# Patient Record
Sex: Male | Born: 1960 | Race: Black or African American | Hispanic: No | Marital: Single | Smoking: Current every day smoker
Health system: Southern US, Community
[De-identification: ages and names within clinical notes are randomized; demographics above are authoritative.]

## PROBLEM LIST (undated history)

## (undated) ENCOUNTER — Ambulatory Visit (HOSPITAL_COMMUNITY): Admission: EM

## (undated) DIAGNOSIS — M109 Gout, unspecified: Secondary | ICD-10-CM

## (undated) DIAGNOSIS — M1A9XX0 Chronic gout, unspecified, without tophus (tophi): Secondary | ICD-10-CM

## (undated) DIAGNOSIS — E119 Type 2 diabetes mellitus without complications: Secondary | ICD-10-CM

## (undated) DIAGNOSIS — K219 Gastro-esophageal reflux disease without esophagitis: Secondary | ICD-10-CM

## (undated) DIAGNOSIS — J439 Emphysema, unspecified: Secondary | ICD-10-CM

## (undated) DIAGNOSIS — N401 Enlarged prostate with lower urinary tract symptoms: Secondary | ICD-10-CM

## (undated) DIAGNOSIS — E785 Hyperlipidemia, unspecified: Secondary | ICD-10-CM

## (undated) DIAGNOSIS — I10 Essential (primary) hypertension: Secondary | ICD-10-CM

## (undated) DIAGNOSIS — N529 Male erectile dysfunction, unspecified: Secondary | ICD-10-CM

## (undated) DIAGNOSIS — K08109 Complete loss of teeth, unspecified cause, unspecified class: Secondary | ICD-10-CM

## (undated) DIAGNOSIS — Z86010 Personal history of colonic polyps: Secondary | ICD-10-CM

## (undated) HISTORY — DX: Hyperlipidemia, unspecified: E78.5

## (undated) HISTORY — PX: PROSTATE BIOPSY: SHX241

## (undated) HISTORY — DX: Personal history of colonic polyps: Z86.010

## (undated) HISTORY — PX: COLONOSCOPY: SHX174

## (undated) HISTORY — DX: Gastro-esophageal reflux disease without esophagitis: K21.9

## (undated) HISTORY — PX: BACK SURGERY: SHX140

---

## 1999-02-09 ENCOUNTER — Emergency Department (HOSPITAL_COMMUNITY): Admission: EM | Admit: 1999-02-09 | Discharge: 1999-02-09 | Payer: Self-pay | Admitting: Emergency Medicine

## 1999-11-14 ENCOUNTER — Emergency Department (HOSPITAL_COMMUNITY): Admission: EM | Admit: 1999-11-14 | Discharge: 1999-11-14 | Payer: Self-pay | Admitting: Emergency Medicine

## 2000-10-17 ENCOUNTER — Emergency Department (HOSPITAL_COMMUNITY): Admission: EM | Admit: 2000-10-17 | Discharge: 2000-10-17 | Payer: Self-pay | Admitting: *Deleted

## 2002-06-16 ENCOUNTER — Emergency Department (HOSPITAL_COMMUNITY): Admission: EM | Admit: 2002-06-16 | Discharge: 2002-06-16 | Payer: Self-pay | Admitting: Emergency Medicine

## 2002-07-13 ENCOUNTER — Emergency Department (HOSPITAL_COMMUNITY): Admission: EM | Admit: 2002-07-13 | Discharge: 2002-07-13 | Payer: Self-pay | Admitting: Emergency Medicine

## 2004-02-06 ENCOUNTER — Emergency Department (HOSPITAL_COMMUNITY): Admission: EM | Admit: 2004-02-06 | Discharge: 2004-02-06 | Payer: Self-pay | Admitting: Emergency Medicine

## 2006-04-29 ENCOUNTER — Encounter: Admission: RE | Admit: 2006-04-29 | Discharge: 2006-04-29 | Payer: Self-pay | Admitting: Orthopaedic Surgery

## 2006-08-28 ENCOUNTER — Inpatient Hospital Stay (HOSPITAL_COMMUNITY): Admission: RE | Admit: 2006-08-28 | Discharge: 2006-09-01 | Payer: Self-pay | Admitting: Orthopaedic Surgery

## 2006-09-03 HISTORY — PX: POSTERIOR FUSION LUMBAR SPINE: SUR632

## 2006-12-26 ENCOUNTER — Emergency Department (HOSPITAL_COMMUNITY): Admission: EM | Admit: 2006-12-26 | Discharge: 2006-12-26 | Payer: Self-pay | Admitting: Emergency Medicine

## 2008-09-26 ENCOUNTER — Inpatient Hospital Stay (HOSPITAL_COMMUNITY): Admission: EM | Admit: 2008-09-26 | Discharge: 2008-09-28 | Payer: Self-pay | Admitting: Emergency Medicine

## 2008-09-26 ENCOUNTER — Ambulatory Visit: Payer: Self-pay | Admitting: Internal Medicine

## 2008-09-27 ENCOUNTER — Encounter (INDEPENDENT_AMBULATORY_CARE_PROVIDER_SITE_OTHER): Payer: Self-pay | Admitting: Internal Medicine

## 2008-10-12 ENCOUNTER — Ambulatory Visit: Payer: Self-pay | Admitting: Internal Medicine

## 2008-10-18 ENCOUNTER — Ambulatory Visit: Payer: Self-pay | Admitting: *Deleted

## 2008-11-09 ENCOUNTER — Encounter (INDEPENDENT_AMBULATORY_CARE_PROVIDER_SITE_OTHER): Payer: Self-pay | Admitting: Family Medicine

## 2008-11-09 ENCOUNTER — Ambulatory Visit: Payer: Self-pay | Admitting: Internal Medicine

## 2008-11-09 LAB — CONVERTED CEMR LAB
Alkaline Phosphatase: 54 units/L (ref 39–117)
BUN: 17 mg/dL (ref 6–23)
Cholesterol: 196 mg/dL (ref 0–200)
Glucose, Bld: 113 mg/dL — ABNORMAL HIGH (ref 70–99)
HDL: 30 mg/dL — ABNORMAL LOW (ref >39)
LDL Cholesterol: 136 mg/dL — ABNORMAL HIGH (ref 0–99)
Total Bilirubin: 0.3 mg/dL (ref 0.3–1.2)
Triglycerides: 152 mg/dL — ABNORMAL HIGH (ref <150)
VLDL: 30 mg/dL (ref 0–40)

## 2008-12-13 ENCOUNTER — Ambulatory Visit: Payer: Self-pay | Admitting: Adult Health

## 2009-01-25 ENCOUNTER — Ambulatory Visit: Payer: Self-pay | Admitting: Internal Medicine

## 2009-01-25 ENCOUNTER — Encounter (INDEPENDENT_AMBULATORY_CARE_PROVIDER_SITE_OTHER): Payer: Self-pay | Admitting: Adult Health

## 2009-01-25 LAB — CONVERTED CEMR LAB
AST: 18 units/L (ref 0–37)
Albumin: 4.5 g/dL (ref 3.5–5.2)
Alkaline Phosphatase: 56 units/L (ref 39–117)
BUN: 18 mg/dL (ref 6–23)
Basophils Absolute: 0 10*3/uL (ref 0.0–0.1)
Basophils Relative: 0 % (ref 0–1)
Creatinine, Ser: 1.45 mg/dL (ref 0.40–1.50)
Eosinophils Relative: 2 % (ref 0–5)
Glucose, Bld: 107 mg/dL — ABNORMAL HIGH (ref 70–99)
HCT: 45.1 % (ref 39.0–52.0)
HDL: 27 mg/dL — ABNORMAL LOW (ref >39)
LDL Cholesterol: 93 mg/dL (ref 0–99)
Lymphocytes Relative: 36 % (ref 12–46)
MCHC: 32.2 g/dL (ref 30.0–36.0)
Platelets: 290 10*3/uL (ref 150–400)
Potassium: 4.2 meq/L (ref 3.5–5.3)
RDW: 14.7 % (ref 11.5–15.5)
TSH: 1.264 microintl units/mL (ref 0.350–4.500)
Total Bilirubin: 0.4 mg/dL (ref 0.3–1.2)
Total CHOL/HDL Ratio: 6
Triglycerides: 212 mg/dL — ABNORMAL HIGH (ref <150)
VLDL: 42 mg/dL — ABNORMAL HIGH (ref 0–40)

## 2009-02-24 ENCOUNTER — Ambulatory Visit: Payer: Self-pay | Admitting: Internal Medicine

## 2009-04-26 ENCOUNTER — Ambulatory Visit: Payer: Self-pay | Admitting: Internal Medicine

## 2009-06-06 ENCOUNTER — Encounter (INDEPENDENT_AMBULATORY_CARE_PROVIDER_SITE_OTHER): Payer: Self-pay | Admitting: Adult Health

## 2009-06-06 ENCOUNTER — Ambulatory Visit: Payer: Self-pay | Admitting: Internal Medicine

## 2009-06-06 LAB — CONVERTED CEMR LAB
BUN: 13 mg/dL (ref 6–23)
CO2: 21 meq/L (ref 19–32)
Chloride: 108 meq/L (ref 96–112)
Creatinine, Ser: 1.49 mg/dL (ref 0.40–1.50)
Glucose, Bld: 122 mg/dL — ABNORMAL HIGH (ref 70–99)
Potassium: 4.1 meq/L (ref 3.5–5.3)

## 2009-06-26 ENCOUNTER — Ambulatory Visit: Payer: Self-pay | Admitting: Internal Medicine

## 2009-06-26 ENCOUNTER — Encounter (INDEPENDENT_AMBULATORY_CARE_PROVIDER_SITE_OTHER): Payer: Self-pay | Admitting: Adult Health

## 2009-06-26 LAB — CONVERTED CEMR LAB
AST: 19 units/L (ref 0–37)
Albumin: 4.8 g/dL (ref 3.5–5.2)
Alkaline Phosphatase: 50 units/L (ref 39–117)
BUN: 24 mg/dL — ABNORMAL HIGH (ref 6–23)
Creatinine, Ser: 1.6 mg/dL — ABNORMAL HIGH (ref 0.40–1.50)
Glucose, Bld: 108 mg/dL — ABNORMAL HIGH (ref 70–99)
HDL: 27 mg/dL — ABNORMAL LOW (ref >39)
LDL Cholesterol: 107 mg/dL — ABNORMAL HIGH (ref 0–99)
Potassium: 4.6 meq/L (ref 3.5–5.3)
Total CHOL/HDL Ratio: 6
Triglycerides: 142 mg/dL (ref <150)

## 2009-08-03 ENCOUNTER — Encounter (INDEPENDENT_AMBULATORY_CARE_PROVIDER_SITE_OTHER): Payer: Self-pay | Admitting: Adult Health

## 2009-08-03 ENCOUNTER — Ambulatory Visit: Payer: Self-pay | Admitting: Internal Medicine

## 2009-08-03 LAB — CONVERTED CEMR LAB
CO2: 23 meq/L (ref 19–32)
Calcium: 9.9 mg/dL (ref 8.4–10.5)
Creatinine, Ser: 1.39 mg/dL (ref 0.40–1.50)
Glucose, Bld: 82 mg/dL (ref 70–99)
Sodium: 143 meq/L (ref 135–145)

## 2009-08-08 ENCOUNTER — Ambulatory Visit (HOSPITAL_COMMUNITY): Admission: RE | Admit: 2009-08-08 | Discharge: 2009-08-08 | Payer: Self-pay | Admitting: Internal Medicine

## 2009-09-26 ENCOUNTER — Ambulatory Visit: Payer: Self-pay | Admitting: Internal Medicine

## 2009-09-26 ENCOUNTER — Encounter (INDEPENDENT_AMBULATORY_CARE_PROVIDER_SITE_OTHER): Payer: Self-pay | Admitting: Adult Health

## 2009-09-26 LAB — CONVERTED CEMR LAB
ALT: 21 units/L (ref 0–53)
AST: 15 units/L (ref 0–37)
Creatinine, Ser: 1.37 mg/dL (ref 0.40–1.50)
LDL Cholesterol: 110 mg/dL — ABNORMAL HIGH (ref 0–99)
Sodium: 143 meq/L (ref 135–145)
Total Bilirubin: 0.4 mg/dL (ref 0.3–1.2)
Total CHOL/HDL Ratio: 4.8
Total Protein: 7.4 g/dL (ref 6.0–8.3)
VLDL: 16 mg/dL (ref 0–40)

## 2009-11-14 ENCOUNTER — Ambulatory Visit: Payer: Self-pay | Admitting: Family Medicine

## 2009-11-14 ENCOUNTER — Encounter (INDEPENDENT_AMBULATORY_CARE_PROVIDER_SITE_OTHER): Payer: Self-pay | Admitting: Adult Health

## 2009-11-14 LAB — CONVERTED CEMR LAB
ALT: 25 units/L (ref 0–53)
AST: 19 units/L (ref 0–37)
Albumin: 4.3 g/dL (ref 3.5–5.2)
Alkaline Phosphatase: 51 units/L (ref 39–117)
Chloride: 108 meq/L (ref 96–112)
HDL: 31 mg/dL — ABNORMAL LOW (ref >39)
LDL Cholesterol: 87 mg/dL (ref 0–99)
Potassium: 4.2 meq/L (ref 3.5–5.3)
Sodium: 144 meq/L (ref 135–145)
Total Protein: 7.1 g/dL (ref 6.0–8.3)

## 2009-11-16 ENCOUNTER — Encounter (INDEPENDENT_AMBULATORY_CARE_PROVIDER_SITE_OTHER): Payer: Self-pay | Admitting: *Deleted

## 2009-12-25 ENCOUNTER — Ambulatory Visit: Payer: Self-pay | Admitting: Internal Medicine

## 2010-03-20 ENCOUNTER — Ambulatory Visit: Payer: Self-pay | Admitting: Internal Medicine

## 2010-04-20 ENCOUNTER — Ambulatory Visit: Payer: Self-pay | Admitting: Internal Medicine

## 2010-04-20 ENCOUNTER — Encounter (INDEPENDENT_AMBULATORY_CARE_PROVIDER_SITE_OTHER): Payer: Self-pay | Admitting: Adult Health

## 2010-04-20 LAB — CONVERTED CEMR LAB
ALT: 28 units/L (ref 0–53)
Albumin: 4.5 g/dL (ref 3.5–5.2)
CO2: 23 meq/L (ref 19–32)
Chloride: 104 meq/L (ref 96–112)
Cholesterol: 163 mg/dL (ref 0–200)
Potassium: 5.3 meq/L (ref 3.5–5.3)
Sodium: 141 meq/L (ref 135–145)
Total Bilirubin: 0.4 mg/dL (ref 0.3–1.2)
Total Protein: 7.2 g/dL (ref 6.0–8.3)
VLDL: 35 mg/dL (ref 0–40)

## 2010-08-10 ENCOUNTER — Encounter (INDEPENDENT_AMBULATORY_CARE_PROVIDER_SITE_OTHER): Payer: Self-pay | Admitting: *Deleted

## 2010-08-10 LAB — CONVERTED CEMR LAB
Glucose, Bld: 96 mg/dL (ref 70–99)
Potassium: 4.5 meq/L (ref 3.5–5.3)
Sodium: 145 meq/L (ref 135–145)

## 2010-08-13 ENCOUNTER — Ambulatory Visit (HOSPITAL_COMMUNITY): Admission: RE | Admit: 2010-08-13 | Discharge: 2010-08-13 | Payer: Self-pay | Admitting: Family Medicine

## 2010-08-21 ENCOUNTER — Ambulatory Visit (HOSPITAL_COMMUNITY): Admission: RE | Admit: 2010-08-21 | Discharge: 2010-08-21 | Payer: Self-pay | Admitting: Internal Medicine

## 2010-11-27 NOTE — Letter (Signed)
Summary: *HSN Results Follow up  [facility]  [address] [place], [place] [address]   Phone: [phone]  Fax: [phone]      11/16/2009   [name] [address] [place], [place]  [address]   Dear  Mr. [name],                            ____S.Drinkard,FNP   __X__A. Chamkasem,FNP       ____B. McPherson,MD   ____V. Rankins,MD    ____E. Mulberry,MD    ____N. Daphine Deutscher, FNP  ____D. Reche Dixon, MD    ____K. Philipp Deputy, MD    ____Other     This letter is to inform you that your recent test(s):  _______Pap Smear    _______Lab Test     _______X-ray    ____X___ is within acceptable limits  _______ requires a medication change  _______ requires a follow-up lab visit  _______ requires a follow-up visit with your provider   Comments: Your cholesterol is improved, Great!  Continue Lipitor 40 mg every night.       _________________________________________________________ If you have any questions, please contact our office                     Sincerely,  Gaylyn Cheers RN [facility]

## 2011-01-09 LAB — CREATININE, SERUM
GFR calc Af Amer: >60 mL/min (ref >60)
GFR calc non Af Amer: 57 mL/min — ABNORMAL LOW (ref >60)

## 2011-03-12 NOTE — Consult Note (Signed)
NAME:  Henry Walker, Henry Walker NO.:  [id number]   MEDICAL RECORD NO.:  [id number]          PATIENT TYPE:  INP   LOCATION:  3704                         FACILITY:  [facility]   PHYSICIAN:  Bevelyn Buckles. Bensimhon, MDDATE OF BIRTH:  1961/06/14   DATE OF CONSULTATION:  09/27/2008  DATE OF DISCHARGE:                                 CONSULTATION   REQUESTING PHYSICIAN:  Dr. Marthann Schiller of the [facility] service.   REASON FOR CONSULTATION:  Chest pain.   HISTORY OF PRESENT ILLNESS:  Henry Walker is a very pleasant 50 year old  male with a history of hypertension, hyperlipidemia.  He denies any  history of known coronary artery disease.  He has never had a stress  test or cardiac catheterization.  He said about 2 weeks ago he developed  pain in his left chest.  This has been chronic for the past 2 weeks,  worse with movement.  It does not change with exertion.  He has not had  any associated shortness of breath, nausea or vomiting.  On Sunday he  notes the pain got much worse.  He said it is really a sharp pain.  This  lasted several days and he finally decided to come in for evaluation.  Once again, he has not had any associated symptoms with it.  On arrival  to the hospital he was markedly hypertensive with a blood pressure of  184/110.  His EKG showed sinus rhythm.  He had some J point elevation  throughout which was mostly suggestive of early repolarization.  There  are no acute ST-T wave abnormalities.   REVIEW OF SYSTEMS:  Notable for back pain.  He has not had any fevers,  chills, nausea or vomiting.  No focal neurologic deficits.  The  remainder review of systems is negative except for HPI and problem list.   PAST MEDICAL HISTORY:  1. Hypertension.  2. Hyperlipidemia.  3. Chronic back pain status post previous lumbar fusion.   MEDICATIONS:  None on admission.  Currently he is on heparin,  hydrochlorothiazide 12.5 a day, lisinopril 10 a day, Lopressor 25 b.i.d.  nitroglycerin paste, Zocor 40 mg a day and several p.r.n. medications.   ALLERGIES:  HE HAS NO KNOWN DRUG ALLERGIES.   SOCIAL HISTORY:  He is not married.  He is currently on disability due  to his back problems.  He smokes 1 pack a day of cigarettes for the past  30 years.  Denies illicit drugs or alcohol abuse.   FAMILY HISTORY:  There is no family history of premature coronary artery  disease.  His maternal grandparents do have heart disease.  His sister  had elevated cardiac enzymes and underwent cardiac catheterization which  showed normal coronary arteries.   On physical exam he is lying in bed no acute distress.  He says the  chest pain gets slightly worse when he sits up.  His respirations are  unlabored.  His blood pressure is currently 140/90.  He has a heart rate  of 51.  Temperature is 97.4.  Satting 98% on room air.  HEENT:  Normal.  NECK:  Supple.  No JVD.  Carotids are 2+ bilaterally without any bruits.  There is no lymphadenopathy or thyromegaly.  CARDIAC:  PMI is nondisplaced.  Regular rate and rhythm with an S4.  No  murmurs or rubs.  LUNGS:  Clear.  ABDOMEN:  Soft, nontender, nondistended.  No hepatosplenomegaly.  No  bruits.  No masses.  Good bowel sounds.  EXTREMITIES:  Warm with no cyanosis, clubbing or edema.  No rash.  NEURO:  Alert and oriented x3.  Cranial nerves II-XII are intact.  Moves  all 4 extremities without difficulty.  Affect is very pleasant.   EKG shows sinus bradycardia at a rate of 55.  There is mild J point  elevation.  No other ST-T wave abnormalities.  Axis and intervals are  normal.   Troponin is 0.01 x3.  MBs are negative.  Thyroid panel is normal.  Lipids show a total cholesterol of 254, triglycerides of 214, HDL is 16,  LDL of 195.  Sodium is 141, potassium is 3.7, BUN is 17, creatinine  1.42.  White count is 10.5, hemoglobin 14.8, platelets are 251.   ASSESSMENT/PLAN:  Henry Walker' chest pain is quite atypical.  I think  this is  likely musculoskeletal.  His echocardiogram is nondiagnostic.  I  discussed with him the risks and indication of stress testing versus  cardiac catheterization.  At this point he prefers to undertake a stress  Myoview.  We will schedule this for the morning.  He does have  significant cardiac risk factors including severe hypertension,  hyperlipidemia, ongoing tobacco abuse which we addressed.  He will need  aggressive risk factor modification to avoid significant cardiovascular  and renal issues down the road.  These are being addressed by the  hospitalist service.   I would be happy to see him back in the clinic down the road to help  address, but I think he would be best served by a getting primary care  physician on discharge.   DISPOSITION:  Will proceed with a stress Myoview in the morning.      Bevelyn Buckles. Bensimhon, MD  Electronically Signed     DRB/MEDQ  D:  09/27/2008  T:  09/27/2008  Job:  [id number]

## 2011-03-12 NOTE — H&P (Signed)
NAME:  Henry Walker, Henry Walker NO.:  [id number]   MEDICAL RECORD NO.:  [id number]          PATIENT TYPE:  INP   LOCATION:  3704                         FACILITY:  [facility]   PHYSICIAN:  Herbie Saxon, MDDATE OF BIRTH:  13-Dec-1960   DATE OF ADMISSION:  09/26/2008  DATE OF DISCHARGE:                              HISTORY & PHYSICAL   PRIMARY CARE PHYSICIAN:  Unassigned.   He is a full code.  There is no assigned health care power of attorney.   PRESENTING COMPLAINT:  Left-sided chest pain, 2 weeks.   HISTORY OF PRESENTING COMPLAINT:  This 50 year old African American male  has been experiencing intermittent left-sided chest pain, moderately  severe, 7-8/10 in severity, non-radiating.  The chest pain has increased  with exertion, movements, also increases with deep breathing.  The  patient denies any cough or palpitation.  He denies any diaphoresis or  dizziness.  No blurring of vision or headache, and there is no shortness  of breath.  The patient has not taken his blood pressure medications  since he lost his medical insurance a year ago.  On presentation to the  emergency room, his blood pressure was severely elevated at 184/110.  The patient denies any GI or genitourinary symptoms.  No neurological or  musculoskeletal symptoms.  No new skin rash.  The patient continued to  smoke 1 pack per day, which he has been doing for the past 30 years.   PAST MEDICAL HISTORY:  1. Chronic back pain status post back surgery 2 years ago.  2. Hypertension for the last 2 years.  Primary care physician used be      Dr. Sherryll Burger until he lost his medical insurance.   PAST SURGICAL HISTORY:  Back surgery.   FAMILY HISTORY:  Maternal grandparents had heart disease.   SOCIAL HISTORY:  He is single.  He had 2 children.  He smokes 1 pack per  day for more than 30 years.  He denies any history of illicit drug or  alcohol abuse.   REVIEW OF SYSTEMS:  The 14-systems are reviewed negative,  except for the  positive symptoms in the history of presenting complaints.  No cough, no  hemoptysis, or night sweats.  No wheezing.   MEDICATIONS:  None.   ALLERGIES:  No known drug allergies.   PHYSICAL EXAMINATION:  GENERAL:  On examination, he is a middle-aged  man, not in acute respiratory distress.  VITAL SIGNS:  Temperature is 98, pulse is 78, respiratory rate is 20,  and blood pressure 183/113.  HEENT:  Pupils are equal, reactive to light and accommodation.  Head is  atraumatic and normocephalic.  Mucous membranes are moist.  Extraocular  muscles are intact.  Oropharynx and nasopharynx are clear.  NECK:  Supple.  There is no thyromegaly.  No elevated JVD.  No carotid  bruit.  CHEST:  Clinically clear.  No rales or rhonchi.  HEART:  Heart sounds 1 and 2, regular rhythm.  Apex beats frequently in  midclavicular line.  ABDOMEN:  Benign.  CENTRAL NERVOUS SYSTEM:  No neurological deficit.  EXTREMITIES:  Peripheral pulses  present.  No pedal edema.   AVAILABLE LABS:  WBC is 10.5, hematocrit is 45, platelet count is 251.  Troponin less than 0.05, myoglobin 119.  EKG shows normal sinus rhythm  at 83 per minute.  Chest x-ray shows no acute changes.  Chemistry;  sodium is 141, potassium 3.7, chloride 108, bicarbonate 25, BUN 17,  creatinine 1.4, and glucose is 73.   ASSESSMENT:  1. Chest pain.  Rule out acute coronary syndrome.  2. Hypertensive urgency.  3. Poor medication compliance.  4. Tobacco abuse.   The patient is to be admitted to telemetry bed for 24 hours and review  need for telemetry afterwards.  Needs to be on bedrest.  Diet will be  heart healthy, low cholesterol, IV fluid saline lock.  We will cycle his  cardiac enzymes and EKG every 8 hours x3.  Obtain his thyroid function  test, lipid panel, homocysteine, PT, PTT, INR, and urinalysis.  He is to  be on DVT prophylaxis with heparin 5000 units subcu every 8 hours.  Give  1 nitro paste half inch q.6 hours, hold the  nitro paste between 6 a.m.  to 12 noon daily.  Morphine 2 mg IV every 6 hours p.r.n. for severe  chest pain, metoprolol 50 mg b.i.d., lisinopril 10 mg daily, Lopressor  2.5 mg IV every 6 hours p.r.n. if the blood pressure is greater than  170/110 or irregular heart rate greater than 119 per minute.  Counsel on  tobacco cessation and counsel on the need for improved medication  compliance.  Nicotine patch 21 mg a day, Ambien 5 mg nightly p.r.n.,  Protonix 40 mg IV daily.  Consider Cardiology evaluation if EKG shows  any gross abnormality or cardiac enzymes return positive.   ENCOUNTER TIME:  45 minutes.      Herbie Saxon, MD  Electronically Signed     MIO/MEDQ  D:  09/26/2008  T:  09/27/2008  Job:  [id number]

## 2011-03-12 NOTE — Discharge Summary (Signed)
NAME:  SYNCERE, EBLE NO.:  [id number]   MEDICAL RECORD NO.:  [id number]           PATIENT TYPE:   LOCATION:                                 FACILITY:   PHYSICIAN:  Eduard Clos, MDDATE OF BIRTH:  Mar 31, 1961   DATE OF ADMISSION:  DATE OF DISCHARGE:                               DISCHARGE SUMMARY   COURSE IN THE HOSPITAL:  A 50 year old male with history of hypertension  noncompliant with his medication secondary to financial issues,  presented with complaint of chest pain.  The patient also was found to  have high blood pressure.  The patient was started on antihypertensive  along with serial cardiac enzymes were followed which were within  acceptable limits.  The patient on admission had a chest x-ray, which  did not show any acute findings.  Cardiology consult was obtained as the  patient had persistent chest pain.  A 2-D echo was done which showed an  EF to be around 60%, with overall left ventricular systolic function  normal.  The patient underwent treadmill view per Cardiology which  showed EF of 54% with no ischemia, or infarct and as per Cardiology no  further cardiac workup at this time.  The patient also was started on  simvastatin for his hyperlipidemia.  The patient was advised about  adverse effects of simvastatin.  The patient was advised to be compliant  with medication and along with the advice to quit smoking.   PROCEDURES DURING THE STAY:  A 2-D echo on September 27, 2008, showed  overall left ventricular systolic function normal with EF of 60%.  Chest  x-ray on September 26, 2008, no acute findings.   FINAL DIAGNOSES:  1. Atypical chest pain, acute coronary syndrome ruled out, stress      Myoview negative.  2. Uncontrolled hypertension.  3. Hyperlipidemia.   DISCHARGE MEDICATIONS:  1. Hydrochlorothiazide 12.5 mg p.o. daily.  2. Lisinopril 10 mg p.o. daily.  3. Metoprolol 25 mg p.o. b.i.d.  4. Simvastatin 20 mg p.o. daily.  5.  Nicotine patch 21 mg per day for 6 weeks, followed by 14 mg per day      for 2 weeks, followed by 7 mg per day for 2 weeks, and stop.   The patient advised to follow with [facility] as scheduled on October 12, 2008, at 9:15 a.m.  The patient is to be on a cardiac healthy diet.  The patient advised strongly to follow and be compliant with his  medications.  The patient advised to quit smoking.      Eduard Clos, MD  Electronically Signed     ANK/MEDQ  D:  09/28/2008  T:  09/29/2008  Job:  [id number]

## 2011-03-15 NOTE — H&P (Signed)
NAME:  Henry Walker NO.:  [id number]   MEDICAL RECORD NO.:  [id number]            PATIENT TYPE:   LOCATION:                                 FACILITY:   PHYSICIAN:  Sharolyn Douglas, M.D.        DATE OF BIRTH:  [birth year]   DATE OF ADMISSION:  08/28/2006  DATE OF DISCHARGE:                                HISTORY & PHYSICAL   CHIEF COMPLAINT:  Low-back pain.   HISTORY OF PRESENT ILLNESS:  The patient is a 50 year old male who has been  having problems with his back dating back to a work injury in 2006.  He has  tried several methods of conservative treatment.  All, unfortunately,  without any lasting relief of his pain.  At this point, his pain is getting  increasingly worse, it is limiting his ability to function, it is limiting  his ability to participate in activities of daily living.  His quality of  life has suffered.  Secondary to all of the above, it was thought that the  best course of management would be L5-S1 posterior spinal fusion and  laminectomy.  Dr. Noel Gerold did discuss all the risks and benefits of the  procedure with him and I did as well.  The patient indicated understanding  and opted to proceed with surgery.   ALLERGIES:  None.   MEDICATIONS:  Lisinopril, zolpidem, and tramadol 4 to 6 hours.   PAST MEDICAL HISTORY:  Hypertension.   PAST SURGICAL HISTORY:  None.   SOCIAL HISTORY:  The patient is separated.  He smokes 1/2 pack of cigarettes  per day.  Denies alcohol use.  He has 2 children.   FAMILY HISTORY:  Noncontributory.   REVIEW OF SYSTEMS:  Negative.   PHYSICAL EXAMINATION:  VITAL SIGNS:  Blood pressure 114/60, respirations 20  and unlabored, pulse is 80 and regular.  GENERAL APPEARANCE:  The patient is a 50 year old black male who is alert  and oriented in no acute distress.  He is well nourished, well groomed,  appears his stated age, pleasant and cooperative to exam.  HEENT:  Head is normocephalic, atraumatic.  Pupils are equal,  round, and  reactive to light.  Extraocular movements are intact.  Nares are patent and  pharynx clear.  NECK:  Supple to palpation.  No lymphadenopathy, thyromegaly, or bruits  appreciated.  CHEST:  Clear to auscultation bilaterally.  No rales, rhonchi, stridor,  wheezes, or friction rales.  Breast is not deformed.  HEART:  S1, S2.  Regular rate and rhythm.  No murmurs, gallops, or rubs  noted.  ABDOMEN:  Soft to palpation.  Nontender, nondistended, no organomegaly  noted.  GU:  Not currently performed.  EXTREMITIES:  As per HPI.  SKIN:  Intact without any lesions or rashes.   IMPRESSION:  1. L5-S1 degenerative disk disease and spinal stenosis.  2. Hypertension.  Admitted to [facility] on August 28, 2006 for      an L5-S1 posterior spinal fusion.  The surgeon will be Dr. Sharolyn Douglas.   PRIMARY CARE PHYSICIAN:  Dr. Chrissie Noa  Lulu Riding Mahar, P.A.      Sharolyn Douglas, M.D.  Electronically Signed    CM/MEDQ  D:  08/22/2006  T:  08/23/2006  Job:  [id number]   cc:   Sharolyn Douglas, M.D.

## 2011-03-15 NOTE — Discharge Summary (Signed)
NAMEDALVIN, CLIPPER NO.:  [id number]   MEDICAL RECORD NO.:  [id number]          PATIENT TYPE:  INP   LOCATION:  5013                         FACILITY:  [facility]   PHYSICIAN:  Sharolyn Douglas, M.D.        DATE OF BIRTH:  Dec 21, 1960   DATE OF ADMISSION:  08/28/2006  DATE OF DISCHARGE:  09/01/2006                               DISCHARGE SUMMARY   ADMITTING DIAGNOSES:  1. L5-S1 degenerative disc disease.  2. Hypertension.   DISCHARGE DIAGNOSES:  1. Status post L5-S1 fusion doing well.  2. Postoperative blood loss anemia.  3. Hypertension.   PROCEDURE:  On August 28, 2006, patient was taken to the operating room  for an L5-S1 posterior spinal fusion with pedicle screws.  Surgeon was  Sharolyn Douglas, assistant was [employer], PA-C, anesthesia was general.   CONSULTS:  None.   LABS:  CBC with diff preoperatively was normal with the exception of  white count 11.2, absolute lymphs 4.7 and absolute monos 0.9.  Postoperatively, H&H were monitored x3 days and reached a low of 11.0  and 32.2 on August 30, 2006.  He was stable and did not require any  treatment of his anemia.  PT/INR and PTT preoperatively were normal,  complete metabolic panel preoperatively was normal with the exception of  glucose of 102.  Postoperatively, basic metabolic panel was monitored x2  days and on postop day one he had a glucose of 110, on postoperative day  two he had a creatinine of 1.6, otherwise normal.  UA preoperatively was  negative.  Blood typing preop was O-positive, antibody screen was  negative.   X-RAYS:  Lumbar x-rays on November 1 showed pedicle screws in L5-S1 in  good position, interbody fusion is also well positioned.   November 4, x-ray showed posterior fusion from L5-S1 with normal  alignment.   No EKG seen on the chart.   BRIEF HISTORY:  Patient is a 50 year old male who has been having  problems with his back for sometime now.  Unfortunately, he has failed  to respond  to every type of conservative treatment that was tried.  His  pain had been increasing, he was requiring pain medications on an  increasing frequency.  His pain was interfering with his activities of  daily living and quality of life.  Secondary to this as well as failure  to improve with the conservative treatments and his x-ray and MRI  findings, it was thought his best course of management would be a  decompression and fusion in L5-S1.  Risks and benefits of the procedures  were discussed at length by Dr. Noel Gerold as well as myself, he indicated  understanding and opted to proceed.   HOSPITAL COURSE:  On August 28, 2006, patient was admitted to the  hospital and taken to the operating room for the above-listed procedure.  He tolerated the procedure well without any intraoperative complications  and he was transferred to the recovery room in stable condition.   Postoperatively, routine orthopedic spine protocol was followed and he  progressed along very well.  Initially, he did  have some difficulty with  pain control, he was given Toradol for 24 hours through the IV and this  did improve his pain significantly.   Postoperatively, physical therapy and occupational therapy worked with  the patient on a daily basis on back precautions, brace use and  progressive ambulation program.  He progressed along well with them.   By September 01, 2006, the patient had met all orthopedic goals, he was  medically ready and stable for discharge home.   DISCHARGE DIAGNOSIS:  Patient is a 50 year old male status post L5-S1  fusion doing well.   ACTIVITY:  Daily ambulation program.  Brace on when he is up.  Back  precautions at all times.  No lifting heavier than five pounds.  Daily  dressing change.  Keep incision dry until all drainage stops.  Follow up  two weeks postoperatively with Dr. Noel Gerold.   MEDICATIONS ON DISCHARGE:  1. Vicodin for pain.  2. Robaxin for muscle spasm.  3. Multivitamin  daily.  4. Calcium daily.  5. Colace twice daily.  6. Laxatives as needed.  7. Avoid NSAIDs.   DIET:  Regular.   CONDITION ON DISCHARGE:  Stable and improved.   DISPOSITION:  Patient is being discharged to his home with his family's  assistance as well as home health physical therapy and occupational  therapy.      Verlin Fester, P.A.      Sharolyn Douglas, M.D.  Electronically Signed    CM/MEDQ  D:  11/05/2006  T:  11/05/2006  Job:  [id number]

## 2011-03-15 NOTE — Op Note (Signed)
NAME:  Henry Walker, Henry Walker NO.:  [id number]   MEDICAL RECORD NO.:  [id number]          PATIENT TYPE:  INP   LOCATION:  5013                         FACILITY:  [facility]   PHYSICIAN:  Sharolyn Douglas, M.D.        DATE OF BIRTH:  1961/03/15   DATE OF PROCEDURE:  08/28/2006  DATE OF DISCHARGE:                                 OPERATIVE REPORT   DIAGNOSES:  1. L5-S1 degenerative disk disease with chronic pain.  2. L5-S1 herniated nucleus pulposus.   PROCEDURES:  1. L5-S1 laminectomy and diskectomy.  2. L5-S1 transforaminal lumbar interbody fusion with placement of 9-mm      PEEK cage.  3. L5-S1 arthrodesis.  4. L5-S1 pedicle screw instrumentation using the Abbot Spine system.  5. Local autogenous bone graft supplemented with bone morphogenic protein.   SURGEON:  Sharolyn Douglas, M.D.   ASSISTANT:  Verlin Fester, P.A.   ANESTHESIA:  General endotracheal.   ESTIMATED BLOOD LOSS:  200 cc.   COMPLICATIONS:  None.   COUNTS:  Needle and sponge count correct.   INDICATIONS:  The patient is a pleasant, 50 year old male, who has had  persistent back pain unresponsive to all conservative treatment modalities.  His diskogram showed concordant pain at L5-S1 with a normal control period.  His MRI scan shows a herniated disk on the right at L5-S1.  Because of his  persistent symptoms unresponsive to other treatments, he now elects to  undergo L5-S1 decompression and fusion in hopes of improving his symptoms.  The risks, benefits and alternatives were reviewed.   DESCRIPTION OF PROCEDURES:  After informed consent, the patient was taken to  the operating room.  He underwent general endotracheal anesthesia without  difficulty and prophylactic IV antibiotics.  Carefully positioned prone on  the Wilson frame, all bony prominences padded.  Face and eyes protected at  all times.  Back prepped and draped in the usual sterile fashion.  Neural  monitoring was established in the form of lower  extremity EMGs and SSEPs.  The back was prepped and draped in the usual sterile fashion.  A midline  incision was made from L4 down to S1.  Dissection was carried sharply  through the deep fascia.  Subperiosteal exposure carried out to the tips of  the transverse processes of L5 and the sacral ala bilaterally.  Intraoperative x-ray was taken to confirm the levels.  Deep retractors were  placed.  We then placed pedicle screws at L5 and S1, using anatomic probing  technique.  Each pedicle was palpated with the ball-tipped feeler, and there  were no breaches.  We placed 6.5 x 40-mm screws in L5, and 7.5 x 30-mm  screws in the sacrum.  The bone quality was good and the screw purchase was  excellent.  We stimulated each pedicle screw with triggered EMGs and there  were no deleterious changes.  At this point, we turned our attention to  completing a laminectomy at L5-S1.  The inferior 2/3 of the L5 lamina and  spinous process was removed with a Leksell rongeur.  The bone was cleaned  and  morcellized for later autografting.  Ligamentum flavum was removed  piecemeal.  We completed a wide decompression.  We then gently mobilized the  thecal sac on the right side, identifying the herniated disk.  This appeared  to be displacing the S1 nerve root.  By mobilizing the root, we could enter  the disk space, and the disk herniation was decompressed.  At this time, we  elected to proceed with a transforaminal lumbar interbody fusion to restore  the foraminal height, further decompress the nerve roots, and also improve  the fusion rate and remove the painful disk.  The remaining facet joint of  L5-S1 on the right was osteotomized.  This allowed access to the  transforaminal window.  The exiting and traversing nerve roots were  identified and free-running EMGs were monitored.  We completed a radical  diskectomy across the contralateral side and the cartilaginous endplates  were scraped clean.  The disk space  was irrigated.  We then dilated the disk  space up to 9 mm using intervertebral spreaders.  The disk space was packed  with local bone graft obtained from the laminectomy, along with BMP sponges.  We then inserted a 9-mm PEEK cage into the interspace, tamped it anteriorly  and across the midline.  We had good distraction.  We then completed the  posterior spinal fusion by decorticating the transverse processes of L5 and  the sacral ala bilaterally.  The remaining local bone graft was packed into  the lateral gutters.  We then placed 40-mm titanium rods into the polyaxial  screwheads.  Compression was applied before shearing off the locking caps.  Hemostasis was achieved.  The wound was irrigated.  A deep Hemovac drain was  left in place.  The deep fascia was closed with a running #1 Vicryl suture.  The subcutaneous layer was closed with 0 Vicryl and 2-0 Vicryl, followed by  a running 3-0 subcuticular Vicryl suture on the skin edges.  Benzoin and  Steri-Strips were placed.  A sterile dressing was applied.  The patient was  turned supine, extubated without difficulty and transferred to recovery in  stable condition, able to move his upper and lower extremities.   It should be noted that my assistant, Verlin Fester, P.A., was present  throughout the procedure, including during the positioning, the exposure,  the decompression, the instrumentation, the TLIF, and she also assisted with  wound closure.      Sharolyn Douglas, M.D.  Electronically Signed     MC/MEDQ  D:  08/28/2006  T:  08/29/2006  Job:  [id number]

## 2011-07-30 LAB — COMPREHENSIVE METABOLIC PANEL
Albumin: 3.6 g/dL (ref 3.5–5.2)
BUN: 17 mg/dL (ref 6–23)
Calcium: 9.4 mg/dL (ref 8.4–10.5)
Creatinine, Ser: 1.42 mg/dL (ref 0.4–1.5)
Glucose, Bld: 73 mg/dL (ref 70–99)
Potassium: 3.7 mEq/L (ref 3.5–5.1)
Total Protein: 6.8 g/dL (ref 6.0–8.3)

## 2011-07-30 LAB — PROTIME-INR: INR: 1 (ref 0.00–1.49)

## 2011-07-30 LAB — DIFFERENTIAL
Basophils Relative: 0 % (ref 0–1)
Lymphocytes Relative: 36 % (ref 12–46)
Lymphs Abs: 3.8 10*3/uL (ref 0.7–4.0)
Monocytes Absolute: 0.9 10*3/uL (ref 0.1–1.0)
Monocytes Relative: 9 % (ref 3–12)
Neutro Abs: 5.6 10*3/uL (ref 1.7–7.7)
Neutrophils Relative %: 53 % (ref 43–77)

## 2011-07-30 LAB — APTT: aPTT: 32 seconds (ref 24–37)

## 2011-07-30 LAB — CBC
HCT: 45.8 % (ref 39.0–52.0)
Hemoglobin: 14.8 g/dL (ref 13.0–17.0)
MCHC: 32.3 g/dL (ref 30.0–36.0)
Platelets: 251 10*3/uL (ref 150–400)
RDW: 13.7 % (ref 11.5–15.5)

## 2011-07-30 LAB — POCT CARDIAC MARKERS
CKMB, poc: 2.3 ng/mL (ref 1.0–8.0)
CKMB, poc: 2.3 ng/mL (ref 1.0–8.0)
Myoglobin, poc: 119 ng/mL (ref 12–200)
Myoglobin, poc: 141 ng/mL (ref 12–200)
Troponin i, poc: <0.05 ng/mL (ref 0.00–0.09)

## 2011-07-30 LAB — URINALYSIS, MICROSCOPIC ONLY
Glucose, UA: NEGATIVE mg/dL
Hgb urine dipstick: NEGATIVE
Ketones, ur: NEGATIVE mg/dL
Protein, ur: NEGATIVE mg/dL

## 2011-07-30 LAB — CK TOTAL AND CKMB (NOT AT ARMC): CK, MB: 2.6 ng/mL (ref 0.3–4.0)

## 2011-08-02 LAB — TROPONIN I
Troponin I: 0.46 ng/mL — ABNORMAL HIGH (ref 0.00–0.06)
Troponin I: <0.01 ng/mL (ref 0.00–0.06)

## 2011-08-02 LAB — CK TOTAL AND CKMB (NOT AT ARMC)
CK, MB: 1.5 ng/mL (ref 0.3–4.0)
CK, MB: 1.7 ng/mL (ref 0.3–4.0)
CK, MB: 2.1 ng/mL (ref 0.3–4.0)
Total CK: 131 U/L (ref 7–232)

## 2011-08-02 LAB — LIPID PANEL
Cholesterol: 254 mg/dL — ABNORMAL HIGH (ref 0–200)
Total CHOL/HDL Ratio: 15.9 RATIO

## 2011-08-02 LAB — HOMOCYSTEINE: Homocysteine: 12.2 umol/L (ref 4.0–15.4)

## 2011-08-02 LAB — CARDIAC PANEL(CRET KIN+CKTOT+MB+TROPI)
CK, MB: 2.2 ng/mL (ref 0.3–4.0)
CK, MB: 2.3 ng/mL (ref 0.3–4.0)
Total CK: 162 U/L (ref 7–232)
Total CK: 169 U/L (ref 7–232)
Troponin I: 0.01 ng/mL (ref 0.00–0.06)
Troponin I: <0.01 ng/mL (ref 0.00–0.06)

## 2012-01-22 ENCOUNTER — Encounter (HOSPITAL_COMMUNITY): Payer: Self-pay

## 2012-01-22 ENCOUNTER — Other Ambulatory Visit: Payer: Self-pay

## 2012-01-22 ENCOUNTER — Emergency Department (HOSPITAL_COMMUNITY)
Admission: EM | Admit: 2012-01-22 | Discharge: 2012-01-22 | Disposition: A | Discharge status: Home / Self Care | Payer: Private Health Insurance - Indemnity | Attending: Emergency Medicine | Admitting: Emergency Medicine

## 2012-01-22 ENCOUNTER — Emergency Department (HOSPITAL_COMMUNITY): Payer: Private Health Insurance - Indemnity

## 2012-01-22 DIAGNOSIS — R0789 Other chest pain: Secondary | ICD-10-CM | POA: Insufficient documentation

## 2012-01-22 DIAGNOSIS — I10 Essential (primary) hypertension: Secondary | ICD-10-CM | POA: Insufficient documentation

## 2012-01-22 DIAGNOSIS — R0602 Shortness of breath: Secondary | ICD-10-CM | POA: Insufficient documentation

## 2012-01-22 DIAGNOSIS — R141 Gas pain: Secondary | ICD-10-CM | POA: Insufficient documentation

## 2012-01-22 DIAGNOSIS — R142 Eructation: Secondary | ICD-10-CM | POA: Insufficient documentation

## 2012-01-22 DIAGNOSIS — R5381 Other malaise: Secondary | ICD-10-CM | POA: Insufficient documentation

## 2012-01-22 DIAGNOSIS — E119 Type 2 diabetes mellitus without complications: Secondary | ICD-10-CM | POA: Insufficient documentation

## 2012-01-22 HISTORY — DX: Essential (primary) hypertension: I10

## 2012-01-22 LAB — CBC
HCT: 45.3 % (ref 39.0–52.0)
Hemoglobin: 15.6 g/dL (ref 13.0–17.0)
MCH: 29 pg (ref 26.0–34.0)
MCHC: 34.4 g/dL (ref 30.0–36.0)
MCV: 84.2 fL (ref 78.0–100.0)
RDW: 13.6 % (ref 11.5–15.5)

## 2012-01-22 LAB — BASIC METABOLIC PANEL
BUN: 15 mg/dL (ref 6–23)
Calcium: 9.3 mg/dL (ref 8.4–10.5)
Creatinine, Ser: 1.13 mg/dL (ref 0.50–1.35)
GFR calc Af Amer: 85 mL/min — ABNORMAL LOW (ref >90)

## 2012-01-22 LAB — DIFFERENTIAL
Basophils Absolute: 0 10*3/uL (ref 0.0–0.1)
Basophils Relative: 0 % (ref 0–1)
Eosinophils Absolute: 0.1 10*3/uL (ref 0.0–0.7)
Eosinophils Relative: 2 % (ref 0–5)
Monocytes Absolute: 0.6 10*3/uL (ref 0.1–1.0)
Monocytes Relative: 7 % (ref 3–12)

## 2012-01-22 LAB — D-DIMER, QUANTITATIVE: D-Dimer, Quant: 0.29 ug/mL-FEU (ref 0.00–0.48)

## 2012-01-22 MED ORDER — DIAZEPAM 5 MG PO TABS: 5.0000 mg | ORAL_TABLET | Freq: Three times a day (TID) | ORAL | Status: AC | PRN

## 2012-01-22 MED ORDER — BENAZEPRIL-HYDROCHLOROTHIAZIDE 10-12.5 MG PO TABS: 1.0000 | ORAL_TABLET | Freq: Every day | ORAL | Status: AC

## 2012-01-22 MED ORDER — HYDROCODONE-ACETAMINOPHEN 5-500 MG PO TABS: 1.0000 | ORAL_TABLET | Freq: Four times a day (QID) | ORAL | Status: AC | PRN

## 2012-01-22 MED ORDER — IBUPROFEN 600 MG PO TABS: 600.0000 mg | ORAL_TABLET | Freq: Four times a day (QID) | ORAL | Status: AC | PRN

## 2012-01-22 MED ORDER — ONDANSETRON HCL 4 MG/2ML IJ SOLN
4.0000 mg | Freq: Once | INTRAMUSCULAR | Status: AC
  Administered 2012-01-22: 4 mg via INTRAVENOUS
  Filled 2012-01-22: qty 2

## 2012-01-22 MED ORDER — MORPHINE SULFATE 4 MG/ML IJ SOLN
4.0000 mg | Freq: Once | INTRAMUSCULAR | Status: AC
  Administered 2012-01-22: 4 mg via INTRAVENOUS
  Filled 2012-01-22: qty 1

## 2012-01-22 NOTE — Discharge Instructions (Signed)
Your work up and labs here today did not show any emergent conditions to explain your pain. I think you may have an injured muscle which is causing your symptoms. Make sure to avoid heavy lifting for the next week. Take ibuprofen for pain. Vicodin as prescribed as needed for severe pain. Valium for muscle spasms. Take antihypertensives as prescribed for hight blood pressure. It is very important that you follow up in 3 days for recheck of your pain and in 2 weeks to recheck your blood pressure. Return if symptoms are worsening.   RESOURCE GUIDE  Dental Problems  Patients with Medicaid: [facility] [phone] [address]                                           [phone] [address] Phone:  [phone]                                                  Phone:  [phone]  If unable to pay or uninsured, contact:  [facility] or [facility]. to become qualified for the adult dental clinic.  Chronic Pain Problems Contact [facility]  [phone] Patients need to be referred by their primary care doctor.  Insufficient Money for Medicine Contact [employer]:  call "211" or [facility] 440-642-4627.  No Primary Care Doctor Call [facility]  [phone] Other agencies that provide inexpensive medical care    [facility]  [phone]    [facility]  [phone]    [facility]  [phone]    [facility]  [phone]    [facility]  [phone]    [facility]  [phone]  Psychological Services [facility]al Health  [phone] [facility] Services  [phone] [facility]   [phone] (emergency services [phone])  Substance Abuse Resources [facility]  2187362905 [facility] [phone] [facility] [place] [phone] Floydene Flock [phone] Residential & Outpatient Substance Abuse Program   [phone]  Abuse/Neglect [facility] Child Abuse Hotline 934-119-2887 [facility] Child Abuse Hotline (480)079-5060 (After Hours)  Emergency Shelter [facility] Ministries [phone]  M[facility] [phone] [facility] [phone]  MRSA Hotline #:   925 845 4625    [facility] Resources  [facility]     [employer]                          [facility] Dept. [address] [place]                       [address]      [address]  [address]  Cristobal Goldmann Phone:  [phone]                                   Phone:  [phone]                 Phone:  [phone]  [facility] Mental Health Phone:  [phone]  [facility] Child Abuse Hotline 916-375-4132 [phone])[phone] (After Hours)    Chest Pain (Nonspecific) It is often hard to give a specific diagnosis for the cause of chest pain. There is always a chance that your pain could be related to something serious, such as a heart attack or a blood clot in the lungs. You need to follow up with your caregiver for further evaluation. CAUSES   Heartburn.   Pneumonia or bronchitis.   Anxiety or stress.   Inflammation around your heart (pericarditis) or lung (pleuritis or pleurisy).   A blood clot in the lung.   A collapsed lung (pneumothorax). It can develop suddenly on its own (spontaneous pneumothorax) or from injury (trauma) to the chest.   Shingles infection (herpes zoster virus).  The chest wall is composed of bones, muscles, and cartilage. Any of these can be the source of the pain.  The bones can be bruised by injury.   The muscles or cartilage can be strained by coughing or overwork.   The cartilage can be affected by inflammation and become sore (costochondritis).  DIAGNOSIS  Lab tests or other  studies, such as X-rays, electrocardiography, stress testing, or cardiac imaging, may be needed to find the cause of your pain.  TREATMENT   Treatment depends on what may be causing your chest pain. Treatment may include:   Acid blockers for heartburn.   Anti-inflammatory medicine.   Pain medicine for inflammatory conditions.   Antibiotics if an infection is present.   You may be advised to change lifestyle habits. This includes stopping smoking and avoiding alcohol, caffeine, and chocolate.   You may be advised to keep your head raised (elevated) when sleeping. This reduces the chance of acid going backward from your stomach into your esophagus.   Most of the time, nonspecific chest pain will improve within 2 to 3 days with rest and mild pain medicine.  HOME CARE INSTRUCTIONS   If antibiotics were prescribed, take your antibiotics as directed. Finish them even if you start to feel better.   For the next few days, avoid physical activities that bring on chest pain. Continue physical activities as directed.   Do not smoke.   Avoid drinking alcohol.   Only take over-the-counter or prescription medicine for pain, discomfort, or fever as directed by your caregiver.   Follow your caregiver's suggestions for further testing if your chest pain does not go away.   Keep any follow-up appointments you made. If you do not go to an appointment, you could develop lasting (chronic) problems with pain. If there is any problem keeping an appointment, you must call to reschedule.  SEEK MEDICAL CARE IF:   You think you are having problems from the medicine you are taking. Read your medicine instructions carefully.   Your chest pain does not go away, even after treatment.   You develop a rash with blisters on your chest.  SEEK IMMEDIATE  MEDICAL CARE IF:   You have increased chest pain or pain that spreads to your arm, neck, jaw, back, or abdomen.   You develop shortness of breath, an  increasing cough, or you are coughing up blood.   You have severe back or abdominal pain, feel nauseous, or vomit.   You develop severe weakness, fainting, or chills.   You have a fever.  THIS IS AN EMERGENCY. Do not wait to see if the pain will go away. Get medical help at once. Call your local emergency services (911 in [place] Do not drive yourself to the hospital. MAKE SURE YOU:   Understand these instructions.   Will watch your condition.   Will get help right away if you are not doing well or get worse.  Document Released: 07/24/2005 Document Revised: 10/03/2011 Document Reviewed: 05/19/2008 [facility] Patient Information 2012 [place], [place].

## 2012-01-22 NOTE — ED Provider Notes (Signed)
History     CSN: [id number]  Arrival date & time 01/22/12  0845   First MD Initiated Contact with Patient 01/22/12 [phone]      Chief Complaint  Patient presents with  . Chest Pain    (Consider location/radiation/quality/duration/timing/severity/associated sxs/prior treatment) Patient is a 51 y.o. male presenting with chest pain. The history is provided by the patient.  Chest Pain The chest pain began 3 - 5 days ago. Chest pain occurs constantly. The chest pain is unchanged. The pain is associated with breathing (right arm movement). At its most intense, the pain is at 8/10. The pain is currently at 6/10. The quality of the pain is described as sharp. Primary symptoms include shortness of breath. Pertinent negatives for primary symptoms include no fever, no cough, no wheezing, no palpitations, no abdominal pain, no nausea, no vomiting and no dizziness.  Associated symptoms include weakness.  Pertinent negatives for associated symptoms include no numbness.   Pt states he works where he has to do a lot of lifting and pulling. Sates when pain started, about 4 days ago, he thought he pulled a muscle. He has been taking ibuprofen and tylenol with no relief. Today pain worsened and now has a shortness of breath. States pain worsened with breathing and movement of right arm.   Past Medical History  Diagnosis Date  . Diabetes mellitus   . Hypertension     Past Surgical History  Procedure Date  . Back surgery     No family history on file.  History  Substance Use Topics  . Smoking status: Current Everyday Smoker -- 0.5 packs/day    Types: Cigarettes  . Smokeless tobacco: Not on file  . Alcohol Use: No      Review of Systems  Constitutional: Negative for fever and chills.  HENT: Negative.   Respiratory: Positive for shortness of breath. Negative for cough and wheezing.   Cardiovascular: Positive for chest pain. Negative for palpitations and leg swelling.  Gastrointestinal:  Negative for nausea, vomiting and abdominal pain.  Genitourinary: Negative.   Musculoskeletal: Negative.   Neurological: Positive for weakness. Negative for dizziness, numbness and headaches.  Psychiatric/Behavioral: Negative.     Allergies  Review of patient's allergies indicates no known allergies.  Home Medications   Current Outpatient Rx  Name Route Sig Dispense Refill  . IBUPROFEN 200 MG PO TABS Oral Take 200 mg by mouth every 6 (six) hours as needed. For pain      BP 217/115  Pulse 57  Temp(Src) 97.2 F (36.2 C) (Oral)  Resp 18  SpO2 98%  Physical Exam  Nursing note and vitals reviewed. Constitutional: He is oriented to person, place, and time. He appears well-developed and well-nourished. He appears distressed.       Appears in pain, laying wight right arm above his head  HENT:  Head: Normocephalic.  Eyes: Conjunctivae are normal.  Neck: Neck supple.  Cardiovascular: Normal rate, regular rhythm and normal heart sounds.   Pulmonary/Chest: Effort normal and breath sounds normal. He has no wheezes. He has no rales.       Right sided chest tenderness. Chest appears normal with no signs of brusing, trauma, rash. Pain reproduced with right arm movement  Abdominal: Soft. Bowel sounds are normal. He exhibits distension.  Neurological: He is alert and oriented to person, place, and time. No cranial nerve deficit.  Skin: Skin is warm and dry.  Psychiatric: He has a normal mood and affect.    ED Course  Procedures (including critical care time)  Labs Reviewed  BASIC METABOLIC PANEL - Abnormal; Notable for the following:    Glucose, Bld 118 (*)    GFR calc non Af Amer 74 (*)    GFR calc Af Amer 85 (*)    All other components within normal limits  CBC  DIFFERENTIAL  D-DIMER, QUANTITATIVE  POCT I-STAT TROPONIN I   Dg Chest 2 View  01/22/2012  *RADIOLOGY REPORT*  Clinical Data: Right chest pain.  CHEST - 2 VIEW  Comparison: 09/26/2008.  Findings: Trachea is midline.   Heart size normal.  Lungs are clear. No pleural fluid.  IMPRESSION: No acute findings.  Original Report Authenticated By: Reyes Ivan, M.D.    Date: 01/22/2012  Rate: 55  Rhythm: sinus bradycardia  QRS Axis: normal  Intervals: normal  ST/T Wave abnormalities: nonspecific T wave changes  Conduction Disutrbances:none  Narrative Interpretation:   Old EKG Reviewed: unchanged   Pt with right sided chest pain, reproduced with right shoulder movement and palpation. Pain constant for last 3 days. 1 set of troponin negative. CXR negative.  Labs unremarkable. D-dimer negative. Pt is very hypertensive, creatinine good. Pt used to be on BP medication, but has not seen his PCP in "a long time." Will start on BP meds and refer to PCP. Suspect chest pain is musculoskeletal. Will treat with pain medications and muscle relaxants.    No diagnosis found.    MDM  Pt with right sided chest pain constat for 3 days. Reproducible with palpation and right arm movement. Negative Ddimer, not tachycardic, doubt PE. Negative set of cardiac enzymes, doubt ACS. Pt is low risk. CXR normal. Suspect musculoskeletal pain.  Will d/c home with pain meds and anti hypertensives.         Lottie Mussel, PA 01/22/12 1204

## 2012-01-22 NOTE — ED Provider Notes (Signed)
Medical screening examination/treatment/procedure(s) were performed by non-physician practitioner and as supervising physician I was immediately available for consultation/collaboration.   Lyanne Co, MD 01/22/12 [phone]

## 2012-01-22 NOTE — ED Notes (Addendum)
Pt reports that he began having right sided chest pain 4 days ago.  Pain also wraps around to his back.  Pt states he was using icy/hot cream and ibuprofen with some relief.  Pt states ibuprofen does relieve the pain, but when it wears off the pain comes back.  Pt has a hx of HTN.  Currently hypertensive, BP  194/94.  Pt states he has not had BP meds in a year due to no insurance.  Pt has a new job and can afford BP meds if given a prescription.

## 2012-01-22 NOTE — Progress Notes (Signed)
Met with patient to discuss health care resources per instruction from [place]. Patient confirmed he has [employer]. I provided patient with number for [facility] and advised him to contact the customer service number for [employer] located on his insurance card. Patient was appreciative and declined any further assistance.

## 2013-04-12 ENCOUNTER — Ambulatory Visit: Payer: Medicare Other | Attending: Family Medicine | Admitting: Internal Medicine

## 2013-04-12 ENCOUNTER — Encounter: Payer: Self-pay | Admitting: Internal Medicine

## 2013-04-12 VITALS — BP 189/130 | HR 65 | Temp 98.4°F | Resp 16 | Ht 66.0 in | Wt 171.0 lb

## 2013-04-12 DIAGNOSIS — I10 Essential (primary) hypertension: Secondary | ICD-10-CM

## 2013-04-12 DIAGNOSIS — E119 Type 2 diabetes mellitus without complications: Secondary | ICD-10-CM

## 2013-04-12 LAB — GLUCOSE, POCT (MANUAL RESULT ENTRY): POC Glucose: 102 mg/dl — AB (ref 70–99)

## 2013-04-12 MED ORDER — AMLODIPINE BESYLATE 10 MG PO TABS: 10.0000 mg | ORAL_TABLET | Freq: Every day | ORAL | Status: DC

## 2013-04-12 MED ORDER — CLONIDINE HCL 0.1 MG PO TABS
0.1000 mg | ORAL_TABLET | Freq: Once | ORAL | Status: AC
  Administered 2013-04-12: 0.1 mg via ORAL

## 2013-04-12 MED ORDER — HYDROCHLOROTHIAZIDE 12.5 MG PO CAPS: 12.5000 mg | ORAL_CAPSULE | Freq: Every day | ORAL | Status: DC

## 2013-04-12 NOTE — Progress Notes (Signed)
Patient ID: TEGH FRANEK, male   DOB: Sep 08, 1961, 52 y.o.   MRN: [id number] Patient Demographics  Ronin Rehfeldt, is a 52 y.o. male  WUJ:[id number]  NFA:[id number]  DOB - 1961-08-06  Chief Complaint  Patient presents with  . Establish Care        Subjective:   Cristal Howatt today is here to establish primary care. Patient has a  Past Medical History  Diagnosis Date  . Diabetes mellitus   . Hypertension   . Patient reportedly has a past medical history of hypertension, diabetes, dyslipidemia and has not been compliant with medications, in fact has not been taking any of his medications for the past 3 years, is here to establish care. He currently has no complaints. Patient has also has No headache, No chest pain, No abdominal pain,No Nausea, No new weakness tingling or numbness, No Cough or SOB.  Objective:    Filed Vitals:   04/12/13 1303  BP: 178/108  Pulse: 65  Temp: 98.4 F (36.9 C)  Resp: 16  Height: 5\' 6"  (1.676 m)  Weight: 171 lb (77.565 kg)  SpO2: 97%     ALLERGIES:  No Known Allergies  PAST MEDICAL HISTORY: Past Medical History  Diagnosis Date  . Diabetes mellitus   . Hypertension     PAST SURGICAL HISTORY: Past Surgical History  Procedure Laterality Date  . Back surgery      FAMILY HISTORY: SISTER. has diabetes  MEDICATIONS AT HOME: Prior to Admission medications   Medication Sig Start Date End Date Taking? Authorizing Provider  amLODipine (NORVASC) 10 MG tablet Take 1 tablet (10 mg total) by mouth daily. 04/12/13   Shanker Levora Dredge, MD  hydrochlorothiazide (MICROZIDE) 12.5 MG capsule Take 1 capsule (12.5 mg total) by mouth daily. 04/12/13   Shanker Levora Dredge, MD  ibuprofen (ADVIL,MOTRIN) 200 MG tablet Take 200 mg by mouth every 6 (six) hours as needed. For pain    Historical Provider, MD    SOCIAL HISTORY:   reports that he has been smoking Cigarettes.  He has been smoking about 0.50 packs per day. He does not have any smokeless  tobacco history on file. He reports that he does not drink alcohol or use illicit drugs.  REVIEW OF SYSTEMS:  Constitutional:   No   Fevers, chills, fatigue.  HEENT:    No headaches, Sore throat,   Cardio-vascular: No chest pain,  Orthopnea, swelling in lower extremities, anasarca, palpitations  GI:  No abdominal pain, nausea, vomiting, diarrhea  Resp: No shortness of breath,  No coughing up of blood.No cough.No wheezing.  Skin:  no rash or lesions.  GU:  no dysuria, change in color of urine, no urgency or frequency.  No flank pain.  Musculoskeletal: No joint pain or swelling.  No decreased range of motion.  No back pain.  Psych: No change in mood or affect. No depression or anxiety.  No memory loss.   Exam  General appearance :Awake, alert, not in any distress. Speech Clear. Not toxic Looking HEENT: Atraumatic and Normocephalic, pupils equally reactive to light and accomodation Neck: supple, no JVD. No cervical lymphadenopathy.  Chest:Good air entry bilaterally, no added sounds  CVS: S1 S2 regular, no murmurs.  Abdomen: Bowel sounds present, Non tender and not distended with no gaurding, rigidity or rebound. Extremities: B/L Lower Ext shows no edema, both legs are warm to touch Neurology: Awake alert, and oriented X 3, CN II-XII intact, Non focal Skin:No Rash Wounds:N/A    Data Review  CBC No results found for this basename: WBC, HGB, HCT, PLT, MCV, MCH, MCHC, RDW, NEUTRABS, LYMPHSABS, MONOABS, EOSABS, BASOSABS, BANDABS, BANDSABD,  in the last 168 hours  Chemistries   No results found for this basename: NA, K, CL, CO2, GLUCOSE, BUN, CREATININE, GFRCGP, CALCIUM, MG, AST, ALT, ALKPHOS, BILITOT,  in the last 168 hours ------------------------------------------------------------------------------------------------------------------ No results found for this basename: HGBA1C,  in the last 72  hours ------------------------------------------------------------------------------------------------------------------ No results found for this basename: CHOL, HDL, LDLCALC, TRIG, CHOLHDL, LDLDIRECT,  in the last 72 hours ------------------------------------------------------------------------------------------------------------------ No results found for this basename: TSH, T4TOTAL, FREET3, T3FREE, THYROIDAB,  in the last 72 hours ------------------------------------------------------------------------------------------------------------------ No results found for this basename: VITAMINB12, FOLATE, FERRITIN, TIBC, IRON, RETICCTPCT,  in the last 72 hours  Coagulation profile  No results found for this basename: INR, PROTIME,  in the last 168 hours    Assessment & Plan   Uncontrolled hypertension - Give 0.1 mg of clonidine now while in the clinic - Start 10 mg of amlodipine and 12.5 mg of HCTZ - Return to clinic in one week for BP check and lab followup  Diabetes - Claims he is intolerant to metformin because of GI side effects - Will check A1c and then decide on what medication to place patient on  Dyslipidemia - Will check LFTs and lipid now and then decide if he needs lipid lowering therapy  General health maintenance - Await basic labs first before beginning with age appropriate cancer screening and vaccinations. Will likely need this to be addressed at next visitS   Return to clinic in one week for BP check and lab followup

## 2013-04-12 NOTE — Progress Notes (Signed)
Patient states here to establish care Has history of DM and HTN Has not taken any medications in almost 3 years

## 2013-04-16 ENCOUNTER — Ambulatory Visit: Payer: Medicare Other

## 2013-04-19 ENCOUNTER — Ambulatory Visit: Payer: Medicare Other | Attending: Family Medicine | Admitting: Internal Medicine

## 2013-04-19 VITALS — BP 198/105 | HR 81 | Temp 98.1°F | Resp 16 | Ht 66.0 in | Wt 170.4 lb

## 2013-04-19 DIAGNOSIS — I10 Essential (primary) hypertension: Secondary | ICD-10-CM | POA: Insufficient documentation

## 2013-04-19 LAB — POCT GLYCOSYLATED HEMOGLOBIN (HGB A1C): Hemoglobin A1C: 5.8

## 2013-04-19 LAB — LIPID PANEL
Cholesterol: 255 mg/dL — ABNORMAL HIGH (ref 0–200)
VLDL: 31 mg/dL (ref 0–40)

## 2013-04-19 MED ORDER — HYDRALAZINE HCL 25 MG PO TABS: 25.0000 mg | ORAL_TABLET | Freq: Three times a day (TID) | ORAL | Status: DC

## 2013-04-19 NOTE — Progress Notes (Signed)
Patient here for follow up on his blood pressure 

## 2013-04-19 NOTE — Patient Instructions (Addendum)

## 2013-04-19 NOTE — Progress Notes (Signed)
Patient ID: Henry Walker, male   DOB: [birth year], 52 y.o.   MRN: [id number]  CC: follow up for blood pressure  HPI: 52 year old male with past medical history of hypertension and diet-controlled diabetes who presented to our clinic for followup as he was started on new blood pressure medications. Patient reports being compliant with blood pressure medications. No chest pain, palpitations or shortness of breath. No headaches or lightheadedness or blurry vision. No polydipsia or polyphagia.  No Known Allergies Past Medical History  Diagnosis Date  . Diabetes mellitus   . Hypertension    Current Outpatient Prescriptions on File Prior to Visit  Medication Sig Dispense Refill  . amLODipine (NORVASC) 10 MG tablet Take 1 tablet (10 mg total) by mouth daily.  30 tablet  3  . hydrochlorothiazide (MICROZIDE) 12.5 MG capsule Take 1 capsule (12.5 mg total) by mouth daily.  30 capsule  3  . ibuprofen (ADVIL,MOTRIN) 200 MG tablet Take 200 mg by mouth every 6 (six) hours as needed. For pain       No current facility-administered medications on file prior to visit.   Family medical history significant for HTN, HLD  History   Social History  . Marital Status: Divorced    Spouse Name: N/A    Number of Children: N/A  . Years of Education: N/A   Occupational History  . Not on file.   Social History Main Topics  . Smoking status: Current Every Day Smoker -- 0.50 packs/day    Types: Cigarettes  . Smokeless tobacco: Not on file  . Alcohol Use: No  . Drug Use: No  . Sexually Active:    Other Topics Concern  . Not on file   Social History Narrative  . No narrative on file    Review of Systems  Constitutional: Negative for fever, chills, diaphoresis, activity change, appetite change and fatigue.  HENT: Negative for ear pain, nosebleeds, congestion, facial swelling, rhinorrhea, neck pain, neck stiffness and ear discharge.   Eyes: Negative for pain, discharge, redness, itching and visual  disturbance.  Respiratory: Negative for cough, choking, chest tightness, shortness of breath, wheezing and stridor.   Cardiovascular: Negative for chest pain, palpitations and leg swelling.  Gastrointestinal: Negative for abdominal distention.  Genitourinary: Negative for dysuria, urgency, frequency, hematuria, flank pain, decreased urine volume, difficulty urinating and dyspareunia.  Musculoskeletal: Negative for back pain, joint swelling, arthralgias and gait problem.  Neurological: Negative for dizziness, tremors, seizures, syncope, facial asymmetry, speech difficulty, weakness, light-headedness, numbness and headaches.  Hematological: Negative for adenopathy. Does not bruise/bleed easily.  Psychiatric/Behavioral: Negative for hallucinations, behavioral problems, confusion, dysphoric mood, decreased concentration and agitation.    Objective:   Filed Vitals:   04/19/13 1258  BP: 198/105  Pulse: 81  Temp: 98.1 F (36.7 C)  Resp: 16    Physical Exam  Constitutional: Appears well-developed and well-nourished. No distress.  HENT: Normocephalic. External right and left ear normal. Oropharynx is clear and moist.  Eyes: Conjunctivae and EOM are normal. PERRLA, no scleral icterus.  Neck: Normal ROM. Neck supple. No JVD. No tracheal deviation. No thyromegaly.  CVS: RRR, S1/S2 +, no murmurs, no gallops, no carotid bruit.  Pulmonary: Effort and breath sounds normal, no stridor, rhonchi, wheezes, rales.  Abdominal: Soft. BS +,  no distension, tenderness, rebound or guarding.  Musculoskeletal: Normal range of motion. No edema and no tenderness.  Lymphadenopathy: No lymphadenopathy noted, cervical, inguinal. Neuro: Alert. Normal reflexes, muscle tone coordination. No cranial nerve deficit. Skin: Skin is warm  and dry. No rash noted. Not diaphoretic. No erythema. No pallor.  Psychiatric: Normal mood and affect. Behavior, judgment, thought content normal.   Lab Results  Component Value Date    WBC 8.4 01/22/2012   HGB 15.6 01/22/2012   HCT 45.3 01/22/2012   MCV 84.2 01/22/2012   PLT 217 01/22/2012   Lab Results  Component Value Date   CREATININE 1.13 01/22/2012   BUN 15 01/22/2012   NA 142 01/22/2012   K 3.6 01/22/2012   CL 106 01/22/2012   CO2 24 01/22/2012    No results found for this basename: HGBA1C   Lipid Panel     Component Value Date/Time   CHOL 163 04/20/2010 2040   TRIG 174* 04/20/2010 2040   HDL 32* 04/20/2010 2040   CHOLHDL 5.1 Ratio 04/20/2010 2040   VLDL 35 04/20/2010 2040   LDLCALC 96 04/20/2010 2040       Assessment and plan:   Patient Active Problem List   Diagnosis Date Noted  . HTN (hypertension) 04/12/2013    Priority: High - BP 198/105; blood pressure still high even with 2 antihypertensive medication  - We will add hydralazine 25 mg every 8 hours and see if blood pressure is controlled better  - We have discussed target BP range - I have advised pt to check BP regularly and to call us back if the numbers are higher than 140/90 - discussed the importance of compliance with medical therapy and diet  - check A1c, TSH and lipid panel today

## 2013-04-20 MED ORDER — OMEGA-3 FATTY ACIDS 1000 MG PO CAPS: 2.0000 g | ORAL_CAPSULE | Freq: Every day | ORAL | Status: DC

## 2013-04-20 NOTE — Progress Notes (Signed)
Patient ID: IMRI LOR, male   DOB: 07-08-1961, 52 y.o.   MRN: [id number] I have reviewed the blood work for [name]. His LDL and TGC are above the goal. We will start him on omega 3 fish oil. Patient will followup in our clinic per scheduled appointment.  Manson Passey [facility] [phone]

## 2013-04-20 NOTE — Addendum Note (Signed)
Addended by: Alison Murray on: 04/20/2013 09:31 AM   Modules accepted: Orders

## 2013-05-10 ENCOUNTER — Ambulatory Visit: Payer: Medicare Other | Attending: Family Medicine | Admitting: Internal Medicine

## 2013-05-10 VITALS — BP 159/98 | HR 98 | Temp 98.8°F | Resp 18 | Ht 66.0 in | Wt 170.0 lb

## 2013-05-10 DIAGNOSIS — Z79899 Other long term (current) drug therapy: Secondary | ICD-10-CM | POA: Insufficient documentation

## 2013-05-10 DIAGNOSIS — I10 Essential (primary) hypertension: Secondary | ICD-10-CM | POA: Insufficient documentation

## 2013-05-10 DIAGNOSIS — F17200 Nicotine dependence, unspecified, uncomplicated: Secondary | ICD-10-CM | POA: Insufficient documentation

## 2013-05-10 DIAGNOSIS — E785 Hyperlipidemia, unspecified: Secondary | ICD-10-CM | POA: Insufficient documentation

## 2013-05-10 LAB — CBC
MCV: 81.3 fL (ref 78.0–100.0)
Platelets: 360 10*3/uL (ref 150–400)
RBC: 5.68 MIL/uL (ref 4.22–5.81)
WBC: 12.8 10*3/uL — ABNORMAL HIGH (ref 4.0–10.5)

## 2013-05-10 MED ORDER — ATORVASTATIN CALCIUM 10 MG PO TABS: 10.0000 mg | ORAL_TABLET | Freq: Every day | ORAL | Status: DC

## 2013-05-10 MED ORDER — METOPROLOL TARTRATE 50 MG PO TABS: 50.0000 mg | ORAL_TABLET | Freq: Two times a day (BID) | ORAL | Status: DC

## 2013-05-10 NOTE — Progress Notes (Signed)
Patient ID: Henry Walker, male   DOB: 1960/12/29, 52 y.o.   MRN: [id number] Patient Demographics  Henry Walker, is a 52 y.o. male  WUJ:[id number]  [id number]  DOB - 1961/02/07  Chief Complaint  Patient presents with  . Follow-up  . Hypertension        Subjective:   Henry Walker with History of hypertension, dyslipidemia per blood work done recently is here for followup on his blood pressure, he was recently started on blood pressure medications along with a diuretic, no subjective complaints, he has had a back surgery and wishes to get a [employer] disability sticker. However no lower M.D. weakness, in no distress, was observed walking into the clinic without any distress or discomfort with normal gait.  Denies any subjective complaints except as above, no active headache, no chest abdominal pain at this time, not short of breath. No focal weakness which is new.   Objective:    Patient Active Problem List   Diagnosis Date Noted  . HTN (hypertension) 04/12/2013     Filed Vitals:   05/10/13 1316  BP: 159/98  Pulse: 98  Temp: 98.8 F (37.1 C)  TempSrc: Oral  Resp: 18  Height: 5\' 6"  (1.676 m)  Weight: 170 lb (77.111 kg)  SpO2: 97%     Exam   Awake Alert, Oriented X 3, No new F.N deficits, Normal affect [place].AT,PERRAL Supple Neck,No JVD, No cervical lymphadenopathy appriciated.  Symmetrical Chest wall movement, Good air movement bilaterally, CTAB RRR,No Gallops,Rubs or new Murmurs, No Parasternal Heave +ve B.Sounds, Abd Soft, Non tender, No organomegaly appriciated, No rebound - guarding or rigidity. No Cyanosis, Clubbing or edema, No new Rash or bruise     Data Review   CBC No results found for this basename: WBC, HGB, HCT, PLT, MCV, MCH, MCHC, RDW, NEUTRABS, LYMPHSABS, MONOABS, EOSABS, BASOSABS, BANDABS, BANDSABD,  in the last 168 hours  Chemistries   No results found for this basename: NA, K, CL, CO2, GLUCOSE, BUN, CREATININE, GFRCGP, CALCIUM, MG, AST,  ALT, ALKPHOS, BILITOT,  in the last 168 hours ------------------------------------------------------------------------------------------------------------------ No results found for this basename: HGBA1C,  in the last 72 hours ------------------------------------------------------------------------------------------------------------------ No results found for this basename: CHOL, HDL, LDLCALC, TRIG, CHOLHDL, LDLDIRECT,  in the last 72 hours ------------------------------------------------------------------------------------------------------------------ No results found for this basename: TSH, T4TOTAL, FREET3, T3FREE, THYROIDAB,  in the last 72 hours ------------------------------------------------------------------------------------------------------------------ No results found for this basename: VITAMINB12, FOLATE, FERRITIN, TIBC, IRON, RETICCTPCT,  in the last 72 hours  Coagulation profile  No results found for this basename: INR, PROTIME,  in the last 168 hours     Prior to Admission medications   Medication Sig Start Date End Date Taking? Authorizing Provider  amLODipine (NORVASC) 10 MG tablet Take 1 tablet (10 mg total) by mouth daily. 04/12/13  Yes Shanker Levora Dredge, MD  fish oil-omega-3 fatty acids 1000 MG capsule Take 2 capsules (2 g total) by mouth daily. 04/20/13  Yes Alison Murray, MD  hydrALAZINE (APRESOLINE) 25 MG tablet Take 1 tablet (25 mg total) by mouth 3 (three) times daily. 04/19/13  Yes Alison Murray, MD  hydrochlorothiazide (MICROZIDE) 12.5 MG capsule Take 1 capsule (12.5 mg total) by mouth daily. 04/12/13  Yes Shanker Levora Dredge, MD  metoprolol (LOPRESSOR) 50 MG tablet Take 1 tablet (50 mg total) by mouth 2 (two) times daily. 05/10/13   Henry Sea, MD     Assessment & Plan   HTN in  poor control. Continue home regimen have added Lopressor.  We'll get him back in 2 weeks for followup, I do not see a BMP for a while will order baseline BMP today along with  CBC.  Dyslipidemia found on blood work last visit. Placed on statin, counseled on diet and exercise.   Smoking. Counseled to quit smoking   Note patient had back surgery and wanted a disability sticker from [facility], however he's in no distress he was walking in the hallway without any discomfort, no lower extremity weakness. I do not think he requires a disability sticker.     Routine health maintenance.  Screening labs. TSH, A1c and lipid panel noted, CBC BMP ordered. Come back in 2 weeks for followup  Colonoscopy -had unremarkable colonoscopy at 49, he went to [facility] per patient, next colonoscopy due at 59 years  Immunizations tetanus shot next visit, clinic out of tetanus shot       Henry Walker M.D on 05/10/2013 at 1:29 PM

## 2013-05-10 NOTE — Progress Notes (Signed)
Patient presents for FU for hypertension and review of meds; pt also states he wants to know about handicap sticker for car due to disability.

## 2013-05-11 LAB — BASIC METABOLIC PANEL
CO2: 25 mEq/L (ref 19–32)
Chloride: 101 mEq/L (ref 96–112)
Creat: 1.25 mg/dL (ref 0.50–1.35)

## 2013-05-12 ENCOUNTER — Telehealth: Payer: Self-pay | Admitting: *Deleted

## 2013-05-12 NOTE — Telephone Encounter (Signed)
05/12/13 Unable to reach patient at  Number provided.  P.Liliana Dang,RN BSN [facility]

## 2013-05-12 NOTE — Progress Notes (Signed)
Quick Note:  Have patient come in for high WBC workup in 5-7 days ______

## 2013-05-17 ENCOUNTER — Encounter: Payer: Self-pay | Admitting: Internal Medicine

## 2013-05-17 ENCOUNTER — Ambulatory Visit: Payer: Medicare Other | Attending: Internal Medicine | Admitting: Internal Medicine

## 2013-05-17 VITALS — BP 147/98 | HR 72 | Temp 98.4°F | Resp 16 | Ht 67.0 in | Wt 171.4 lb

## 2013-05-17 DIAGNOSIS — I10 Essential (primary) hypertension: Secondary | ICD-10-CM | POA: Insufficient documentation

## 2013-05-17 DIAGNOSIS — Z79899 Other long term (current) drug therapy: Secondary | ICD-10-CM | POA: Insufficient documentation

## 2013-05-17 DIAGNOSIS — D72829 Elevated white blood cell count, unspecified: Secondary | ICD-10-CM

## 2013-05-17 MED ORDER — HYDRALAZINE HCL 25 MG PO TABS: 25.0000 mg | ORAL_TABLET | Freq: Three times a day (TID) | ORAL | Status: DC

## 2013-05-17 NOTE — Progress Notes (Signed)
Pt here for f/u post last week. Prescribed Lopressor 50 mg. Tolerating well. BP 147/98. Denies CP

## 2013-05-17 NOTE — Progress Notes (Signed)
Patient ID: Henry Walker, male   DOB: 08-05-1961, 52 y.o.   MRN: [id number]   HPI: This is a 52 year old male comes in for followup on blood work and blood pressure. He has no complaints. He was recently started on metoprolol in addition to other blood pressure medications. No Known Allergies Past Medical History  Diagnosis Date  . Hypertension    Current Outpatient Prescriptions on File Prior to Visit  Medication Sig Dispense Refill  . amLODipine (NORVASC) 10 MG tablet Take 1 tablet (10 mg total) by mouth daily.  30 tablet  3  . atorvastatin (LIPITOR) 10 MG tablet Take 1 tablet (10 mg total) by mouth daily.  30 tablet  3  . hydrochlorothiazide (MICROZIDE) 12.5 MG capsule Take 1 capsule (12.5 mg total) by mouth daily.  30 capsule  3  . metoprolol (LOPRESSOR) 50 MG tablet  Also taking hydralazine 25 mg 3 times a day  Take 1 tablet (50 mg total) by mouth 2 (two) times daily.  60 tablet  3   No current facility-administered medications on file prior to visit.   History reviewed. No pertinent family history. History   Social History  . Marital Status: Divorced    Spouse Name: N/A    Number of Children: N/A  . Years of Education: N/A   Occupational History  . Not on file.   Social History Main Topics  . Smoking status: Current Every Day Smoker -- 0.50 packs/day    Types: Cigarettes  . Smokeless tobacco: Not on file  . Alcohol Use: No  . Drug Use: No  . Sexually Active:    Other Topics Concern  . Not on file   Social History Narrative  . No narrative on file    Review of Systems ______ Constitutional: Negative for fever, chills, diaphoresis, activity change, appetite change and fatigue. ____ HENT: Negative for ear pain, nosebleeds, congestion, facial swelling, rhinorrhea, neck pain, neck stiffness and ear discharge.  ____ Eyes: Negative for pain, discharge, redness, itching and visual disturbance. ____ Respiratory: Negative for cough, choking, chest tightness,  shortness of breath, wheezing and stridor.  ____ Cardiovascular: Negative for chest pain, palpitations and leg swelling. ____ Gastrointestinal: Negative for Nausea/ Vomiting/ Diarrhea or Consitpation Genitourinary: Negative for dysuria, urgency, frequency, hematuria, flank pain, decreased urine volume, difficulty urinating and dyspareunia. ____ Musculoskeletal: Negative for back pain, joint swelling, arthralgias and gait problem. ________ Neurological: Negative for dizziness, tremors, seizures, syncope, facial asymmetry, speech difficulty, weakness, light-headedness, numbness and headaches. ____ Hematological: Negative for adenopathy. Does not bruise/bleed easily. ____ Psychiatric/Behavioral: Negative for hallucinations, behavioral problems, confusion, dysphoric mood, decreased concentration and agitation. ______   Objective:   Filed Vitals:   05/17/13 1154  BP: 147/98  Pulse: 72  Temp: 98.4 F (36.9 C)  Resp: 16    Physical Exam ______ Constitutional: Appears well-developed and well-nourished. No distress. ____ HENT: Normocephalic. External right and left ear normal. Oropharynx is clear and moist. ____ Eyes: Conjunctivae and EOM are normal. PERRLA, no scleral icterus. ____ Neck: Normal ROM. Neck supple. No JVD. No tracheal deviation. No thyromegaly. ____ CVS: RRR, S1/S2 +, no murmurs, no gallops, no carotid bruit.  Pulmonary: Effort and breath sounds normal, no stridor, rhonchi, wheezes, rales.  Abdominal: Soft. BS +,  no distension, tenderness, rebound or guarding. ________ Musculoskeletal: Normal range of motion. No edema and no tenderness. ____ Lymphadenopathy: No lymphadenopathy noted, cervical, inguinal. Neuro: Alert. Normal reflexes, muscle tone coordination. No cranial nerve deficit. Skin: Skin is warm and dry.  No rash noted. Not diaphoretic. No erythema. No pallor. ____ Psychiatric: Normal mood and affect. Behavior, judgment, thought content normal. __  Lab Results   Component Value Date   WBC 12.8* 05/10/2013   HGB 16.4 05/10/2013   HCT 46.2 05/10/2013   MCV 81.3 05/10/2013   PLT 360 05/10/2013   Lab Results  Component Value Date   CREATININE 1.25 05/10/2013   BUN 17 05/10/2013   NA 138 05/10/2013   K 3.4* 05/10/2013   CL 101 05/10/2013   CO2 25 05/10/2013    Lab Results  Component Value Date   HGBA1C 5.8% 04/19/2013   Lipid Panel     Component Value Date/Time   CHOL 255* 04/19/2013 1318   TRIG 156* 04/19/2013 1318   HDL 33* 04/19/2013 1318   CHOLHDL 7.7 04/19/2013 1318   VLDL 31 04/19/2013 1318   LDLCALC 191* 04/19/2013 1318       Assessment and plan:   Patient Active Problem List   Diagnosis Date Noted  . HTN (hypertension) 04/12/2013    Blood pressure better controlled on metoprolol-continue taking in addition to hydrochlorothiazide, hydralazine and Norvasc.  Come back in 3 months to have fasting lipid profile checked to see if there has been any improvement with initiation of Lipitor.  Repeat basic metabolic panel for elevated white blood cell count.

## 2013-05-18 ENCOUNTER — Ambulatory Visit: Payer: Medicare Other

## 2013-05-18 LAB — BASIC METABOLIC PANEL
BUN: 14 mg/dL (ref 6–23)
CO2: 24 mEq/L (ref 19–32)
Chloride: 105 mEq/L (ref 96–112)
Creat: 1.12 mg/dL (ref 0.50–1.35)

## 2013-07-19 ENCOUNTER — Encounter: Payer: Self-pay | Admitting: Internal Medicine

## 2013-07-19 ENCOUNTER — Ambulatory Visit: Payer: Medicare Other | Attending: Internal Medicine | Admitting: Internal Medicine

## 2013-07-19 VITALS — BP 142/82 | HR 54 | Temp 97.9°F | Resp 12 | Ht 69.0 in | Wt 173.4 lb

## 2013-07-19 DIAGNOSIS — I10 Essential (primary) hypertension: Secondary | ICD-10-CM | POA: Insufficient documentation

## 2013-07-19 DIAGNOSIS — Z09 Encounter for follow-up examination after completed treatment for conditions other than malignant neoplasm: Secondary | ICD-10-CM | POA: Insufficient documentation

## 2013-07-19 LAB — COMPREHENSIVE METABOLIC PANEL
AST: 17 U/L (ref 0–37)
Alkaline Phosphatase: 62 U/L (ref 39–117)
BUN: 15 mg/dL (ref 6–23)
Creat: 1.21 mg/dL (ref 0.50–1.35)
Potassium: 3.6 mEq/L (ref 3.5–5.3)
Total Bilirubin: 0.4 mg/dL (ref 0.3–1.2)

## 2013-07-19 LAB — CBC WITH DIFFERENTIAL/PLATELET
Basophils Relative: 0 % (ref 0–1)
Eosinophils Absolute: 0.2 10*3/uL (ref 0.0–0.7)
HCT: 44.1 % (ref 39.0–52.0)
Hemoglobin: 15.5 g/dL (ref 13.0–17.0)
MCH: 29.6 pg (ref 26.0–34.0)
MCHC: 35.1 g/dL (ref 30.0–36.0)
MCV: 84.2 fL (ref 78.0–100.0)
Monocytes Absolute: 1 10*3/uL (ref 0.1–1.0)
Monocytes Relative: 9 % (ref 3–12)

## 2013-07-19 LAB — LIPID PANEL
HDL: 32 mg/dL — ABNORMAL LOW (ref >39)
Total CHOL/HDL Ratio: 5 Ratio
VLDL: 26 mg/dL (ref 0–40)

## 2013-07-19 NOTE — Patient Instructions (Signed)
How to Take Your Blood Pressure  These instructions are only for electronic home blood pressure machines. You will need:   An automatic or semi-automatic blood pressure machine.  Fresh batteries for the blood pressure machine. HOW DO I USE THESE TOOLS TO CHECK MY BLOOD PRESSURE?   There are 2 numbers that make up your blood pressure. For example: 120/80.  The first number (120 in our example) is called the "systolic pressure." It is a measure of the pressure in your blood vessels when your heart is pumping blood.  The second number (80 in our example) is called the "diastolic pressure." It is a measure of the pressure in your blood vessels when your heart is resting between beats.  Before you buy a home blood pressure machine, check the size of your arm so you can buy the right size cuff. Here is how to check the size of your arm:  Use a tape measure that shows both inches and centimeters.  Wrap the tape measure around the middle upper part of your arm. You may need someone to help you measure right.  Write down your arm measurement in both inches and centimeters.  To measure your blood pressure right, it is important to have the right size cuff.  If your arm is up to 13 inches (37 to 34 centimeters), get an adult cuff size.  If your arm is 13 to 17 inches (35 to 44 centimeters), get a large adult cuff size.  If your arm is 17 to 20 inches (45 to 52 centimeters), get an adult thigh cuff.  Try to rest or relax for at least 30 minutes before you check your blood pressure.  Do not smoke.  Do not have any drinks with caffeine, such as:  Pop.  Coffee.  Tea.  Check your blood pressure in a quiet room.  Sit down and stretch out your arm on a table. Keep your arm at about the level of your heart. Let your arm relax. GETTING BLOOD PRESSURE READINGS  Make sure you remove any tight-fighting clothing from your arm. Wrap the cuff around your upper arm. Wrap it just above the bend,  and above where you felt the pulse. You should be able to slip a finger between the cuff and your arm. If you cannot slip a finger in the cuff, it is too tight and should be removed and rewrapped.  Some units requires you to manually pump up the arm cuff.  Automatic units inflate the cuff when you press a button.  Cuff deflation is automatic in both models.  After the cuff is inflated, the unit measures your blood pressure and pulse. The readings are displayed on a monitor. Hold still and breathe normally while the cuff is inflated.  Getting a reading takes less than a minute.  Some models store readings in a memory. Some provide a printout of readings.  Get readings at different times of the day. You should wait at least 5 minutes between readings. Take readings with you to your next doctor's visit. Document Released: 09/26/2008 Document Revised: 01/06/2012 Document Reviewed: 09/26/2008 [employer] Patient Information 2014 [employer].  

## 2013-07-19 NOTE — Progress Notes (Signed)
Patient ID: Henry Walker, male   DOB: 1961-05-19, 52 y.o.   MRN: [id number]   CC: Followup    WUJ:[id number] is 52 year old male who comes to clinic for blood pressure check. Was also told needs blood work today. He has been doing well, reports compliance with blood pressure medications, checks his blood pressure regularly and says the numbers are usually less than 140/90. He denies chest pain or shortness of breath, no specific abdominal or urinary concerns, no specific focal neurological symptoms.   No Known Allergies Past Medical History  Diagnosis Date  . Hypertension    Current Outpatient Prescriptions on File Prior to Visit  Medication Sig Dispense Refill  . amLODipine (NORVASC) 10 MG tablet Take 1 tablet (10 mg total) by mouth daily.  30 tablet  3  . hydrALAZINE (APRESOLINE) 25 MG tablet Take 1 tablet (25 mg total) by mouth 3 (three) times daily.  90 tablet  3  . hydrochlorothiazide (MICROZIDE) 12.5 MG capsule Take 1 capsule (12.5 mg total) by mouth daily.  30 capsule  3  . metoprolol (LOPRESSOR) 50 MG tablet Take 1 tablet (50 mg total) by mouth 2 (two) times daily.  60 tablet  3  . atorvastatin (LIPITOR) 10 MG tablet Take 1 tablet (10 mg total) by mouth daily.  30 tablet  3   No current facility-administered medications on file prior to visit.   No known family medical history  History   Social History  . Marital Status: Divorced    Spouse Name: N/A    Number of Children: N/A  . Years of Education: N/A   Occupational History  . Not on file.   Social History Main Topics  . Smoking status: Current Every Day Smoker -- 0.50 packs/day    Types: Cigarettes  . Smokeless tobacco: Not on file  . Alcohol Use: No  . Drug Use: No  . Sexual Activity: Not on file   Other Topics Concern  . Not on file   Social History Narrative  . No narrative on file    Review of Systems  Constitutional: Negative for fever, chills, diaphoresis, activity change, appetite change and fatigue.   HENT: Negative for ear pain, nosebleeds, congestion, facial swelling, rhinorrhea, neck pain, neck stiffness and ear discharge.   Eyes: Negative for pain, discharge, redness, itching and visual disturbance.  Respiratory: Negative for cough, choking, chest tightness, shortness of breath, wheezing and stridor.   Cardiovascular: Negative for chest pain, palpitations and leg swelling.  Gastrointestinal: Negative for abdominal distention.  Genitourinary: Negative for dysuria, urgency, frequency, hematuria, flank pain, decreased urine volume, difficulty urinating and dyspareunia.  Musculoskeletal: Negative for back pain, joint swelling, arthralgias and gait problem.  Neurological: Negative for dizziness, tremors, seizures, syncope, facial asymmetry, speech difficulty, weakness, light-headedness, numbness and headaches.  Hematological: Negative for adenopathy. Does not bruise/bleed easily.  Psychiatric/Behavioral: Negative for hallucinations, behavioral problems, confusion, dysphoric mood, decreased concentration and agitation.    Objective:   Filed Vitals:   07/19/13 1137  BP: 142/82  Pulse: 54  Temp: 97.9 F (36.6 C)  Resp: 12    Physical Exam  Constitutional: Appears well-developed and well-nourished. No distress.  CVS: RRR, S1/S2 +, no murmurs, no gallops, no carotid bruit.  Pulmonary: Effort and breath sounds normal, no stridor, rhonchi, wheezes, rales.  Abdominal: Soft. BS +,  no distension, tenderness, rebound or guarding.   Lab Results  Component Value Date   WBC 12.8* 05/10/2013   HGB 16.4 05/10/2013   HCT 46.2 05/10/2013  MCV 81.3 05/10/2013   PLT 360 05/10/2013   Lab Results  Component Value Date   CREATININE 1.12 05/17/2013   BUN 14 05/17/2013   NA 140 05/17/2013   K 3.9 05/17/2013   CL 105 05/17/2013   CO2 24 05/17/2013    Lab Results  Component Value Date   HGBA1C 5.8% 04/19/2013   Lipid Panel     Component Value Date/Time   CHOL 255* 04/19/2013 1318   TRIG 156*  04/19/2013 1318   HDL 33* 04/19/2013 1318   CHOLHDL 7.7 04/19/2013 1318   VLDL 31 04/19/2013 1318   LDLCALC 191* 04/19/2013 1318       Assessment and plan:   Patient Active Problem List   Diagnosis Date Noted  . HTN (hypertension) 04/12/2013   - Significantly improved, we'll check electrolyte panel today, check TSH and lipid panel - Patient needs no medicine refills today, I have advised him to continue checking blood pressure regularly and to call us back if the numbers are persistently higher than 140/90

## 2013-07-19 NOTE — Progress Notes (Signed)
Pt is here for a f/u from last visit 06/18/13; had blood work completed. Pt complains of issues with cholesterol. Pt is a Type II Diabetic; fasting w/glucose result of 119.

## 2013-09-14 ENCOUNTER — Other Ambulatory Visit: Payer: Self-pay

## 2013-09-14 ENCOUNTER — Telehealth: Payer: Self-pay | Admitting: Family Medicine

## 2013-09-14 DIAGNOSIS — I10 Essential (primary) hypertension: Secondary | ICD-10-CM

## 2013-09-14 MED ORDER — METOPROLOL TARTRATE 50 MG PO TABS: 50.0000 mg | ORAL_TABLET | Freq: Two times a day (BID) | ORAL | Status: DC

## 2013-09-14 MED ORDER — ATORVASTATIN CALCIUM 10 MG PO TABS: 10.0000 mg | ORAL_TABLET | Freq: Every day | ORAL | Status: DC

## 2013-09-14 MED ORDER — AMLODIPINE BESYLATE 10 MG PO TABS: 10.0000 mg | ORAL_TABLET | Freq: Every day | ORAL | Status: DC

## 2013-09-14 NOTE — Telephone Encounter (Signed)
Pt has come in today to see if he could get three scripts filled; pts last appt. Was 9/22 w/ Dr. Izola Price; metoprolol (50mg ), atorvastin (10mg ) & amlodipine (10mg )

## 2013-09-15 NOTE — Telephone Encounter (Signed)
Henry Walker could you please fill the prescription for this patient

## 2013-09-17 NOTE — Telephone Encounter (Signed)
Scripts filled 11/18

## 2013-10-12 ENCOUNTER — Encounter: Payer: Self-pay | Admitting: Family Medicine

## 2013-10-12 ENCOUNTER — Ambulatory Visit: Payer: Medicare Other | Attending: Internal Medicine | Admitting: Family Medicine

## 2013-10-12 VITALS — BP 150/89 | HR 70 | Temp 98.7°F | Resp 18 | Ht 67.0 in | Wt 184.0 lb

## 2013-10-12 DIAGNOSIS — F17200 Nicotine dependence, unspecified, uncomplicated: Secondary | ICD-10-CM

## 2013-10-12 DIAGNOSIS — Z23 Encounter for immunization: Secondary | ICD-10-CM

## 2013-10-12 DIAGNOSIS — I10 Essential (primary) hypertension: Secondary | ICD-10-CM | POA: Insufficient documentation

## 2013-10-12 MED ORDER — HYDROCHLOROTHIAZIDE 12.5 MG PO CAPS: 12.5000 mg | ORAL_CAPSULE | Freq: Every day | ORAL | Status: DC

## 2013-10-12 MED ORDER — HYDRALAZINE HCL 25 MG PO TABS: 25.0000 mg | ORAL_TABLET | Freq: Three times a day (TID) | ORAL | Status: DC

## 2013-10-12 MED ORDER — AMLODIPINE BESYLATE 10 MG PO TABS: 10.0000 mg | ORAL_TABLET | Freq: Every day | ORAL | Status: DC

## 2013-10-12 MED ORDER — METOPROLOL TARTRATE 50 MG PO TABS: 50.0000 mg | ORAL_TABLET | Freq: Two times a day (BID) | ORAL | Status: DC

## 2013-10-12 MED ORDER — ATORVASTATIN CALCIUM 10 MG PO TABS: 10.0000 mg | ORAL_TABLET | Freq: Every day | ORAL | Status: DC

## 2013-10-12 NOTE — Assessment & Plan Note (Signed)
Contemplative.  Counsel on approaches

## 2013-10-12 NOTE — Patient Instructions (Signed)
Good to see you today!  Thanks for coming in.  Stopping smoking is the best thing you could dofor your health Consider substitutes try sugarless candy or gum  Happy holidays

## 2013-10-12 NOTE — Assessment & Plan Note (Signed)
Not at goal now but has been out of HCTZ for 2-3 days.  Will continue all medications.  Recent blood work was normal  Discussed diet and exercise and smoking cessation

## 2013-10-12 NOTE — Progress Notes (Signed)
Pt here f/u HTn with medical management Need refills on all meds. Uses [facility] Flu vaccine today Counseling on smoking cessation done

## 2013-10-12 NOTE — Progress Notes (Signed)
   Subjective:    Patient ID: Henry Walker, male    DOB: 07-Oct-1961, 52 y.o.   MRN: [id number]  HPI  HYPERTENSION Disease Monitoring Home BP Monitoring usually does not check occasionally at drug store is usually in 140s Chest pain- no    Dyspnea- no Medications Compliance-  Daily all medications has a system although has been out of HCTZ for 3 days. Lightheadedness-  no  Edema- no ROS - See HPI  PMH Lab Review   Potassium  Date Value Range Status  07/19/2013 3.6  3.5 - 5.3 mEq/L Final     Sodium  Date Value Range Status  07/19/2013 141  135 - 145 mEq/L Final     Creat  Date Value Range Status  07/19/2013 1.21  0.50 - 1.35 mg/dL Final     Creatinine, Ser  Date Value Range Status  01/22/2012 1.13  0.50 - 1.35 mg/dL Final       Tobacco Smokes 1/2 ppd used to smoke 1.  Substituting food but is gaining weight.  Currenlty on disability for back problems.  Plans to stop on his own but no specific quit date     Review of Systems     Objective:   Physical Exam   Alert no acute distress Heart - Regular rate and rhythm.  No murmurs, gallops or rubs.    Lungs:  Normal respiratory effort, chest expands symmetrically. Lungs are clear to auscultation, no crackles or wheezes. Extremities:  No cyanosis, edema, or deformity noted with good range of motion of all major joints.        Assessment & Plan:   Health Maintenance - relates has a colonscopy when turned 49 but can't remember where

## 2013-10-19 ENCOUNTER — Emergency Department (HOSPITAL_COMMUNITY)
Admission: EM | Admit: 2013-10-19 | Discharge: 2013-10-19 | Disposition: A | Discharge status: Home / Self Care | Payer: Medicare Other | Attending: Emergency Medicine | Admitting: Emergency Medicine

## 2013-10-19 ENCOUNTER — Encounter (HOSPITAL_COMMUNITY): Payer: Self-pay | Admitting: Emergency Medicine

## 2013-10-19 ENCOUNTER — Emergency Department (HOSPITAL_COMMUNITY): Payer: Medicare Other

## 2013-10-19 DIAGNOSIS — F17200 Nicotine dependence, unspecified, uncomplicated: Secondary | ICD-10-CM | POA: Insufficient documentation

## 2013-10-19 DIAGNOSIS — R000 Tachycardia, unspecified: Secondary | ICD-10-CM | POA: Insufficient documentation

## 2013-10-19 DIAGNOSIS — J111 Influenza due to unidentified influenza virus with other respiratory manifestations: Secondary | ICD-10-CM | POA: Insufficient documentation

## 2013-10-19 DIAGNOSIS — R110 Nausea: Secondary | ICD-10-CM | POA: Insufficient documentation

## 2013-10-19 DIAGNOSIS — I10 Essential (primary) hypertension: Secondary | ICD-10-CM | POA: Insufficient documentation

## 2013-10-19 DIAGNOSIS — E119 Type 2 diabetes mellitus without complications: Secondary | ICD-10-CM | POA: Insufficient documentation

## 2013-10-19 DIAGNOSIS — Z79899 Other long term (current) drug therapy: Secondary | ICD-10-CM | POA: Insufficient documentation

## 2013-10-19 DIAGNOSIS — IMO0001 Reserved for inherently not codable concepts without codable children: Secondary | ICD-10-CM | POA: Insufficient documentation

## 2013-10-19 DIAGNOSIS — J069 Acute upper respiratory infection, unspecified: Secondary | ICD-10-CM | POA: Insufficient documentation

## 2013-10-19 HISTORY — DX: Type 2 diabetes mellitus without complications: E11.9

## 2013-10-19 LAB — BASIC METABOLIC PANEL
Calcium: 9.4 mg/dL (ref 8.4–10.5)
GFR calc Af Amer: 58 mL/min — ABNORMAL LOW (ref >90)
GFR calc non Af Amer: 50 mL/min — ABNORMAL LOW (ref >90)
Glucose, Bld: 113 mg/dL — ABNORMAL HIGH (ref 70–99)
Potassium: 3.1 mEq/L — ABNORMAL LOW (ref 3.5–5.1)
Sodium: 140 mEq/L (ref 135–145)

## 2013-10-19 LAB — CBC WITH DIFFERENTIAL/PLATELET
Basophils Absolute: 0 10*3/uL (ref 0.0–0.1)
Eosinophils Absolute: 0 10*3/uL (ref 0.0–0.7)
Lymphs Abs: 1.3 10*3/uL (ref 0.7–4.0)
MCH: 30.6 pg (ref 26.0–34.0)
Neutrophils Relative %: 81 % — ABNORMAL HIGH (ref 43–77)
Platelets: 222 10*3/uL (ref 150–400)
RBC: 5.45 MIL/uL (ref 4.22–5.81)
RDW: 13.3 % (ref 11.5–15.5)
WBC: 11.4 10*3/uL — ABNORMAL HIGH (ref 4.0–10.5)

## 2013-10-19 LAB — CG4 I-STAT (LACTIC ACID): Lactic Acid, Venous: 3.06 mmol/L — ABNORMAL HIGH (ref 0.5–2.2)

## 2013-10-19 MED ORDER — OXYCODONE-ACETAMINOPHEN 5-325 MG PO TABS: 2.0000 | ORAL_TABLET | ORAL | Status: DC | PRN

## 2013-10-19 MED ORDER — ONDANSETRON 4 MG PO TBDP: 4.0000 mg | ORAL_TABLET | Freq: Three times a day (TID) | ORAL | Status: DC | PRN

## 2013-10-19 MED ORDER — ACETAMINOPHEN 325 MG PO TABS
650.0000 mg | ORAL_TABLET | Freq: Once | ORAL | Status: AC
  Administered 2013-10-19: 650 mg via ORAL

## 2013-10-19 NOTE — ED Notes (Signed)
Pt states that he feel "yucky" and believes he has the flu.

## 2013-10-19 NOTE — ED Notes (Signed)
NOTIFIED D. SMITH ,NP OF PATIENT LAB RESULTS OF CG4+ LACTIC ACID = 3.82mmoI/L, @19 :05 PM ,10/19/2013.

## 2013-10-19 NOTE — ED Notes (Signed)
Pt c/o flu like sx with fever, body aches, cough and congestion x 2 days; pt sts had recent flu vaccine

## 2013-10-19 NOTE — ED Notes (Signed)
Placed patient in droplet precautions due to flu like symptoms.

## 2013-10-19 NOTE — ED Notes (Signed)
Pt dc to home.  Pt sts understanding to dc instructions. Pt ambulatory to exit without difficulty. Denies need for w/c. 

## 2013-10-20 NOTE — ED Provider Notes (Signed)
CSN: [id number]     Arrival date & time 10/19/13  1801 History   First MD Initiated Contact with Patient 10/19/13 2046     Chief Complaint  Patient presents with  . Influenza  . Cough   (Consider location/radiation/quality/duration/timing/severity/associated sxs/prior Treatment) Patient is a 52 y.o. male presenting with flu symptoms and cough.  Influenza Presenting symptoms: cough, fever, myalgias, nausea and rhinorrhea   Presenting symptoms: no headaches, no shortness of breath, no sore throat and no vomiting   Severity:  Moderate Duration:  2 days Progression:  Worsening Chronicity:  New Relieved by:  Nothing Associated symptoms: chills, decreased physical activity and nasal congestion   Risk factors: diabetes   Cough Associated symptoms: chills, fever, myalgias and rhinorrhea   Associated symptoms: no chest pain, no headaches, no rash, no shortness of breath and no sore throat     Past Medical History  Diagnosis Date  . Hypertension   . Diabetes mellitus without complication    Past Surgical History  Procedure Laterality Date  . Back surgery     History reviewed. No pertinent family history. History  Substance Use Topics  . Smoking status: Current Every Day Smoker -- 0.50 packs/day    Types: Cigarettes  . Smokeless tobacco: Not on file  . Alcohol Use: No    Review of Systems  Constitutional: Positive for fever and chills.  HENT: Positive for congestion and rhinorrhea. Negative for sore throat.   Eyes: Negative for pain.  Respiratory: Positive for cough. Negative for shortness of breath.   Cardiovascular: Negative for chest pain.  Gastrointestinal: Positive for nausea. Negative for vomiting and abdominal pain.  Genitourinary: Negative for dysuria and flank pain.  Musculoskeletal: Positive for myalgias. Negative for back pain and neck pain.  Skin: Negative for rash.  Neurological: Negative for seizures and headaches.    Allergies  Review of patient's  allergies indicates no known allergies.  Home Medications   Current Outpatient Rx  Name  Route  Sig  Dispense  Refill  . amLODipine (NORVASC) 10 MG tablet   Oral   Take 1 tablet (10 mg total) by mouth daily.   90 tablet   1   . atorvastatin (LIPITOR) 10 MG tablet   Oral   Take 1 tablet (10 mg total) by mouth daily.   90 tablet   1   . hydrALAZINE (APRESOLINE) 25 MG tablet   Oral   Take 1 tablet (25 mg total) by mouth 3 (three) times daily.   270 tablet   1   . hydrochlorothiazide (MICROZIDE) 12.5 MG capsule   Oral   Take 1 capsule (12.5 mg total) by mouth daily.   90 capsule   1   . metoprolol (LOPRESSOR) 50 MG tablet   Oral   Take 1 tablet (50 mg total) by mouth 2 (two) times daily.   180 tablet   1   . ondansetron (ZOFRAN ODT) 4 MG disintegrating tablet   Oral   Take 1 tablet (4 mg total) by mouth every 8 (eight) hours as needed for nausea or vomiting.   20 tablet   0   . oxyCODONE-acetaminophen (PERCOCET/ROXICET) 5-325 MG per tablet   Oral   Take 2 tablets by mouth every 4 (four) hours as needed for severe pain.   15 tablet   0    BP 115/62  Pulse 96  Temp(Src) 100.4 F (38 C) (Oral)  Resp 18  SpO2 93% Physical Exam  Constitutional: He is oriented to person,  place, and time. He appears well-developed and well-nourished. He appears ill.  HENT:  Head: Normocephalic and atraumatic.  Nose: Rhinorrhea present.  Eyes: Pupils are equal, round, and reactive to light. Right conjunctiva is injected. Left conjunctiva is injected.  Neck: Normal range of motion.  Cardiovascular: Regular rhythm.  Tachycardia present.   Pulmonary/Chest: Effort normal and breath sounds normal.  Abdominal: Soft. He exhibits no distension. There is no tenderness.  Musculoskeletal: Normal range of motion.  Neurological: He is alert and oriented to person, place, and time.  Skin: Skin is warm. He is not diaphoretic.   ED Course  Procedures (including critical care time) Labs  Review Labs Reviewed  CBC WITH DIFFERENTIAL - Abnormal; Notable for the following:    WBC 11.4 (*)    MCHC 36.2 (*)    Neutrophils Relative % 81 (*)    Neutro Abs 9.1 (*)    Lymphocytes Relative 11 (*)    All other components within normal limits  BASIC METABOLIC PANEL - Abnormal; Notable for the following:    Potassium 3.1 (*)    Glucose, Bld 113 (*)    Creatinine, Ser 1.53 (*)    GFR calc non Af Amer 50 (*)    GFR calc Af Amer 58 (*)    All other components within normal limits  CG4 I-STAT (LACTIC ACID) - Abnormal; Notable for the following:    Lactic Acid, Venous 3.06 (*)    All other components within normal limits   Imaging Review Dg Chest 2 View  10/19/2013   CLINICAL DATA:  Cough, fever.  EXAM: CHEST  2 VIEW  COMPARISON:  January 22, 2012.  FINDINGS: The heart size and mediastinal contours are within normal limits. Both lungs are clear. No pleural effusion or pneumothorax is noted. The visualized skeletal structures are unremarkable.  IMPRESSION: No active cardiopulmonary disease.   Electronically Signed   By: Roque Lias M.D.   On: 10/19/2013 19:09    EKG Interpretation   None       MDM   1. Viral URI   2. Flu syndrome    52 yo M with hx of DM, HTN who presents with flu-like illness, s/p receiving flu vaccine approx 1 week ago.   Likely flu-like viral illness. Patient with DM, but not otherwise high-risk, does not need treatment with antivirals at this time. Discussed likely etiology with patient, who understands symptomatic treatment at home. Discussed strict return precautions, and recommended follow-up with PCP in one week if still sick, or follow-up in ED if any symptoms worsen. Patient voices understanding. Discharged to home ins table condition. Strict return precautions given. Patient seen and evaluated by myself and my attending, Dr. Rubin Payor.     Imagene Sheller, MD 10/20/13 [phone]

## 2013-10-25 NOTE — ED Provider Notes (Signed)
I saw and evaluated the patient, reviewed the resident's note and I agree with the findings and plan.  EKG Interpretation   None      52 year old with flulike symptoms. Well appearing overall. Will discharge home  Juliet Rude. Rubin Payor, MD 10/25/13 1547

## 2014-01-10 ENCOUNTER — Ambulatory Visit: Payer: Medicare Other | Attending: Internal Medicine | Admitting: Internal Medicine

## 2014-01-10 ENCOUNTER — Encounter: Payer: Self-pay | Admitting: Internal Medicine

## 2014-01-10 VITALS — BP 147/84 | HR 62 | Temp 97.9°F | Resp 16 | Wt 178.4 lb

## 2014-01-10 DIAGNOSIS — Z760 Encounter for issue of repeat prescription: Secondary | ICD-10-CM | POA: Insufficient documentation

## 2014-01-10 DIAGNOSIS — F17200 Nicotine dependence, unspecified, uncomplicated: Secondary | ICD-10-CM | POA: Insufficient documentation

## 2014-01-10 DIAGNOSIS — I10 Essential (primary) hypertension: Secondary | ICD-10-CM | POA: Insufficient documentation

## 2014-01-10 DIAGNOSIS — E119 Type 2 diabetes mellitus without complications: Secondary | ICD-10-CM | POA: Insufficient documentation

## 2014-01-10 DIAGNOSIS — E785 Hyperlipidemia, unspecified: Secondary | ICD-10-CM | POA: Insufficient documentation

## 2014-01-10 DIAGNOSIS — K219 Gastro-esophageal reflux disease without esophagitis: Secondary | ICD-10-CM

## 2014-01-10 MED ORDER — OMEPRAZOLE 20 MG PO CPDR: 20.0000 mg | DELAYED_RELEASE_CAPSULE | Freq: Every day | ORAL | Status: DC

## 2014-01-10 MED ORDER — HYDROCHLOROTHIAZIDE 12.5 MG PO CAPS: 12.5000 mg | ORAL_CAPSULE | Freq: Every day | ORAL | Status: DC

## 2014-01-10 MED ORDER — ATORVASTATIN CALCIUM 10 MG PO TABS: 10.0000 mg | ORAL_TABLET | Freq: Every day | ORAL | Status: DC

## 2014-01-10 MED ORDER — AMLODIPINE BESYLATE 10 MG PO TABS: 10.0000 mg | ORAL_TABLET | Freq: Every day | ORAL | Status: DC

## 2014-01-10 NOTE — Progress Notes (Signed)
MRN: [id number] Name: Henry Walker  Sex: male Age: 53 y.o. DOB: May 08, 1961  Allergies: Review of patient's allergies indicates no known allergies.  Chief Complaint  Patient presents with  . Follow-up    HPI: Patient is 53 y.o. male who has history of hypertension comes today for followup and requesting refill on the medication, he also reported to have a lot of GERD symptoms, patient does smoke cigarettes everyday, advised to quit smoking as per patient he will try to quit on his own, denies any fever chills chest pain shortness of breath any change in bowel habits.   Past Medical History  Diagnosis Date  . Hypertension   . Diabetes mellitus without complication     Past Surgical History  Procedure Laterality Date  . Back surgery        Medication List       This list is accurate as of: 01/10/14  1:02 PM.  Always use your most recent med list.               amLODipine 10 MG tablet  Commonly known as:  NORVASC  Take 1 tablet (10 mg total) by mouth daily.     atorvastatin 10 MG tablet  Commonly known as:  LIPITOR  Take 1 tablet (10 mg total) by mouth daily.     hydrALAZINE 25 MG tablet  Commonly known as:  APRESOLINE  Take 1 tablet (25 mg total) by mouth 3 (three) times daily.     hydrochlorothiazide 12.5 MG capsule  Commonly known as:  MICROZIDE  Take 1 capsule (12.5 mg total) by mouth daily.     metoprolol 50 MG tablet  Commonly known as:  LOPRESSOR  Take 1 tablet (50 mg total) by mouth 2 (two) times daily.     omeprazole 20 MG capsule  Commonly known as:  PRILOSEC  Take 1 capsule (20 mg total) by mouth daily.     ondansetron 4 MG disintegrating tablet  Commonly known as:  ZOFRAN ODT  Take 1 tablet (4 mg total) by mouth every 8 (eight) hours as needed for nausea or vomiting.     oxyCODONE-acetaminophen 5-325 MG per tablet  Commonly known as:  PERCOCET/ROXICET  Take 2 tablets by mouth every 4 (four) hours as needed for severe pain.          Meds ordered this encounter  Medications  . amLODipine (NORVASC) 10 MG tablet    Sig: Take 1 tablet (10 mg total) by mouth daily.    Dispense:  90 tablet    Refill:  1  . atorvastatin (LIPITOR) 10 MG tablet    Sig: Take 1 tablet (10 mg total) by mouth daily.    Dispense:  90 tablet    Refill:  1  . hydrochlorothiazide (MICROZIDE) 12.5 MG capsule    Sig: Take 1 capsule (12.5 mg total) by mouth daily.    Dispense:  90 capsule    Refill:  1  . omeprazole (PRILOSEC) 20 MG capsule    Sig: Take 1 capsule (20 mg total) by mouth daily.    Dispense:  30 capsule    Refill:  3    Immunization History  Administered Date(s) Administered  . Influenza,inj,Quad PF,36+ Mos 10/12/2013    History reviewed. No pertinent family history.  History  Substance Use Topics  . Smoking status: Current Every Day Smoker -- 0.50 packs/day    Types: Cigarettes  . Smokeless tobacco: Not on file  . Alcohol Use: No  Review of Systems   As noted in HPI  Filed Vitals:   01/10/14 1245  BP: 147/84  Pulse: 62  Temp: 97.9 F (36.6 C)  Resp: 16    Physical Exam  Physical Exam  Constitutional: No distress.  Eyes: EOM are normal. Pupils are equal, round, and reactive to light.  Cardiovascular: Normal rate and regular rhythm.   Pulmonary/Chest: Breath sounds normal. No respiratory distress. He has no wheezes. He has no rales.  Abdominal: Soft. There is no tenderness. There is no rebound.    CBC    Component Value Date/Time   WBC 11.4* 10/19/2013 1810   RBC 5.45 10/19/2013 1810   HGB 16.7 10/19/2013 1810   HCT 46.1 10/19/2013 1810   PLT 222 10/19/2013 1810   MCV 84.6 10/19/2013 1810   LYMPHSABS 1.3 10/19/2013 1810   MONOABS 0.9 10/19/2013 1810   EOSABS 0.0 10/19/2013 1810   BASOSABS 0.0 10/19/2013 1810    CMP     Component Value Date/Time   NA 140 10/19/2013 1810   K 3.1* 10/19/2013 1810   CL 100 10/19/2013 1810   CO2 24 10/19/2013 1810   GLUCOSE 113* 10/19/2013 1810   BUN  14 10/19/2013 1810   CREATININE 1.53* 10/19/2013 1810   CREATININE 1.21 07/19/2013 1148   CALCIUM 9.4 10/19/2013 1810   PROT 7.9 07/19/2013 1148   ALBUMIN 4.5 07/19/2013 1148   AST 17 07/19/2013 1148   ALT 25 07/19/2013 1148   ALKPHOS 62 07/19/2013 1148   BILITOT 0.4 07/19/2013 1148   GFRNONAA 50* 10/19/2013 1810   GFRAA 58* 10/19/2013 1810    Lab Results  Component Value Date/Time   CHOL 159 07/19/2013 11:48 AM    No components found with this basename: hga1c    Lab Results  Component Value Date/Time   AST 17 07/19/2013 11:48 AM    Assessment and Plan  HTN (hypertension) - Plan: amLODipine (NORVASC) 10 MG tablet, hydrochlorothiazide (MICROZIDE) 12.5 MG capsule, COMPLETE METABOLIC PANEL WITH GFR  Other and unspecified hyperlipidemia - Plan: atorvastatin (LIPITOR) 10 MG tablet, Lipid panel  GERD (gastroesophageal reflux disease) - Plan: Advised patient for lifestyle modification, trial of omeprazole (PRILOSEC) 20 MG capsule  Tobacco use disorder Patient is going to try to quit on his own. Patient declined prescription for nicotine patch.  Return in about 3 months (around 04/12/2014) for hypertension, hyperipidemia.  Lorayne Marek, MD

## 2014-01-10 NOTE — Progress Notes (Signed)
Patient here for follow up-HTN Needs refills on medication

## 2014-01-11 ENCOUNTER — Telehealth: Payer: Self-pay

## 2014-01-11 LAB — COMPLETE METABOLIC PANEL WITH GFR
ALK PHOS: 58 U/L (ref 39–117)
ALT: 38 U/L (ref 0–53)
AST: 24 U/L (ref 0–37)
Albumin: 4.4 g/dL (ref 3.5–5.2)
BILIRUBIN TOTAL: 0.4 mg/dL (ref 0.2–1.2)
BUN: 15 mg/dL (ref 6–23)
CO2: 27 mEq/L (ref 19–32)
CREATININE: 1.21 mg/dL (ref 0.50–1.35)
Calcium: 10.3 mg/dL (ref 8.4–10.5)
Chloride: 103 mEq/L (ref 96–112)
GFR, EST NON AFRICAN AMERICAN: 68 mL/min
GFR, Est African American: 79 mL/min
GLUCOSE: 108 mg/dL — AB (ref 70–99)
Potassium: 3.8 mEq/L (ref 3.5–5.3)
Sodium: 140 mEq/L (ref 135–145)
Total Protein: 7.4 g/dL (ref 6.0–8.3)

## 2014-01-11 LAB — LIPID PANEL
CHOL/HDL RATIO: 5.7 ratio
Cholesterol: 178 mg/dL (ref 0–200)
HDL: 31 mg/dL — AB (ref >39)
LDL Cholesterol: 119 mg/dL — ABNORMAL HIGH (ref 0–99)
TRIGLYCERIDES: 142 mg/dL (ref <150)
VLDL: 28 mg/dL (ref 0–40)

## 2014-01-11 NOTE — Telephone Encounter (Signed)
Message copied by Dorothe Pea on Tue Jan 11, 2014  2:10 PM ------      Message from: Lorayne Marek      Created: Tue Jan 11, 2014 11:04 AM       Blood work reviewed noticed impaired fasting glucose, call and advise patient for low carbohydrate diet.       ------

## 2014-01-11 NOTE — Telephone Encounter (Signed)
Patient not available Unable to leave message- no voice mail set up

## 2014-04-12 ENCOUNTER — Ambulatory Visit: Payer: Medicare Other | Attending: Internal Medicine | Admitting: Internal Medicine

## 2014-04-12 ENCOUNTER — Encounter: Payer: Self-pay | Admitting: Internal Medicine

## 2014-04-12 VITALS — BP 133/84 | HR 73 | Temp 98.0°F | Resp 16 | Wt 181.0 lb

## 2014-04-12 DIAGNOSIS — R7301 Impaired fasting glucose: Secondary | ICD-10-CM

## 2014-04-12 DIAGNOSIS — F17200 Nicotine dependence, unspecified, uncomplicated: Secondary | ICD-10-CM | POA: Diagnosis not present

## 2014-04-12 DIAGNOSIS — Z1211 Encounter for screening for malignant neoplasm of colon: Secondary | ICD-10-CM

## 2014-04-12 DIAGNOSIS — E785 Hyperlipidemia, unspecified: Secondary | ICD-10-CM | POA: Diagnosis not present

## 2014-04-12 DIAGNOSIS — Z79899 Other long term (current) drug therapy: Secondary | ICD-10-CM | POA: Insufficient documentation

## 2014-04-12 DIAGNOSIS — K219 Gastro-esophageal reflux disease without esophagitis: Secondary | ICD-10-CM

## 2014-04-12 DIAGNOSIS — I10 Essential (primary) hypertension: Secondary | ICD-10-CM | POA: Diagnosis not present

## 2014-04-12 DIAGNOSIS — E119 Type 2 diabetes mellitus without complications: Secondary | ICD-10-CM | POA: Insufficient documentation

## 2014-04-12 MED ORDER — AMLODIPINE BESYLATE 10 MG PO TABS: 10.0000 mg | ORAL_TABLET | Freq: Every day | ORAL | Status: DC

## 2014-04-12 MED ORDER — ATORVASTATIN CALCIUM 10 MG PO TABS: 10.0000 mg | ORAL_TABLET | Freq: Every day | ORAL | Status: DC

## 2014-04-12 MED ORDER — HYDROCHLOROTHIAZIDE 12.5 MG PO CAPS: 12.5000 mg | ORAL_CAPSULE | Freq: Every day | ORAL | Status: DC

## 2014-04-12 MED ORDER — HYDRALAZINE HCL 25 MG PO TABS: 25.0000 mg | ORAL_TABLET | Freq: Three times a day (TID) | ORAL | Status: DC

## 2014-04-12 MED ORDER — OMEPRAZOLE 20 MG PO CPDR: 20.0000 mg | DELAYED_RELEASE_CAPSULE | Freq: Every day | ORAL | Status: DC

## 2014-04-12 MED ORDER — METOPROLOL TARTRATE 50 MG PO TABS: 50.0000 mg | ORAL_TABLET | Freq: Two times a day (BID) | ORAL | Status: DC

## 2014-04-12 NOTE — Patient Instructions (Addendum)
Diabetes Meal Planning Guide The diabetes meal planning guide is a tool to help you plan your meals and snacks. It is important for people with diabetes to manage their blood glucose (sugar) levels. Choosing the right foods and the right amounts throughout your day will help control your blood glucose. Eating right can even help you improve your blood pressure and reach or maintain a healthy weight. CARBOHYDRATE COUNTING MADE EASY When you eat carbohydrates, they turn to sugar. This raises your blood glucose level. Counting carbohydrates can help you control this level so you feel better. When you plan your meals by counting carbohydrates, you can have more flexibility in what you eat and balance your medicine with your food intake. Carbohydrate counting simply means adding up the total amount of carbohydrate grams in your meals and snacks. Try to eat about the same amount at each meal. Foods with carbohydrates are listed below. Each portion below is 1 carbohydrate serving or 15 grams of carbohydrates. Ask your dietician how many grams of carbohydrates you should eat at each meal or snack. Grains and Starches  1 slice bread.   English muffin or hotdog/hamburger bun.   cup cold cereal (unsweetened).   cup cooked pasta or rice.   cup starchy vegetables (corn, potatoes, peas, beans, winter squash).  1 tortilla (6 inches).   bagel.  1 waffle or pancake (size of a CD).   cup cooked cereal.  4 to 6 small crackers. *Whole grain is recommended. Fruit  1 cup fresh unsweetened berries, melon, papaya, pineapple.  1 small fresh fruit.   banana or mango.   cup fruit juice (4 oz unsweetened).   cup canned fruit in natural juice or water.  2 tbs dried fruit.  12 to 15 grapes or cherries. Milk and Yogurt  1 cup fat-free or 1% milk.  1 cup soy milk.  6 oz light yogurt with sugar-free sweetener.  6 oz low-fat soy yogurt.  6 oz plain yogurt. Vegetables  1 cup raw or  cup  cooked is counted as 0 carbohydrates or a "free" food.  If you eat 3 or more servings at 1 meal, count them as 1 carbohydrate serving. Other Carbohydrates   oz chips or pretzels.   cup ice cream or frozen yogurt.   cup sherbet or sorbet.  2 inch square cake, no frosting.  1 tbs honey, sugar, jam, jelly, or syrup.  2 small cookies.  3 squares of graham crackers.  3 cups popcorn.  6 crackers.  1 cup broth-based soup.  Count 1 cup casserole or other mixed foods as 2 carbohydrate servings.  Foods with less than 20 calories in a serving may be counted as 0 carbohydrates or a "free" food. You may want to purchase a book or computer software that lists the carbohydrate gram counts of different foods. In addition, the nutrition facts panel on the labels of the foods you eat are a good source of this information. The label will tell you how big the serving size is and the total number of carbohydrate grams you will be eating per serving. Divide this number by 15 to obtain the number of carbohydrate servings in a portion. Remember, 1 carbohydrate serving equals 15 grams of carbohydrate. SERVING SIZES Measuring foods and serving sizes helps you make sure you are getting the right amount of food. The list below tells how big or small some common serving sizes are.  1 oz.........4 stacked dice.  3 oz.........Deck of cards.  1 tsp........Tip   of little finger.  1 tbs........Thumb.  2 tbs........Golf ball.   cup.......Half of a fist.  1 cup........A fist. SAMPLE DIABETES MEAL PLAN Below is a sample meal plan that includes foods from the grain and starches, dairy, vegetable, fruit, and meat groups. A dietician can individualize a meal plan to fit your calorie needs and tell you the number of servings needed from each food group. However, controlling the total amount of carbohydrates in your meal or snack is more important than making sure you include all of the food groups at every  meal. You may interchange carbohydrate containing foods (dairy, starches, and fruits). The meal plan below is an example of a 2000 calorie diet using carbohydrate counting. This meal plan has 17 carbohydrate servings. Breakfast  1 cup oatmeal (2 carb servings).   cup light yogurt (1 carb serving).  1 cup blueberries (1 carb serving).   cup almonds. Snack  1 large apple (2 carb servings).  1 low-fat string cheese stick. Lunch  Chicken breast salad.  1 cup spinach.   cup chopped tomatoes.  2 oz chicken breast, sliced.  2 tbs low-fat Italian dressing.  12 whole-wheat crackers (2 carb servings).  12 to 15 grapes (1 carb serving).  1 cup low-fat milk (1 carb serving). Snack  1 cup carrots.   cup hummus (1 carb serving). Dinner  3 oz broiled salmon.  1 cup brown rice (3 carb servings). Snack  1  cups steamed broccoli (1 carb serving) drizzled with 1 tsp olive oil and lemon juice.  1 cup light pudding (2 carb servings). DIABETES MEAL PLANNING WORKSHEET Your dietician can use this worksheet to help you decide how many servings of foods and what types of foods are right for you.  BREAKFAST Food Group and Servings / Carb Servings Grain/Starches __________________________________ Dairy __________________________________________ Vegetable ______________________________________ Fruit ___________________________________________ Meat __________________________________________ Fat ____________________________________________ LUNCH Food Group and Servings / Carb Servings Grain/Starches ___________________________________ Dairy ___________________________________________ Fruit ____________________________________________ Meat ___________________________________________ Fat _____________________________________________ DINNER Food Group and Servings / Carb Servings Grain/Starches ___________________________________ Dairy  ___________________________________________ Fruit ____________________________________________ Meat ___________________________________________ Fat _____________________________________________ SNACKS Food Group and Servings / Carb Servings Grain/Starches ___________________________________ Dairy ___________________________________________ Vegetable _______________________________________ Fruit ____________________________________________ Meat ___________________________________________ Fat _____________________________________________ DAILY TOTALS Starches _________________________ Vegetable ________________________ Fruit ____________________________ Dairy ____________________________ Meat ____________________________ Fat ______________________________ Document Released: 07/11/2005 Document Revised: 01/06/2012 Document Reviewed: 05/22/2009 [employer] Patient Information 2014 [employer]. Fat and Cholesterol Control Diet Fat and cholesterol levels in your blood and organs are influenced by your diet. High levels of fat and cholesterol may lead to diseases of the heart, small and large blood vessels, gallbladder, liver, and pancreas. CONTROLLING FAT AND CHOLESTEROL WITH DIET Although exercise and lifestyle factors are important, your diet is key. That is because certain foods are known to raise cholesterol and others to lower it. The goal is to balance foods for their effect on cholesterol and more importantly, to replace saturated and trans fat with other types of fat, such as monounsaturated fat, polyunsaturated fat, and omega-3 fatty acids. On average, a person should consume no more than 15 to 17 g of saturated fat daily. Saturated and trans fats are considered "bad" fats, and they will raise LDL cholesterol. Saturated fats are primarily found in animal products such as meats, butter, and cream. However, that does not mean you need to give up all your favorite foods. Today, there are  good tasting, low-fat, low-cholesterol substitutes for most of the things you like to eat. Choose low-fat or nonfat alternatives. Choose round or   loin cuts of red meat. These types of cuts are lowest in fat and cholesterol. Chicken (without the skin), fish, veal, and ground turkey breast are great choices. Eliminate fatty meats, such as hot dogs and salami. Even shellfish have little or no saturated fat. Have a 3 oz (85 g) portion when you eat lean meat, poultry, or fish. Trans fats are also called "partially hydrogenated oils." They are oils that have been scientifically manipulated so that they are solid at room temperature resulting in a longer shelf life and improved taste and texture of foods in which they are added. Trans fats are found in stick margarine, some tub margarines, cookies, crackers, and baked goods.  When baking and cooking, oils are a great substitute for butter. The monounsaturated oils are especially beneficial since it is believed they lower LDL and raise HDL. The oils you should avoid entirely are saturated tropical oils, such as coconut and palm.  Remember to eat a lot from food groups that are naturally free of saturated and trans fat, including fish, fruit, vegetables, beans, grains (barley, rice, couscous, bulgur wheat), and pasta (without cream sauces).  IDENTIFYING FOODS THAT LOWER FAT AND CHOLESTEROL  Soluble fiber may lower your cholesterol. This type of fiber is found in fruits such as apples, vegetables such as broccoli, potatoes, and carrots, legumes such as beans, peas, and lentils, and grains such as barley. Foods fortified with plant sterols (phytosterol) may also lower cholesterol. You should eat at least 2 g per day of these foods for a cholesterol lowering effect.  Read package labels to identify low-saturated fats, trans fat free, and low-fat foods at the supermarket. Select cheeses that have only 2 to 3 g saturated fat per ounce. Use a heart-healthy tub margarine that  is free of trans fats or partially hydrogenated oil. When buying baked goods (cookies, crackers), avoid partially hydrogenated oils. Breads and muffins should be made from whole grains (whole-wheat or whole oat flour, instead of "flour" or "enriched flour"). Buy non-creamy canned soups with reduced salt and no added fats.  FOOD PREPARATION TECHNIQUES  Never deep-fry. If you must fry, either stir-fry, which uses very little fat, or use non-stick cooking sprays. When possible, broil, bake, or roast meats, and steam vegetables. Instead of putting butter or margarine on vegetables, use lemon and herbs, applesauce, and cinnamon (for squash and sweet potatoes). Use nonfat yogurt, salsa, and low-fat dressings for salads.  LOW-SATURATED FAT / LOW-FAT FOOD SUBSTITUTES Meats / Saturated Fat (g)  Avoid: Steak, marbled (3 oz/85 g) / 11 g  Choose: Steak, lean (3 oz/85 g) / 4 g  Avoid: Hamburger (3 oz/85 g) / 7 g  Choose: Hamburger, lean (3 oz/85 g) / 5 g  Avoid: Ham (3 oz/85 g) / 6 g  Choose: Ham, lean cut (3 oz/85 g) / 2.4 g  Avoid: Chicken, with skin, dark meat (3 oz/85 g) / 4 g  Choose: Chicken, skin removed, dark meat (3 oz/85 g) / 2 g  Avoid: Chicken, with skin, light meat (3 oz/85 g) / 2.5 g  Choose: Chicken, skin removed, light meat (3 oz/85 g) / 1 g Dairy / Saturated Fat (g)  Avoid: Whole milk (1 cup) / 5 g  Choose: Low-fat milk, 2% (1 cup) / 3 g  Choose: Low-fat milk, 1% (1 cup) / 1.5 g  Choose: Skim milk (1 cup) / 0.3 g  Avoid: Hard cheese (1 oz/28 g) / 6 g  Choose: Skim milk cheese (1 oz/28 g) /   2 to 3 g  Avoid: Cottage cheese, 4% fat (1 cup) / 6.5 g  Choose: Low-fat cottage cheese, 1% fat (1 cup) / 1.5 g  Avoid: Ice cream (1 cup) / 9 g  Choose: Sherbet (1 cup) / 2.5 g  Choose: Nonfat frozen yogurt (1 cup) / 0.3 g  Choose: Frozen fruit bar / trace  Avoid: Whipped cream (1 tbs) / 3.5 g  Choose: Nondairy whipped topping (1 tbs) / 1 g Condiments / Saturated Fat  (g)  Avoid: Mayonnaise (1 tbs) / 2 g  Choose: Low-fat mayonnaise (1 tbs) / 1 g  Avoid: Butter (1 tbs) / 7 g  Choose: Extra light margarine (1 tbs) / 1 g  Avoid: Coconut oil (1 tbs) / 11.8 g  Choose: Olive oil (1 tbs) / 1.8 g  Choose: Corn oil (1 tbs) / 1.7 g  Choose: Safflower oil (1 tbs) / 1.2 g  Choose: Sunflower oil (1 tbs) / 1.4 g  Choose: Soybean oil (1 tbs) / 2.4 g  Choose: Canola oil (1 tbs) / 1 g Document Released: 10/14/2005 Document Revised: 02/08/2013 Document Reviewed: 04/04/2011 [employer] Patient Information 2014 [employer].  

## 2014-04-12 NOTE — Progress Notes (Signed)
MRN: [id number] Name: Henry Walker  Sex: male Age: 53 y.o. DOB: 29-Apr-1961  Allergies: Review of patient's allergies indicates no known allergies.  Chief Complaint  Patient presents with  . Follow-up    HPI: Patient is 53 y.o. male who has history of hypertension GERD  hyperlipidemia comes today for followup, denies any acute symptoms denies any headache dizziness chest and shortness of breath, has been compliant in taking medications, she's requesting refill on the medications, he had a blood work done which was reviewed with the patient noticed impaired fasting glucose, patient has family history of diabetes, he is advised for low carbohydrate diet. Patient is to smoke cigarettes and trying to cut down and quit.  Past Medical History  Diagnosis Date  . Hypertension   . Diabetes mellitus without complication     Past Surgical History  Procedure Laterality Date  . Back surgery        Medication List       This list is accurate as of: 04/12/14 11:23 AM.  Always use your most recent med list.               amLODipine 10 MG tablet  Commonly known as:  NORVASC  Take 1 tablet (10 mg total) by mouth daily.     atorvastatin 10 MG tablet  Commonly known as:  LIPITOR  Take 1 tablet (10 mg total) by mouth daily.     hydrALAZINE 25 MG tablet  Commonly known as:  APRESOLINE  Take 1 tablet (25 mg total) by mouth 3 (three) times daily.     hydrochlorothiazide 12.5 MG capsule  Commonly known as:  MICROZIDE  Take 1 capsule (12.5 mg total) by mouth daily.     metoprolol 50 MG tablet  Commonly known as:  LOPRESSOR  Take 1 tablet (50 mg total) by mouth 2 (two) times daily.     omeprazole 20 MG capsule  Commonly known as:  PRILOSEC  Take 1 capsule (20 mg total) by mouth daily.     ondansetron 4 MG disintegrating tablet  Commonly known as:  ZOFRAN ODT  Take 1 tablet (4 mg total) by mouth every 8 (eight) hours as needed for nausea or vomiting.     oxyCODONE-acetaminophen 5-325 MG per tablet  Commonly known as:  PERCOCET/ROXICET  Take 2 tablets by mouth every 4 (four) hours as needed for severe pain.        Meds ordered this encounter  Medications  . amLODipine (NORVASC) 10 MG tablet    Sig: Take 1 tablet (10 mg total) by mouth daily.    Dispense:  90 tablet    Refill:  2  . atorvastatin (LIPITOR) 10 MG tablet    Sig: Take 1 tablet (10 mg total) by mouth daily.    Dispense:  90 tablet    Refill:  2  . hydrALAZINE (APRESOLINE) 25 MG tablet    Sig: Take 1 tablet (25 mg total) by mouth 3 (three) times daily.    Dispense:  270 tablet    Refill:  1  . hydrochlorothiazide (MICROZIDE) 12.5 MG capsule    Sig: Take 1 capsule (12.5 mg total) by mouth daily.    Dispense:  90 capsule    Refill:  1  . metoprolol (LOPRESSOR) 50 MG tablet    Sig: Take 1 tablet (50 mg total) by mouth 2 (two) times daily.    Dispense:  180 tablet    Refill:  1  . omeprazole (PRILOSEC)  20 MG capsule    Sig: Take 1 capsule (20 mg total) by mouth daily.    Dispense:  30 capsule    Refill:  3    Immunization History  Administered Date(s) Administered  . Influenza,inj,Quad PF,36+ Mos 10/12/2013    History reviewed. No pertinent family history.  History  Substance Use Topics  . Smoking status: Current Every Day Smoker -- 0.50 packs/day    Types: Cigarettes  . Smokeless tobacco: Not on file  . Alcohol Use: No    Review of Systems   As noted in HPI  Filed Vitals:   04/12/14 1100  BP: 133/84  Pulse: 73  Temp: 98 F (36.7 C)  Resp: 16    Physical Exam  Physical Exam  Constitutional: No distress.  Eyes: EOM are normal. Pupils are equal, round, and reactive to light.  Cardiovascular: Normal rate and regular rhythm.   Pulmonary/Chest: Breath sounds normal. No respiratory distress. He has no wheezes. He has no rales.  Abdominal: Soft. There is no tenderness.  Musculoskeletal: He exhibits no edema.    CBC    Component Value  Date/Time   WBC 11.4* 10/19/2013 1810   RBC 5.45 10/19/2013 1810   HGB 16.7 10/19/2013 1810   HCT 46.1 10/19/2013 1810   PLT 222 10/19/2013 1810   MCV 84.6 10/19/2013 1810   LYMPHSABS 1.3 10/19/2013 1810   MONOABS 0.9 10/19/2013 1810   EOSABS 0.0 10/19/2013 1810   BASOSABS 0.0 10/19/2013 1810    CMP     Component Value Date/Time   NA 140 01/10/2014 1304   K 3.8 01/10/2014 1304   CL 103 01/10/2014 1304   CO2 27 01/10/2014 1304   GLUCOSE 108* 01/10/2014 1304   BUN 15 01/10/2014 1304   CREATININE 1.21 01/10/2014 1304   CREATININE 1.53* 10/19/2013 1810   CALCIUM 10.3 01/10/2014 1304   PROT 7.4 01/10/2014 1304   ALBUMIN 4.4 01/10/2014 1304   AST 24 01/10/2014 1304   ALT 38 01/10/2014 1304   ALKPHOS 58 01/10/2014 1304   BILITOT 0.4 01/10/2014 1304   GFRNONAA 68 01/10/2014 1304   GFRNONAA 50* 10/19/2013 1810   GFRAA 79 01/10/2014 1304   GFRAA 58* 10/19/2013 1810    Lab Results  Component Value Date/Time   CHOL 178 01/10/2014  1:04 PM    No components found with this basename: hga1c    Lab Results  Component Value Date/Time   AST 24 01/10/2014  1:04 PM    Assessment and Plan  HTN (hypertension) - Plan: Blood pressure is well-controlled continued current medications amLODipine (NORVASC) 10 MG tablet, hydrochlorothiazide (MICROZIDE) 12.5 MG capsule, metoprolol (LOPRESSOR) 50 MG tablet  Other and unspecified hyperlipidemia - Plan: Continue with her atorvastatin (LIPITOR) 10 MG tablet, also advise for diet modification, repeat lipid panel on the next visit.   GERD (gastroesophageal reflux disease) - Plan: omeprazole (PRILOSEC) 20 MG capsule  Tobacco use disorder Patient is going to try to quit smoking.  IFG (impaired fasting glucose) I have advised patient for low carbohydrate  Special screening for malignant neoplasms, colon - Plan: Ambulatory referral to Gastroenterology   Health Maintenance -Colonoscopy: referred to GI   Return in about 3 months (around 07/13/2014) for  hypertension, hyperipidemia.  Lorayne Marek, MD

## 2014-04-12 NOTE — Progress Notes (Signed)
Patient here for follow up on his HTN and for  Medication refills

## 2014-07-21 ENCOUNTER — Encounter: Payer: Self-pay | Admitting: Internal Medicine

## 2014-07-21 ENCOUNTER — Ambulatory Visit: Payer: Medicare Other | Attending: Internal Medicine | Admitting: Internal Medicine

## 2014-07-21 VITALS — BP 135/80 | HR 71 | Temp 98.6°F | Resp 16 | Wt 184.4 lb

## 2014-07-21 DIAGNOSIS — K2100 Gastro-esophageal reflux disease with esophagitis, without bleeding: Secondary | ICD-10-CM

## 2014-07-21 DIAGNOSIS — Z79899 Other long term (current) drug therapy: Secondary | ICD-10-CM | POA: Diagnosis not present

## 2014-07-21 DIAGNOSIS — I10 Essential (primary) hypertension: Secondary | ICD-10-CM | POA: Diagnosis not present

## 2014-07-21 DIAGNOSIS — E785 Hyperlipidemia, unspecified: Secondary | ICD-10-CM | POA: Diagnosis not present

## 2014-07-21 DIAGNOSIS — R7301 Impaired fasting glucose: Secondary | ICD-10-CM | POA: Diagnosis not present

## 2014-07-21 DIAGNOSIS — F17200 Nicotine dependence, unspecified, uncomplicated: Secondary | ICD-10-CM | POA: Diagnosis not present

## 2014-07-21 DIAGNOSIS — Z1211 Encounter for screening for malignant neoplasm of colon: Secondary | ICD-10-CM

## 2014-07-21 DIAGNOSIS — N529 Male erectile dysfunction, unspecified: Secondary | ICD-10-CM | POA: Diagnosis not present

## 2014-07-21 DIAGNOSIS — K219 Gastro-esophageal reflux disease without esophagitis: Secondary | ICD-10-CM | POA: Insufficient documentation

## 2014-07-21 LAB — COMPLETE METABOLIC PANEL WITH GFR
ALT: 33 U/L (ref 0–53)
AST: 23 U/L (ref 0–37)
Albumin: 4 g/dL (ref 3.5–5.2)
Alkaline Phosphatase: 63 U/L (ref 39–117)
BILIRUBIN TOTAL: 0.4 mg/dL (ref 0.2–1.2)
BUN: 10 mg/dL (ref 6–23)
CHLORIDE: 104 meq/L (ref 96–112)
CO2: 25 meq/L (ref 19–32)
CREATININE: 1.27 mg/dL (ref 0.50–1.35)
Calcium: 9.6 mg/dL (ref 8.4–10.5)
GFR, EST AFRICAN AMERICAN: 74 mL/min
GFR, EST NON AFRICAN AMERICAN: 64 mL/min
Glucose, Bld: 123 mg/dL — ABNORMAL HIGH (ref 70–99)
Potassium: 3.7 mEq/L (ref 3.5–5.3)
SODIUM: 141 meq/L (ref 135–145)
Total Protein: 7.2 g/dL (ref 6.0–8.3)

## 2014-07-21 LAB — LIPID PANEL
Cholesterol: 152 mg/dL (ref 0–200)
HDL: 28 mg/dL — ABNORMAL LOW (ref >39)
LDL CALC: 92 mg/dL (ref 0–99)
Total CHOL/HDL Ratio: 5.4 Ratio
Triglycerides: 160 mg/dL — ABNORMAL HIGH (ref <150)
VLDL: 32 mg/dL (ref 0–40)

## 2014-07-21 MED ORDER — OMEPRAZOLE 20 MG PO CPDR: 20.0000 mg | DELAYED_RELEASE_CAPSULE | Freq: Every day | ORAL | Status: DC

## 2014-07-21 MED ORDER — TADALAFIL 20 MG PO TABS: 10.0000 mg | ORAL_TABLET | ORAL | Status: DC | PRN

## 2014-07-21 MED ORDER — HYDRALAZINE HCL 25 MG PO TABS: 25.0000 mg | ORAL_TABLET | Freq: Three times a day (TID) | ORAL | Status: DC

## 2014-07-21 MED ORDER — METOPROLOL TARTRATE 50 MG PO TABS: 50.0000 mg | ORAL_TABLET | Freq: Two times a day (BID) | ORAL | Status: DC

## 2014-07-21 NOTE — Patient Instructions (Signed)
DASH Eating Plan °DASH stands for "Dietary Approaches to Stop Hypertension." The DASH eating plan is a healthy eating plan that has been shown to reduce high blood pressure (hypertension). Additional health benefits may include reducing the risk of type 2 diabetes mellitus, heart disease, and stroke. The DASH eating plan may also help with weight loss. °WHAT DO I NEED TO KNOW ABOUT THE DASH EATING PLAN? °For the DASH eating plan, you will follow these general guidelines: °· Choose foods with a percent daily value for sodium of less than 5% (as listed on the food label). °· Use salt-free seasonings or herbs instead of table salt or sea salt. °· Check with your health care provider or pharmacist before using salt substitutes. °· Eat lower-sodium products, often labeled as "lower sodium" or "no salt added." °· Eat fresh foods. °· Eat more vegetables, fruits, and low-fat dairy products. °· Choose whole grains. Look for the word "whole" as the first word in the ingredient list. °· Choose fish and skinless chicken or turkey more often than red meat. Limit fish, poultry, and meat to 6 oz (170 g) each day. °· Limit sweets, desserts, sugars, and sugary drinks. °· Choose heart-healthy fats. °· Limit cheese to 1 oz (28 g) per day. °· Eat more home-cooked food and less restaurant, buffet, and fast food. °· Limit fried foods. °· Cook foods using methods other than frying. °· Limit canned vegetables. If you do use them, rinse them well to decrease the sodium. °· When eating at a restaurant, ask that your food be prepared with less salt, or no salt if possible. °WHAT FOODS CAN I EAT? °Seek help from a dietitian for individual calorie needs. °Grains °Whole grain or whole wheat bread. Brown rice. Whole grain or whole wheat pasta. Quinoa, bulgur, and whole grain cereals. Low-sodium cereals. Corn or whole wheat flour tortillas. Whole grain cornbread. Whole grain crackers. Low-sodium crackers. °Vegetables °Fresh or frozen vegetables  (raw, steamed, roasted, or grilled). Low-sodium or reduced-sodium tomato and vegetable juices. Low-sodium or reduced-sodium tomato sauce and paste. Low-sodium or reduced-sodium canned vegetables.  °Fruits °All fresh, canned (in natural juice), or frozen fruits. °Meat and Other Protein Products °Ground beef (85% or leaner), grass-fed beef, or beef trimmed of fat. Skinless chicken or turkey. Ground chicken or turkey. Pork trimmed of fat. All fish and seafood. Eggs. Dried beans, peas, or lentils. Unsalted nuts and seeds. Unsalted canned beans. °Dairy °Low-fat dairy products, such as skim or 1% milk, 2% or reduced-fat cheeses, low-fat ricotta or cottage cheese, or plain low-fat yogurt. Low-sodium or reduced-sodium cheeses. °Fats and Oils °Tub margarines without trans fats. Light or reduced-fat mayonnaise and salad dressings (reduced sodium). Avocado. Safflower, olive, or canola oils. Natural peanut or almond butter. °Other °Unsalted popcorn and pretzels. °The items listed above may not be a complete list of recommended foods or beverages. Contact your dietitian for more options. °WHAT FOODS ARE NOT RECOMMENDED? °Grains °White bread. White pasta. White rice. Refined cornbread. Bagels and croissants. Crackers that contain trans fat. °Vegetables °Creamed or fried vegetables. Vegetables in a cheese sauce. Regular canned vegetables. Regular canned tomato sauce and paste. Regular tomato and vegetable juices. °Fruits °Dried fruits. Canned fruit in light or heavy syrup. Fruit juice. °Meat and Other Protein Products °Fatty cuts of meat. Ribs, chicken wings, bacon, sausage, bologna, salami, chitterlings, fatback, hot dogs, bratwurst, and packaged luncheon meats. Salted nuts and seeds. Canned beans with salt. °Dairy °Whole or 2% milk, cream, half-and-half, and cream cheese. Whole-fat or sweetened yogurt. Full-fat   cheeses or blue cheese. Nondairy creamers and whipped toppings. Processed cheese, cheese spreads, or cheese  curds. °Condiments °Onion and garlic salt, seasoned salt, table salt, and sea salt. Canned and packaged gravies. Worcestershire sauce. Tartar sauce. Barbecue sauce. Teriyaki sauce. Soy sauce, including reduced sodium. Steak sauce. Fish sauce. Oyster sauce. Cocktail sauce. Horseradish. Ketchup and mustard. Meat flavorings and tenderizers. Bouillon cubes. Hot sauce. Tabasco sauce. Marinades. Taco seasonings. Relishes. °Fats and Oils °Butter, stick margarine, lard, shortening, ghee, and bacon fat. Coconut, palm kernel, or palm oils. Regular salad dressings. °Other °Pickles and olives. Salted popcorn and pretzels. °The items listed above may not be a complete list of foods and beverages to avoid. Contact your dietitian for more information. °WHERE CAN I FIND MORE INFORMATION? °[employer]: www.nhlbi.nih.gov/health/health-topics/topics/dash/ °Document Released: 10/03/2011 Document Revised: 02/28/2014 Document Reviewed: 08/18/2013 °[employer]® Patient Information ©2015 [employer]. This information is not intended to replace advice given to you by your health care provider. Make sure you discuss any questions you have with your health care provider. ° °

## 2014-07-21 NOTE — Progress Notes (Signed)
Patient here for follow up on his HTN and medication refills

## 2014-07-21 NOTE — Progress Notes (Signed)
MRN: [id number] Name: Henry Walker  Sex: male Age: 53 y.o. DOB: 03/28/1961  Allergies: Review of patient's allergies indicates no known allergies.  Chief Complaint  Patient presents with  . Follow-up    HPI: Patient is 53 y.o. male who has to of hypertension GERD impaired fasting glucose hyperlipidemia comes today for followup, his blood pressure initially was elevated her, repeat manual blood pressure is 135/80, denies any headache dizziness chest and shortness of breath, patient is requesting refill on his medications, he also reported to have history of erectile dysfunction and recently have been having symptoms, patient used to in the past without any side effects, patient denies any significant cardiac history.  Past Medical History  Diagnosis Date  . Hypertension   . Diabetes mellitus without complication     Past Surgical History  Procedure Laterality Date  . Back surgery        Medication List       This list is accurate as of: 07/21/14  9:48 AM.  Always use your most recent med list.               amLODipine 10 MG tablet  Commonly known as:  NORVASC  Take 1 tablet (10 mg total) by mouth daily.     atorvastatin 10 MG tablet  Commonly known as:  LIPITOR  Take 1 tablet (10 mg total) by mouth daily.     hydrALAZINE 25 MG tablet  Commonly known as:  APRESOLINE  Take 1 tablet (25 mg total) by mouth 3 (three) times daily.     hydrochlorothiazide 12.5 MG capsule  Commonly known as:  MICROZIDE  Take 1 capsule (12.5 mg total) by mouth daily.     metoprolol 50 MG tablet  Commonly known as:  LOPRESSOR  Take 1 tablet (50 mg total) by mouth 2 (two) times daily.     omeprazole 20 MG capsule  Commonly known as:  PRILOSEC  Take 1 capsule (20 mg total) by mouth daily.     ondansetron 4 MG disintegrating tablet  Commonly known as:  ZOFRAN ODT  Take 1 tablet (4 mg total) by mouth every 8 (eight) hours as needed for nausea or vomiting.     oxyCODONE-acetaminophen 5-325 MG per tablet  Commonly known as:  PERCOCET/ROXICET  Take 2 tablets by mouth every 4 (four) hours as needed for severe pain.     tadalafil 20 MG tablet  Commonly known as:  CIALIS  Take 0.5-1 tablets (10-20 mg total) by mouth every other day as needed for erectile dysfunction.        Meds ordered this encounter  Medications  . hydrALAZINE (APRESOLINE) 25 MG tablet    Sig: Take 1 tablet (25 mg total) by mouth 3 (three) times daily.    Dispense:  270 tablet    Refill:  1  . metoprolol (LOPRESSOR) 50 MG tablet    Sig: Take 1 tablet (50 mg total) by mouth 2 (two) times daily.    Dispense:  180 tablet    Refill:  1  . omeprazole (PRILOSEC) 20 MG capsule    Sig: Take 1 capsule (20 mg total) by mouth daily.    Dispense:  30 capsule    Refill:  3  . tadalafil (CIALIS) 20 MG tablet    Sig: Take 0.5-1 tablets (10-20 mg total) by mouth every other day as needed for erectile dysfunction.    Dispense:  5 tablet    Refill:  3  Immunization History  Administered Date(s) Administered  . Influenza,inj,Quad PF,36+ Mos 10/12/2013    History reviewed. No pertinent family history.  History  Substance Use Topics  . Smoking status: Current Every Day Smoker -- 0.50 packs/day    Types: Cigarettes  . Smokeless tobacco: Not on file  . Alcohol Use: No    Review of Systems   As noted in HPI  Filed Vitals:   07/21/14 0922  BP: 153/92  Pulse: 71  Temp: 98.6 F (37 C)  Resp: 16    Physical Exam  Physical Exam  Constitutional: No distress.  Eyes: EOM are normal. Pupils are equal, round, and reactive to light.  Cardiovascular: Normal rate and regular rhythm.   Pulmonary/Chest: No respiratory distress. He has no wheezes. He has no rales.  Abdominal: Soft. He exhibits no distension. There is no tenderness. There is no rebound.  Musculoskeletal: He exhibits no edema.    CBC    Component Value Date/Time   WBC 11.4* 10/19/2013 1810   RBC 5.45  10/19/2013 1810   HGB 16.7 10/19/2013 1810   HCT 46.1 10/19/2013 1810   PLT 222 10/19/2013 1810   MCV 84.6 10/19/2013 1810   LYMPHSABS 1.3 10/19/2013 1810   MONOABS 0.9 10/19/2013 1810   EOSABS 0.0 10/19/2013 1810   BASOSABS 0.0 10/19/2013 1810    CMP     Component Value Date/Time   NA 140 01/10/2014 1304   K 3.8 01/10/2014 1304   CL 103 01/10/2014 1304   CO2 27 01/10/2014 1304   GLUCOSE 108* 01/10/2014 1304   BUN 15 01/10/2014 1304   CREATININE 1.21 01/10/2014 1304   CREATININE 1.53* 10/19/2013 1810   CALCIUM 10.3 01/10/2014 1304   PROT 7.4 01/10/2014 1304   ALBUMIN 4.4 01/10/2014 1304   AST 24 01/10/2014 1304   ALT 38 01/10/2014 1304   ALKPHOS 58 01/10/2014 1304   BILITOT 0.4 01/10/2014 1304   GFRNONAA 68 01/10/2014 1304   GFRNONAA 50* 10/19/2013 1810   GFRAA 79 01/10/2014 1304   GFRAA 58* 10/19/2013 1810    Lab Results  Component Value Date/Time   CHOL 178 01/10/2014  1:04 PM    No components found with this basename: hga1c    Lab Results  Component Value Date/Time   AST 24 01/10/2014  1:04 PM    Assessment and Plan  Essential hypertension - Plan: Continue with hydrALAZINE (APRESOLINE) 25 MG tablet, metoprolol (LOPRESSOR) 50 MG tablet, also advise for DASH diet. Will repeat blood chemistry.  Gastroesophageal reflux disease with esophagitis - Plan: Lifestyle modification continue with omeprazole (PRILOSEC) 20 MG capsule  Other and unspecified hyperlipidemia - Plan: Currently patient is on Lipitor 10 mg, repeat Lipid panel  Special screening for malignant neoplasms, colon - Plan: Ambulatory referral to Gastroenterology  IFG (impaired fasting glucose) - Plan: Advised for low carbohydrate diet, repeat her COMPLETE METABOLIC PANEL WITH GFR  Tobacco use disorder Patient is to smoke cigarettes, I have advised patient quit smoking.  Erectile dysfunction, unspecified erectile dysfunction type - Plan: Prescribed tadalafil (CIALIS) 20 MG tablet, patient denies any history of  chest pain or heart disease, patient is advised not to take this medication along with his blood pressure medications, patient understand and verbalized instructions.   Health Maintenance -Colonoscopy:  Patient declined flu shot   Return in about 3 months (around 10/20/2014) for hypertension, hyperipidemia.  Lorayne Marek, MD

## 2014-07-22 ENCOUNTER — Encounter: Payer: Self-pay | Admitting: Internal Medicine

## 2014-07-25 ENCOUNTER — Telehealth: Payer: Self-pay | Admitting: *Deleted

## 2014-07-25 NOTE — Telephone Encounter (Signed)
Message copied by Joan Mayans on Mon Jul 25, 2014 11:01 AM ------      Message from: Lorayne Marek      Created: Fri Jul 22, 2014 12:10 PM       Blood work reviewed noticed impaired fasting glucose and elevated triglycerides, call and advise patient for low carbohydrate and low-fat diet.       ------

## 2014-07-25 NOTE — Telephone Encounter (Signed)
Pt is aware of his lab results. 

## 2014-08-13 ENCOUNTER — Emergency Department (HOSPITAL_COMMUNITY): Payer: Medicare Other

## 2014-08-13 ENCOUNTER — Emergency Department (HOSPITAL_COMMUNITY)
Admission: EM | Admit: 2014-08-13 | Discharge: 2014-08-13 | Disposition: A | Discharge status: Home / Self Care | Payer: Medicare Other | Attending: Emergency Medicine | Admitting: Emergency Medicine

## 2014-08-13 ENCOUNTER — Encounter (HOSPITAL_COMMUNITY): Payer: Self-pay | Admitting: Emergency Medicine

## 2014-08-13 DIAGNOSIS — R51 Headache: Secondary | ICD-10-CM | POA: Insufficient documentation

## 2014-08-13 DIAGNOSIS — J069 Acute upper respiratory infection, unspecified: Secondary | ICD-10-CM | POA: Diagnosis not present

## 2014-08-13 DIAGNOSIS — Z79899 Other long term (current) drug therapy: Secondary | ICD-10-CM | POA: Insufficient documentation

## 2014-08-13 DIAGNOSIS — Z7951 Long term (current) use of inhaled steroids: Secondary | ICD-10-CM | POA: Diagnosis not present

## 2014-08-13 DIAGNOSIS — Z720 Tobacco use: Secondary | ICD-10-CM | POA: Diagnosis not present

## 2014-08-13 DIAGNOSIS — E119 Type 2 diabetes mellitus without complications: Secondary | ICD-10-CM | POA: Diagnosis not present

## 2014-08-13 DIAGNOSIS — I10 Essential (primary) hypertension: Secondary | ICD-10-CM | POA: Insufficient documentation

## 2014-08-13 DIAGNOSIS — B9789 Other viral agents as the cause of diseases classified elsewhere: Secondary | ICD-10-CM

## 2014-08-13 DIAGNOSIS — R5383 Other fatigue: Secondary | ICD-10-CM | POA: Insufficient documentation

## 2014-08-13 DIAGNOSIS — R05 Cough: Secondary | ICD-10-CM | POA: Diagnosis present

## 2014-08-13 MED ORDER — AEROCHAMBER PLUS W/MASK MISC
1.0000 | Freq: Once | Status: AC
  Administered 2014-08-13: 1
  Filled 2014-08-13: qty 1

## 2014-08-13 MED ORDER — HYDROCODONE-HOMATROPINE 5-1.5 MG/5ML PO SYRP: 5.0000 mL | ORAL_SOLUTION | Freq: Four times a day (QID) | ORAL | Status: DC | PRN

## 2014-08-13 MED ORDER — ALBUTEROL SULFATE HFA 108 (90 BASE) MCG/ACT IN AERS
2.0000 | INHALATION_SPRAY | RESPIRATORY_TRACT | Status: DC | PRN
  Administered 2014-08-13: 2 via RESPIRATORY_TRACT
  Filled 2014-08-13: qty 6.7

## 2014-08-13 MED ORDER — FLUTICASONE PROPIONATE 50 MCG/ACT NA SUSP: 2.0000 | Freq: Every day | NASAL | Status: DC

## 2014-08-13 NOTE — ED Provider Notes (Signed)
CSN: [id number]     Arrival date & time 08/13/14  1412 History   First MD Initiated Contact with Patient 08/13/14 1547     Chief Complaint  Patient presents with  . URI     (Consider location/radiation/quality/duration/timing/severity/associated sxs/prior Treatment) Patient is a 53 y.o. male presenting with URI. The history is provided by the patient and medical records. No language interpreter was used.  URI Presenting symptoms: congestion, cough, fatigue, rhinorrhea and sore throat   Presenting symptoms: no ear pain and no fever   Associated symptoms: headaches (generalized, throbbing)   Associated symptoms: no arthralgias, no myalgias and no wheezing     Henry Walker is a 53 y.o. male  with a hx of HTN, NIDDM presents to the Emergency Department complaining of gradual, persistent, progressively worsening URI symptoms including sinus congestion, cough, rhinorrhea, scratchy throat, generalized throbbing headache onset 1 week ago.  She reports taking over-the-counter medications including Robitussin and Mucinex without significant relief. No aggravating or alleviating factors.  Patient reports he smokes half to one pack of cigarettes per day.  Pt denies fever, chills, neck pain, chest pain, abdominal pain, nausea, vomiting, diarrhea, weakness, dizziness, syncope.     Past Medical History  Diagnosis Date  . Hypertension   . Diabetes mellitus without complication    Past Surgical History  Procedure Laterality Date  . Back surgery     History reviewed. No pertinent family history. History  Substance Use Topics  . Smoking status: Current Every Day Smoker -- 0.50 packs/day    Types: Cigarettes  . Smokeless tobacco: Not on file  . Alcohol Use: No    Review of Systems  Constitutional: Positive for fatigue. Negative for fever, chills and appetite change.  HENT: Positive for congestion, postnasal drip, rhinorrhea, sinus pressure and sore throat. Negative for ear discharge,  ear pain and mouth sores.   Eyes: Negative for visual disturbance.  Respiratory: Positive for cough and chest tightness. Negative for shortness of breath, wheezing and stridor.   Cardiovascular: Negative for chest pain, palpitations and leg swelling.  Gastrointestinal: Negative for nausea, vomiting, abdominal pain and diarrhea.  Genitourinary: Negative for dysuria, urgency, frequency and hematuria.  Musculoskeletal: Negative for arthralgias, back pain, myalgias and neck stiffness.  Skin: Negative for rash.  Neurological: Positive for headaches (generalized, throbbing). Negative for syncope, light-headedness and numbness.  Hematological: Negative for adenopathy.  Psychiatric/Behavioral: The patient is not nervous/anxious.   All other systems reviewed and are negative.     Allergies  Review of patient's allergies indicates no known allergies.  Home Medications   Prior to Admission medications   Medication Sig Start Date End Date Taking? Authorizing Provider  amLODipine (NORVASC) 10 MG tablet Take 1 tablet (10 mg total) by mouth daily. 04/12/14  Yes Lorayne Marek, MD  atorvastatin (LIPITOR) 10 MG tablet Take 1 tablet (10 mg total) by mouth daily. 04/12/14  Yes Lorayne Marek, MD  hydrALAZINE (APRESOLINE) 25 MG tablet Take 1 tablet (25 mg total) by mouth 3 (three) times daily. 07/21/14  Yes Lorayne Marek, MD  hydrochlorothiazide (MICROZIDE) 12.5 MG capsule Take 1 capsule (12.5 mg total) by mouth daily. 04/12/14  Yes Lorayne Marek, MD  metoprolol (LOPRESSOR) 50 MG tablet Take 1 tablet (50 mg total) by mouth 2 (two) times daily. 07/21/14  Yes Lorayne Marek, MD  omeprazole (PRILOSEC) 20 MG capsule Take 1 capsule (20 mg total) by mouth daily. 07/21/14  Yes Deepak Advani, MD  ondansetron (ZOFRAN ODT) 4 MG disintegrating tablet Take 1 tablet (  4 mg total) by mouth every 8 (eight) hours as needed for nausea or vomiting. 10/19/13  Yes Freddi Che, MD  tadalafil (CIALIS) 20 MG tablet Take 0.5-1 tablets  (10-20 mg total) by mouth every other day as needed for erectile dysfunction. 07/21/14  Yes Deepak Advani, MD  fluticasone (FLONASE) 50 MCG/ACT nasal spray Place 2 sprays into both nostrils daily. 08/13/14   Jahmiyah Dullea, PA-C  HYDROcodone-homatropine (HYCODAN) 5-1.5 MG/5ML syrup Take 5 mLs by mouth every 6 (six) hours as needed for cough. 08/13/14   Kariel Skillman, PA-C   BP 128/75  Pulse 80  Temp(Src) 98.4 F (36.9 C) (Oral)  Resp 18  SpO2 95% Physical Exam  Nursing note and vitals reviewed. Constitutional: He is oriented to person, place, and time. He appears well-developed and well-nourished. No distress.  HENT:  Head: Normocephalic and atraumatic.  Right Ear: Tympanic membrane, external ear and ear canal normal.  Left Ear: Tympanic membrane, external ear and ear canal normal.  Nose: Mucosal edema and rhinorrhea present. No epistaxis. Right sinus exhibits no maxillary sinus tenderness and no frontal sinus tenderness. Left sinus exhibits no maxillary sinus tenderness and no frontal sinus tenderness.  Mouth/Throat: Uvula is midline and mucous membranes are normal. Mucous membranes are not pale and not cyanotic. Posterior oropharyngeal erythema present. No oropharyngeal exudate, posterior oropharyngeal edema or tonsillar abscesses.  Mild posterior oropharyngeal erythema without exudate or edema  Eyes: Conjunctivae are normal. Pupils are equal, round, and reactive to light.  Neck: Normal range of motion and full passive range of motion without pain.  No stridor Handling secretions without difficulty  Cardiovascular: Normal rate, normal heart sounds and intact distal pulses.   No murmur heard. Pulmonary/Chest: Effort normal and breath sounds normal. No stridor.  Clear and equal breath sounds without focal wheezes, rhonchi, rales  Abdominal: Soft. Bowel sounds are normal. There is no tenderness.  Musculoskeletal: Normal range of motion.  Lymphadenopathy:       Head (right  side): No submental, no submandibular, no tonsillar, no preauricular, no posterior auricular and no occipital adenopathy present.       Head (left side): No submental, no submandibular, no tonsillar, no preauricular, no posterior auricular and no occipital adenopathy present.    He has no cervical adenopathy.       Right cervical: No superficial cervical, no deep cervical and no posterior cervical adenopathy present.      Left cervical: No superficial cervical, no deep cervical and no posterior cervical adenopathy present.  Neurological: He is alert and oriented to person, place, and time.  Skin: Skin is warm and dry. No rash noted. He is not diaphoretic.  Psychiatric: He has a normal mood and affect.    ED Course  Procedures (including critical care time) Labs Review Labs Reviewed - No data to display  Imaging Review Dg Chest 2 View  08/13/2014   CLINICAL DATA:  Productive cough, congestion, headache for 1 week  EXAM: CHEST  2 VIEW  COMPARISON:  10/19/2013  FINDINGS: The heart size and mediastinal contours are within normal limits. Both lungs are clear. The visualized skeletal structures are unremarkable.  IMPRESSION: No active cardiopulmonary disease.   Electronically Signed   By: Kathreen Devoid   On: 08/13/2014 16:27     EKG Interpretation None      MDM   Final diagnoses:  Viral URI with cough   [facility] presents with URI symptoms.  Pt CXR negative for acute infiltrate. Patients symptoms are consistent with  URI, likely viral etiology. Discussed that antibiotics are not indicated for viral infections. Pt will be discharged with symptomatic treatment.  Verbalizes understanding and is agreeable with plan. Pt is hemodynamically stable & in NAD prior to dc.  BP 128/75  Pulse 80  Temp(Src) 98.4 F (36.9 C) (Oral)  Resp 18  SpO2 95%    Abigail Butts, PA-C 08/13/14 1703

## 2014-08-13 NOTE — ED Notes (Signed)
pts vital signs updated pt awaiting discharge paperwork at bedside.  

## 2014-08-13 NOTE — Discharge Instructions (Signed)
1. Medications: flonase NS, mucinex, hycodan, albuterol inhaler, usual home medications 2. Treatment: rest, drink plenty of fluids, take tylenol or ibuprofen for fever control 3. Follow Up: Please followup with your primary doctor in 3 days for discussion of your diagnoses and further evaluation after today's visit; Return the emergency room for difficulty breathing, high fevers or other concerning symptoms    Upper Respiratory Infection, Adult An upper respiratory infection (URI) is also sometimes known as the common cold. The upper respiratory tract includes the nose, sinuses, throat, trachea, and bronchi. Bronchi are the airways leading to the lungs. Most people improve within 1 week, but symptoms can last up to 2 weeks. A residual cough may last even longer.  CAUSES Many different viruses can infect the tissues lining the upper respiratory tract. The tissues become irritated and inflamed and often become very moist. Mucus production is also common. A cold is contagious. You can easily spread the virus to others by oral contact. This includes kissing, sharing a glass, coughing, or sneezing. Touching your mouth or nose and then touching a surface, which is then touched by another person, can also spread the virus. SYMPTOMS  Symptoms typically develop 1 to 3 days after you come in contact with a cold virus. Symptoms vary from person to person. They may include:  Runny nose.  Sneezing.  Nasal congestion.  Sinus irritation.  Sore throat.  Loss of voice (laryngitis).  Cough.  Fatigue.  Muscle aches.  Loss of appetite.  Headache.  Low-grade fever. DIAGNOSIS  You might diagnose your own cold based on familiar symptoms, since most people get a cold 2 to 3 times a year. Your caregiver can confirm this based on your exam. Most importantly, your caregiver can check that your symptoms are not due to another disease such as strep throat, sinusitis, pneumonia, asthma, or epiglottitis. Blood  tests, throat tests, and X-rays are not necessary to diagnose a common cold, but they may sometimes be helpful in excluding other more serious diseases. Your caregiver will decide if any further tests are required. RISKS AND COMPLICATIONS  You may be at risk for a more severe case of the common cold if you smoke cigarettes, have chronic heart disease (such as heart failure) or lung disease (such as asthma), or if you have a weakened immune system. The very young and very old are also at risk for more serious infections. Bacterial sinusitis, middle ear infections, and bacterial pneumonia can complicate the common cold. The common cold can worsen asthma and chronic obstructive pulmonary disease (COPD). Sometimes, these complications can require emergency medical care and may be life-threatening. PREVENTION  The best way to protect against getting a cold is to practice good hygiene. Avoid oral or hand contact with people with cold symptoms. Wash your hands often if contact occurs. There is no clear evidence that vitamin C, vitamin E, echinacea, or exercise reduces the chance of developing a cold. However, it is always recommended to get plenty of rest and practice good nutrition. TREATMENT  Treatment is directed at relieving symptoms. There is no cure. Antibiotics are not effective, because the infection is caused by a virus, not by bacteria. Treatment may include:  Increased fluid intake. Sports drinks offer valuable electrolytes, sugars, and fluids.  Breathing heated mist or steam (vaporizer or shower).  Eating chicken soup or other clear broths, and maintaining good nutrition.  Getting plenty of rest.  Using gargles or lozenges for comfort.  Controlling fevers with ibuprofen or acetaminophen as directed  by your caregiver.  Increasing usage of your inhaler if you have asthma. Zinc gel and zinc lozenges, taken in the first 24 hours of the common cold, can shorten the duration and lessen the  severity of symptoms. Pain medicines may help with fever, muscle aches, and throat pain. A variety of non-prescription medicines are available to treat congestion and runny nose. Your caregiver can make recommendations and may suggest nasal or lung inhalers for other symptoms.  HOME CARE INSTRUCTIONS   Only take over-the-counter or prescription medicines for pain, discomfort, or fever as directed by your caregiver.  Use a warm mist humidifier or inhale steam from a shower to increase air moisture. This may keep secretions moist and make it easier to breathe.  Drink enough water and fluids to keep your urine clear or pale yellow.  Rest as needed.  Return to work when your temperature has returned to normal or as your caregiver advises. You may need to stay home longer to avoid infecting others. You can also use a face mask and careful hand washing to prevent spread of the virus. SEEK MEDICAL CARE IF:   After the first few days, you feel you are getting worse rather than better.  You need your caregiver's advice about medicines to control symptoms.  You develop chills, worsening shortness of breath, or brown or red sputum. These may be signs of pneumonia.  You develop yellow or brown nasal discharge or pain in the face, especially when you bend forward. These may be signs of sinusitis.  You develop a fever, swollen neck glands, pain with swallowing, or white areas in the back of your throat. These may be signs of strep throat. SEEK IMMEDIATE MEDICAL CARE IF:   You have a fever.  You develop severe or persistent headache, ear pain, sinus pain, or chest pain.  You develop wheezing, a prolonged cough, cough up blood, or have a change in your usual mucus (if you have chronic lung disease).  You develop sore muscles or a stiff neck. Document Released: 04/09/2001 Document Revised: 01/06/2012 Document Reviewed: 01/19/2014 [facility] Patient Information 2015 [place], [place]. This information is  not intended to replace advice given to you by your health care provider. Make sure you discuss any questions you have with your health care provider.

## 2014-08-13 NOTE — ED Notes (Addendum)
Per pt sts congestion, cough, sinus pressure, scratchy throat head pain x 1 week. sts he has been taking OTC meds without relief.

## 2014-08-14 NOTE — ED Provider Notes (Signed)
Medical screening examination/treatment/procedure(s) were performed by non-physician practitioner and as supervising physician I was immediately available for consultation/collaboration.  Richarda Blade, MD 08/14/14 [phone]

## 2014-08-27 ENCOUNTER — Encounter (HOSPITAL_COMMUNITY): Payer: Self-pay | Admitting: Emergency Medicine

## 2014-08-27 ENCOUNTER — Emergency Department (HOSPITAL_COMMUNITY)
Admission: EM | Admit: 2014-08-27 | Discharge: 2014-08-27 | Disposition: A | Discharge status: Home / Self Care | Payer: Medicare Other | Attending: Emergency Medicine | Admitting: Emergency Medicine

## 2014-08-27 DIAGNOSIS — Z79899 Other long term (current) drug therapy: Secondary | ICD-10-CM | POA: Insufficient documentation

## 2014-08-27 DIAGNOSIS — R05 Cough: Secondary | ICD-10-CM | POA: Diagnosis present

## 2014-08-27 DIAGNOSIS — I10 Essential (primary) hypertension: Secondary | ICD-10-CM | POA: Diagnosis not present

## 2014-08-27 DIAGNOSIS — E119 Type 2 diabetes mellitus without complications: Secondary | ICD-10-CM | POA: Diagnosis not present

## 2014-08-27 DIAGNOSIS — Z720 Tobacco use: Secondary | ICD-10-CM | POA: Diagnosis not present

## 2014-08-27 DIAGNOSIS — J069 Acute upper respiratory infection, unspecified: Secondary | ICD-10-CM | POA: Diagnosis not present

## 2014-08-27 DIAGNOSIS — Z7951 Long term (current) use of inhaled steroids: Secondary | ICD-10-CM | POA: Diagnosis not present

## 2014-08-27 DIAGNOSIS — R059 Cough, unspecified: Secondary | ICD-10-CM

## 2014-08-27 LAB — RAPID STREP SCREEN (MED CTR MEBANE ONLY): Streptococcus, Group A Screen (Direct): NEGATIVE

## 2014-08-27 MED ORDER — AMOXICILLIN 500 MG PO CAPS: 500.0000 mg | ORAL_CAPSULE | Freq: Three times a day (TID) | ORAL | Status: DC

## 2014-08-27 MED ORDER — PROMETHAZINE-CODEINE 6.25-10 MG/5ML PO SYRP: 5.0000 mL | ORAL_SOLUTION | Freq: Four times a day (QID) | ORAL | Status: DC | PRN

## 2014-08-27 NOTE — Discharge Instructions (Signed)
Take the prescribed medication as directed. °Follow-up with your primary care physician. °Return to the ED for new or worsening symptoms. ° °

## 2014-08-27 NOTE — ED Notes (Addendum)
Here x 2 weeks ago for resp. Infection; here now b/c symptoms unresolved. Only prescribed Flonase, cough syrup with OxyContin, and inhaler.  Also c/o sore throat.

## 2014-08-27 NOTE — ED Provider Notes (Signed)
Medical screening examination/treatment/procedure(s) were performed by non-physician practitioner and as supervising physician I was immediately available for consultation/collaboration.   EKG Interpretation None        Mariea Clonts, MD 08/27/14 [phone]

## 2014-08-27 NOTE — ED Notes (Signed)
Here 10/17 for same c/o sinus congestion, cough and sore throat. States has not improved. Lungs clear, throat without redness, swelling or exudate.

## 2014-08-27 NOTE — ED Provider Notes (Signed)
CSN: [id number]     Arrival date & time 08/27/14  1223 History   This chart was scribed for non-physician practitioner, Quincy Carnes, PA-C working with Mariea Clonts, MD, by Erling Conte, ED Scribe. This patient was seen in room TR05C/TR05C and the patient's care was started at 12:40 PM.    Chief Complaint  Patient presents with  . URI    HPI HPI Comments: Henry Walker is a 53 y.o. male who presents to the Emergency Department complaining of URI type symptoms. Specifically patient has had dry cough, sore throat, sinus pressure, generalized fatigue, and decreased appetite. He has been drinking fluids normally. Patient was seen in the ED 2 weeks ago for similar symptoms, diagnosed with viral URI and sent home with supportive care. States he has had no improvement since this time despite using Flonase, cough suppressants, and albuterol inhaler. He states he has also taken over-the-counter decongestants with minimal relief. He denies any fever or chills. No known sick contacts. Patient did not receive a flu vaccine this year.  No chest pain, SOB, palpitations, dizziness, weakness, abdominal pain, nausea, vomiting, or diarrhea.  Past Medical History  Diagnosis Date  . Hypertension   . Diabetes mellitus without complication    Past Surgical History  Procedure Laterality Date  . Back surgery     History reviewed. No pertinent family history. History  Substance Use Topics  . Smoking status: Current Every Day Smoker -- 0.50 packs/day    Types: Cigarettes  . Smokeless tobacco: Not on file  . Alcohol Use: No    Review of Systems  HENT: Positive for congestion, sinus pressure and sore throat.   Respiratory: Positive for cough.   All other systems reviewed and are negative.     Allergies  Review of patient's allergies indicates no known allergies.  Home Medications   Prior to Admission medications   Medication Sig Start Date End Date Taking? Authorizing Provider   amLODipine (NORVASC) 10 MG tablet Take 1 tablet (10 mg total) by mouth daily. 04/12/14   Lorayne Marek, MD  atorvastatin (LIPITOR) 10 MG tablet Take 1 tablet (10 mg total) by mouth daily. 04/12/14   Lorayne Marek, MD  fluticasone (FLONASE) 50 MCG/ACT nasal spray Place 2 sprays into both nostrils daily. 08/13/14   Hannah Muthersbaugh, PA-C  hydrALAZINE (APRESOLINE) 25 MG tablet Take 1 tablet (25 mg total) by mouth 3 (three) times daily. 07/21/14   Lorayne Marek, MD  hydrochlorothiazide (MICROZIDE) 12.5 MG capsule Take 1 capsule (12.5 mg total) by mouth daily. 04/12/14   Lorayne Marek, MD  HYDROcodone-homatropine (HYCODAN) 5-1.5 MG/5ML syrup Take 5 mLs by mouth every 6 (six) hours as needed for cough. 08/13/14   Hannah Muthersbaugh, PA-C  metoprolol (LOPRESSOR) 50 MG tablet Take 1 tablet (50 mg total) by mouth 2 (two) times daily. 07/21/14   Lorayne Marek, MD  omeprazole (PRILOSEC) 20 MG capsule Take 1 capsule (20 mg total) by mouth daily. 07/21/14   Lorayne Marek, MD  ondansetron (ZOFRAN ODT) 4 MG disintegrating tablet Take 1 tablet (4 mg total) by mouth every 8 (eight) hours as needed for nausea or vomiting. 10/19/13   Freddi Che, MD  tadalafil (CIALIS) 20 MG tablet Take 0.5-1 tablets (10-20 mg total) by mouth every other day as needed for erectile dysfunction. 07/21/14   Lorayne Marek, MD   Triage Vitals: BP 129/70  Pulse 79  Temp(Src) 97.8 F (36.6 C) (Oral)  Resp 22  SpO2 95%  Physical Exam  Nursing note and  vitals reviewed. Constitutional: He is oriented to person, place, and time. He appears well-developed and well-nourished.  HENT:  Head: Normocephalic and atraumatic.  Right Ear: Tympanic membrane and ear canal normal.  Left Ear: Tympanic membrane and ear canal normal.  Nose: Nose normal. Right sinus exhibits no maxillary sinus tenderness and no frontal sinus tenderness. Left sinus exhibits no maxillary sinus tenderness and no frontal sinus tenderness.  Mouth/Throat: Uvula is midline,  oropharynx is clear and moist and mucous membranes are normal. No oropharyngeal exudate, posterior oropharyngeal erythema or tonsillar abscesses.  Tonsils normal in appearance bilaterally without exudate; uvula midline without peritonsillar abscess; handling secretions appropriately; no difficulty swallowing or speaking  Eyes: Conjunctivae and EOM are normal. Pupils are equal, round, and reactive to light.  Neck: Normal range of motion. Neck supple.  Cardiovascular: Normal rate, regular rhythm and normal heart sounds.   Pulmonary/Chest: Effort normal and breath sounds normal. No respiratory distress.  Abdominal: Soft. Bowel sounds are normal. There is no tenderness. There is no guarding.  Musculoskeletal: Normal range of motion.  Neurological: He is alert and oriented to person, place, and time.  Skin: Skin is warm and dry.  Psychiatric: He has a normal mood and affect.    ED Course  Procedures (including critical care time)  DIAGNOSTIC STUDIES: Oxygen Saturation is 95% on RA, adequate by my interpretation.    COORDINATION OF CARE:    Labs Review Labs Reviewed  RAPID STREP SCREEN  CULTURE, GROUP A STREP    Imaging Review No results found.   EKG Interpretation None      MDM   Final diagnoses:  URI (upper respiratory infection)  Cough   53 year old male with URI type symptoms. Seen in the ED 2 weeks ago and had chest x-ray performed which was negative. He was discharged home with supportive care. States he's had no improvement over the past 2 weeks. On exam, patient afebrile and nontoxic in appearance. His exam is overall noninfectious. Lungs are clear bilaterally without wheezes or rhonchi to suggest pneumonia. Rapid strep negative, culture pending. I have strong suspicion that constellation of symptoms are viral in nature, however patient is adamant that he needs antibiotics.  Will prescribe amoxicillin and refill cough medication. Patient is to follow-up with his primary  care physician next week.  Discussed plan with patient, he/she acknowledged understanding and agreed with plan of care.  Return precautions given for new or worsening symptoms.  I personally performed the services described in this documentation, which was scribed in my presence. The recorded information has been reviewed and is accurate.   Larene Pickett, PA-C 08/27/14 [place], PA-C 08/27/14 1323

## 2014-08-29 LAB — CULTURE, GROUP A STREP

## 2014-09-19 ENCOUNTER — Ambulatory Visit (AMBULATORY_SURGERY_CENTER): Payer: Self-pay | Admitting: *Deleted

## 2014-09-19 VITALS — Ht 67.0 in | Wt 180.2 lb

## 2014-09-19 DIAGNOSIS — Z1211 Encounter for screening for malignant neoplasm of colon: Secondary | ICD-10-CM

## 2014-09-19 NOTE — Progress Notes (Signed)
No egg or soy allergy. ewm No issues with past sedation. ewm No blood thinners, no diet pills. ewm No home 02 use. ewm No e mail for emmi. ewm

## 2014-10-03 ENCOUNTER — Encounter: Payer: Self-pay | Admitting: Internal Medicine

## 2014-10-03 ENCOUNTER — Ambulatory Visit (AMBULATORY_SURGERY_CENTER): Payer: Medicare Other | Admitting: Internal Medicine

## 2014-10-03 VITALS — BP 143/76 | HR 59 | Temp 97.7°F | Resp 13 | Ht 67.0 in | Wt 180.0 lb

## 2014-10-03 DIAGNOSIS — D120 Benign neoplasm of cecum: Secondary | ICD-10-CM

## 2014-10-03 DIAGNOSIS — Z1211 Encounter for screening for malignant neoplasm of colon: Secondary | ICD-10-CM

## 2014-10-03 DIAGNOSIS — D123 Benign neoplasm of transverse colon: Secondary | ICD-10-CM

## 2014-10-03 DIAGNOSIS — D124 Benign neoplasm of descending colon: Secondary | ICD-10-CM

## 2014-10-03 LAB — GLUCOSE, CAPILLARY
GLUCOSE-CAPILLARY: 157 mg/dL — AB (ref 70–99)
Glucose-Capillary: 96 mg/dL (ref 70–99)

## 2014-10-03 MED ORDER — SODIUM CHLORIDE 0.9 % IV SOLN: 500.0000 mL | INTRAVENOUS | Status: DC

## 2014-10-03 NOTE — Patient Instructions (Addendum)
I found and removed 5 polyps - they all look benign. I will let you know pathology results and when to have another routine colonoscopy by mail.  I appreciate the opportunity to care for you. Gatha Mayer, MD, FACGYOU HAD AN ENDOSCOPIC PROCEDURE TODAY AT [facility]: Refer to the procedure report that was given to you for any specific questions about what was found during the examination.  If the procedure report does not answer your questions, please call your gastroenterologist to clarify.  If you requested that your care partner not be given the details of your procedure findings, then the procedure report has been included in a sealed envelope for you to review at your convenience later.  YOU SHOULD EXPECT: Some feelings of bloating in the abdomen. Passage of more gas than usual.  Walking can help get rid of the air that was put into your GI tract during the procedure and reduce the bloating. If you had a lower endoscopy (such as a colonoscopy or flexible sigmoidoscopy) you may notice spotting of blood in your stool or on the toilet paper. If you underwent a bowel prep for your procedure, then you may not have a normal bowel movement for a few days.  DIET: Your first meal following the procedure should be a light meal and then it is ok to progress to your normal diet.  A half-sandwich or bowl of soup is an example of a good first meal.  Heavy or fried foods are harder to digest and may make you feel nauseous or bloated.  Likewise meals heavy in dairy and vegetables can cause extra gas to form and this can also increase the bloating.  Drink plenty of fluids but you should avoid alcoholic beverages for 24 hours.  ACTIVITY: Your care partner should take you home directly after the procedure.  You should plan to take it easy, moving slowly for the rest of the day.  You can resume normal activity the day after the procedure however you should NOT DRIVE or use heavy machinery for  24 hours (because of the sedation medicines used during the test).    SYMPTOMS TO REPORT IMMEDIATELY: A gastroenterologist can be reached at any hour.  During normal business hours, 8:30 AM to 5:00 PM Monday through Friday, call [phone].  After hours and on weekends, please call the GI answering service at [phone] who will take a message and have the physician on call contact you.   Following lower endoscopy (colonoscopy or flexible sigmoidoscopy):  Excessive amounts of blood in the stool  Significant tenderness or worsening of abdominal pains  Swelling of the abdomen that is new, acute  Fever of 100F or higher  FOLLOW UP: If any biopsies were taken you will be contacted by phone or by letter within the next 1-3 weeks.  Call your gastroenterologist if you have not heard about the biopsies in 3 weeks.  Our staff will call the home number listed on your records the next business day following your procedure to check on you and address any questions or concerns that you may have at that time regarding the information given to you following your procedure. This is a courtesy call and so if there is no answer at the home number and we have not heard from you through the emergency physician on call, we will assume that you have returned to your regular daily activities without incident.  SIGNATURES/CONFIDENTIALITY: You and/or your care partner have signed  paperwork which will be entered into your electronic medical record.  These signatures attest to the fact that that the information above on your After Visit Summary has been reviewed and is understood.  Full responsibility of the confidentiality of this discharge information lies with you and/or your care-partner.  Polyp information given.

## 2014-10-03 NOTE — Progress Notes (Signed)
Called to room to assist during endoscopic procedure.  Patient ID and intended procedure confirmed with present staff. Received instructions for my participation in the procedure from the performing physician.  

## 2014-10-03 NOTE — Progress Notes (Signed)
A/ox3, pleased with MAC, report to RN 

## 2014-10-04 ENCOUNTER — Telehealth: Payer: Self-pay | Admitting: *Deleted

## 2014-10-04 NOTE — Telephone Encounter (Signed)
  Follow up Call-  Call back number 10/03/2014  Post procedure Call Back phone  # [phone] hm  Permission to leave phone message Yes     Patient questions:  Do you have a fever, pain , or abdominal swelling? No. Pain Score  0 *  Have you tolerated food without any problems? Yes.    Have you been able to return to your normal activities? Yes.    Do you have any questions about your discharge instructions: Diet   No. Medications  No. Follow up visit  No.  Do you have questions or concerns about your Care? No.  Actions: * If pain score is 4 or above: No action needed, pain <4.

## 2014-10-05 NOTE — Op Note (Signed)
[place]  [address] [place], [id number]   COLONOSCOPY PROCEDURE REPORT  PATIENT: Henry Walker, Henry Walker  MR#: [id number] BIRTHDATE: [birth year] , 17  yrs. old GENDER: male ENDOSCOPIST: Gatha Mayer, MD, [facility] PROCEDURE DATE:  10/03/2014 PROCEDURE:   Colonoscopy with snare polypectomy First Screening Colonoscopy - Avg.  risk and is 50 yrs.  old or older Yes.  Prior Negative Screening - Now for repeat screening. N/A  History of Adenoma - Now for follow-up colonoscopy & has been > or = to 3 yrs.  N/A  Polyps Removed Today? Yes. ASA CLASS:   Class II INDICATIONS:first colonoscopy and average risk for colorectal cancer. MEDICATIONS: Propofol 250 mg IV and Monitored anesthesia care  DESCRIPTION OF PROCEDURE:   After the risks benefits and alternatives of the procedure were thoroughly explained, informed consent was obtained.  The digital rectal exam revealed no abnormalities of the rectum, revealed no prostatic nodules, and revealed the prostate was not enlarged.   The LB [id number] endoscope was introduced through the anus and advanced to the cecum, which was identified by both the appendix and ileocecal valve. No adverse events experienced.   The quality of the prep was good, using MiraLax  The instrument was then slowly withdrawn as the colon was fully examined.   COLON FINDINGS: 1) Diminutive cecal,transverse and descending polyps (3 total) completely removed with cold snare.  2) 15 mm descending polyp - pedunculated - completely removed hot snare 3) 10 mm pedunculated polyp completely removed hot snare 4) Otherwise normal colon with right retroflex exam.  All polyps sent to pathology. Retroflexed views revealed internal hemorrhoids. The time to cecum=1 minutes 45 seconds.  Withdrawal time=15 minutes 32 seconds. The scope was withdrawn and the procedure completed. COMPLICATIONS: There were no immediate complications.  ENDOSCOPIC IMPRESSION: 1)  Diminutive cecal,transverse and descending polyps (3 total) completely removed 2) 15 mm descending polyp - pedunculated - completely removed hot snare 3) 10 mm pedunculated polyp completely removed hot snare 4) Otherwise normal colon with internal hemorrhoids in rectum, good prep - first colonoscopy  RECOMMENDATIONS: 1.  Hold Aspirin and all other NSAIDS for 2 weeks. 2.  Timing of repeat colonoscopy will be determined by pathology findings.  eSigned:  Gatha Mayer, MD, [facility] 10/03/2014 10:52 AM   cc: Dr. Kelly Splinter and The Patient

## 2014-10-06 ENCOUNTER — Encounter: Payer: Self-pay | Admitting: Internal Medicine

## 2014-10-06 DIAGNOSIS — Z860101 Personal history of adenomatous and serrated colon polyps: Secondary | ICD-10-CM | POA: Insufficient documentation

## 2014-10-06 DIAGNOSIS — Z86010 Personal history of colonic polyps: Secondary | ICD-10-CM

## 2014-10-06 HISTORY — DX: Personal history of colonic polyps: Z86.010

## 2014-10-06 HISTORY — DX: Personal history of adenomatous and serrated colon polyps: Z86.0101

## 2014-10-06 NOTE — Progress Notes (Signed)
Quick Note:  5 adenomas max 15 mm - repeat colonoscopy 2018/19 ______

## 2015-01-05 ENCOUNTER — Ambulatory Visit: Payer: Medicare Other | Admitting: Internal Medicine

## 2015-01-06 ENCOUNTER — Telehealth: Payer: Self-pay | Admitting: Internal Medicine

## 2015-01-06 ENCOUNTER — Ambulatory Visit: Payer: Medicare Other | Admitting: Internal Medicine

## 2015-01-06 NOTE — Telephone Encounter (Signed)
Patient has come in today for an appointment, however the patient had to be rescheduled due to provider schedule change; patient is requesting to have a medication refill on medications Omeprazole (20mg ), Hydrochlorothiazide (12.5 mg), Amlodipine Besylate (10 mg) and Atorvastatin (10mg ); please f/u with patient about this request. Patient uses [facility] on [address];

## 2015-01-09 ENCOUNTER — Telehealth: Payer: Self-pay | Admitting: Emergency Medicine

## 2015-01-13 ENCOUNTER — Ambulatory Visit: Payer: Medicare Other | Attending: Internal Medicine | Admitting: Internal Medicine

## 2015-01-13 ENCOUNTER — Encounter: Payer: Self-pay | Admitting: Internal Medicine

## 2015-01-13 VITALS — BP 130/70 | HR 67 | Temp 98.0°F | Resp 16 | Wt 184.0 lb

## 2015-01-13 DIAGNOSIS — I10 Essential (primary) hypertension: Secondary | ICD-10-CM | POA: Insufficient documentation

## 2015-01-13 DIAGNOSIS — L739 Follicular disorder, unspecified: Secondary | ICD-10-CM | POA: Diagnosis not present

## 2015-01-13 DIAGNOSIS — K2100 Gastro-esophageal reflux disease with esophagitis, without bleeding: Secondary | ICD-10-CM

## 2015-01-13 DIAGNOSIS — E119 Type 2 diabetes mellitus without complications: Secondary | ICD-10-CM | POA: Insufficient documentation

## 2015-01-13 DIAGNOSIS — E7800 Pure hypercholesterolemia, unspecified: Secondary | ICD-10-CM

## 2015-01-13 DIAGNOSIS — Z720 Tobacco use: Secondary | ICD-10-CM | POA: Diagnosis not present

## 2015-01-13 LAB — POCT GLYCOSYLATED HEMOGLOBIN (HGB A1C): HEMOGLOBIN A1C: 6.6

## 2015-01-13 MED ORDER — HYDROCHLOROTHIAZIDE 12.5 MG PO CAPS: 12.5000 mg | ORAL_CAPSULE | Freq: Every day | ORAL | Status: DC

## 2015-01-13 MED ORDER — ATORVASTATIN CALCIUM 10 MG PO TABS: 10.0000 mg | ORAL_TABLET | Freq: Every day | ORAL | Status: DC

## 2015-01-13 MED ORDER — METFORMIN HCL 500 MG PO TABS: 500.0000 mg | ORAL_TABLET | Freq: Every day | ORAL | Status: DC

## 2015-01-13 MED ORDER — CEPHALEXIN 500 MG PO CAPS: 500.0000 mg | ORAL_CAPSULE | Freq: Three times a day (TID) | ORAL | Status: DC

## 2015-01-13 MED ORDER — HYDRALAZINE HCL 25 MG PO TABS: 25.0000 mg | ORAL_TABLET | Freq: Three times a day (TID) | ORAL | Status: DC

## 2015-01-13 MED ORDER — METOPROLOL TARTRATE 50 MG PO TABS: 50.0000 mg | ORAL_TABLET | Freq: Two times a day (BID) | ORAL | Status: DC

## 2015-01-13 MED ORDER — AMLODIPINE BESYLATE 10 MG PO TABS: 10.0000 mg | ORAL_TABLET | Freq: Every day | ORAL | Status: DC

## 2015-01-13 MED ORDER — OMEPRAZOLE 20 MG PO CPDR: 20.0000 mg | DELAYED_RELEASE_CAPSULE | Freq: Every day | ORAL | Status: DC

## 2015-01-13 NOTE — Patient Instructions (Signed)
Diabetes Mellitus and Food It is important for you to manage your blood sugar (glucose) level. Your blood glucose level can be greatly affected by what you eat. Eating healthier foods in the appropriate amounts throughout the day at about the same time each day will help you control your blood glucose level. It can also help slow or prevent worsening of your diabetes mellitus. Healthy eating may even help you improve the level of your blood pressure and reach or maintain a healthy weight.  HOW CAN FOOD AFFECT ME? Carbohydrates Carbohydrates affect your blood glucose level more than any other type of food. Your dietitian will help you determine how many carbohydrates to eat at each meal and teach you how to count carbohydrates. Counting carbohydrates is important to keep your blood glucose at a healthy level, especially if you are using insulin or taking certain medicines for diabetes mellitus. Alcohol Alcohol can cause sudden decreases in blood glucose (hypoglycemia), especially if you use insulin or take certain medicines for diabetes mellitus. Hypoglycemia can be a life-threatening condition. Symptoms of hypoglycemia (sleepiness, dizziness, and disorientation) are similar to symptoms of having too much alcohol.  If your health care provider has given you approval to drink alcohol, do so in moderation and use the following guidelines:  Women should not have more than one drink per day, and men should not have more than two drinks per day. One drink is equal to:  12 oz of beer.  5 oz of wine.  1 oz of hard liquor.  Do not drink on an empty stomach.  Keep yourself hydrated. Have water, diet soda, or unsweetened iced tea.  Regular soda, juice, and other mixers might contain a lot of carbohydrates and should be counted. WHAT FOODS ARE NOT RECOMMENDED? As you make food choices, it is important to remember that all foods are not the same. Some foods have fewer nutrients per serving than other  foods, even though they might have the same number of calories or carbohydrates. It is difficult to get your body what it needs when you eat foods with fewer nutrients. Examples of foods that you should avoid that are high in calories and carbohydrates but low in nutrients include:  Trans fats (most processed foods list trans fats on the Nutrition Facts label).  Regular soda.  Juice.  Candy.  Sweets, such as cake, pie, doughnuts, and cookies.  Fried foods. WHAT FOODS CAN I EAT? Have nutrient-rich foods, which will nourish your body and keep you healthy. The food you should eat also will depend on several factors, including:  The calories you need.  The medicines you take.  Your weight.  Your blood glucose level.  Your blood pressure level.  Your cholesterol level. You also should eat a variety of foods, including:  Protein, such as meat, poultry, fish, tofu, nuts, and seeds (lean animal proteins are best).  Fruits.  Vegetables.  Dairy products, such as milk, cheese, and yogurt (low fat is best).  Breads, grains, pasta, cereal, rice, and beans.  Fats such as olive oil, trans fat-free margarine, canola oil, avocado, and olives. DOES EVERYONE WITH DIABETES MELLITUS HAVE THE SAME MEAL PLAN? Because every person with diabetes mellitus is different, there is not one meal plan that works for everyone. It is very important that you meet with a dietitian who will help you create a meal plan that is just right for you. Document Released: 07/11/2005 Document Revised: 10/19/2013 Document Reviewed: 09/10/2013 ExitCare Patient Information 2015 [employer]. This   information is not intended to replace advice given to you by your health care provider. Make sure you discuss any questions you have with your health care provider. DASH Eating Plan DASH stands for "Dietary Approaches to Stop Hypertension." The DASH eating plan is a healthy eating plan that has been shown to reduce high  blood pressure (hypertension). Additional health benefits may include reducing the risk of type 2 diabetes mellitus, heart disease, and stroke. The DASH eating plan may also help with weight loss. WHAT DO I NEED TO KNOW ABOUT THE DASH EATING PLAN? For the DASH eating plan, you will follow these general guidelines:  Choose foods with a percent daily value for sodium of less than 5% (as listed on the food label).  Use salt-free seasonings or herbs instead of table salt or sea salt.  Check with your health care provider or pharmacist before using salt substitutes.  Eat lower-sodium products, often labeled as "lower sodium" or "no salt added."  Eat fresh foods.  Eat more vegetables, fruits, and low-fat dairy products.  Choose whole grains. Look for the word "whole" as the first word in the ingredient list.  Choose fish and skinless chicken or turkey more often than red meat. Limit fish, poultry, and meat to 6 oz (170 g) each day.  Limit sweets, desserts, sugars, and sugary drinks.  Choose heart-healthy fats.  Limit cheese to 1 oz (28 g) per day.  Eat more home-cooked food and less restaurant, buffet, and fast food.  Limit fried foods.  Cook foods using methods other than frying.  Limit canned vegetables. If you do use them, rinse them well to decrease the sodium.  When eating at a restaurant, ask that your food be prepared with less salt, or no salt if possible. WHAT FOODS CAN I EAT? Seek help from a dietitian for individual calorie needs. Grains Whole grain or whole wheat bread. Brown rice. Whole grain or whole wheat pasta. Quinoa, bulgur, and whole grain cereals. Low-sodium cereals. Corn or whole wheat flour tortillas. Whole grain cornbread. Whole grain crackers. Low-sodium crackers. Vegetables Fresh or frozen vegetables (raw, steamed, roasted, or grilled). Low-sodium or reduced-sodium tomato and vegetable juices. Low-sodium or reduced-sodium tomato sauce and paste. Low-sodium  or reduced-sodium canned vegetables.  Fruits All fresh, canned (in natural juice), or frozen fruits. Meat and Other Protein Products Ground beef (85% or leaner), grass-fed beef, or beef trimmed of fat. Skinless chicken or turkey. Ground chicken or turkey. Pork trimmed of fat. All fish and seafood. Eggs. Dried beans, peas, or lentils. Unsalted nuts and seeds. Unsalted canned beans. Dairy Low-fat dairy products, such as skim or 1% milk, 2% or reduced-fat cheeses, low-fat ricotta or cottage cheese, or plain low-fat yogurt. Low-sodium or reduced-sodium cheeses. Fats and Oils Tub margarines without trans fats. Light or reduced-fat mayonnaise and salad dressings (reduced sodium). Avocado. Safflower, olive, or canola oils. Natural peanut or almond butter. Other Unsalted popcorn and pretzels. The items listed above may not be a complete list of recommended foods or beverages. Contact your dietitian for more options. WHAT FOODS ARE NOT RECOMMENDED? Grains White bread. White pasta. White rice. Refined cornbread. Bagels and croissants. Crackers that contain trans fat. Vegetables Creamed or fried vegetables. Vegetables in a cheese sauce. Regular canned vegetables. Regular canned tomato sauce and paste. Regular tomato and vegetable juices. Fruits Dried fruits. Canned fruit in light or heavy syrup. Fruit juice. Meat and Other Protein Products Fatty cuts of meat. Ribs, chicken wings, bacon, sausage, bologna, salami, chitterlings, fatback, hot   dogs, bratwurst, and packaged luncheon meats. Salted nuts and seeds. Canned beans with salt. Dairy Whole or 2% milk, cream, half-and-half, and cream cheese. Whole-fat or sweetened yogurt. Full-fat cheeses or blue cheese. Nondairy creamers and whipped toppings. Processed cheese, cheese spreads, or cheese curds. Condiments Onion and garlic salt, seasoned salt, table salt, and sea salt. Canned and packaged gravies. Worcestershire sauce. Tartar sauce. Barbecue sauce.  Teriyaki sauce. Soy sauce, including reduced sodium. Steak sauce. Fish sauce. Oyster sauce. Cocktail sauce. Horseradish. Ketchup and mustard. Meat flavorings and tenderizers. Bouillon cubes. Hot sauce. Tabasco sauce. Marinades. Taco seasonings. Relishes. Fats and Oils Butter, stick margarine, lard, shortening, ghee, and bacon fat. Coconut, palm kernel, or palm oils. Regular salad dressings. Other Pickles and olives. Salted popcorn and pretzels. The items listed above may not be a complete list of foods and beverages to avoid. Contact your dietitian for more information. WHERE CAN I FIND MORE INFORMATION? National Heart, Lung, and Blood Institute: www.nhlbi.nih.gov/health/health-topics/topics/dash/ Document Released: 10/03/2011 Document Revised: 02/28/2014 Document Reviewed: 08/18/2013 ExitCare Patient Information 2015 [employer]. This information is not intended to replace advice given to you by your health care provider. Make sure you discuss any questions you have with your health care provider.  

## 2015-01-13 NOTE — Progress Notes (Signed)
Patient here for follow up on his HTN and for medication refills Patient complains of having a sore under his left knee that he is having Trouble healing

## 2015-01-13 NOTE — Progress Notes (Signed)
MRN: [id number] Name: Henry Walker  Sex: male Age: 54 y.o. DOB: February 11, 1961  Allergies: Review of patient's allergies indicates no known allergies.  Chief Complaint  Patient presents with  . Follow-up    HPI: Patient is 54 y.o. male who has history of hypertension, hyperlipidemia, GERD, patient comes today for followup and requesting refill on his medications, he also reported to have small bump in his left leg for the last few weeks initially noticed some discharge denies any currently, denies any fever chills chest and shortness of breath, he also has history of impaired fasting glucose, today his hemoglobin A1c noted to be 6.6%, now patient has diabetes, I have advised patient for diabetes meal planning, he does report family history of diabetes as well, initially his blood pressure was elevated, repeat manual blood pressure is 130/70.  Past Medical History  Diagnosis Date  . Hypertension   . Diabetes mellitus without complication   . Hyperlipidemia   . GERD (gastroesophageal reflux disease)   . Hx of adenomatous colonic polyps 10/06/2014    Past Surgical History  Procedure Laterality Date  . Back surgery        Medication List       This list is accurate as of: 01/13/15  3:40 PM.  Always use your most recent med list.               amLODipine 10 MG tablet  Commonly known as:  NORVASC  Take 1 tablet (10 mg total) by mouth daily.     atorvastatin 10 MG tablet  Commonly known as:  LIPITOR  Take 1 tablet (10 mg total) by mouth daily.     cephALEXin 500 MG capsule  Commonly known as:  KEFLEX  Take 1 capsule (500 mg total) by mouth 3 (three) times daily.     hydrALAZINE 25 MG tablet  Commonly known as:  APRESOLINE  Take 1 tablet (25 mg total) by mouth 3 (three) times daily.     hydrochlorothiazide 12.5 MG capsule  Commonly known as:  MICROZIDE  Take 1 capsule (12.5 mg total) by mouth daily.     metFORMIN 500 MG tablet  Commonly known as:   GLUCOPHAGE  Take 1 tablet (500 mg total) by mouth daily with breakfast.     metoprolol 50 MG tablet  Commonly known as:  LOPRESSOR  Take 1 tablet (50 mg total) by mouth 2 (two) times daily.     omeprazole 20 MG capsule  Commonly known as:  PRILOSEC  Take 1 capsule (20 mg total) by mouth daily.     tadalafil 20 MG tablet  Commonly known as:  CIALIS  Take 0.5-1 tablets (10-20 mg total) by mouth every other day as needed for erectile dysfunction.        Meds ordered this encounter  Medications  . amLODipine (NORVASC) 10 MG tablet    Sig: Take 1 tablet (10 mg total) by mouth daily.    Dispense:  90 tablet    Refill:  2  . atorvastatin (LIPITOR) 10 MG tablet    Sig: Take 1 tablet (10 mg total) by mouth daily.    Dispense:  90 tablet    Refill:  2  . hydrALAZINE (APRESOLINE) 25 MG tablet    Sig: Take 1 tablet (25 mg total) by mouth 3 (three) times daily.    Dispense:  270 tablet    Refill:  1  . hydrochlorothiazide (MICROZIDE) 12.5 MG capsule    Sig: Take  1 capsule (12.5 mg total) by mouth daily.    Dispense:  90 capsule    Refill:  1  . metoprolol (LOPRESSOR) 50 MG tablet    Sig: Take 1 tablet (50 mg total) by mouth 2 (two) times daily.    Dispense:  180 tablet    Refill:  1  . omeprazole (PRILOSEC) 20 MG capsule    Sig: Take 1 capsule (20 mg total) by mouth daily.    Dispense:  30 capsule    Refill:  3  . metFORMIN (GLUCOPHAGE) 500 MG tablet    Sig: Take 1 tablet (500 mg total) by mouth daily with breakfast.    Dispense:  90 tablet    Refill:  1  . cephALEXin (KEFLEX) 500 MG capsule    Sig: Take 1 capsule (500 mg total) by mouth 3 (three) times daily.    Dispense:  21 capsule    Refill:  0    Immunization History  Administered Date(s) Administered  . Influenza,inj,Quad PF,36+ Mos 10/12/2013    Family History  Problem Relation Age of Onset  . Colon cancer Neg Hx   . Esophageal cancer Neg Hx   . Rectal cancer Neg Hx   . Stomach cancer Neg Hx     History    Substance Use Topics  . Smoking status: Current Every Day Smoker -- 0.50 packs/day    Types: Cigarettes  . Smokeless tobacco: Never Used  . Alcohol Use: No    Review of Systems   As noted in HPI  Filed Vitals:   01/13/15 1450  BP: 130/70  Pulse:   Temp:   Resp:     Physical Exam  Physical Exam  Constitutional: No distress.  Eyes: EOM are normal. Pupils are equal, round, and reactive to light.  Cardiovascular: Normal rate and regular rhythm.   Pulmonary/Chest: Breath sounds normal. No respiratory distress. He has no wheezes. He has no rales.  Musculoskeletal: He exhibits no edema.  Left leg small sore/bump no apparent discharge currently    CBC    Component Value Date/Time   WBC 11.4* 10/19/2013 1810   RBC 5.45 10/19/2013 1810   HGB 16.7 10/19/2013 1810   HCT 46.1 10/19/2013 1810   PLT 222 10/19/2013 1810   MCV 84.6 10/19/2013 1810   LYMPHSABS 1.3 10/19/2013 1810   MONOABS 0.9 10/19/2013 1810   EOSABS 0.0 10/19/2013 1810   BASOSABS 0.0 10/19/2013 1810    CMP     Component Value Date/Time   NA 141 07/21/2014 0948   K 3.7 07/21/2014 0948   CL 104 07/21/2014 0948   CO2 25 07/21/2014 0948   GLUCOSE 123* 07/21/2014 0948   BUN 10 07/21/2014 0948   CREATININE 1.27 07/21/2014 0948   CREATININE 1.53* 10/19/2013 1810   CALCIUM 9.6 07/21/2014 0948   PROT 7.2 07/21/2014 0948   ALBUMIN 4.0 07/21/2014 0948   AST 23 07/21/2014 0948   ALT 33 07/21/2014 0948   ALKPHOS 63 07/21/2014 0948   BILITOT 0.4 07/21/2014 0948   GFRNONAA 64 07/21/2014 0948   GFRNONAA 50* 10/19/2013 1810   GFRAA 74 07/21/2014 0948   GFRAA 58* 10/19/2013 1810    Lab Results  Component Value Date/Time   CHOL 152 07/21/2014 09:48 AM    No components found for: HGA1C  Lab Results  Component Value Date/Time   AST 23 07/21/2014 09:48 AM    Assessment and Plan  Essential hypertension - Plan: advised patient for DASH diet, continue with current meds  amLODipine (NORVASC) 10 MG tablet,  hydrALAZINE (APRESOLINE) 25 MG tablet, hydrochlorothiazide (MICROZIDE) 12.5 MG capsule, metoprolol (LOPRESSOR) 50 MG tablet, COMPLETE METABOLIC PANEL WITH GFR  High cholesterol - Plan:Currently patient is on  atorvastatin (LIPITOR) 10 MG tablet,we'll check Lipid panel  Gastroesophageal reflux disease with esophagitis - Plan:lifestyle modification, continue with omeprazole (PRILOSEC) 20 MG capsule  Type 2 diabetes mellitus without complication - Plan:  Results for orders placed or performed in visit on 01/13/15  HgB A1c  Result Value Ref Range   Hemoglobin A1C 6.60    Now patient has diabetes, I have advised patient for diabetes meal planning, started on metformin, rechecked once in 3 months. COMPLETE METABOLIC PANEL WITH GFR, metFORMIN (GLUCOPHAGE) 622 MG tablet  Folliculitis - Plan: cephALEXin (KEFLEX) 500 MG capsule    Return in about 3 months (around 04/15/2015) for hypertension, diabetes, hyperipidemia.   This note has been created with Surveyor, quantity. Any transcriptional errors are unintentional.    Lorayne Marek, MD

## 2015-01-14 LAB — COMPLETE METABOLIC PANEL WITH GFR
ALT: 31 U/L (ref 0–53)
AST: 21 U/L (ref 0–37)
Albumin: 4.2 g/dL (ref 3.5–5.2)
Alkaline Phosphatase: 65 U/L (ref 39–117)
BUN: 11 mg/dL (ref 6–23)
CALCIUM: 9.9 mg/dL (ref 8.4–10.5)
CO2: 24 mEq/L (ref 19–32)
CREATININE: 1.18 mg/dL (ref 0.50–1.35)
Chloride: 107 mEq/L (ref 96–112)
GFR, Est African American: 80 mL/min
GFR, Est Non African American: 70 mL/min
GLUCOSE: 111 mg/dL — AB (ref 70–99)
Potassium: 4.6 mEq/L (ref 3.5–5.3)
SODIUM: 141 meq/L (ref 135–145)
Total Bilirubin: 0.4 mg/dL (ref 0.2–1.2)
Total Protein: 7.1 g/dL (ref 6.0–8.3)

## 2015-01-14 LAB — LIPID PANEL
CHOLESTEROL: 173 mg/dL (ref 0–200)
HDL: 28 mg/dL — ABNORMAL LOW (ref >=40)
LDL CALC: 121 mg/dL — AB (ref 0–99)
Total CHOL/HDL Ratio: 6.2 Ratio
Triglycerides: 118 mg/dL (ref <150)
VLDL: 24 mg/dL (ref 0–40)

## 2015-01-17 ENCOUNTER — Telehealth: Payer: Self-pay

## 2015-01-17 NOTE — Telephone Encounter (Signed)
Left message with family member to have the patient return our call

## 2015-01-17 NOTE — Telephone Encounter (Signed)
-----   Message from Lorayne Marek, MD sent at 01/16/2015  9:23 AM EDT ----- Call and let the patient know that his cholesterol panel shows borderline elevated LDL( bad cholesterol) advised patient to continue with Lipitor and low-fat diet.

## 2015-04-07 ENCOUNTER — Telehealth: Payer: Self-pay | Admitting: Internal Medicine

## 2015-04-07 DIAGNOSIS — E7800 Pure hypercholesterolemia, unspecified: Secondary | ICD-10-CM

## 2015-04-07 DIAGNOSIS — I10 Essential (primary) hypertension: Secondary | ICD-10-CM

## 2015-04-07 NOTE — Telephone Encounter (Signed)
Patient has come in today to see if he can receive his medication refills for Atorvastatin and Amlodipine; please f/u with patient about this request

## 2015-04-13 ENCOUNTER — Ambulatory Visit: Payer: Medicare Other | Attending: Internal Medicine | Admitting: Internal Medicine

## 2015-04-13 ENCOUNTER — Encounter: Payer: Self-pay | Admitting: Internal Medicine

## 2015-04-13 VITALS — BP 137/88 | HR 80 | Temp 98.8°F | Resp 16 | Wt 185.0 lb

## 2015-04-13 DIAGNOSIS — H6122 Impacted cerumen, left ear: Secondary | ICD-10-CM | POA: Diagnosis not present

## 2015-04-13 DIAGNOSIS — I10 Essential (primary) hypertension: Secondary | ICD-10-CM | POA: Diagnosis not present

## 2015-04-13 DIAGNOSIS — Z86010 Personal history of colonic polyps: Secondary | ICD-10-CM | POA: Insufficient documentation

## 2015-04-13 DIAGNOSIS — K219 Gastro-esophageal reflux disease without esophagitis: Secondary | ICD-10-CM | POA: Diagnosis not present

## 2015-04-13 DIAGNOSIS — E785 Hyperlipidemia, unspecified: Secondary | ICD-10-CM | POA: Diagnosis not present

## 2015-04-13 DIAGNOSIS — R05 Cough: Secondary | ICD-10-CM

## 2015-04-13 DIAGNOSIS — J069 Acute upper respiratory infection, unspecified: Secondary | ICD-10-CM | POA: Insufficient documentation

## 2015-04-13 DIAGNOSIS — E119 Type 2 diabetes mellitus without complications: Secondary | ICD-10-CM | POA: Insufficient documentation

## 2015-04-13 DIAGNOSIS — F17210 Nicotine dependence, cigarettes, uncomplicated: Secondary | ICD-10-CM | POA: Diagnosis not present

## 2015-04-13 DIAGNOSIS — Z79899 Other long term (current) drug therapy: Secondary | ICD-10-CM | POA: Insufficient documentation

## 2015-04-13 DIAGNOSIS — E7800 Pure hypercholesterolemia, unspecified: Secondary | ICD-10-CM

## 2015-04-13 DIAGNOSIS — E139 Other specified diabetes mellitus without complications: Secondary | ICD-10-CM | POA: Diagnosis not present

## 2015-04-13 DIAGNOSIS — F17200 Nicotine dependence, unspecified, uncomplicated: Secondary | ICD-10-CM

## 2015-04-13 DIAGNOSIS — Z720 Tobacco use: Secondary | ICD-10-CM

## 2015-04-13 DIAGNOSIS — R059 Cough, unspecified: Secondary | ICD-10-CM

## 2015-04-13 DIAGNOSIS — Z7951 Long term (current) use of inhaled steroids: Secondary | ICD-10-CM | POA: Insufficient documentation

## 2015-04-13 DIAGNOSIS — R0981 Nasal congestion: Secondary | ICD-10-CM

## 2015-04-13 LAB — POCT GLYCOSYLATED HEMOGLOBIN (HGB A1C): Hemoglobin A1C: 6.5

## 2015-04-13 LAB — GLUCOSE, POCT (MANUAL RESULT ENTRY): POC Glucose: 108 mg/dl — AB (ref 70–99)

## 2015-04-13 MED ORDER — BENZONATATE 100 MG PO CAPS: 100.0000 mg | ORAL_CAPSULE | Freq: Three times a day (TID) | ORAL | Status: DC | PRN

## 2015-04-13 MED ORDER — AZITHROMYCIN 250 MG PO TABS: ORAL_TABLET | ORAL | Status: DC

## 2015-04-13 MED ORDER — FLUTICASONE PROPIONATE 50 MCG/ACT NA SUSP: 2.0000 | Freq: Every day | NASAL | Status: DC

## 2015-04-13 NOTE — Progress Notes (Signed)
MRN: [id number] Name: Delontae Lamm  Sex: male Age: 54 y.o. DOB: Mar 04, 1961  Allergies: Review of patient's allergies indicates no known allergies.  Chief Complaint  Patient presents with  . Follow-up    HPI: Patient is 53 y.o. male who has history of hypertension, diabetes, hyperlipidemia comes today for followup , he has been compliant in taking his medications, today he is also complaining of stuffy nose postnasal drip coughing left ear fullness the symptoms have been ongoing for 3 weeks complaining of chills denies any fever denies any chest pain or shortness of breath, patient still smoke cigarettes but has cut down, advise patient to quit smoking.  Past Medical History  Diagnosis Date  . Hypertension   . Diabetes mellitus without complication   . Hyperlipidemia   . GERD (gastroesophageal reflux disease)   . Hx of adenomatous colonic polyps 10/06/2014    Past Surgical History  Procedure Laterality Date  . Back surgery        Medication List       This list is accurate as of: 04/13/15 12:49 PM.  Always use your most recent med list.               amLODipine 10 MG tablet  Commonly known as:  NORVASC  Take 1 tablet (10 mg total) by mouth daily.     atorvastatin 10 MG tablet  Commonly known as:  LIPITOR  Take 1 tablet (10 mg total) by mouth daily.     azithromycin 250 MG tablet  Commonly known as:  ZITHROMAX Z-PAK  Take as directed     benzonatate 100 MG capsule  Commonly known as:  TESSALON  Take 1 capsule (100 mg total) by mouth 3 (three) times daily as needed for cough.     cephALEXin 500 MG capsule  Commonly known as:  KEFLEX  Take 1 capsule (500 mg total) by mouth 3 (three) times daily.     fluticasone 50 MCG/ACT nasal spray  Commonly known as:  FLONASE  Place 2 sprays into both nostrils daily.     hydrALAZINE 25 MG tablet  Commonly known as:  APRESOLINE  Take 1 tablet (25 mg total) by mouth 3 (three) times daily.     hydrochlorothiazide 12.5 MG capsule  Commonly known as:  MICROZIDE  Take 1 capsule (12.5 mg total) by mouth daily.     metFORMIN 500 MG tablet  Commonly known as:  GLUCOPHAGE  Take 1 tablet (500 mg total) by mouth daily with breakfast.     metoprolol 50 MG tablet  Commonly known as:  LOPRESSOR  Take 1 tablet (50 mg total) by mouth 2 (two) times daily.     omeprazole 20 MG capsule  Commonly known as:  PRILOSEC  Take 1 capsule (20 mg total) by mouth daily.     tadalafil 20 MG tablet  Commonly known as:  CIALIS  Take 0.5-1 tablets (10-20 mg total) by mouth every other day as needed for erectile dysfunction.        Meds ordered this encounter  Medications  . azithromycin (ZITHROMAX Z-PAK) 250 MG tablet    Sig: Take as directed    Dispense:  6 each    Refill:  0  . benzonatate (TESSALON) 100 MG capsule    Sig: Take 1 capsule (100 mg total) by mouth 3 (three) times daily as needed for cough.    Dispense:  30 capsule    Refill:  1  . fluticasone (FLONASE) 50  MCG/ACT nasal spray    Sig: Place 2 sprays into both nostrils daily.    Dispense:  16 g    Refill:  2    Immunization History  Administered Date(s) Administered  . Influenza,inj,Quad PF,36+ Mos 10/12/2013    Family History  Problem Relation Age of Onset  . Colon cancer Neg Hx   . Esophageal cancer Neg Hx   . Rectal cancer Neg Hx   . Stomach cancer Neg Hx     History  Substance Use Topics  . Smoking status: Current Every Day Smoker -- 0.50 packs/day    Types: Cigarettes  . Smokeless tobacco: Never Used  . Alcohol Use: No    Review of Systems   As noted in HPI  Filed Vitals:   04/13/15 1155  BP: 137/88  Pulse: 80  Temp: 98.8 F (37.1 C)  Resp: 16    Physical Exam  Physical Exam  Constitutional: No distress.  HENT:  Nasal congestion, minimal sinus tenderness, increased wax in the left auditory canal  Neck: Neck supple.  Cardiovascular: Normal rate and regular rhythm.   Pulmonary/Chest:  Breath sounds normal. No respiratory distress. He has no wheezes. He has no rales.  Musculoskeletal: He exhibits no edema.    CBC    Component Value Date/Time   WBC 11.4* 10/19/2013 1810   RBC 5.45 10/19/2013 1810   HGB 16.7 10/19/2013 1810   HCT 46.1 10/19/2013 1810   PLT 222 10/19/2013 1810   MCV 84.6 10/19/2013 1810   LYMPHSABS 1.3 10/19/2013 1810   MONOABS 0.9 10/19/2013 1810   EOSABS 0.0 10/19/2013 1810   BASOSABS 0.0 10/19/2013 1810    CMP     Component Value Date/Time   NA 141 01/13/2015 1457   K 4.6 01/13/2015 1457   CL 107 01/13/2015 1457   CO2 24 01/13/2015 1457   GLUCOSE 111* 01/13/2015 1457   BUN 11 01/13/2015 1457   CREATININE 1.18 01/13/2015 1457   CREATININE 1.53* 10/19/2013 1810   CALCIUM 9.9 01/13/2015 1457   PROT 7.1 01/13/2015 1457   ALBUMIN 4.2 01/13/2015 1457   AST 21 01/13/2015 1457   ALT 31 01/13/2015 1457   ALKPHOS 65 01/13/2015 1457   BILITOT 0.4 01/13/2015 1457   GFRNONAA 70 01/13/2015 1457   GFRNONAA 50* 10/19/2013 1810   GFRAA 80 01/13/2015 1457   GFRAA 58* 10/19/2013 1810    Lab Results  Component Value Date/Time   CHOL 173 01/13/2015 02:57 PM    Lab Results  Component Value Date/Time   HGBA1C 6.50 04/13/2015 11:54 AM    Lab Results  Component Value Date/Time   AST 21 01/13/2015 02:57 PM    Assessment and Plan  Other specified diabetes mellitus without complications - Plan:  Results for orders placed or performed in visit on 04/13/15  Glucose (CBG)  Result Value Ref Range   POC Glucose 108.0 (A) 70 - 99 mg/dl  HgB A1c  Result Value Ref Range   Hemoglobin A1C 6.50    Diabetes is well controlled, continue with current meds.  Essential hypertension Blood pressure is controlled continue with current meds.  High cholesterol Currently patient is on Lipitor we'll check fasting lipid panel on the next visit  URI (upper respiratory infection) - Plan: azithromycin (ZITHROMAX Z-PAK) 250 MG tablet  Cough - Plan:  benzonatate (TESSALON) 100 MG capsule  Nasal congestion - Plan: fluticasone (FLONASE) 50 MCG/ACT nasal spray  Excess ear wax, left Prescribe Debrox eardrops  Tobacco use disorder Counseled patient to  quit smoking  Return in about 3 months (around 07/14/2015), or if symptoms worsen or fail to improve.   This note has been created with Surveyor, quantity. Any transcriptional errors are unintentional.    Lorayne Marek, MD

## 2015-04-13 NOTE — Progress Notes (Signed)
Patient states he is here for follow up on his diabetes and HTN Patient also complains of head and chest congestion,cough for almost three weeks And also having some pain to his left ear

## 2015-04-26 MED ORDER — AMLODIPINE BESYLATE 10 MG PO TABS: 10.0000 mg | ORAL_TABLET | Freq: Every day | ORAL | Status: DC

## 2015-04-26 MED ORDER — ATORVASTATIN CALCIUM 10 MG PO TABS: 10.0000 mg | ORAL_TABLET | Freq: Every day | ORAL | Status: DC

## 2015-04-26 NOTE — Telephone Encounter (Signed)
Request for medication refills have been sent to the pharmacy on file

## 2015-05-22 ENCOUNTER — Telehealth: Payer: Self-pay

## 2015-05-22 ENCOUNTER — Telehealth: Payer: Self-pay | Admitting: Internal Medicine

## 2015-05-22 DIAGNOSIS — K2100 Gastro-esophageal reflux disease with esophagitis, without bleeding: Secondary | ICD-10-CM

## 2015-05-22 MED ORDER — OMEPRAZOLE 20 MG PO CPDR: 20.0000 mg | DELAYED_RELEASE_CAPSULE | Freq: Every day | ORAL | Status: DC

## 2015-05-22 NOTE — Telephone Encounter (Signed)
Patient came into office requesting medication refill on omeprazole (PRILOSEC) 20 MG capsule. Please f/u with patient , patient states he is completely out

## 2015-05-22 NOTE — Telephone Encounter (Signed)
Refill request for omeprazole done and sent to the [facility]

## 2015-07-17 ENCOUNTER — Encounter (HOSPITAL_COMMUNITY): Payer: Self-pay | Admitting: *Deleted

## 2015-07-17 ENCOUNTER — Emergency Department (HOSPITAL_COMMUNITY)
Admission: EM | Admit: 2015-07-17 | Discharge: 2015-07-17 | Disposition: A | Discharge status: Home / Self Care | Payer: Medicare Other | Attending: Emergency Medicine | Admitting: Emergency Medicine

## 2015-07-17 ENCOUNTER — Emergency Department (HOSPITAL_COMMUNITY): Payer: Medicare Other

## 2015-07-17 DIAGNOSIS — R0600 Dyspnea, unspecified: Secondary | ICD-10-CM | POA: Diagnosis not present

## 2015-07-17 DIAGNOSIS — Z79899 Other long term (current) drug therapy: Secondary | ICD-10-CM | POA: Diagnosis not present

## 2015-07-17 DIAGNOSIS — J40 Bronchitis, not specified as acute or chronic: Secondary | ICD-10-CM | POA: Insufficient documentation

## 2015-07-17 DIAGNOSIS — Z792 Long term (current) use of antibiotics: Secondary | ICD-10-CM | POA: Insufficient documentation

## 2015-07-17 DIAGNOSIS — Z86018 Personal history of other benign neoplasm: Secondary | ICD-10-CM | POA: Diagnosis not present

## 2015-07-17 DIAGNOSIS — J029 Acute pharyngitis, unspecified: Secondary | ICD-10-CM | POA: Diagnosis present

## 2015-07-17 DIAGNOSIS — K219 Gastro-esophageal reflux disease without esophagitis: Secondary | ICD-10-CM | POA: Diagnosis not present

## 2015-07-17 DIAGNOSIS — I10 Essential (primary) hypertension: Secondary | ICD-10-CM | POA: Insufficient documentation

## 2015-07-17 DIAGNOSIS — E785 Hyperlipidemia, unspecified: Secondary | ICD-10-CM | POA: Insufficient documentation

## 2015-07-17 DIAGNOSIS — E119 Type 2 diabetes mellitus without complications: Secondary | ICD-10-CM | POA: Insufficient documentation

## 2015-07-17 DIAGNOSIS — Z720 Tobacco use: Secondary | ICD-10-CM | POA: Insufficient documentation

## 2015-07-17 DIAGNOSIS — F17210 Nicotine dependence, cigarettes, uncomplicated: Secondary | ICD-10-CM | POA: Diagnosis not present

## 2015-07-17 MED ORDER — ALBUTEROL SULFATE HFA 108 (90 BASE) MCG/ACT IN AERS
2.0000 | INHALATION_SPRAY | Freq: Once | RESPIRATORY_TRACT | Status: AC
  Administered 2015-07-17: 2 via RESPIRATORY_TRACT
  Filled 2015-07-17: qty 6.7

## 2015-07-17 MED ORDER — BENZONATATE 100 MG PO CAPS: 100.0000 mg | ORAL_CAPSULE | Freq: Three times a day (TID) | ORAL | Status: DC

## 2015-07-17 NOTE — Discharge Instructions (Signed)
Please read and follow all provided instructions.  Your diagnoses today include:  1. Bronchitis    Tests performed today include:  Chest x-ray - does not show any pneumonia  Vital signs. See below for your results today.   Medications prescribed:   Albuterol inhaler - medication that opens up your airway  Use inhaler as follows: 1-2 puffs with spacer every 4 hours as needed for wheezing, cough, or shortness of breath.    Tessalon Perles - cough suppressant medication  Take any prescribed medications only as directed.  Home care instructions:  Follow any educational materials contained in this packet.  Follow-up instructions: Please follow-up with your primary care provider in the next 3 days for further evaluation of your symptoms and a recheck if you are not feeling better.   Return instructions:   Please return to the Emergency Department if you experience worsening symptoms.  Please return with worsening wheezing, shortness of breath, or difficulty breathing.  Return with persistent fever above 101F.   Please return if you have any other emergent concerns.  Additional Information:  Your vital signs today were: BP 131/83 mmHg   Pulse 66   Temp(Src) 98 F (36.7 C) (Oral)   Resp 18   Ht 5\' 7"  (1.702 m)   Wt 185 lb (83.915 kg)   BMI 28.97 kg/m2   SpO2 95% If your blood pressure (BP) was elevated above 135/85 this visit, please have this repeated by your doctor within one month. --------------

## 2015-07-17 NOTE — ED Notes (Signed)
Pt c/o sore throat, nasal congestion, chest congestion and a dry cough x 1 week.

## 2015-07-17 NOTE — ED Provider Notes (Signed)
CSN: [id number]     Arrival date & time 07/17/15  1611 History  This chart was scribed for Henry Walker, working with Debby Freiberg, MD by Steva Colder, ED Scribe. The patient was seen in room TR06C/TR06C at 5:26 PM.    Chief Complaint  Patient presents with  . Nasal Congestion  . Sore Throat    The history is provided by the patient. No language interpreter was used.    Henry Walker is a 54 y.o. male with a medical hx of HTN, DM, who presents to the Emergency Department complaining of nasal congestion onset 1 week. He reports that he used to go to [facility] and he tried to get an appointment today but was unable to get one until next week. He notes that last time he was seen for similar symptoms he was informed that he had a respiratory infection that came from his sinuses. He states that he is having associated symptoms of intermittent sore throat, chest congestion, productive cough that is now dry, and fatigue. He states that he has tried [employer], OTC cough medications, vicks vap-o-rub, alka-seltzer, with no relief for his symptoms. He denies fever, chills, color change, rash, wound, SOB, and any other symptoms. He notes that he takes pills for his DM that he cannot remember as of now and his sugars have been fine lately.    Past Medical History  Diagnosis Date  . Hypertension   . Diabetes mellitus without complication   . Hyperlipidemia   . GERD (gastroesophageal reflux disease)   . Hx of adenomatous colonic polyps 10/06/2014   Past Surgical History  Procedure Laterality Date  . Back surgery     Family History  Problem Relation Age of Onset  . Colon cancer Neg Hx   . Esophageal cancer Neg Hx   . Rectal cancer Neg Hx   . Stomach cancer Neg Hx    Social History  Substance Use Topics  . Smoking status: Current Every Day Smoker -- 0.50 packs/day    Types: Cigarettes  . Smokeless tobacco: Never Used  . Alcohol Use: No    Review of Systems  Constitutional:  Positive for fatigue. Negative for fever and chills.  HENT: Positive for congestion and sore throat. Negative for ear pain, postnasal drip, rhinorrhea, sinus pressure and sneezing.   Eyes: Negative for redness.  Respiratory: Positive for cough (dry). Negative for shortness of breath and wheezing.   Gastrointestinal: Negative for nausea, vomiting, abdominal pain and diarrhea.  Genitourinary: Negative for dysuria.  Musculoskeletal: Negative for myalgias and neck stiffness.  Skin: Negative for color change, pallor, rash and wound.  Neurological: Negative for headaches.  Hematological: Negative for adenopathy.      Allergies  Review of patient's allergies indicates no known allergies.  Home Medications   Prior to Admission medications   Medication Sig Start Date End Date Taking? Authorizing Provider  amLODipine (NORVASC) 10 MG tablet Take 1 tablet (10 mg total) by mouth daily. 04/26/15   Lorayne Marek, MD  atorvastatin (LIPITOR) 10 MG tablet Take 1 tablet (10 mg total) by mouth daily. 04/26/15   Lorayne Marek, MD  azithromycin (ZITHROMAX Z-PAK) 250 MG tablet Take as directed 04/13/15   Lorayne Marek, MD  benzonatate (TESSALON) 100 MG capsule Take 1 capsule (100 mg total) by mouth 3 (three) times daily as needed for cough. 04/13/15   Lorayne Marek, MD  cephALEXin (KEFLEX) 500 MG capsule Take 1 capsule (500 mg total) by mouth 3 (three) times daily. 01/13/15  Lorayne Marek, MD  fluticasone (FLONASE) 50 MCG/ACT nasal spray Place 2 sprays into both nostrils daily. 04/13/15   Lorayne Marek, MD  hydrALAZINE (APRESOLINE) 25 MG tablet Take 1 tablet (25 mg total) by mouth 3 (three) times daily. 01/13/15   Lorayne Marek, MD  hydrochlorothiazide (MICROZIDE) 12.5 MG capsule Take 1 capsule (12.5 mg total) by mouth daily. 01/13/15   Lorayne Marek, MD  metFORMIN (GLUCOPHAGE) 500 MG tablet Take 1 tablet (500 mg total) by mouth daily with breakfast. 01/13/15   Lorayne Marek, MD  metoprolol (LOPRESSOR) 50 MG tablet  Take 1 tablet (50 mg total) by mouth 2 (two) times daily. 01/13/15   Lorayne Marek, MD  omeprazole (PRILOSEC) 20 MG capsule Take 1 capsule (20 mg total) by mouth daily. 05/22/15   Lorayne Marek, MD  tadalafil (CIALIS) 20 MG tablet Take 0.5-1 tablets (10-20 mg total) by mouth every other day as needed for erectile dysfunction. Patient not taking: Reported on 09/19/2014 07/21/14   Lorayne Marek, MD   BP 131/83 mmHg  Pulse 66  Temp(Src) 98 F (36.7 C) (Oral)  Resp 18  Ht 5\' 7"  (1.702 m)  Wt 185 lb (83.915 kg)  BMI 28.97 kg/m2  SpO2 95% Physical Exam  Constitutional: He appears well-developed and well-nourished.  HENT:  Head: Normocephalic and atraumatic.  Right Ear: Tympanic membrane, external ear and ear canal normal.  Left Ear: Tympanic membrane, external ear and ear canal normal.  Nose: Nose normal. No mucosal edema or rhinorrhea.  Mouth/Throat: Uvula is midline, oropharynx is clear and moist and mucous membranes are normal. Mucous membranes are not dry. No trismus in the jaw. No uvula swelling. No oropharyngeal exudate, posterior oropharyngeal edema, posterior oropharyngeal erythema or tonsillar abscesses.  Eyes: Conjunctivae are normal. Right eye exhibits no discharge. Left eye exhibits no discharge.  Neck: Normal range of motion. Neck supple.  Cardiovascular: Normal rate, regular rhythm and normal heart sounds.   Pulmonary/Chest: Effort normal and breath sounds normal. No respiratory distress. He has no wheezes. He has no rales.  Abdominal: Soft. There is no tenderness.  Neurological: He is alert.  Skin: Skin is warm and dry.  Psychiatric: He has a normal mood and affect.  Nursing note and vitals reviewed.   ED Course  Procedures (including critical care time) DIAGNOSTIC STUDIES: Oxygen Saturation is 95% on RA, adequate by my interpretation.    COORDINATION OF CARE: 5:33 PM Discussed treatment plan with pt at bedside which includes CXR and pt agreed to plan.    Labs  Review Labs Reviewed - No data to display  Imaging Review Dg Chest 2 View  07/17/2015   CLINICAL DATA:  Difficulty breathing for 3 days  EXAM: CHEST - 2 VIEW  COMPARISON:  08/13/2014  FINDINGS: Cardiac shadow is within normal limits. The lungs are well aerated bilaterally. No focal infiltrate or sizable effusion is noted.  IMPRESSION: No acute abnormality noted.   Electronically Signed   By: Inez Catalina M.D.   On: 07/17/2015 18:21   I have personally reviewed and evaluated these images as part of my medical decision-making.   EKG Interpretation None      Vital signs reviewed and are as follows: Filed Vitals:   07/17/15 1627  BP: 131/83  Pulse: 66  Temp: 98 F (36.7 C)  Resp: 18   Patient counseled on use of albuterol HFA. Instructed to use 1-2 puffs q 4 hours as needed for SOB.  Patient encouraged to return to the emergency department with fever, worsening  shortness of breath, worsening trouble breathing, or other concerns.   MDM   Final diagnoses:  Bronchitis   Well-appearing patients with history of diabetes with likely bronchitis after recent URI. No concern for PNA given normal lung exam/x-ray. Antibiotics not indicated. Will treat with albuterol inhaler and Tessalon Perles. Encourage follow-up if symptoms not improved in the next several days.    I personally performed the services described in this documentation, which was scribed in my presence. The recorded information has been reviewed and is accurate.    Carlisle Cater, PA-C 07/17/15 1844  Debby Freiberg, MD 07/17/15 [phone]

## 2015-07-17 NOTE — ED Notes (Addendum)
Pt contacted PCP but provider was unavailable. Pt C/O sore throat, congestion in head and chest x1 week. Pt denies SOB.

## 2015-07-31 ENCOUNTER — Telehealth: Payer: Self-pay | Admitting: Internal Medicine

## 2015-07-31 NOTE — Telephone Encounter (Signed)
Patient came in requesting a medication refill for metFORMIN (GLUCOPHAGE). Please follow up.

## 2015-07-31 NOTE — Telephone Encounter (Signed)
Patient would like to speak with nurse, no reason specified

## 2015-08-02 NOTE — Telephone Encounter (Signed)
Patient called requesting a med refill on metFORMIN (GLUCOPHAGE) 500 MG tablet. Please f/u with pt.

## 2015-08-04 ENCOUNTER — Ambulatory Visit: Payer: Medicare Other | Attending: Internal Medicine | Admitting: Internal Medicine

## 2015-08-04 ENCOUNTER — Encounter: Payer: Self-pay | Admitting: Internal Medicine

## 2015-08-04 VITALS — BP 142/78 | HR 64 | Temp 98.0°F | Resp 16 | Ht 67.0 in | Wt 184.0 lb

## 2015-08-04 DIAGNOSIS — Z7984 Long term (current) use of oral hypoglycemic drugs: Secondary | ICD-10-CM | POA: Insufficient documentation

## 2015-08-04 DIAGNOSIS — I10 Essential (primary) hypertension: Secondary | ICD-10-CM

## 2015-08-04 DIAGNOSIS — R0981 Nasal congestion: Secondary | ICD-10-CM

## 2015-08-04 DIAGNOSIS — E785 Hyperlipidemia, unspecified: Secondary | ICD-10-CM | POA: Diagnosis not present

## 2015-08-04 DIAGNOSIS — F17210 Nicotine dependence, cigarettes, uncomplicated: Secondary | ICD-10-CM | POA: Diagnosis not present

## 2015-08-04 DIAGNOSIS — K2100 Gastro-esophageal reflux disease with esophagitis, without bleeding: Secondary | ICD-10-CM

## 2015-08-04 DIAGNOSIS — E119 Type 2 diabetes mellitus without complications: Secondary | ICD-10-CM | POA: Diagnosis not present

## 2015-08-04 DIAGNOSIS — Z79899 Other long term (current) drug therapy: Secondary | ICD-10-CM | POA: Insufficient documentation

## 2015-08-04 LAB — GLUCOSE, POCT (MANUAL RESULT ENTRY): POC GLUCOSE: 95 mg/dL (ref 70–99)

## 2015-08-04 LAB — POCT GLYCOSYLATED HEMOGLOBIN (HGB A1C): Hemoglobin A1C: 6.2

## 2015-08-04 MED ORDER — HYDRALAZINE HCL 25 MG PO TABS: 25.0000 mg | ORAL_TABLET | Freq: Three times a day (TID) | ORAL | Status: DC

## 2015-08-04 MED ORDER — FLUTICASONE PROPIONATE 50 MCG/ACT NA SUSP: 2.0000 | Freq: Every day | NASAL | Status: DC

## 2015-08-04 MED ORDER — ACCU-CHEK SOFTCLIX LANCET DEV MISC: Status: AC

## 2015-08-04 MED ORDER — METOPROLOL TARTRATE 50 MG PO TABS
50.0000 mg | ORAL_TABLET | Freq: Two times a day (BID) | ORAL | Status: DC
Start: 2015-08-04 — End: 2016-01-05

## 2015-08-04 MED ORDER — AMLODIPINE BESYLATE 10 MG PO TABS: 10.0000 mg | ORAL_TABLET | Freq: Every day | ORAL | Status: DC

## 2015-08-04 MED ORDER — OMEPRAZOLE 20 MG PO CPDR: 20.0000 mg | DELAYED_RELEASE_CAPSULE | Freq: Every day | ORAL | Status: DC

## 2015-08-04 MED ORDER — HYDROCHLOROTHIAZIDE 12.5 MG PO CAPS: 12.5000 mg | ORAL_CAPSULE | Freq: Every day | ORAL | Status: DC

## 2015-08-04 MED ORDER — GLUCOSE BLOOD VI STRP: ORAL_STRIP | Status: DC

## 2015-08-04 MED ORDER — ATORVASTATIN CALCIUM 10 MG PO TABS: 10.0000 mg | ORAL_TABLET | Freq: Every day | ORAL | Status: DC

## 2015-08-04 MED ORDER — METFORMIN HCL 500 MG PO TABS: 500.0000 mg | ORAL_TABLET | Freq: Every day | ORAL | Status: DC

## 2015-08-04 MED ORDER — ASPIRIN EC 81 MG PO TBEC: 81.0000 mg | DELAYED_RELEASE_TABLET | Freq: Every day | ORAL | Status: DC

## 2015-08-04 NOTE — Addendum Note (Signed)
Addended by: Chari Manning A on: 08/04/2015 04:03 PM   Modules accepted: Orders

## 2015-08-04 NOTE — Progress Notes (Signed)
Patient here for follow up on his diabetes and HTN Patient also requesting  Refills on his medications

## 2015-08-04 NOTE — Progress Notes (Signed)
Patient ID: Henry Walker, male   DOB: 01-22-1961, 54 y.o.   MRN: [id number] SUBJECTIVE: 54 y.o. male for follow up of diabetes, hypertension, and tobacco use. Now smoking .5 ppd which he is down from a whole pack. Diabetic Review of Systems - medication compliance: compliant all of the time, diabetic diet compliance: compliant all of the time, home glucose monitoring: is not performed, further diabetic ROS: no polyuria or polydipsia, no chest pain, dyspnea or TIA's, no numbness, tingling or pain in extremities, no unusual visual symptoms, last eye exam approximately several years ago.  Other symptoms and concerns: He is up to date on colonoscopy   Current Outpatient Prescriptions  Medication Sig Dispense Refill  . amLODipine (NORVASC) 10 MG tablet Take 1 tablet (10 mg total) by mouth daily. 90 tablet 2  . atorvastatin (LIPITOR) 10 MG tablet Take 1 tablet (10 mg total) by mouth daily. 90 tablet 2  . fluticasone (FLONASE) 50 MCG/ACT nasal spray Place 2 sprays into both nostrils daily. 16 g 2  . hydrALAZINE (APRESOLINE) 25 MG tablet Take 1 tablet (25 mg total) by mouth 3 (three) times daily. 270 tablet 1  . hydrochlorothiazide (MICROZIDE) 12.5 MG capsule Take 1 capsule (12.5 mg total) by mouth daily. 90 capsule 1  . metFORMIN (GLUCOPHAGE) 500 MG tablet Take 1 tablet (500 mg total) by mouth daily with breakfast. 90 tablet 1  . metoprolol (LOPRESSOR) 50 MG tablet Take 1 tablet (50 mg total) by mouth 2 (two) times daily. 180 tablet 1  . omeprazole (PRILOSEC) 20 MG capsule Take 1 capsule (20 mg total) by mouth daily. 30 capsule 3  . azithromycin (ZITHROMAX Z-PAK) 250 MG tablet Take as directed (Patient not taking: Reported on 08/04/2015) 6 each 0  . benzonatate (TESSALON) 100 MG capsule Take 1 capsule (100 mg total) by mouth every 8 (eight) hours. (Patient not taking: Reported on 08/04/2015) 15 capsule 0  . cephALEXin (KEFLEX) 500 MG capsule Take 1 capsule (500 mg total) by mouth 3 (three) times  daily. (Patient not taking: Reported on 08/04/2015) 21 capsule 0  . [DISCONTINUED] tadalafil (CIALIS) 20 MG tablet Take 0.5-1 tablets (10-20 mg total) by mouth every other day as needed for erectile dysfunction. (Patient not taking: Reported on 09/19/2014) 5 tablet 3   No current facility-administered medications for this visit.    OBJECTIVE: Appearance: alert, well appearing, and in no distress, oriented to person, place, and time and normal appearing weight. BP 162/85 mmHg  Pulse 64  Temp(Src) 98 F (36.7 C)  Resp 16  Ht 5\' 7"  (1.702 m)  Wt 184 lb (83.462 kg)  BMI 28.81 kg/m2  SpO2 100%  Exam: heart sounds normal rate, regular rhythm, normal S1, S2, no murmurs, rubs, clicks or gallops, no JVD, lungs clear, no carotid bruits, feet: warm, good capillary refill, no trophic changes or ulcerative lesions, normal DP and PT pulses, normal monofilament exam and normal sensory exam/ Skin warm and dry.  Henry Walker was seen today for follow-up.  Diagnoses and all orders for this visit:  Type 2 diabetes mellitus without complication, without long-term current use of insulin (HCC) -     Glucose (CBG) -     HgB A1c -     metFORMIN (GLUCOPHAGE) 500 MG tablet; Take 1 tablet (500 mg total) by mouth daily with breakfast. -     glucose blood (ACCU-CHEK AVIVA) test strip; Use as instructed -     Lancet Devices (ACCU-CHEK SOFTCLIX) lancets; Use as instructed -  glucose blood (ACCU-CHEK AVIVA PLUS) test strip; Use as instructed -     Ambulatory referral to Ophthalmology -     Microalbumin, urine Patients diabetes is well control as evidence by consistently low a1c.  Patient will continue with current therapy and continue to make necessary lifestyle changes.  Reviewed foot care, diet, exercise, annual health maintenance with patient.   Essential hypertension -     amLODipine (NORVASC) 10 MG tablet; Take 1 tablet (10 mg total) by mouth daily. -     hydrALAZINE (APRESOLINE) 25 MG tablet; Take 1 tablet  (25 mg total) by mouth 3 (three) times daily. -     hydrochlorothiazide (MICROZIDE) 12.5 MG capsule; Take 1 capsule (12.5 mg total) by mouth daily. -     metoprolol (LOPRESSOR) 50 MG tablet; Take 1 tablet (50 mg total) by mouth 2 (two) times daily. Patient's blood pressure is slightly above goal with a systolic of 076. Patient is already on several agents. I addressed diet, exercise, smoking cessation all to help aid medication to get him at goal of <140/90.   HLD (hyperlipidemia) -     atorvastatin (LIPITOR) 10 MG tablet; Take 1 tablet (10 mg total) by mouth daily. Added aspirin 81 mg on daily for cardio protection. Education provided on proper lifestyle changes in order to lower cholesterol. Patient advised to maintain healthy weight and to keep total fat intake at 25-35% of total calories and carbohydrates 50-60% of total daily calories. Explained how high cholesterol places patient at risk for heart disease. Patient placed on appropriate medication and repeat labs in 6 months   Nasal congestion -   Refill fluticasone (FLONASE) 50 MCG/ACT nasal spray; Place 2 sprays into both nostrils daily.  Gastroesophageal reflux disease with esophagitis -    Refill omeprazole (PRILOSEC) 20 MG capsule; Take 1 capsule (20 mg total) by mouth daily. Stable med refill  Return in about 1 week (around 08/11/2015) for Lab Visit and 3 mo PCP.   Lance Bosch, NP 08/04/2015 3:46 PM

## 2015-08-05 LAB — MICROALBUMIN, URINE: MICROALB UR: 0.5 mg/dL (ref <2.0)

## 2015-09-06 ENCOUNTER — Emergency Department (HOSPITAL_COMMUNITY)
Admission: EM | Admit: 2015-09-06 | Discharge: 2015-09-06 | Disposition: A | Discharge status: Home / Self Care | Payer: Medicare Other | Attending: Physician Assistant | Admitting: Physician Assistant

## 2015-09-06 ENCOUNTER — Encounter (HOSPITAL_COMMUNITY): Payer: Self-pay | Admitting: Family Medicine

## 2015-09-06 DIAGNOSIS — M109 Gout, unspecified: Secondary | ICD-10-CM | POA: Diagnosis not present

## 2015-09-06 DIAGNOSIS — E785 Hyperlipidemia, unspecified: Secondary | ICD-10-CM | POA: Diagnosis not present

## 2015-09-06 DIAGNOSIS — M79641 Pain in right hand: Secondary | ICD-10-CM | POA: Diagnosis present

## 2015-09-06 DIAGNOSIS — K219 Gastro-esophageal reflux disease without esophagitis: Secondary | ICD-10-CM | POA: Insufficient documentation

## 2015-09-06 DIAGNOSIS — M10031 Idiopathic gout, right wrist: Secondary | ICD-10-CM | POA: Insufficient documentation

## 2015-09-06 DIAGNOSIS — Z86010 Personal history of colonic polyps: Secondary | ICD-10-CM | POA: Insufficient documentation

## 2015-09-06 DIAGNOSIS — Z7982 Long term (current) use of aspirin: Secondary | ICD-10-CM | POA: Diagnosis not present

## 2015-09-06 DIAGNOSIS — I10 Essential (primary) hypertension: Secondary | ICD-10-CM | POA: Insufficient documentation

## 2015-09-06 DIAGNOSIS — Z7951 Long term (current) use of inhaled steroids: Secondary | ICD-10-CM | POA: Insufficient documentation

## 2015-09-06 DIAGNOSIS — Z720 Tobacco use: Secondary | ICD-10-CM | POA: Insufficient documentation

## 2015-09-06 DIAGNOSIS — E119 Type 2 diabetes mellitus without complications: Secondary | ICD-10-CM | POA: Diagnosis not present

## 2015-09-06 DIAGNOSIS — Z79899 Other long term (current) drug therapy: Secondary | ICD-10-CM | POA: Diagnosis not present

## 2015-09-06 MED ORDER — OXYCODONE-ACETAMINOPHEN 5-325 MG PO TABS: 1.0000 | ORAL_TABLET | ORAL | Status: DC | PRN

## 2015-09-06 MED ORDER — INDOMETHACIN 25 MG PO CAPS: 25.0000 mg | ORAL_CAPSULE | Freq: Three times a day (TID) | ORAL | Status: DC | PRN

## 2015-09-06 NOTE — ED Provider Notes (Signed)
CSN: [id number]     Arrival date & time 09/06/15  1038 History   First MD Initiated Contact with Patient 09/06/15 1247     Chief Complaint  Patient presents with  . Hand Pain  . Arm Pain     (Consider location/radiation/quality/duration/timing/severity/associated sxs/prior Treatment) Patient is a 54 y.o. male presenting with hand pain and arm pain. The history is provided by the patient and medical records.  Hand Pain Associated symptoms include arthralgias.  Arm Pain Associated symptoms include arthralgias.   54 y.o. M with hx of HTN, DM, HLP, GERD, presenting to the ED for right wrist pain. Patient states this began yesterday afternoon been progressively worsening since that time. Pain is throbbing in nature, localized to the ulnar aspect of right wrist.  States he feels his hand has started to swelling as well.  He denies injury, trauma, or falls.  Patient denies fever, chills, hx of IVDU.  No prior right wrist injuries or surgeries.  Patient denies any significant increase of red meat, fish, or alcohol. He states he was told he had gout in his left great toe once before but is not had any recurrence of that since. He states wrist pain seems similar to his toe pain at that time, however this is worse. He tried over-the-counter Tylenol and warm compresses as well as ice without relief. Vital signs stable.  Past Medical History  Diagnosis Date  . Hypertension   . Diabetes mellitus without complication ([place])   . Hyperlipidemia   . GERD (gastroesophageal reflux disease)   . Hx of adenomatous colonic polyps 10/06/2014   Past Surgical History  Procedure Laterality Date  . Back surgery     Family History  Problem Relation Age of Onset  . Colon cancer Neg Hx   . Esophageal cancer Neg Hx   . Rectal cancer Neg Hx   . Stomach cancer Neg Hx    Social History  Substance Use Topics  . Smoking status: Current Every Day Smoker -- 0.50 packs/day    Types: Cigarettes  . Smokeless tobacco:  Never Used  . Alcohol Use: No    Review of Systems  Musculoskeletal: Positive for arthralgias.  All other systems reviewed and are negative.     Allergies  Review of patient's allergies indicates no known allergies.  Home Medications   Prior to Admission medications   Medication Sig Start Date End Date Taking? Authorizing Provider  amLODipine (NORVASC) 10 MG tablet Take 1 tablet (10 mg total) by mouth daily. 08/04/15   Lance Bosch, NP  aspirin EC 81 MG tablet Take 1 tablet (81 mg total) by mouth daily. 08/04/15   Lance Bosch, NP  atorvastatin (LIPITOR) 10 MG tablet Take 1 tablet (10 mg total) by mouth daily. 08/04/15   Lance Bosch, NP  azithromycin (ZITHROMAX Z-PAK) 250 MG tablet Take as directed 04/13/15   Lorayne Marek, MD  benzonatate (TESSALON) 100 MG capsule Take 1 capsule (100 mg total) by mouth every 8 (eight) hours. 07/17/15   Carlisle Cater, PA-C  cephALEXin (KEFLEX) 500 MG capsule Take 1 capsule (500 mg total) by mouth 3 (three) times daily. 01/13/15   Lorayne Marek, MD  fluticasone (FLONASE) 50 MCG/ACT nasal spray Place 2 sprays into both nostrils daily. 08/04/15   Lance Bosch, NP  glucose blood (ACCU-CHEK AVIVA PLUS) test strip Use as instructed 08/04/15   Lance Bosch, NP  glucose blood (ACCU-CHEK AVIVA) test strip Use as instructed 08/04/15   Jannifer Rodney  Feliciana Rossetti, NP  hydrALAZINE (APRESOLINE) 25 MG tablet Take 1 tablet (25 mg total) by mouth 3 (three) times daily. 08/04/15   Lance Bosch, NP  hydrochlorothiazide (MICROZIDE) 12.5 MG capsule Take 1 capsule (12.5 mg total) by mouth daily. 08/04/15   Lance Bosch, NP  Lancet Devices [facility]) lancets Use as instructed 08/04/15   Lance Bosch, NP  metFORMIN (GLUCOPHAGE) 500 MG tablet Take 1 tablet (500 mg total) by mouth daily with breakfast. 08/04/15   Lance Bosch, NP  metoprolol (LOPRESSOR) 50 MG tablet Take 1 tablet (50 mg total) by mouth 2 (two) times daily. 08/04/15   Lance Bosch, NP  omeprazole  (PRILOSEC) 20 MG capsule Take 1 capsule (20 mg total) by mouth daily. 08/04/15   Lance Bosch, NP   BP 154/124 mmHg  Pulse 58  Temp(Src) 97.9 F (36.6 C) (Oral)  Resp 18  SpO2 99%   Physical Exam  Constitutional: He is oriented to person, place, and time. He appears well-developed and well-nourished. No distress.  HENT:  Head: Normocephalic and atraumatic.  Mouth/Throat: Oropharynx is clear and moist.  Eyes: Conjunctivae and EOM are normal. Pupils are equal, round, and reactive to light.  Neck: Normal range of motion. Neck supple.  Cardiovascular: Normal rate, regular rhythm and normal heart sounds.   Pulmonary/Chest: Effort normal and breath sounds normal. No respiratory distress. He has no wheezes. He has no rales.  Musculoskeletal: Normal range of motion. He exhibits no edema.  Right wrist with TTP along ulnar aspect of right wrist and mild swelling noted; warmth to touch without overlying erythema or skin changes; no lymphangitis of arm; normal flexion/extension of wrist noted but with significant pain; strong radial pulse and cap refill; normal sensation throughout  Neurological: He is alert and oriented to person, place, and time.  Skin: Skin is warm and dry. He is not diaphoretic.  Psychiatric: He has a normal mood and affect.  Nursing note and vitals reviewed.   ED Course  Procedures (including critical care time) Labs Review Labs Reviewed - No data to display  Imaging Review No results found. I have personally reviewed and evaluated these images and lab results as part of my medical decision-making.   EKG Interpretation None      MDM   Final diagnoses:  Acute gout of right wrist, unspecified cause   54 year old male here with right wrist pain, onset last night. Wrist is exquisitely tender on exam along ulnar aspect. There is mild swelling and warmth to touch without overlying erythema or other skin changes. There is no lymphangitis of the right arm. He has  normal range of motion of the right wrist is significantly painful. Hand is neurovascularly intact. Do not suspect septic joint. Symptoms and physical exam findings are consistent with gout. Of note, patient is on hydrochlorothiazide which may be contributing to his gout flares. I've encouraged him to discuss this with his primary care physician.  We'll start her on indomethacin and Percocet. Patient to follow-up with his PCP within the next week.  Discussed plan with patient, he/she acknowledged understanding and agreed with plan of care.  Return precautions given for new or worsening symptoms.  Larene Pickett, PA-C 09/06/15 1456  Courteney Julio Alm, MD 09/06/15 1622

## 2015-09-06 NOTE — Discharge Instructions (Signed)
Take the prescribed medication as directed.  Use caution while taking Percocet, this medication can make you drowsy. As we discussed, your hydrochlorothiazide may be contributing to your gout flares. I encourage you to discuss this with your primary care physician. Return to the ED for new or worsening symptoms.  Low-Purine Diet Purines are compounds that affect the level of uric acid in your body. A low-purine diet is a diet that is low in purines. Eating a low-purine diet can prevent the level of uric acid in your body from getting too high and causing gout or kidney stones or both. WHAT DO I NEED TO KNOW ABOUT THIS DIET?  Choose low-purine foods. Examples of low-purine foods are listed in the next section.  Drink plenty of fluids, especially water. Fluids can help remove uric acid from your body. Try to drink 8-16 cups (1.9-3.8 L) a day.  Limit foods high in fat, especially saturated fat, as fat makes it harder for the body to get rid of uric acid. Foods high in saturated fat include pizza, cheese, ice cream, whole milk, fried foods, and gravies. Choose foods that are lower in fat and lean sources of protein. Use olive oil when cooking as it contains healthy fats that are not high in saturated fat.  Limit alcohol. Alcohol interferes with the elimination of uric acid from your body. If you are having a gout attack, avoid all alcohol.  Keep in mind that different people's bodies react differently to different foods. You will probably learn over time which foods do or do not affect you. If you discover that a food tends to cause your gout to flare up, avoid eating that food. You can more freely enjoy foods that do not cause problems. If you have any questions about a food item, talk to your dietitian or health care provider. WHICH FOODS ARE LOW, MODERATE, AND HIGH IN PURINES? The following is a list of foods that are low, moderate, and high in purines. You can eat any amount of the foods that are  low in purines. You may be able to have small amounts of foods that are moderate in purines. Ask your health care provider how much of a food moderate in purines you can have. Avoid foods high in purines. Grains  Foods low in purines: Enriched white bread, pasta, rice, cake, cornbread, popcorn.  Foods moderate in purines: Whole-grain breads and cereals, wheat germ, bran, oatmeal. Uncooked oatmeal. Dry wheat bran or wheat germ.  Foods high in purines: Pancakes, [place] toast, biscuits, muffins. Vegetables  Foods low in purines: All vegetables, except those that are moderate in purines.  Foods moderate in purines: Asparagus, cauliflower, spinach, mushrooms, green peas. Fruits  All fruits are low in purines. Meats and other Protein Foods  Foods low in purines: Eggs, nuts, peanut butter.  Foods moderate in purines: 80-90% lean beef, lamb, veal, pork, poultry, fish, eggs, peanut butter, nuts. Crab, lobster, oysters, and shrimp. Cooked dried beans, peas, and lentils.  Foods high in purines: Anchovies, sardines, herring, mussels, tuna, codfish, scallops, trout, and haddock. Berniece Salines. Organ meats (such as liver or kidney). Tripe. Game meat. Goose. Sweetbreads. Dairy  All dairy foods are low in purines. Low-fat and fat-free dairy products are best because they are low in saturated fat. Beverages  Drinks low in purines: Water, carbonated beverages, tea, coffee, cocoa.  Drinks moderate in purines: Soft drinks and other drinks sweetened with high-fructose corn syrup. Juices. To find whether a food or drink is sweetened with  high-fructose corn syrup, look at the ingredients list.  Drinks high in purines: Alcoholic beverages (such as beer). Condiments  Foods low in purines: Salt, herbs, olives, pickles, relishes, vinegar.  Foods moderate in purines: Butter, margarine, oils, mayonnaise. Fats and Oils  Foods low in purines: All types, except gravies and sauces made with meat.  Foods high in  purines: Gravies and sauces made with meat. Other Foods  Foods low in purines: Sugars, sweets, gelatin. Cake. Soups made without meat.  Foods moderate in purines: Meat-based or fish-based soups, broths, or bouillons. Foods and drinks sweetened with high-fructose corn syrup.  Foods high in purines: High-fat desserts (such as ice cream, cookies, cakes, pies, doughnuts, and chocolate). Contact your dietitian for more information on foods that are not listed here.   This information is not intended to replace advice given to you by your health care provider. Make sure you discuss any questions you have with your health care provider.   Document Released: 02/08/2011 Document Revised: 10/19/2013 Document Reviewed: 09/20/2013 Elsevier Interactive Patient Education [employer].

## 2015-09-06 NOTE — ED Notes (Signed)
Pt here for right hand pain radiating into his arm since yesterday. sts sore to the touch around wrist.

## 2015-10-02 DIAGNOSIS — H2513 Age-related nuclear cataract, bilateral: Secondary | ICD-10-CM | POA: Diagnosis not present

## 2015-10-02 DIAGNOSIS — E119 Type 2 diabetes mellitus without complications: Secondary | ICD-10-CM | POA: Diagnosis not present

## 2015-10-03 LAB — HM DIABETES EYE EXAM

## 2015-12-05 ENCOUNTER — Telehealth: Payer: Self-pay

## 2015-12-05 NOTE — Telephone Encounter (Signed)
Returned call to sean at [employer] [phone] They needed to clarify patient's last A!C Information verified

## 2016-01-05 ENCOUNTER — Ambulatory Visit: Payer: Medicare Other | Attending: Internal Medicine | Admitting: Internal Medicine

## 2016-01-05 ENCOUNTER — Encounter: Payer: Self-pay | Admitting: Internal Medicine

## 2016-01-05 VITALS — BP 146/84 | HR 72 | Temp 98.0°F | Resp 16 | Ht 67.0 in | Wt 187.0 lb

## 2016-01-05 DIAGNOSIS — E785 Hyperlipidemia, unspecified: Secondary | ICD-10-CM | POA: Diagnosis not present

## 2016-01-05 DIAGNOSIS — E119 Type 2 diabetes mellitus without complications: Secondary | ICD-10-CM | POA: Diagnosis not present

## 2016-01-05 DIAGNOSIS — Z79899 Other long term (current) drug therapy: Secondary | ICD-10-CM | POA: Insufficient documentation

## 2016-01-05 DIAGNOSIS — Z794 Long term (current) use of insulin: Secondary | ICD-10-CM

## 2016-01-05 DIAGNOSIS — Z7984 Long term (current) use of oral hypoglycemic drugs: Secondary | ICD-10-CM | POA: Diagnosis not present

## 2016-01-05 DIAGNOSIS — K2100 Gastro-esophageal reflux disease with esophagitis, without bleeding: Secondary | ICD-10-CM

## 2016-01-05 DIAGNOSIS — I10 Essential (primary) hypertension: Secondary | ICD-10-CM | POA: Diagnosis not present

## 2016-01-05 DIAGNOSIS — Z7982 Long term (current) use of aspirin: Secondary | ICD-10-CM | POA: Insufficient documentation

## 2016-01-05 LAB — GLUCOSE, POCT (MANUAL RESULT ENTRY): POC Glucose: 120 mg/dl — AB (ref 70–99)

## 2016-01-05 LAB — POCT GLYCOSYLATED HEMOGLOBIN (HGB A1C): Hemoglobin A1C: 6.1

## 2016-01-05 MED ORDER — AMLODIPINE BESYLATE 10 MG PO TABS: 10.0000 mg | ORAL_TABLET | Freq: Every day | ORAL | Status: DC

## 2016-01-05 MED ORDER — METFORMIN HCL ER 500 MG PO TB24: 500.0000 mg | ORAL_TABLET | Freq: Every day | ORAL | Status: DC

## 2016-01-05 MED ORDER — HYDRALAZINE HCL 25 MG PO TABS: 25.0000 mg | ORAL_TABLET | Freq: Three times a day (TID) | ORAL | Status: DC

## 2016-01-05 MED ORDER — METOPROLOL TARTRATE 50 MG PO TABS: 50.0000 mg | ORAL_TABLET | Freq: Two times a day (BID) | ORAL | Status: DC

## 2016-01-05 MED ORDER — ATORVASTATIN CALCIUM 10 MG PO TABS: 10.0000 mg | ORAL_TABLET | Freq: Every day | ORAL | Status: DC

## 2016-01-05 MED ORDER — OMEPRAZOLE 20 MG PO CPDR: 20.0000 mg | DELAYED_RELEASE_CAPSULE | Freq: Every day | ORAL | Status: DC

## 2016-01-05 NOTE — Progress Notes (Signed)
Patient here for follow up on his diabetes and HTN Patient also in need of medication refills

## 2016-01-05 NOTE — Progress Notes (Signed)
Patient ID: Henry Walker, male   DOB: 26-Jul-1961, 55 y.o.   MRN: [id number] SUBJECTIVE: 55 y.o. male for follow up of diabetes. Diabetic Review of Systems - medication compliance: compliant all of the time, diabetic diet compliance: compliant all of the time, further diabetic ROS: no polyuria or polydipsia, no chest pain, dyspnea or TIA's, no numbness, tingling or pain in extremities, no unusual visual symptoms, no hypoglycemia, last eye exam approximately 3 months ago, was told he has a cataract but no retinopathy.  Other symptoms and concerns: Reports that he had pneumonia shot last year at his local pharmacy.   Current Outpatient Prescriptions  Medication Sig Dispense Refill  . amLODipine (NORVASC) 10 MG tablet Take 1 tablet (10 mg total) by mouth daily. 90 tablet 1  . atorvastatin (LIPITOR) 10 MG tablet Take 1 tablet (10 mg total) by mouth daily. 90 tablet 2  . hydrALAZINE (APRESOLINE) 25 MG tablet Take 1 tablet (25 mg total) by mouth 3 (three) times daily. 270 tablet 1  . metoprolol (LOPRESSOR) 50 MG tablet Take 1 tablet (50 mg total) by mouth 2 (two) times daily. 180 tablet 1  . omeprazole (PRILOSEC) 20 MG capsule Take 1 capsule (20 mg total) by mouth daily. 30 capsule 3  . aspirin EC 81 MG tablet Take 1 tablet (81 mg total) by mouth daily. 30 tablet 11  . azithromycin (ZITHROMAX Z-PAK) 250 MG tablet Take as directed 6 each 0  . benzonatate (TESSALON) 100 MG capsule Take 1 capsule (100 mg total) by mouth every 8 (eight) hours. 15 capsule 0  . cephALEXin (KEFLEX) 500 MG capsule Take 1 capsule (500 mg total) by mouth 3 (three) times daily. 21 capsule 0  . fluticasone (FLONASE) 50 MCG/ACT nasal spray Place 2 sprays into both nostrils daily. 16 g 2  . glucose blood (ACCU-CHEK AVIVA PLUS) test strip Use as instructed 100 each 12  . glucose blood (ACCU-CHEK AVIVA) test strip Use as instructed 100 each 12  . hydrochlorothiazide (MICROZIDE) 12.5 MG capsule Take 1 capsule (12.5 mg total) by  mouth daily. 90 capsule 1  . indomethacin (INDOCIN) 25 MG capsule Take 1 capsule (25 mg total) by mouth 3 (three) times daily as needed. 30 capsule 0  . Lancet Devices (ACCU-CHEK SOFTCLIX) lancets Use as instructed 1 each 0  . oxyCODONE-acetaminophen (PERCOCET/ROXICET) 5-325 MG tablet Take 1 tablet by mouth every 4 (four) hours as needed. 15 tablet 0  . [DISCONTINUED] tadalafil (CIALIS) 20 MG tablet Take 0.5-1 tablets (10-20 mg total) by mouth every other day as needed for erectile dysfunction. (Patient not taking: Reported on 09/19/2014) 5 tablet 3   No current facility-administered medications for this visit.  ROS: Other than what is stated in HPI, all other systems are negative.   OBJECTIVE: Appearance: alert, well appearing, and in no distress, oriented to person, place, and time and normal appearing weight. BP 146/84 mmHg  Pulse 72  Temp(Src) 98 F (36.7 C)  Resp 16  Ht 5\' 7"  (1.702 m)  Wt 187 lb (84.823 kg)  BMI 29.28 kg/m2  SpO2 100%  Exam: heart sounds normal rate, regular rhythm, normal S1, S2, no murmurs, rubs, clicks or gallops, no JVD, chest clear, no carotid bruits, feet: warm, good capillary refill, no trophic changes or ulcerative lesions, normal DP and PT pulses, normal monofilament exam and normal sensory exam  ASSESSMENT: Henry Walker was seen today for follow-up.  Diagnoses and all orders for this visit:  Type 2 diabetes mellitus without complication, with long-term  current use of insulin (HCC) -     Glucose (CBG) -     HgB A1c -     metFORMIN (GLUCOPHAGE XR) 500 MG 24 hr tablet; Take 1 tablet (500 mg total) by mouth daily with breakfast. Switched patient to extended release metformin to help with GI upset. Patients diabetes is well control as evidence by consistently low a1c.  Patient will continue with current therapy and continue to make necessary lifestyle changes.  Reviewed foot care, diet, exercise, annual health maintenance with patient.   Essential  hypertension -     amLODipine (NORVASC) 10 MG tablet; Take 1 tablet (10 mg total) by mouth daily. -     hydrALAZINE (APRESOLINE) 25 MG tablet; Take 1 tablet (25 mg total) by mouth 3 (three) times daily. -     metoprolol (LOPRESSOR) 50 MG tablet; Take 1 tablet (50 mg total) by mouth 2 (two) times daily. -     COMPLETE METABOLIC PANEL WITH GFR -     CBC with Differential Patients BP is elevated today but it is time for his second dose of Hydralazine. I have stressed that he needs to get a schedule for medications and stick with it so that his pressures are constantly controlled.  Dash diet advised.  HLD (hyperlipidemia) -     atorvastatin (LIPITOR) 10 MG tablet; Take 1 tablet (10 mg total) by mouth daily. -     Lipid panel Education provided on proper lifestyle changes in order to lower cholesterol. Patient advised to maintain healthy weight and to keep total fat intake at 25-35% of total calories and carbohydrates 50-60% of total daily calories. Explained how high cholesterol places patient at risk for heart disease. Patient placed on appropriate medication and repeat labs in 6 months   Gastroesophageal reflux disease with esophagitis -     omeprazole (PRILOSEC) 20 MG capsule; Take 1 capsule (20 mg total) by mouth daily. Discussed diet and weight with patient relating to acid reflux.  Went over things that may exacerbate acid reflux such as tomatoes, spicy foods, coffee, carbonated beverages, chocolates, etc.  Advised patient to avoid laying down at least two hours after meals and sleep with HOB elevated.     PLAN: See orders for this visit as documented in the electronic medical record. Issues reviewed with him: diabetic diet discussed in detail, written exchange diet given, low cholesterol diet, weight control and daily exercise discussed, foot care discussed and Podiatry visits discussed and long term diabetic complications discussed.  Return in about 3 months (around 04/06/2016) for  DM/HTN.  Henry Bosch, NP 01/05/2016 3:03 PM

## 2016-01-06 LAB — COMPLETE METABOLIC PANEL WITH GFR
ALT: 33 U/L (ref 9–46)
AST: 26 U/L (ref 10–35)
Albumin: 4.2 g/dL (ref 3.6–5.1)
Alkaline Phosphatase: 60 U/L (ref 40–115)
BUN: 10 mg/dL (ref 7–25)
CALCIUM: 9.2 mg/dL (ref 8.6–10.3)
CHLORIDE: 104 mmol/L (ref 98–110)
CO2: 22 mmol/L (ref 20–31)
CREATININE: 1.28 mg/dL (ref 0.70–1.33)
GFR, Est African American: 72 mL/min (ref >=60)
GFR, Est Non African American: 63 mL/min (ref >=60)
Glucose, Bld: 114 mg/dL — ABNORMAL HIGH (ref 65–99)
Potassium: 4 mmol/L (ref 3.5–5.3)
SODIUM: 139 mmol/L (ref 135–146)
Total Bilirubin: 0.6 mg/dL (ref 0.2–1.2)
Total Protein: 7.2 g/dL (ref 6.1–8.1)

## 2016-01-06 LAB — LIPID PANEL
CHOL/HDL RATIO: 5.9 ratio — AB (ref <=5.0)
Cholesterol: 159 mg/dL (ref 125–200)
HDL: 27 mg/dL — AB (ref >=40)
LDL Cholesterol: 102 mg/dL (ref <130)
Triglycerides: 149 mg/dL (ref <150)
VLDL: 30 mg/dL (ref <30)

## 2016-01-23 ENCOUNTER — Emergency Department (HOSPITAL_COMMUNITY): Payer: Medicare Other

## 2016-01-23 ENCOUNTER — Emergency Department (HOSPITAL_COMMUNITY)
Admission: EM | Admit: 2016-01-23 | Discharge: 2016-01-23 | Disposition: A | Discharge status: Home / Self Care | Payer: Medicare Other | Attending: Emergency Medicine | Admitting: Emergency Medicine

## 2016-01-23 ENCOUNTER — Encounter (HOSPITAL_COMMUNITY): Payer: Self-pay

## 2016-01-23 DIAGNOSIS — Z86010 Personal history of colonic polyps: Secondary | ICD-10-CM | POA: Diagnosis not present

## 2016-01-23 DIAGNOSIS — E785 Hyperlipidemia, unspecified: Secondary | ICD-10-CM | POA: Diagnosis not present

## 2016-01-23 DIAGNOSIS — Y9389 Activity, other specified: Secondary | ICD-10-CM | POA: Diagnosis not present

## 2016-01-23 DIAGNOSIS — K219 Gastro-esophageal reflux disease without esophagitis: Secondary | ICD-10-CM | POA: Insufficient documentation

## 2016-01-23 DIAGNOSIS — Y92410 Unspecified street and highway as the place of occurrence of the external cause: Secondary | ICD-10-CM | POA: Insufficient documentation

## 2016-01-23 DIAGNOSIS — Z79899 Other long term (current) drug therapy: Secondary | ICD-10-CM | POA: Insufficient documentation

## 2016-01-23 DIAGNOSIS — Z792 Long term (current) use of antibiotics: Secondary | ICD-10-CM | POA: Diagnosis not present

## 2016-01-23 DIAGNOSIS — S79922A Unspecified injury of left thigh, initial encounter: Secondary | ICD-10-CM | POA: Insufficient documentation

## 2016-01-23 DIAGNOSIS — S79921A Unspecified injury of right thigh, initial encounter: Secondary | ICD-10-CM | POA: Diagnosis not present

## 2016-01-23 DIAGNOSIS — Z7952 Long term (current) use of systemic steroids: Secondary | ICD-10-CM | POA: Diagnosis not present

## 2016-01-23 DIAGNOSIS — Y998 Other external cause status: Secondary | ICD-10-CM | POA: Insufficient documentation

## 2016-01-23 DIAGNOSIS — Z7984 Long term (current) use of oral hypoglycemic drugs: Secondary | ICD-10-CM | POA: Insufficient documentation

## 2016-01-23 DIAGNOSIS — M545 Low back pain: Secondary | ICD-10-CM | POA: Diagnosis not present

## 2016-01-23 DIAGNOSIS — I10 Essential (primary) hypertension: Secondary | ICD-10-CM | POA: Diagnosis not present

## 2016-01-23 DIAGNOSIS — E119 Type 2 diabetes mellitus without complications: Secondary | ICD-10-CM | POA: Insufficient documentation

## 2016-01-23 DIAGNOSIS — Z7982 Long term (current) use of aspirin: Secondary | ICD-10-CM | POA: Insufficient documentation

## 2016-01-23 DIAGNOSIS — F17210 Nicotine dependence, cigarettes, uncomplicated: Secondary | ICD-10-CM | POA: Diagnosis not present

## 2016-01-23 DIAGNOSIS — S3992XA Unspecified injury of lower back, initial encounter: Secondary | ICD-10-CM | POA: Diagnosis not present

## 2016-01-23 MED ORDER — CYCLOBENZAPRINE HCL 5 MG PO TABS: 5.0000 mg | ORAL_TABLET | Freq: Three times a day (TID) | ORAL | Status: DC | PRN

## 2016-01-23 MED ORDER — KETOROLAC TROMETHAMINE 30 MG/ML IJ SOLN
30.0000 mg | Freq: Once | INTRAMUSCULAR | Status: AC
  Administered 2016-01-23: 30 mg via INTRAMUSCULAR

## 2016-01-23 MED ORDER — DEXAMETHASONE SODIUM PHOSPHATE 10 MG/ML IJ SOLN
10.0000 mg | Freq: Once | INTRAMUSCULAR | Status: AC
  Administered 2016-01-23: 10 mg via INTRAMUSCULAR
  Filled 2016-01-23: qty 1

## 2016-01-23 MED ORDER — KETOROLAC TROMETHAMINE 30 MG/ML IJ SOLN
30.0000 mg | Freq: Once | INTRAMUSCULAR | Status: DC
  Filled 2016-01-23: qty 1

## 2016-01-23 MED ORDER — NAPROXEN 500 MG PO TABS: 500.0000 mg | ORAL_TABLET | Freq: Two times a day (BID) | ORAL | Status: DC
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
## 2016-01-24 ENCOUNTER — Telehealth: Payer: Self-pay

## 2016-01-24 DIAGNOSIS — E7800 Pure hypercholesterolemia, unspecified: Secondary | ICD-10-CM

## 2016-01-24 MED ORDER — ATORVASTATIN CALCIUM 20 MG PO TABS: 20.0000 mg | ORAL_TABLET | Freq: Every day | ORAL | Status: DC

## 2016-01-24 NOTE — Telephone Encounter (Signed)
-----   Message from Lance Bosch, NP sent at 01/22/2016  9:11 PM EDT ----- Patients cholesterol is still not at goal. Please increase his Atorvastatin to 20 mg. Please send updated prescription. Please go over things that increase cholesterol levels such as breads pasta, rice, butters, fried foods, etc.

## 2016-01-24 NOTE — Telephone Encounter (Signed)
Tried to contact patient  Patient not available Message left on voice mail to return our call New rx sent to pharmacy on file

## 2016-05-30 ENCOUNTER — Telehealth: Payer: Self-pay | Admitting: Internal Medicine

## 2016-05-30 DIAGNOSIS — K2100 Gastro-esophageal reflux disease with esophagitis, without bleeding: Secondary | ICD-10-CM

## 2016-05-30 MED ORDER — OMEPRAZOLE 20 MG PO CPDR: 20.0000 mg | DELAYED_RELEASE_CAPSULE | Freq: Every day | ORAL | 0 refills | Status: DC

## 2016-05-30 NOTE — Telephone Encounter (Signed)
Patient is needing a refill for omeprazole. Patient has appt Monday. Please follow up.

## 2016-05-30 NOTE — Telephone Encounter (Signed)
Omeprazole refilled.

## 2016-06-03 ENCOUNTER — Ambulatory Visit: Payer: Medicare Other | Attending: Internal Medicine | Admitting: Internal Medicine

## 2016-06-03 ENCOUNTER — Encounter: Payer: Self-pay | Admitting: Internal Medicine

## 2016-06-03 VITALS — BP 159/89 | HR 52 | Temp 98.5°F | Resp 16 | Wt 182.0 lb

## 2016-06-03 DIAGNOSIS — E119 Type 2 diabetes mellitus without complications: Secondary | ICD-10-CM | POA: Diagnosis not present

## 2016-06-03 DIAGNOSIS — H6122 Impacted cerumen, left ear: Secondary | ICD-10-CM

## 2016-06-03 DIAGNOSIS — Z794 Long term (current) use of insulin: Secondary | ICD-10-CM

## 2016-06-03 DIAGNOSIS — I10 Essential (primary) hypertension: Secondary | ICD-10-CM

## 2016-06-03 DIAGNOSIS — Z23 Encounter for immunization: Secondary | ICD-10-CM

## 2016-06-03 DIAGNOSIS — Z791 Long term (current) use of non-steroidal anti-inflammatories (NSAID): Secondary | ICD-10-CM | POA: Diagnosis not present

## 2016-06-03 DIAGNOSIS — R7301 Impaired fasting glucose: Secondary | ICD-10-CM | POA: Diagnosis not present

## 2016-06-03 DIAGNOSIS — F17210 Nicotine dependence, cigarettes, uncomplicated: Secondary | ICD-10-CM | POA: Diagnosis not present

## 2016-06-03 DIAGNOSIS — Z800 Family history of malignant neoplasm of digestive organs: Secondary | ICD-10-CM | POA: Diagnosis not present

## 2016-06-03 DIAGNOSIS — Z125 Encounter for screening for malignant neoplasm of prostate: Secondary | ICD-10-CM

## 2016-06-03 DIAGNOSIS — R001 Bradycardia, unspecified: Secondary | ICD-10-CM | POA: Diagnosis not present

## 2016-06-03 DIAGNOSIS — Z1159 Encounter for screening for other viral diseases: Secondary | ICD-10-CM | POA: Diagnosis not present

## 2016-06-03 DIAGNOSIS — Z79899 Other long term (current) drug therapy: Secondary | ICD-10-CM | POA: Insufficient documentation

## 2016-06-03 DIAGNOSIS — Z7982 Long term (current) use of aspirin: Secondary | ICD-10-CM | POA: Insufficient documentation

## 2016-06-03 DIAGNOSIS — K219 Gastro-esophageal reflux disease without esophagitis: Secondary | ICD-10-CM | POA: Insufficient documentation

## 2016-06-03 DIAGNOSIS — E7800 Pure hypercholesterolemia, unspecified: Secondary | ICD-10-CM | POA: Diagnosis not present

## 2016-06-03 LAB — POCT GLYCOSYLATED HEMOGLOBIN (HGB A1C): Hemoglobin A1C: 6.2

## 2016-06-03 MED ORDER — LISINOPRIL-HYDROCHLOROTHIAZIDE 10-12.5 MG PO TABS: 1.0000 | ORAL_TABLET | Freq: Every day | ORAL | 3 refills | Status: DC

## 2016-06-03 MED ORDER — CARBAMIDE PEROXIDE 6.5 % OT SOLN: 5.0000 [drp] | Freq: Once | OTIC | Status: DC

## 2016-06-03 MED ORDER — FAMOTIDINE 20 MG PO TABS: 20.0000 mg | ORAL_TABLET | Freq: Two times a day (BID) | ORAL | 1 refills | Status: DC

## 2016-06-03 MED ORDER — HYDRALAZINE HCL 25 MG PO TABS: 25.0000 mg | ORAL_TABLET | Freq: Three times a day (TID) | ORAL | 2 refills | Status: DC

## 2016-06-03 MED ORDER — METFORMIN HCL ER 500 MG PO TB24: 500.0000 mg | ORAL_TABLET | Freq: Every day | ORAL | 3 refills | Status: DC

## 2016-06-03 MED ORDER — ATORVASTATIN CALCIUM 20 MG PO TABS: 20.0000 mg | ORAL_TABLET | Freq: Every day | ORAL | 3 refills | Status: DC

## 2016-06-03 MED ORDER — AMLODIPINE BESYLATE 10 MG PO TABS: 10.0000 mg | ORAL_TABLET | Freq: Every day | ORAL | 3 refills | Status: DC

## 2016-06-03 NOTE — Progress Notes (Signed)
Henry Walker, is a 55 y.o. male  [id number]  [id number]  DOB - [birth year]  CC:  Chief Complaint  Patient presents with  . Establish Care       HPI: Henry Walker is a 55 y.o. male here today to establish medical care, for f/u of htn, gerd, hld, and dm. Overall doing well, trying to watch his diet some.  Smokes 1/2 ppd, not ready to stop.  C/o of mild problems w/ ED.  Heart burn bad, has been on ppi for years, but does not notice much difference.  Currently does not eat a lot of chocolate or tomatoe sauce, but does smoke, etc.  Amendable to tdap today on recommendation.   Patient has No headache, No chest pain, No abdominal pain - No Nausea, No new weakness tingling or numbness, No Cough - SOB.    Review of Systems: Per HPI, o/w all systems reviewed and negative.   No Known Allergies Past Medical History:  Diagnosis Date  . Diabetes mellitus without complication ([place])   . GERD (gastroesophageal reflux disease)   . Hx of adenomatous colonic polyps 10/06/2014  . Hyperlipidemia   . Hypertension    Current Outpatient Prescriptions on File Prior to Visit  Medication Sig Dispense Refill  . aspirin EC 81 MG tablet Take 1 tablet (81 mg total) by mouth daily. 30 tablet 11  . glucose blood (ACCU-CHEK AVIVA PLUS) test strip Use as instructed 100 each 12  . glucose blood (ACCU-CHEK AVIVA) test strip Use as instructed 100 each 12  . Lancet Devices (ACCU-CHEK SOFTCLIX) lancets Use as instructed 1 each 0  . azithromycin (ZITHROMAX Z-PAK) 250 MG tablet Take as directed (Patient not taking: Reported on 06/03/2016) 6 each 0  . benzonatate (TESSALON) 100 MG capsule Take 1 capsule (100 mg total) by mouth every 8 (eight) hours. 15 capsule 0  . cephALEXin (KEFLEX) 500 MG capsule Take 1 capsule (500 mg total) by mouth 3 (three) times daily. (Patient not taking: Reported on 06/03/2016) 21 capsule 0  . cyclobenzaprine (FLEXERIL) 5 MG tablet Take 1 tablet (5 mg total) by mouth 3  (three) times daily as needed for muscle spasms. (Patient not taking: Reported on 06/03/2016) 12 tablet 0  . fluticasone (FLONASE) 50 MCG/ACT nasal spray Place 2 sprays into both nostrils daily. (Patient not taking: Reported on 06/03/2016) 16 g 2  . indomethacin (INDOCIN) 25 MG capsule Take 1 capsule (25 mg total) by mouth 3 (three) times daily as needed. 30 capsule 0  . naproxen (NAPROSYN) 500 MG tablet Take 1 tablet (500 mg total) by mouth 2 (two) times daily. (Patient not taking: Reported on 06/03/2016) 30 tablet 0  . oxyCODONE-acetaminophen (PERCOCET/ROXICET) 5-325 MG tablet Take 1 tablet by mouth every 4 (four) hours as needed. (Patient not taking: Reported on 06/03/2016) 15 tablet 0  . [DISCONTINUED] tadalafil (CIALIS) 20 MG tablet Take 0.5-1 tablets (10-20 mg total) by mouth every other day as needed for erectile dysfunction. (Patient not taking: Reported on 09/19/2014) 5 tablet 3   No current facility-administered medications on file prior to visit.    Family History  Problem Relation Age of Onset  . Colon cancer Neg Hx   . Esophageal cancer Neg Hx   . Rectal cancer Neg Hx   . Stomach cancer Neg Hx    Social History   Social History  . Marital status: Divorced    Spouse name: N/A  . Number of children: N/A  . Years of education: N/A  Occupational History  . Not on file.   Social History Main Topics  . Smoking status: Current Every Day Smoker    Packs/day: 0.50    Types: Cigarettes  . Smokeless tobacco: Never Used  . Alcohol use No  . Drug use: No  . Sexual activity: Not on file   Other Topics Concern  . Not on file   Social History Narrative  . No narrative on file    Objective:   Vitals:   06/03/16 1454  BP: (!) 159/89  Pulse: (!) 52  Resp: 16  Temp: 98.5 F (36.9 C)    Filed Weights   06/03/16 1454  Weight: 182 lb (82.6 kg)    BP Readings from Last 3 Encounters:  06/03/16 (!) 159/89  01/23/16 143/82  01/05/16 (!) 146/84    Physical  Exam: Constitutional: Patient appears well-developed and well-nourished. No distress. AAOx3, pleasant.  HENT: Normocephalic, atraumatic, External right and left ear normal. Oropharynx is clear and moist.  Right tm clear, left ear canal w/ ceruminosis Eyes: Conjunctivae and EOM are normal. PERRL, no scleral icterus. Neck: Normal ROM. Neck supple. No JVD.  CVS: RRR, S1/S2 +, bradycardic, no murmurs, no gallops, no carotid bruit.  Pulmonary: Effort and breath sounds normal, no stridor, rhonchi, wheezes, rales.  Abdominal: Soft. BS +, no distension, tenderness, rebound or guarding.  Musculoskeletal: Normal range of motion. No edema and no tenderness.  LE: bilat/ no c/c/e, pulses 2+ bilateral. Neuro: Alert. muscle tone coordination wnl. No cranial nerve deficit grossly. Skin: Skin is warm and dry. No rash noted. Not diaphoretic. No erythema. No pallor. Psychiatric: Normal mood and affect. Behavior, judgment, thought content normal.  Lab Results  Component Value Date   WBC 11.4 (H) 10/19/2013   HGB 16.7 10/19/2013   HCT 46.1 10/19/2013   MCV 84.6 10/19/2013   PLT 222 10/19/2013   Lab Results  Component Value Date   CREATININE 1.28 01/05/2016   BUN 10 01/05/2016   NA 139 01/05/2016   K 4.0 01/05/2016   CL 104 01/05/2016   CO2 22 01/05/2016    Lab Results  Component Value Date   HGBA1C 6.2 06/03/2016   Lipid Panel     Component Value Date/Time   CHOL 159 01/05/2016 1155   TRIG 149 01/05/2016 1155   HDL 27 (L) 01/05/2016 1155   CHOLHDL 5.9 (H) 01/05/2016 1155   VLDL 30 01/05/2016 1155   LDLCALC 102 01/05/2016 1155       Depression screen PHQ 2/9 06/03/2016 05/10/2013 04/19/2013  Decreased Interest 0 0 0  Down, Depressed, Hopeless 0 0 0  PHQ - 2 Score 0 0 0    Assessment and plan:     1. Essential hypertension, w/ bradycardia today, no prior MI/cad Uncontrolled, on numerous meds, w/ mild c/o of ED; never started hctz. - dc bb/metorprolol 25 qd due to bradycardia (may  help resolve the ED as well) - amLODipine (NORVASC) 10 MG tablet; Take 1 tablet (10 mg total) by mouth daily.  Dispense: 90 tablet; Refill: 3 - hydrALAZINE (APRESOLINE) 25 MG tablet; Take 1 tablet (25 mg total) by mouth 3 (three) times daily.  Dispense: 270 tablet; Refill: 2 -start Prinzide 10-12.5 qday, room to titrate up. - BASIC METABOLIC PANEL WITH GFR - CBC with Differential/Platelet - Low salt/dash diet recd, increase exercise.  2. Type 2 diabetes mellitus without complication, with long-term current use of insulin ([facility]) -  POCT A1C 6.2  - metFORMIN (GLUCOPHAGE XR) 500 MG 24 hr  tablet; Take 1 tablet (500 mg total) by mouth daily with breakfast.  Dispense: 90 tablet; Refill: 3 - Microalbumin/Creatinine Ratio, Urine - low carb/ada again discusses., limit the carbs.  3. High cholesterol - atorvastatin (LIPITOR) 20 MG tablet; Take 1 tablet (20 mg total) by mouth daily.  Dispense: 90 tablet; Refill: 3  4. Prostate cancer screening - PSA, Medicare  5. Ceruminosis, left - carbamide peroxide (DEBROX) 6.5 % otic solution 5 drop; Place 5 drops into the left ear once.  6. Need for hepatitis C screening test - Hepatitis C antibody  7. tdap today.   Return in about 3 months (around 09/03/2016).  The patient was given clear instructions to go to ER or return to medical center if symptoms don't improve, worsen or new problems develop. The patient verbalized understanding. The patient was told to call to get lab results if they haven't heard anything in the next week.    This note has been created with Surveyor, quantity. Any transcriptional errors are unintentional.   Maren Reamer, MD, [place], [place]   06/03/2016, 4:50 PM

## 2016-06-03 NOTE — Patient Instructions (Addendum)
Diabetes Mellitus and Food It is important for you to manage your blood sugar (glucose) level. Your blood glucose level can be greatly affected by what you eat. Eating healthier foods in the appropriate amounts throughout the day at about the same time each day will help you control your blood glucose level. It can also help slow or prevent worsening of your diabetes mellitus. Healthy eating may even help you improve the level of your blood pressure and reach or maintain a healthy weight.  General recommendations for healthful eating and cooking habits include:  Eating meals and snacks regularly. Avoid going long periods of time without eating to lose weight.  Eating a diet that consists mainly of plant-based foods, such as fruits, vegetables, nuts, legumes, and whole grains.  Using low-heat cooking methods, such as baking, instead of high-heat cooking methods, such as deep frying. Work with your dietitian to make sure you understand how to use the Nutrition Facts information on food labels. HOW CAN FOOD AFFECT ME? Carbohydrates Carbohydrates affect your blood glucose level more than any other type of food. Your dietitian will help you determine how many carbohydrates to eat at each meal and teach you how to count carbohydrates. Counting carbohydrates is important to keep your blood glucose at a healthy level, especially if you are using insulin or taking certain medicines for diabetes mellitus. Alcohol Alcohol can cause sudden decreases in blood glucose (hypoglycemia), especially if you use insulin or take certain medicines for diabetes mellitus. Hypoglycemia can be a life-threatening condition. Symptoms of hypoglycemia (sleepiness, dizziness, and disorientation) are similar to symptoms of having too much alcohol.  If your health care provider has given you approval to drink alcohol, do so in moderation and use the following guidelines:  Women should not have more than one drink per day, and men  should not have more than two drinks per day. One drink is equal to:  12 oz of beer.  5 oz of wine.  1 oz of hard liquor.  Do not drink on an empty stomach.  Keep yourself hydrated. Have water, diet soda, or unsweetened iced tea.  Regular soda, juice, and other mixers might contain a lot of carbohydrates and should be counted. WHAT FOODS ARE NOT RECOMMENDED? As you make food choices, it is important to remember that all foods are not the same. Some foods have fewer nutrients per serving than other foods, even though they might have the same number of calories or carbohydrates. It is difficult to get your body what it needs when you eat foods with fewer nutrients. Examples of foods that you should avoid that are high in calories and carbohydrates but low in nutrients include:  Trans fats (most processed foods list trans fats on the Nutrition Facts label).  Regular soda.  Juice.  Candy.  Sweets, such as cake, pie, doughnuts, and cookies.  Fried foods. WHAT FOODS CAN I EAT? Eat nutrient-rich foods, which will nourish your body and keep you healthy. The food you should eat also will depend on several factors, including:  The calories you need.  The medicines you take.  Your weight.  Your blood glucose level.  Your blood pressure level.  Your cholesterol level. You should eat a variety of foods, including:  Protein.  Lean cuts of meat.  Proteins low in saturated fats, such as fish, egg whites, and beans. Avoid processed meats.  Fruits and vegetables.  Fruits and vegetables that may help control blood glucose levels, such as apples, mangoes, and   yams.  Dairy products.  Choose fat-free or low-fat dairy products, such as milk, yogurt, and cheese.  Grains, bread, pasta, and rice.  Choose whole grain products, such as multigrain bread, whole oats, and brown rice. These foods may help control blood pressure.  Fats.  Foods containing healthful fats, such as nuts,  avocado, olive oil, canola oil, and fish. DOES EVERYONE WITH DIABETES MELLITUS HAVE THE SAME MEAL PLAN? Because every person with diabetes mellitus is different, there is not one meal plan that works for everyone. It is very important that you meet with a dietitian who will help you create a meal plan that is just right for you.   This information is not intended to replace advice given to you by your health care provider. Make sure you discuss any questions you have with your health care provider.   Document Released: 07/11/2005 Document Revised: 11/04/2014 Document Reviewed: 09/10/2013 Elsevier Interactive Patient Education 2016 [place] for Eating Away From Home If You Have Diabetes Controlling your level of blood glucose, also known as blood sugar, can be challenging. It can be even more difficult when you do not prepare your own meals. The following tips can help you manage your diabetes when you eat away from home. PLANNING AHEAD Plan ahead if you know you will be eating away from home:  Ask your health care provider how to time meals and medicine if you are taking insulin.  Make a list of restaurants near you that offer healthy choices. If they have a carry-out menu, take it home and plan what you will order ahead of time.  Look up the restaurant you want to eat at online. Many chain and fast-food restaurants list nutritional information online. Use this information to choose the healthiest options and to calculate how many carbohydrates will be in your meal.  Use a carbohydrate-counting book or mobile app to look up the carbohydrate content and serving size of the foods you want to eat.  Become familiar with serving sizes and learn to recognize how many servings are in a portion. This will allow you to estimate how many carbohydrates you can eat. FREE FOODS A "free food" is any food or drink that has less than 5 g of carbohydrates per serving. Free foods  include:  Many vegetables.  Hard boiled eggs.  Nuts or seeds.  Olives.  Cheeses.  Meats. These types of foods make good appetizer choices and are often available at salad bars. Lemon juice, vinegar, or a low-calorie salad dressing of fewer than 20 calories per serving can be used as a "free" salad dressing.  CHOICES TO REDUCE CARBOHYDRATES  Substitute nonfat sweetened yogurt with a sugar-free yogurt. Yogurt made from soy milk may also be used, but you will still want a sugar-free or plain option to choose a lower carbohydrate amount.  Ask your server to take away the bread basket or chips from your table.  Order fresh fruit. A salad bar often offers fresh fruit choices. Avoid canned fruit because it is usually packed in sugar or syrup.  Order a salad, and eat it without dressing. Or, create a "free" salad dressing.  Ask for substitutions. For example, instead of [place] fries, request an order of a vegetable such as salad, green beans, or broccoli. OTHER TIPS   If you take insulin, take the insulin once your food arrives to your table. This will ensure your insulin and food are timed correctly.  Ask your server about the  portion size before your order, and ask for a take-out box if the portion has more servings than you should have. When your food comes, leave the amount you should have on the plate, and put the rest in the take-out box.  Consider splitting an entree with someone and ordering a side salad.   This information is not intended to replace advice given to you by your health care provider. Make sure you discuss any questions you have with your health care provider.   Document Released: 10/14/2005 Document Revised: 07/05/2015 Document Reviewed: 01/11/2014 Elsevier Interactive Patient Education 2016 [place] DASH stands for "Dietary Approaches to Stop Hypertension." The DASH eating plan is a healthy eating plan that has been shown to reduce  high blood pressure (hypertension). Additional health benefits may include reducing the risk of type 2 diabetes mellitus, heart disease, and stroke. The DASH eating plan may also help with weight loss. WHAT DO I NEED TO KNOW ABOUT THE DASH EATING PLAN? For the DASH eating plan, you will follow these general guidelines:  Choose foods with a percent daily value for sodium of less than 5% (as listed on the food label).  Use salt-free seasonings or herbs instead of table salt or sea salt.  Check with your health care provider or pharmacist before using salt substitutes.  Eat lower-sodium products, often labeled as "lower sodium" or "no salt added."  Eat fresh foods.  Eat more vegetables, fruits, and low-fat dairy products.  Choose whole grains. Look for the word "whole" as the first word in the ingredient list.  Choose fish and skinless chicken or [place] more often than red meat. Limit fish, poultry, and meat to 6 oz (170 g) each day.  Limit sweets, desserts, sugars, and sugary drinks.  Choose heart-healthy fats.  Limit cheese to 1 oz (28 g) per day.  Eat more home-cooked food and less restaurant, buffet, and fast food.  Limit fried foods.  Cook foods using methods other than frying.  Limit canned vegetables. If you do use them, rinse them well to decrease the sodium.  When eating at a restaurant, ask that your food be prepared with less salt, or no salt if possible. WHAT FOODS CAN I EAT? Seek help from a dietitian for individual calorie needs. Grains Whole grain or whole wheat bread. Brown rice. Whole grain or whole wheat pasta. Quinoa, bulgur, and whole grain cereals. Low-sodium cereals. Corn or whole wheat flour tortillas. Whole grain cornbread. Whole grain crackers. Low-sodium crackers. Vegetables Fresh or frozen vegetables (raw, steamed, roasted, or grilled). Low-sodium or reduced-sodium tomato and vegetable juices. Low-sodium or reduced-sodium tomato sauce and paste.  Low-sodium or reduced-sodium canned vegetables.  Fruits All fresh, canned (in natural juice), or frozen fruits. Meat and Other Protein Products Ground beef (85% or leaner), grass-fed beef, or beef trimmed of fat. Skinless chicken or [place]. Ground chicken or [place]. Pork trimmed of fat. All fish and seafood. Eggs. Dried beans, peas, or lentils. Unsalted nuts and seeds. Unsalted canned beans. Dairy Low-fat dairy products, such as skim or 1% milk, 2% or reduced-fat cheeses, low-fat ricotta or cottage cheese, or plain low-fat yogurt. Low-sodium or reduced-sodium cheeses. Fats and Oils Tub margarines without trans fats. Light or reduced-fat mayonnaise and salad dressings (reduced sodium). Avocado. Safflower, olive, or canola oils. Natural peanut or almond butter. Other Unsalted popcorn and pretzels. The items listed above may not be a complete list of recommended foods or beverages. Contact your dietitian for more options.  WHAT FOODS ARE NOT RECOMMENDED? Grains White bread. White pasta. White rice. Refined cornbread. Bagels and croissants. Crackers that contain trans fat. Vegetables Creamed or fried vegetables. Vegetables in a cheese sauce. Regular canned vegetables. Regular canned tomato sauce and paste. Regular tomato and vegetable juices. Fruits Dried fruits. Canned fruit in light or heavy syrup. Fruit juice. Meat and Other Protein Products Fatty cuts of meat. Ribs, chicken wings, bacon, sausage, bologna, salami, chitterlings, fatback, hot dogs, bratwurst, and packaged luncheon meats. Salted nuts and seeds. Canned beans with salt. Dairy Whole or 2% milk, cream, half-and-half, and cream cheese. Whole-fat or sweetened yogurt. Full-fat cheeses or blue cheese. Nondairy creamers and whipped toppings. Processed cheese, cheese spreads, or cheese curds. Condiments Onion and garlic salt, seasoned salt, table salt, and sea salt. Canned and packaged gravies. Worcestershire sauce. Tartar sauce. Barbecue  sauce. Teriyaki sauce. Soy sauce, including reduced sodium. Steak sauce. Fish sauce. Oyster sauce. Cocktail sauce. Horseradish. Ketchup and mustard. Meat flavorings and tenderizers. Bouillon cubes. Hot sauce. Tabasco sauce. Marinades. Taco seasonings. Relishes. Fats and Oils Butter, stick margarine, lard, shortening, ghee, and bacon fat. Coconut, palm kernel, or palm oils. Regular salad dressings. Other Pickles and olives. Salted popcorn and pretzels. The items listed above may not be a complete list of foods and beverages to avoid. Contact your dietitian for more information. WHERE CAN I FIND MORE INFORMATION? National Heart, Lung, and Blood Institute: travelstabloid.com   This information is not intended to replace advice given to you by your health care provider. Make sure you discuss any questions you have with your health care provider.   Document Released: 10/03/2011 Document Revised: 11/04/2014 Document Reviewed: 08/18/2013 Elsevier Interactive Patient Education 2016 Elsevier Inc.   -  Hypertension Hypertension is another name for high blood pressure. High blood pressure forces your heart to work harder to pump blood. A blood pressure reading has two numbers, which includes a higher number over a lower number (example: 110/72). HOME CARE   Have your blood pressure rechecked by your doctor.  Only take medicine as told by your doctor. Follow the directions carefully. The medicine does not work as well if you skip doses. Skipping doses also puts you at risk for problems.  Do not smoke.  Monitor your blood pressure at home as told by your doctor. GET HELP IF:  You think you are having a reaction to the medicine you are taking.  You have repeat headaches or feel dizzy.  You have puffiness (swelling) in your ankles.  You have trouble with your vision. GET HELP RIGHT AWAY IF:   You get a very bad headache and are confused.  You feel weak,  numb, or faint.  You get chest or belly (abdominal) pain.  You throw up (vomit).  You cannot breathe very well. MAKE SURE YOU:   Understand these instructions.  Will watch your condition.  Will get help right away if you are not doing well or get worse.   This information is not intended to replace advice given to you by your health care provider. Make sure you discuss any questions you have with your health care provider.   Document Released: 04/01/2008 Document Revised: 10/19/2013 Document Reviewed: 08/06/2013 Elsevier Interactive Patient Education 2016 [place] Can Quit Smoking If you are ready to quit smoking or are thinking about it, congratulations! You have chosen to help yourself be healthier and live longer! There are lots of different ways to quit smoking. Nicotine gum, nicotine patches,  a nicotine inhaler, or nicotine nasal spray can help with physical craving. Hypnosis, support groups, and medicines help break the habit of smoking. TIPS TO GET OFF AND STAY OFF CIGARETTES  Learn to predict your moods. Do not let a bad situation be your excuse to have a cigarette. Some situations in your life might tempt you to have a cigarette.  Ask friends and co-workers not to smoke around you.  Make your home smoke-free.  Never have "just one" cigarette. It leads to wanting another and another. Remind yourself of your decision to quit.  On a card, make a list of your reasons for not smoking. Read it at least the same number of times a day as you have a cigarette. Tell yourself everyday, "I do not want to smoke. I choose not to smoke."  Ask someone at home or work to help you with your plan to quit smoking.  Have something planned after you eat or have a cup of coffee. Take a walk or get other exercise to perk you up. This will help to keep you from overeating.  Try a relaxation exercise to calm you down and decrease your stress. Remember, you may be tense and  nervous the first two weeks after you quit. This will pass.  Find new activities to keep your hands busy. Play with a pen, coin, or rubber band. Doodle or draw things on paper.  Brush your teeth right after eating. This will help cut down the craving for the taste of tobacco after meals. You can try mouthwash too.  Try gum, breath mints, or diet candy to keep something in your mouth. IF YOU SMOKE AND WANT TO QUIT:  Do not stock up on cigarettes. Never buy a carton. Wait until one pack is finished before you buy another.  Never carry cigarettes with you at work or at home.  Keep cigarettes as far away from you as possible. Leave them with someone else.  Never carry matches or a lighter with you.  Ask yourself, "Do I need this cigarette or is this just a reflex?"  Bet with someone that you can quit. Put cigarette money in a piggy bank every morning. If you smoke, you give up the money. If you do not smoke, by the end of the week, you keep the money.  Keep trying. It takes 21 days to change a habit!  Talk to your doctor about using medicines to help you quit. These include nicotine replacement gum, lozenges, or skin patches.   This information is not intended to replace advice given to you by your health care provider. Make sure you discuss any questions you have with your health care provider.   Document Released: 08/10/2009 Document Revised: 01/06/2012 Document Reviewed: 08/10/2009 Elsevier Interactive Patient Education 2016 [employer].  Tdap Vaccine (Tetanus, Diphtheria and Pertussis): What You Need to Know 1. Why get vaccinated? Tetanus, diphtheria and pertussis are very serious diseases. Tdap vaccine can protect [place] from these diseases. And, Tdap vaccine given to pregnant women can protect newborn babies against pertussis. TETANUS (Lockjaw) is rare in the [place] today. It causes painful muscle tightening and stiffness, usually all over the body.  It can lead to  tightening of muscles in the head and neck so you can't open your mouth, swallow, or sometimes even breathe. Tetanus kills about 1 out of 10 people who are infected even after receiving the best medical care. DIPHTHERIA is also rare in the [place] today. It can  cause a thick coating to form in the back of the throat.  It can lead to breathing problems, heart failure, paralysis, and death. PERTUSSIS (Whooping Cough) causes severe coughing spells, which can cause difficulty breathing, vomiting and disturbed sleep.  It can also lead to weight loss, incontinence, and rib fractures. Up to 2 in 100 adolescents and 5 in 100 adults with pertussis are hospitalized or have complications, which could include pneumonia or death. These diseases are caused by bacteria. Diphtheria and pertussis are spread from person to person through secretions from coughing or sneezing. Tetanus enters the body through cuts, scratches, or wounds. Before vaccines, as many as 200,000 cases of diphtheria, 200,000 cases of pertussis, and hundreds of cases of tetanus, were reported in the [place] each year. Since vaccination began, reports of cases for tetanus and diphtheria have dropped by about 99% and for pertussis by about 80%. 2. Tdap vaccine Tdap vaccine can protect adolescents and adults from tetanus, diphtheria, and pertussis. One dose of Tdap is routinely given at age 34 or 75. People who did not get Tdap at that age should get it as soon as possible. Tdap is especially important for healthcare professionals and anyone having close contact with a baby younger than 12 months. Pregnant women should get a dose of Tdap during every pregnancy, to protect the newborn from pertussis. Infants are most at risk for severe, life-threatening complications from pertussis. Another vaccine, called Td, protects against tetanus and diphtheria, but not pertussis. A Td booster should be given every 10 years. Tdap may be given as one of  these boosters if you have never gotten Tdap before. Tdap may also be given after a severe cut or burn to prevent tetanus infection. Your doctor or the person giving you the vaccine can give you more information. Tdap may safely be given at the same time as other vaccines. 3. Some people should not get this vaccine  A person who has ever had a life-threatening allergic reaction after a previous dose of any diphtheria, tetanus or pertussis containing vaccine, OR has a severe allergy to any part of this vaccine, should not get Tdap vaccine. Tell the person giving the vaccine about any severe allergies.  Anyone who had coma or long repeated seizures within 7 days after a childhood dose of DTP or DTaP, or a previous dose of Tdap, should not get Tdap, unless a cause other than the vaccine was found. They can still get Td.  Talk to your doctor if you:  have seizures or another nervous system problem,  had severe pain or swelling after any vaccine containing diphtheria, tetanus or pertussis,  ever had a condition called Guillain-Barr Syndrome (GBS),  aren't feeling well on the day the shot is scheduled. 4. Risks With any medicine, including vaccines, there is a chance of side effects. These are usually mild and go away on their own. Serious reactions are also possible but are rare. Most people who get Tdap vaccine do not have any problems with it. Mild problems following Tdap (Did not interfere with activities)  Pain where the shot was given (about 3 in 4 adolescents or 2 in 3 adults)  Redness or swelling where the shot was given (about 1 person in 5)  Mild fever of at least 100.62F (up to about 1 in 25 adolescents or 1 in 100 adults)  Headache (about 3 or 4 people in 10)  Tiredness (about 1 person in 3 or 4)  Nausea, vomiting, diarrhea, stomach  ache (up to 1 in 4 adolescents or 1 in 10 adults)  Chills, sore joints (about 1 person in 10)  Body aches (about 1 person in 3 or 4)  Rash,  swollen glands (uncommon) Moderate problems following Tdap (Interfered with activities, but did not require medical attention)  Pain where the shot was given (up to 1 in 5 or 6)  Redness or swelling where the shot was given (up to about 1 in 16 adolescents or 1 in 12 adults)  Fever over 102F (about 1 in 100 adolescents or 1 in 250 adults)  Headache (about 1 in 7 adolescents or 1 in 10 adults)  Nausea, vomiting, diarrhea, stomach ache (up to 1 or 3 people in 100)  Swelling of the entire arm where the shot was given (up to about 1 in 500). Severe problems following Tdap (Unable to perform usual activities; required medical attention)  Swelling, severe pain, bleeding and redness in the arm where the shot was given (rare). Problems that could happen after any vaccine:  People sometimes faint after a medical procedure, including vaccination. Sitting or lying down for about 15 minutes can help prevent fainting, and injuries caused by a fall. Tell your doctor if you feel dizzy, or have vision changes or ringing in the ears.  Some people get severe pain in the shoulder and have difficulty moving the arm where a shot was given. This happens very rarely.  Any medication can cause a severe allergic reaction. Such reactions from a vaccine are very rare, estimated at fewer than 1 in a million doses, and would happen within a few minutes to a few hours after the vaccination. As with any medicine, there is a very remote chance of a vaccine causing a serious injury or death. The safety of vaccines is always being monitored. For more information, visit: http://www.aguilar.org/ 5. What if there is a serious problem? What should I look for?  Look for anything that concerns you, such as signs of a severe allergic reaction, very high fever, or unusual behavior.  Signs of a severe allergic reaction can include hives, swelling of the face and throat, difficulty breathing, a fast heartbeat, dizziness,  and weakness. These would usually start a few minutes to a few hours after the vaccination. What should I do?  If you think it is a severe allergic reaction or other emergency that can't wait, call 9-1-1 or get the person to the nearest hospital. Otherwise, call your doctor.  Afterward, the reaction should be reported to the Vaccine Adverse Event Reporting System (VAERS). Your doctor might file this report, or you can do it yourself through the VAERS web site at www.vaers.SamedayNews.es, or by calling 8643511699. VAERS does not give medical advice.  6. The National Vaccine Injury Compensation Program The [employer] Vaccine Injury Compensation Program (VICP) is a federal program that was created to compensate people who may have been injured by certain vaccines. Persons who believe they may have been injured by a vaccine can learn about the program and about filing a claim by calling 937-512-6498 or visiting the [place] website at GoldCloset.com.ee. There is a time limit to file a claim for compensation. 7. How can I learn more?  Ask your doctor. He or she can give you the vaccine package insert or suggest other sources of information.  Call your local or state health department.  Contact the Centers for Disease Control and Prevention (CDC):  Call 217-713-9173 (1-800-CDC-INFO) or  Visit CDC's website at http://hunter.com/ CDC Tdap  Vaccine VIS (12/21/13)   This information is not intended to replace advice given to you by your health care provider. Make sure you discuss any questions you have with your health care provider.   Document Released: 04/14/2012 Document Revised: 11/04/2014 Document Reviewed: 01/26/2014 Elsevier Interactive Patient Education [employer].

## 2016-06-04 LAB — BASIC METABOLIC PANEL WITH GFR
BUN: 10 mg/dL (ref 7–25)
CALCIUM: 9.6 mg/dL (ref 8.6–10.3)
CO2: 21 mmol/L (ref 20–31)
Chloride: 108 mmol/L (ref 98–110)
Creat: 1.12 mg/dL (ref 0.70–1.33)
GFR, EST NON AFRICAN AMERICAN: 73 mL/min (ref >=60)
GFR, Est African American: 84 mL/min (ref >=60)
GLUCOSE: 95 mg/dL (ref 65–99)
POTASSIUM: 3.9 mmol/L (ref 3.5–5.3)
SODIUM: 142 mmol/L (ref 135–146)

## 2016-06-04 LAB — CBC WITH DIFFERENTIAL/PLATELET
BASOS ABS: 0 {cells}/uL (ref 0–200)
Basophils Relative: 0 %
EOS PCT: 2 %
Eosinophils Absolute: 186 cells/uL (ref 15–500)
HCT: 47.1 % (ref 38.5–50.0)
Hemoglobin: 15.8 g/dL (ref 13.2–17.1)
Lymphocytes Relative: 43 %
Lymphs Abs: 3999 cells/uL — ABNORMAL HIGH (ref 850–3900)
MCH: 28.1 pg (ref 27.0–33.0)
MCHC: 33.5 g/dL (ref 32.0–36.0)
MCV: 83.8 fL (ref 80.0–100.0)
MONOS PCT: 8 %
MPV: 10.5 fL (ref 7.5–12.5)
Monocytes Absolute: 744 cells/uL (ref 200–950)
NEUTROS ABS: 4371 {cells}/uL (ref 1500–7800)
Neutrophils Relative %: 47 %
PLATELETS: 294 10*3/uL (ref 140–400)
RBC: 5.62 MIL/uL (ref 4.20–5.80)
RDW: 14.5 % (ref 11.0–15.0)
WBC: 9.3 10*3/uL (ref 3.8–10.8)

## 2016-06-04 LAB — PSA: PSA: 2.6 ng/mL (ref <=4.0)

## 2016-06-04 LAB — MICROALBUMIN / CREATININE URINE RATIO
CREATININE, URINE: 145 mg/dL (ref 20–370)
MICROALB UR: 0.6 mg/dL
MICROALB/CREAT RATIO: 4 ug/mg{creat} (ref <30)

## 2016-06-04 LAB — HEPATITIS C ANTIBODY: HCV Ab: NEGATIVE

## 2016-06-05 ENCOUNTER — Telehealth: Payer: Self-pay

## 2016-06-05 NOTE — Telephone Encounter (Signed)
Contacted pt to go over lab results pt is aware and doesn't questions or concerns

## 2016-06-11 IMAGING — DX DG CHEST 2V
2 series · 2 of 2 positions shown · non-contrast
Comparison: 08/13/2014

CLINICAL DATA: Difficulty breathing for 3 days

EXAM:
CHEST - 2 VIEW

[chest pa]
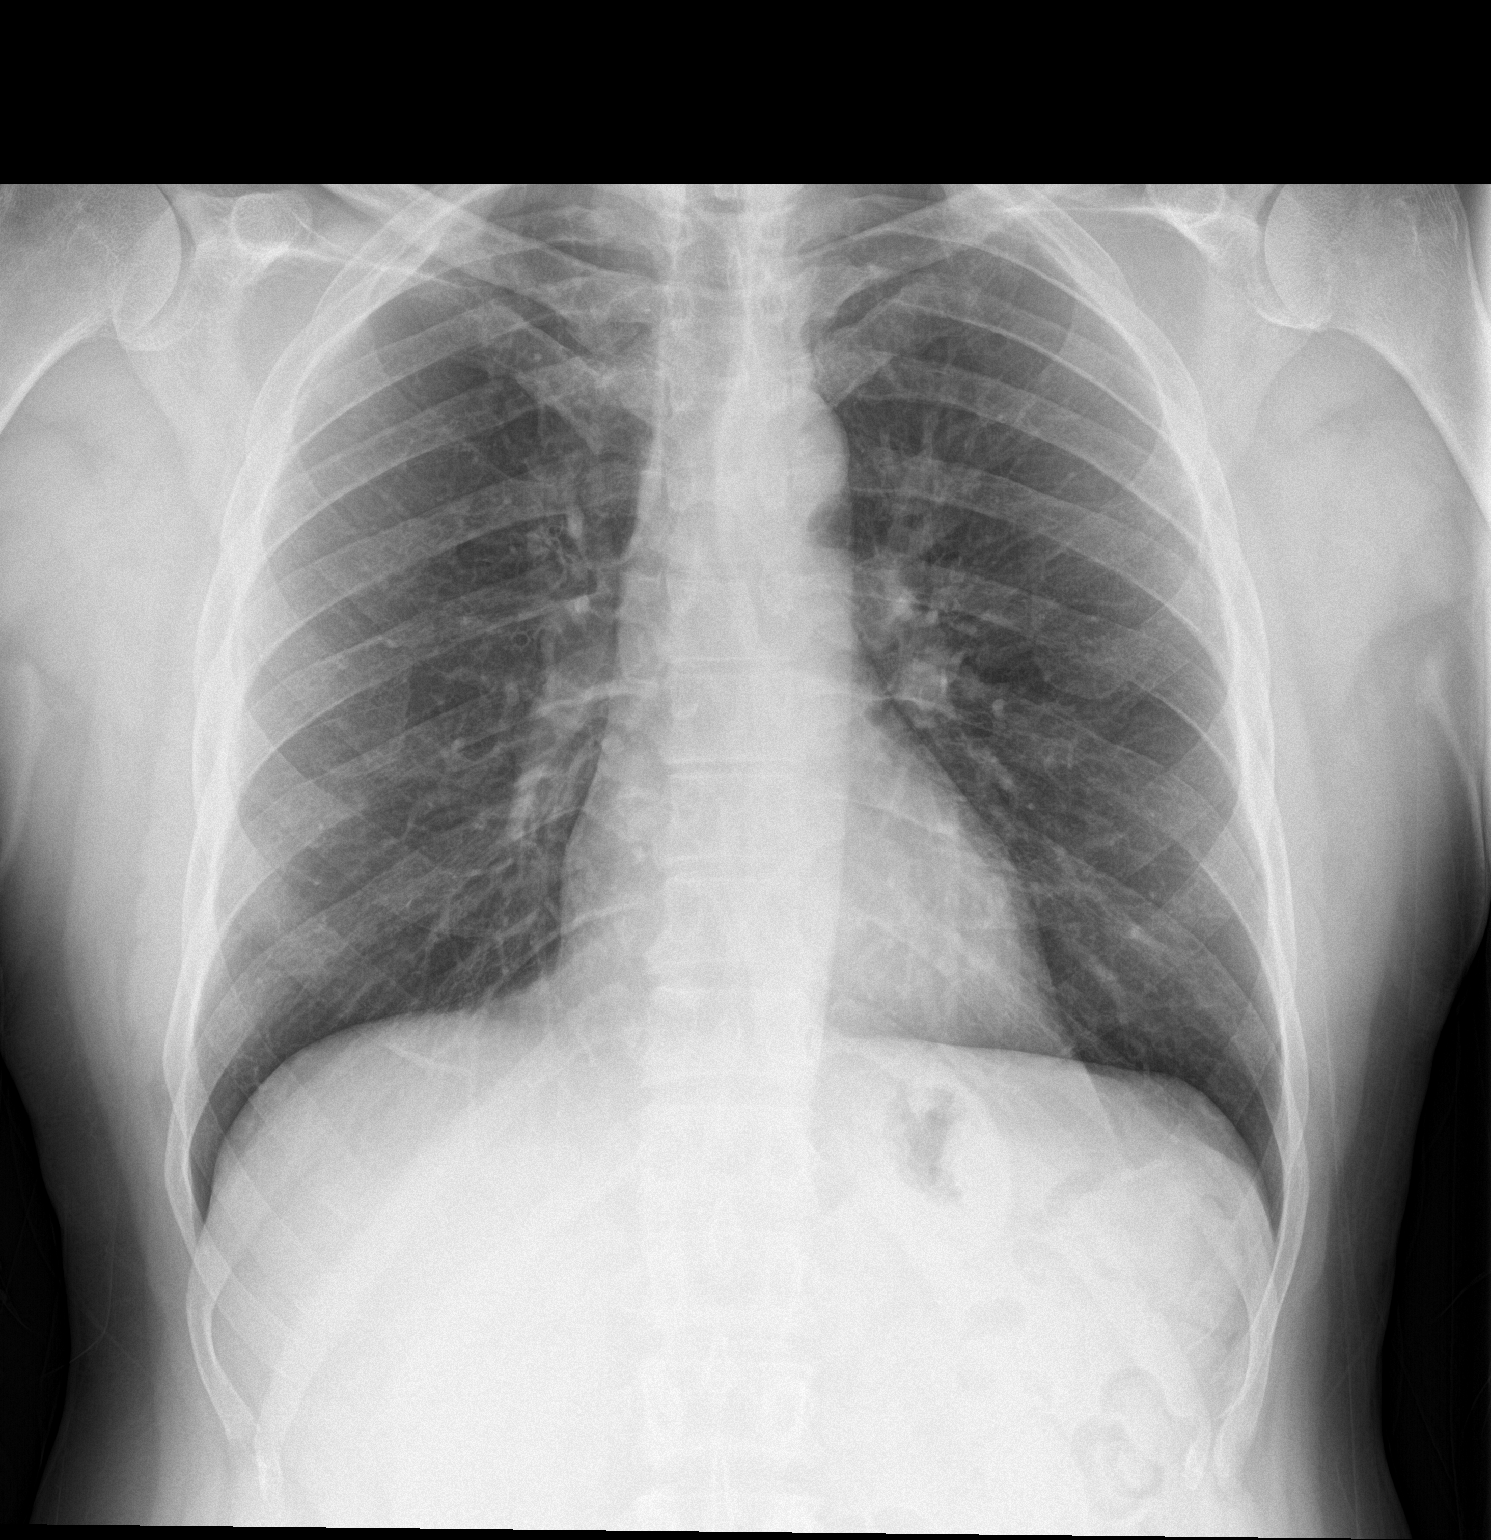

[chest lat]
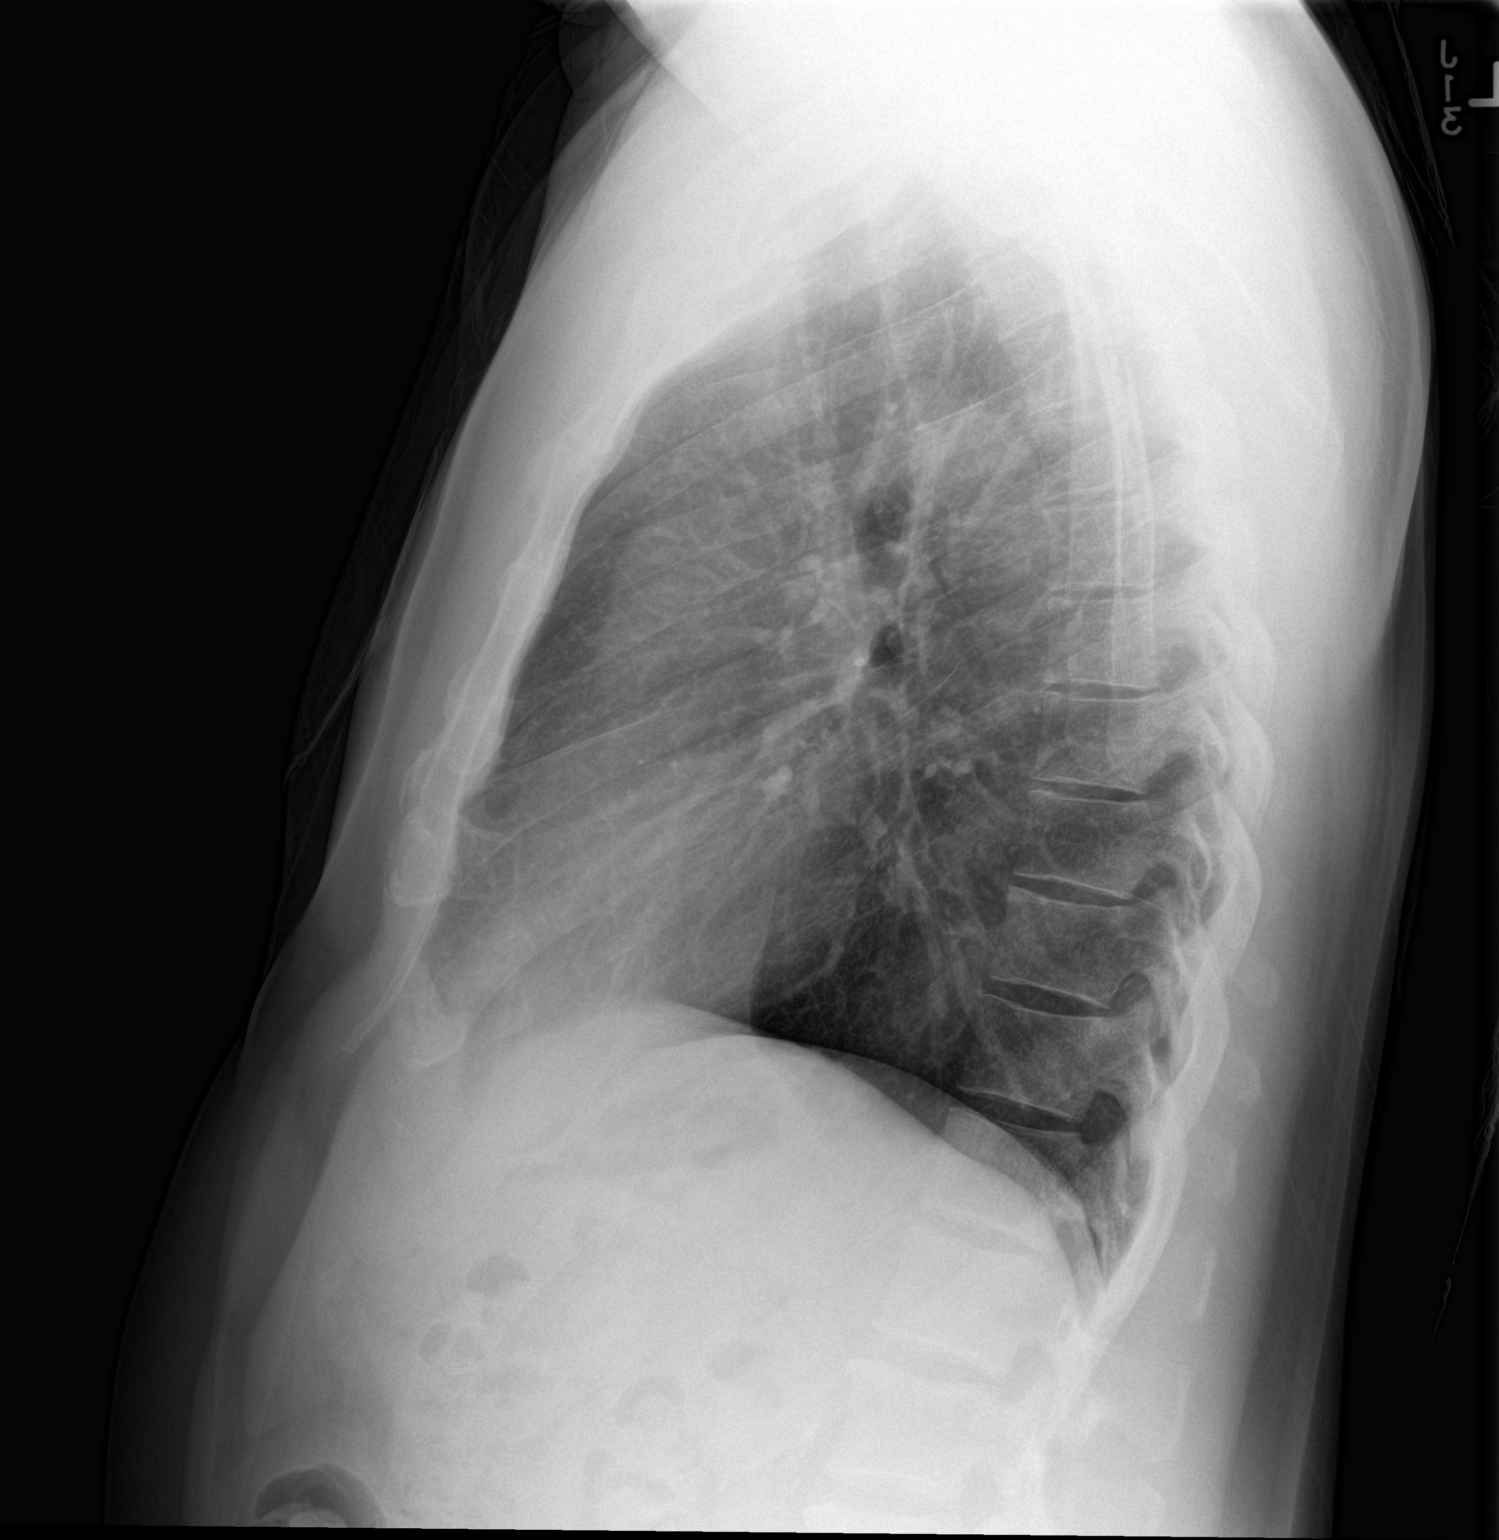

[2 of 2 positions shown; findings below may reference images not displayed]

FINDINGS: Cardiac shadow is within normal limits. The lungs are well aerated
bilaterally. No focal infiltrate or sizable effusion is noted.
IMPRESSION: No acute abnormality noted.

## 2016-06-25 ENCOUNTER — Emergency Department (HOSPITAL_COMMUNITY)
Admission: EM | Admit: 2016-06-25 | Discharge: 2016-06-25 | Disposition: A | Discharge status: Home / Self Care | Payer: Medicare Other | Attending: Emergency Medicine | Admitting: Emergency Medicine

## 2016-06-25 ENCOUNTER — Encounter (HOSPITAL_COMMUNITY): Payer: Self-pay | Admitting: Emergency Medicine

## 2016-06-25 DIAGNOSIS — E119 Type 2 diabetes mellitus without complications: Secondary | ICD-10-CM | POA: Insufficient documentation

## 2016-06-25 DIAGNOSIS — F17210 Nicotine dependence, cigarettes, uncomplicated: Secondary | ICD-10-CM | POA: Insufficient documentation

## 2016-06-25 DIAGNOSIS — I10 Essential (primary) hypertension: Secondary | ICD-10-CM | POA: Diagnosis not present

## 2016-06-25 DIAGNOSIS — H6122 Impacted cerumen, left ear: Secondary | ICD-10-CM | POA: Diagnosis not present

## 2016-06-25 DIAGNOSIS — Z79899 Other long term (current) drug therapy: Secondary | ICD-10-CM | POA: Insufficient documentation

## 2016-06-25 DIAGNOSIS — Z7984 Long term (current) use of oral hypoglycemic drugs: Secondary | ICD-10-CM | POA: Insufficient documentation

## 2016-06-25 NOTE — ED Provider Notes (Signed)
[facility] DEPT Provider Note   CSN: [id number] Arrival date & time: 06/25/16  1350  By signing my name below, I, Henry Walker, attest that this documentation has been prepared under the direction and in the presence of Henry Decamp, PA-C. Electronically Signed: Judithe Walker, ER Scribe. 06/08/2016. 2:45 PM.   History   Chief Complaint Chief Complaint  Patient presents with  . Cerumen Impaction   The history is provided by the patient. No language interpreter was used.    HPI Comments: Henry Walker is a 55 y.o. male who presents to the Emergency Department complaining of gradually worsening left sided cerumen impaction for the last three days. [facility] PCP at [facility] gave him drops to put in his ear, which he has been using. He states he has not been able to hear out of the ear since yesterday.   Past Medical History:  Diagnosis Date  . Diabetes mellitus without complication ([place])   . GERD (gastroesophageal reflux disease)   . Hx of adenomatous colonic polyps 10/06/2014  . Hyperlipidemia   . Hypertension     Patient Active Problem List   Diagnosis Date Noted  . Hx of adenomatous colonic polyps 10/06/2014  . Other and unspecified hyperlipidemia 04/12/2014  . GERD (gastroesophageal reflux disease) 04/12/2014  . IFG (impaired fasting glucose) 04/12/2014  . Tobacco use disorder 10/12/2013  . HTN (hypertension) 04/12/2013    Past Surgical History:  Procedure Laterality Date  . BACK SURGERY       Home Medications    Prior to Admission medications   Medication Sig Start Date End Date Taking? Authorizing Provider  amLODipine (NORVASC) 10 MG tablet Take 1 tablet (10 mg total) by mouth daily. 06/03/16   Maren Reamer, MD  aspirin EC 81 MG tablet Take 1 tablet (81 mg total) by mouth daily. 08/04/15   Lance Bosch, NP  atorvastatin (LIPITOR) 20 MG tablet Take 1 tablet (20 mg total) by mouth daily. 06/03/16   Maren Reamer, MD  azithromycin  (ZITHROMAX Z-PAK) 250 MG tablet Take as directed Patient not taking: Reported on 06/03/2016 04/13/15   Lorayne Marek, MD  benzonatate (TESSALON) 100 MG capsule Take 1 capsule (100 mg total) by mouth every 8 (eight) hours. 07/17/15   Carlisle Cater, PA-C  cephALEXin (KEFLEX) 500 MG capsule Take 1 capsule (500 mg total) by mouth 3 (three) times daily. Patient not taking: Reported on 06/03/2016 01/13/15   Lorayne Marek, MD  cyclobenzaprine (FLEXERIL) 5 MG tablet Take 1 tablet (5 mg total) by mouth 3 (three) times daily as needed for muscle spasms. Patient not taking: Reported on 06/03/2016 01/23/16   Gloriann Loan, PA-C  famotidine (PEPCID) 20 MG tablet Take 1 tablet (20 mg total) by mouth 2 (two) times daily. 06/03/16 06/03/17  Maren Reamer, MD  fluticasone (FLONASE) 50 MCG/ACT nasal spray Place 2 sprays into both nostrils daily. Patient not taking: Reported on 06/03/2016 08/04/15   Lance Bosch, NP  glucose blood (ACCU-CHEK AVIVA PLUS) test strip Use as instructed 08/04/15   Lance Bosch, NP  glucose blood (ACCU-CHEK AVIVA) test strip Use as instructed 08/04/15   Lance Bosch, NP  hydrALAZINE (APRESOLINE) 25 MG tablet Take 1 tablet (25 mg total) by mouth 3 (three) times daily. 06/03/16   Maren Reamer, MD  indomethacin (INDOCIN) 25 MG capsule Take 1 capsule (25 mg total) by mouth 3 (three) times daily as needed. 09/06/15   Larene Pickett, PA-C  Lancet Devices (ACCU-CHEK SOFTCLIX) lancets Use as instructed 08/04/15   Lance Bosch, NP  lisinopril-hydrochlorothiazide (PRINZIDE,ZESTORETIC) 10-12.5 MG tablet Take 1 tablet by mouth daily. 06/03/16   Maren Reamer, MD  metFORMIN (GLUCOPHAGE XR) 500 MG 24 hr tablet Take 1 tablet (500 mg total) by mouth daily with breakfast. 06/03/16   Maren Reamer, MD  naproxen (NAPROSYN) 500 MG tablet Take 1 tablet (500 mg total) by mouth 2 (two) times daily. Patient not taking: Reported on 06/03/2016 01/23/16   Gloriann Loan, PA-C  oxyCODONE-acetaminophen (PERCOCET/ROXICET) 5-325 MG  tablet Take 1 tablet by mouth every 4 (four) hours as needed. Patient not taking: Reported on 06/03/2016 09/06/15   Larene Pickett, PA-C    Family History Family History  Problem Relation Age of Onset  . Colon cancer Neg Hx   . Esophageal cancer Neg Hx   . Rectal cancer Neg Hx   . Stomach cancer Neg Hx     Social History Social History  Substance Use Topics  . Smoking status: Current Every Day Smoker    Packs/day: 0.50    Types: Cigarettes  . Smokeless tobacco: Never Used  . Alcohol use No     Allergies   Review of patient's allergies indicates no known allergies.   Review of Systems Review of Systems  Constitutional: Negative for chills and fever.  HENT: Positive for hearing loss. Negative for ear pain and sore throat.   Respiratory: Negative for cough.   Gastrointestinal: Negative for nausea and vomiting.    Physical Exam Updated Vital Signs BP 141/88 (BP Location: Right Arm)   Pulse 97   Temp 98 F (36.7 C) (Oral)   Ht 5\' 6"  (1.676 m)   Wt 183 lb (83 kg)   SpO2 99%   BMI 29.54 kg/m   Physical Exam  Constitutional: He is oriented to person, place, and time. He appears well-developed and well-nourished. No distress.  HENT:  Head: Normocephalic and atraumatic.  Left ear with cerumen impaction. No mastoid tenderness and no no erythema in the auditory canal.  Eyes: Pupils are equal, round, and reactive to light.  Neck: Neck supple.  Cardiovascular: Normal rate.   Pulmonary/Chest: Effort normal. No respiratory distress.  Musculoskeletal: Normal range of motion.  Neurological: He is alert and oriented to person, place, and time. Coordination normal.  Skin: Skin is warm and dry. He is not diaphoretic.  Psychiatric: He has a normal mood and affect. His behavior is normal.  Nursing note and vitals reviewed.  ED Treatments / Results  DIAGNOSTIC STUDIES: Oxygen Saturation is 99% on RA, normal by my interpretation.    COORDINATION OF CARE: 2:46 PM Discussed  treatment plan with pt at bedside which includes debrox ear drops and curette disimpaction and pt agreed to plan.  Labs (all labs ordered are listed, but only abnormal results are displayed) Labs Reviewed - No data to display  EKG  EKG Interpretation None       Radiology No results found.  Procedures .Ear Cerumen Removal Date/Time: 06/25/2016 2:57 PM Performed by: Henry Walker Authorized by: Henry Walker   Consent:    Consent obtained:  Verbal   Consent given by:  Patient   Risks discussed:  Incomplete removal Procedure details:    Location:  L ear   Procedure type: curette   Post-procedure details:    Inspection:  TM intact and macerated skin   Hearing quality:  Normal   Patient tolerance of procedure:  Tolerated well, no immediate complications   (  including critical care time)  Medications Ordered in ED Medications - No data to display   Initial Impression / Assessment and Plan / ED Course  I have reviewed the triage vital signs and the nursing notes.  Pertinent labs & imaging results that were available during my care of the patient were reviewed by me and considered in my medical decision making (see chart for details).  Clinical Course    Final Clinical Impressions(s) / ED Diagnoses  I have reviewed the relevant previous healthcare records. I obtained HPI from historian.  ED Course:  Assessment:Left TM occluded by cerumen. Syringing and manual debridement was used to remove a moderate amount of brown/amber cerumen until TM could be visualized and unremarkable. No erythema. No mastoid tenderness. The patient tolerated the procedure well with no trauma to the canal or TM. Instructions for home care to prevent wax buildup were given.  Disposition/Plan:  DC Home Additional Verbal discharge instructions given and discussed with patient.  Pt Instructed to f/u with PCP in the next week for evaluation and treatment of symptoms. Return precautions given Pt  acknowledges and agrees with plan  Supervising Physician Davonna Belling, MD   Final diagnoses:  Cerumen impaction, left    New Prescriptions New Prescriptions   No medications on file   I personally performed the services described in this documentation, which was scribed in my presence. The recorded information has been reviewed and is accurate.        Henry Decamp, PA-C 06/25/16 [place], MD 06/25/16 [phone]

## 2016-06-25 NOTE — Discharge Instructions (Signed)
Please read and follow all provided instructions.  Your diagnoses today include:  1. Cerumen impaction, left     Tests performed today include: Vital signs. See below for your results today.   Medications prescribed:  Take as prescribed   Home care instructions:  Follow any educational materials contained in this packet.  Follow-up instructions: Please follow-up with your primary care provider for further evaluation of symptoms and treatment   Return instructions:  Please return to the Emergency Department if you do not get better, if you get worse, or new symptoms OR  - Fever (temperature greater than 101.61F)  - Bleeding that does not stop with holding pressure to the area    -Severe pain (please note that you may be more sore the day after your accident)  - Chest Pain  - Difficulty breathing  - Severe nausea or vomiting  - Inability to tolerate food and liquids  - Passing out  - Skin becoming red around your wounds  - Change in mental status (confusion or lethargy)  - New numbness or weakness    Please return if you have any other emergent concerns.  Additional Information:  Your vital signs today were: BP 141/88 (BP Location: Right Arm)    Pulse 97    Temp 98 F (36.7 C) (Oral)    Ht 5\' 6"  (1.676 m)    Wt 83 kg    SpO2 99%    BMI 29.54 kg/m  If your blood pressure (BP) was elevated above 135/85 this visit, please have this repeated by your doctor within one month. ---------------

## 2016-06-25 NOTE — ED Triage Notes (Signed)
Patient reports he cannot hear out of left ear. Patient was seen for this 2 week ago and was told there was a partial blockage by earwax and was prescribed ear drops. Now, the blockage seems to have gotten worse per patient. Denies pain.

## 2016-07-09 ENCOUNTER — Telehealth: Payer: Self-pay | Admitting: Internal Medicine

## 2016-07-09 NOTE — Telephone Encounter (Signed)
Pt. Came into facility saying he was prescribed famotidine (PEPCID) 20 MG tablet And it is not helping his. Pt. States he has been vomiting and can not eat.  He would like to be prescribed another medication. Please f/u

## 2016-07-10 MED ORDER — SUCRALFATE 1 GM/10ML PO SUSP: 1.0000 g | Freq: Three times a day (TID) | ORAL | 0 refills | Status: DC

## 2016-07-10 MED ORDER — PANTOPRAZOLE SODIUM 40 MG PO TBEC: 40.0000 mg | DELAYED_RELEASE_TABLET | Freq: Every day | ORAL | 1 refills | Status: DC

## 2016-07-10 NOTE — Telephone Encounter (Signed)
Please call. Tell him to pick up rx for carafate suspension qid, and protonix 40 qday. Call for appt if no better in 1-2 wks. thx

## 2016-07-10 NOTE — Telephone Encounter (Signed)
Contacted lvm making pt aware of the rx sent to pharmacy and if no better will need an appointment

## 2016-07-10 NOTE — Telephone Encounter (Signed)
Will forward to pcp

## 2016-09-02 ENCOUNTER — Other Ambulatory Visit: Payer: Self-pay | Admitting: Internal Medicine

## 2016-09-11 ENCOUNTER — Ambulatory Visit: Payer: Medicare Other | Attending: Internal Medicine | Admitting: Internal Medicine

## 2016-09-11 ENCOUNTER — Encounter: Payer: Self-pay | Admitting: Internal Medicine

## 2016-09-11 VITALS — BP 151/99 | HR 85 | Temp 98.2°F | Resp 18 | Wt 182.2 lb

## 2016-09-11 DIAGNOSIS — Z7984 Long term (current) use of oral hypoglycemic drugs: Secondary | ICD-10-CM | POA: Diagnosis not present

## 2016-09-11 DIAGNOSIS — Z794 Long term (current) use of insulin: Secondary | ICD-10-CM | POA: Diagnosis not present

## 2016-09-11 DIAGNOSIS — I159 Secondary hypertension, unspecified: Secondary | ICD-10-CM

## 2016-09-11 DIAGNOSIS — Z720 Tobacco use: Secondary | ICD-10-CM

## 2016-09-11 DIAGNOSIS — E119 Type 2 diabetes mellitus without complications: Secondary | ICD-10-CM

## 2016-09-11 DIAGNOSIS — E7800 Pure hypercholesterolemia, unspecified: Secondary | ICD-10-CM

## 2016-09-11 DIAGNOSIS — Z79899 Other long term (current) drug therapy: Secondary | ICD-10-CM | POA: Diagnosis not present

## 2016-09-11 DIAGNOSIS — Z0001 Encounter for general adult medical examination with abnormal findings: Secondary | ICD-10-CM | POA: Insufficient documentation

## 2016-09-11 DIAGNOSIS — K219 Gastro-esophageal reflux disease without esophagitis: Secondary | ICD-10-CM

## 2016-09-11 DIAGNOSIS — I10 Essential (primary) hypertension: Secondary | ICD-10-CM | POA: Diagnosis not present

## 2016-09-11 DIAGNOSIS — E785 Hyperlipidemia, unspecified: Secondary | ICD-10-CM

## 2016-09-11 LAB — POCT CBG (FASTING - GLUCOSE)-MANUAL ENTRY: GLUCOSE FASTING, POC: 115 mg/dL — AB (ref 70–99)

## 2016-09-11 MED ORDER — METFORMIN HCL ER 500 MG PO TB24: 500.0000 mg | ORAL_TABLET | Freq: Every day | ORAL | 3 refills | Status: DC

## 2016-09-11 MED ORDER — HYDRALAZINE HCL 25 MG PO TABS: 25.0000 mg | ORAL_TABLET | Freq: Three times a day (TID) | ORAL | 2 refills | Status: DC

## 2016-09-11 MED ORDER — AMLODIPINE BESYLATE 10 MG PO TABS: 10.0000 mg | ORAL_TABLET | Freq: Every day | ORAL | 3 refills | Status: DC

## 2016-09-11 MED ORDER — ATORVASTATIN CALCIUM 20 MG PO TABS: 20.0000 mg | ORAL_TABLET | Freq: Every day | ORAL | 3 refills | Status: DC

## 2016-09-11 MED ORDER — PANTOPRAZOLE SODIUM 40 MG PO TBEC: 40.0000 mg | DELAYED_RELEASE_TABLET | Freq: Every day | ORAL | 3 refills | Status: DC

## 2016-09-11 MED ORDER — LISINOPRIL-HYDROCHLOROTHIAZIDE 20-25 MG PO TABS
1.0000 | ORAL_TABLET | Freq: Every day | ORAL | 3 refills | Status: DC
Start: 2016-09-11 — End: 2016-12-04

## 2016-09-11 MED ORDER — ASPIRIN EC 81 MG PO TBEC: 81.0000 mg | DELAYED_RELEASE_TABLET | Freq: Every day | ORAL | 11 refills | Status: DC

## 2016-09-11 NOTE — Progress Notes (Signed)
Henry Walker, is a 55 y.o. male  [id number]  [id number]  DOB - 1960/12/21  No chief complaint on file.       Subjective:   Henry Walker is a 55 y.o. male here today for a follow up visit., last seen  06/03/16 for impaired glucose tolerance, htn and tob abuse.  Doing very well, taking all his meds w/o problems. Watching his diet more, but has occasional lapses w/ salt/fast food.  Down to 1/2 ppd. Denies etoh.  Tried to switch off of ppi, but did not do well w/ other rx, so now back on it.  Patient has No headache, No chest pain, No abdominal pain - No Nausea, No new weakness tingling or numbness, No Cough - SOB.  No problems updated.  ALLERGIES: No Known Allergies  PAST MEDICAL HISTORY: Past Medical History:  Diagnosis Date  . Diabetes mellitus without complication ([place])   . GERD (gastroesophageal reflux disease)   . Hx of adenomatous colonic polyps 10/06/2014  . Hyperlipidemia   . Hypertension     MEDICATIONS AT HOME: Prior to Admission medications   Medication Sig Start Date End Date Taking? Authorizing Provider  amLODipine (NORVASC) 10 MG tablet Take 1 tablet (10 mg total) by mouth daily. 09/11/16  Yes Maren Reamer, MD  atorvastatin (LIPITOR) 20 MG tablet Take 1 tablet (20 mg total) by mouth daily. 09/11/16  Yes Maren Reamer, MD  glucose blood (ACCU-CHEK AVIVA PLUS) test strip Use as instructed 08/04/15  Yes Lance Bosch, NP  glucose blood (ACCU-CHEK AVIVA) test strip Use as instructed 08/04/15  Yes Lance Bosch, NP  hydrALAZINE (APRESOLINE) 25 MG tablet Take 1 tablet (25 mg total) by mouth 3 (three) times daily. 09/11/16  Yes Maren Reamer, MD  Lancet Devices [facility]) lancets Use as instructed 08/04/15  Yes Lance Bosch, NP  metFORMIN (GLUCOPHAGE XR) 500 MG 24 hr tablet Take 1 tablet (500 mg total) by mouth daily with breakfast. 09/11/16  Yes Maren Reamer, MD  pantoprazole (PROTONIX) 40 MG tablet Take 1 tablet (40 mg total)  by mouth daily. 09/11/16  Yes Maren Reamer, MD  aspirin EC 81 MG tablet Take 1 tablet (81 mg total) by mouth daily. 09/11/16   Maren Reamer, MD  azithromycin (ZITHROMAX Z-PAK) 250 MG tablet Take as directed Patient not taking: Reported on 09/11/2016 04/13/15   Lorayne Marek, MD  benzonatate (TESSALON) 100 MG capsule Take 1 capsule (100 mg total) by mouth every 8 (eight) hours. Patient not taking: Reported on 09/11/2016 07/17/15   Carlisle Cater, PA-C  cephALEXin (KEFLEX) 500 MG capsule Take 1 capsule (500 mg total) by mouth 3 (three) times daily. Patient not taking: Reported on 09/11/2016 01/13/15   Lorayne Marek, MD  cyclobenzaprine (FLEXERIL) 5 MG tablet Take 1 tablet (5 mg total) by mouth 3 (three) times daily as needed for muscle spasms. Patient not taking: Reported on 09/11/2016 01/23/16   Gloriann Loan, PA-C  fluticasone [facility]) 50 MCG/ACT nasal spray Place 2 sprays into both nostrils daily. Patient not taking: Reported on 09/11/2016 08/04/15   Lance Bosch, NP  indomethacin (INDOCIN) 25 MG capsule Take 1 capsule (25 mg total) by mouth 3 (three) times daily as needed. Patient not taking: Reported on 09/11/2016 09/06/15   Larene Pickett, PA-C  lisinopril-hydrochlorothiazide (PRINZIDE,ZESTORETIC) 20-25 MG tablet Take 1 tablet by mouth daily. 09/11/16   Maren Reamer, MD  naproxen (NAPROSYN) 500 MG tablet Take 1 tablet (500 mg total)  by mouth 2 (two) times daily. Patient not taking: Reported on 09/11/2016 01/23/16   Gloriann Loan, PA-C  oxyCODONE-acetaminophen (PERCOCET/ROXICET) 5-325 MG tablet Take 1 tablet by mouth every 4 (four) hours as needed. Patient not taking: Reported on 09/11/2016 09/06/15   Larene Pickett, PA-C  sucralfate (CARAFATE) 1 GM/10ML suspension Take 10 mLs (1 g total) by mouth 4 (four) times daily -  with meals and at bedtime. Patient not taking: Reported on 09/11/2016 07/10/16   Maren Reamer, MD     Objective:   Vitals:   09/11/16 1004  BP: (!) 151/99  Pulse:  85  Resp: 18  Temp: 98.2 F (36.8 C)  SpO2: 96%  Weight: 182 lb 3.2 oz (82.6 kg)    Exam General appearance : Awake, alert, not in any distress. Speech Clear. Not toxic looking, pleasant. HEENT: Atraumatic and Normocephalic, pupils equally reactive to light. Neck: supple, no JVD. No cervical lymphadenopathy.  Chest:Good air entry bilaterally, no added sounds. CVS: S1 S2 regular, no murmurs/gallups or rubs. Abdomen: Bowel sounds active, Non tender and not distended with no gaurding, rigidity or rebound. Foot exam: bilateral peripheral pulses 2+ (dorsalis pedis and post tibialis pulses), no ulcers noted/no ecchymosis, warm to touch, monofilament testing 3/3 bilat. Sensation intact.  No c/c/e. Neurology: Awake alert, and oriented X 3, CN II-XII grossly intact, Non focal Skin:No Rash  Data Review Lab Results  Component Value Date   HGBA1C 6.2 06/03/2016   HGBA1C 6.1 01/05/2016   HGBA1C 6.20 08/04/2015    Depression screen [facility] 2/9 09/11/2016 06/03/2016 05/10/2013 04/19/2013  Decreased Interest 0 0 0 0  Down, Depressed, Hopeless 0 0 0 0  PHQ - 2 Score 0 0 0 0      Assessment & Plan   1. Essential hypertension Uncontrolled, goal <130/80 - discussed low salt diet, increase exercise (limited due to his back pains), recd low impact exercise such as swimming /indoor pools - amLODipine (NORVASC) 10 MG tablet; Take 1 tablet (10 mg total) by mouth daily.  Dispense: 90 tablet; Refill: 3 - hydrALAZINE (APRESOLINE) 25 MG tablet; Take 1 tablet (25 mg total) by mouth 3 (three) times daily.  Dispense: 270 tablet; Refill: 2 - increase prinzide to 20-25 qd today  2 Gastroesophageal reflux disease without esophagitis gerd diet discussed, ppi for now, renewed - will attempt to get off again next visit.  3. High cholesterol / hld - ASCVD risk >20% in 10 years - recd asa 81 qd as well for prevetion - atorvastatin (LIPITOR) 20 MG tablet; Take 1 tablet (20 mg total) by mouth daily.  Dispense: 90  tablet; Refill: 3 - aspirin EC 81 MG tablet; Take 1 tablet (81 mg total) by mouth daily.  Dispense: 30 tablet; Refill: 11  4. Type 2 diabetes mellitus without complication, with long-term current use of insulin (HCC)   - prediabetes range, tol metformin - metFORMIN (GLUCOPHAGE XR) 500 MG 24 hr tablet; Take 1 tablet (500 mg total) by mouth daily with breakfast.  Dispense: 90 tablet; Refill: 3 - POCT CBG (Fasting - Glucose)  115 - has optho appt w/ Dr Katy Fitch 10/01/16 - encouraged to keep  5. tob abuse Encouraged complete cessation.     Patient have been counseled extensively about nutrition and exercise  Return in about 3 months (around 12/12/2016).  The patient was given clear instructions to go to ER or return to medical center if symptoms don't improve, worsen or new problems develop. The patient verbalized understanding. The patient was  told to call to get lab results if they haven't heard anything in the next week.   This note has been created with Surveyor, quantity. Any transcriptional errors are unintentional.   Maren Reamer, MD, [place] [facility] [place], [place]   09/11/2016, 12:53 PM

## 2016-09-11 NOTE — Patient Instructions (Addendum)
Steps to Quit Smoking Smoking tobacco can be bad for your health. It can also affect almost every organ in your body. Smoking puts you and people around you at risk for many serious long-lasting (chronic) diseases. Quitting smoking is hard, but it is one of the best things that you can do for your health. It is never too late to quit. What are the benefits of quitting smoking? When you quit smoking, you lower your risk for getting serious diseases and conditions. They can include:  Lung cancer or lung disease.  Heart disease.  Stroke.  Heart attack.  Not being able to have children (infertility).  Weak bones (osteoporosis) and broken bones (fractures). If you have coughing, wheezing, and shortness of breath, those symptoms may get better when you quit. You may also get sick less often. If you are pregnant, quitting smoking can help to lower your chances of having a baby of low birth weight. What can I do to help me quit smoking? Talk with your doctor about what can help you quit smoking. Some things you can do (strategies) include:  Quitting smoking totally, instead of slowly cutting back how much you smoke over a period of time.  Going to in-person counseling. You are more likely to quit if you go to many counseling sessions.  Using resources and support systems, such as:  Online chats with a counselor.  Phone quitlines.  Printed self-help materials.  Support groups or group counseling.  Text messaging programs.  Mobile phone apps or applications.  Taking medicines. Some of these medicines may have nicotine in them. If you are pregnant or breastfeeding, do not take any medicines to quit smoking unless your doctor says it is okay. Talk with your doctor about counseling or other things that can help you. Talk with your doctor about using more than one strategy at the same time, such as taking medicines while you are also going to in-person counseling. This can help make quitting  easier. What things can I do to make it easier to quit? Quitting smoking might feel very hard at first, but there is a lot that you can do to make it easier. Take these steps:  Talk to your family and friends. Ask them to support and encourage you.  Call phone quitlines, reach out to support groups, or work with a counselor.  Ask people who smoke to not smoke around you.  Avoid places that make you want (trigger) to smoke, such as:  Bars.  Parties.  Smoke-break areas at work.  Spend time with people who do not smoke.  Lower the stress in your life. Stress can make you want to smoke. Try these things to help your stress:  Getting regular exercise.  Deep-breathing exercises.  Yoga.  Meditating.  Doing a body scan. To do this, close your eyes, focus on one area of your body at a time from head to toe, and notice which parts of your body are tense. Try to relax the muscles in those areas.  Download or buy apps on your mobile phone or tablet that can help you stick to your quit plan. There are many free apps, such as QuitGuide from the CDC (Centers for Disease Control and Prevention). You can find more support from smokefree.gov and other websites. This information is not intended to replace advice given to you by your health care provider. Make sure you discuss any questions you have with your health care provider. Document Released: 08/10/2009 Document Revised: 06/11/2016 Document   Reviewed: 02/28/2015 Elsevier Interactive Patient Education  2017 [place] Maintenance, Male A healthy lifestyle and preventative care can promote health and wellness.  Maintain regular health, dental, and eye exams.  Eat a healthy diet. Foods like vegetables, fruits, whole grains, low-fat dairy products, and lean protein foods contain the nutrients you need and are low in calories. Decrease your intake of foods high in solid fats, added sugars, and salt. Get information about a  proper diet from your health care provider, if necessary.  Regular physical exercise is one of the most important things you can do for your health. Most adults should get at least 150 minutes of moderate-intensity exercise (any activity that increases your heart rate and causes you to sweat) each week. In addition, most adults need muscle-strengthening exercises on 2 or more days a week.   Maintain a healthy weight. The body mass index (BMI) is a screening tool to identify possible weight problems. It provides an estimate of body fat based on height and weight. Your health care provider can find your BMI and can help you achieve or maintain a healthy weight. For males 20 years and older:  A BMI below 18.5 is considered underweight.  A BMI of 18.5 to 24.9 is normal.  A BMI of 25 to 29.9 is considered overweight.  A BMI of 30 and above is considered obese.  Maintain normal blood lipids and cholesterol by exercising and minimizing your intake of saturated fat. Eat a balanced diet with plenty of fruits and vegetables. Blood tests for lipids and cholesterol should begin at age 63 and be repeated every 5 years. If your lipid or cholesterol levels are high, you are over age 89, or you are at high risk for heart disease, you may need your cholesterol levels checked more frequently.Ongoing high lipid and cholesterol levels should be treated with medicines if diet and exercise are not working.  If you smoke, find out from your health care provider how to quit. If you do not use tobacco, do not start.  Lung cancer screening is recommended for adults aged 16-80 years who are at high risk for developing lung cancer because of a history of smoking. A yearly low-dose CT scan of the lungs is recommended for people who have at least a 30-pack-year history of smoking and are current smokers or have quit within the past 15 years. A pack year of smoking is smoking an average of 1 pack of cigarettes a day for 1 year  (for example, a 30-pack-year history of smoking could mean smoking 1 pack a day for 30 years or 2 packs a day for 15 years). Yearly screening should continue until the smoker has stopped smoking for at least 15 years. Yearly screening should be stopped for people who develop a health problem that would prevent them from having lung cancer treatment.  If you choose to drink alcohol, do not have more than 2 drinks per day. One drink is considered to be 12 oz (360 mL) of beer, 5 oz (150 mL) of wine, or 1.5 oz (45 mL) of liquor.  Avoid the use of street drugs. Do not share needles with anyone. Ask for help if you need support or instructions about stopping the use of drugs.  High blood pressure causes heart disease and increases the risk of stroke. High blood pressure is more likely to develop in:  People who have blood pressure in the end of the normal range (100-139/85-89 mm Hg).  People who are overweight or obese.  People who are African American.  If you are 17-28 years of age, have your blood pressure checked every 3-5 years. If you are 48 years of age or older, have your blood pressure checked every year. You should have your blood pressure measured twice-once when you are at a hospital or clinic, and once when you are not at a hospital or clinic. Record the average of the two measurements. To check your blood pressure when you are not at a hospital or clinic, you can use:  An automated blood pressure machine at a pharmacy.  A home blood pressure monitor.  If you are 7-64 years old, ask your health care provider if you should take aspirin to prevent heart disease.  Diabetes screening involves taking a blood sample to check your fasting blood sugar level. This should be done once every 3 years after age 45 if you are at a normal weight and without risk factors for diabetes. Testing should be considered at a younger age or be carried out more frequently if you are overweight and have at least  1 risk factor for diabetes.  Colorectal cancer can be detected and often prevented. Most routine colorectal cancer screening begins at the age of 84 and continues through age 68. However, your health care provider may recommend screening at an earlier age if you have risk factors for colon cancer. On a yearly basis, your health care provider may provide home test kits to check for hidden blood in the stool. A small camera at the end of a tube may be used to directly examine the colon (sigmoidoscopy or colonoscopy) to detect the earliest forms of colorectal cancer. Talk to your health care provider about this at age 68 when routine screening begins. A direct exam of the colon should be repeated every 5-10 years through age 62, unless early forms of precancerous polyps or small growths are found.  People who are at an increased risk for hepatitis B should be screened for this virus. You are considered at high risk for hepatitis B if:  You were born in a country where hepatitis B occurs often. Talk with your health care provider about which countries are considered high risk.  Your parents were born in a high-risk country and you have not received a shot to protect against hepatitis B (hepatitis B vaccine).  You have HIV or AIDS.  You use needles to inject street drugs.  You live with, or have sex with, someone who has hepatitis B.  You are a man who has sex with other men (MSM).  You get hemodialysis treatment.  You take certain medicines for conditions like cancer, organ transplantation, and autoimmune conditions.  Hepatitis C blood testing is recommended for all people born from [birth year] through 1966 and any individual with known risk factors for hepatitis C.  Healthy men should no longer receive prostate-specific antigen (PSA) blood tests as part of routine cancer screening. Talk to your health care provider about prostate cancer screening.  Testicular cancer screening is not recommended for  adolescents or adult males who have no symptoms. Screening includes self-exam, a health care provider exam, and other screening tests. Consult with your health care provider about any symptoms you have or any concerns you have about testicular cancer.  Practice safe sex. Use condoms and avoid high-risk sexual practices to reduce the spread of sexually transmitted infections (STIs).  You should be screened for STIs, including gonorrhea and chlamydia if:  You are sexually active and are younger than 24 years.  You are older than 24 years, and your health care provider tells you that you are at risk for this type of infection.  Your sexual activity has changed since you were last screened, and you are at an increased risk for chlamydia or gonorrhea. Ask your health care provider if you are at risk.  If you are at risk of being infected with HIV, it is recommended that you take a prescription medicine daily to prevent HIV infection. This is called pre-exposure prophylaxis (PrEP). You are considered at risk if:  You are a man who has sex with other men (MSM).  You are a heterosexual man who is sexually active with multiple partners.  You take drugs by injection.  You are sexually active with a partner who has HIV.  Talk with your health care provider about whether you are at high risk of being infected with HIV. If you choose to begin PrEP, you should first be tested for HIV. You should then be tested every 3 months for as long as you are taking PrEP.  Use sunscreen. Apply sunscreen liberally and repeatedly throughout the day. You should seek shade when your shadow is shorter than you. Protect yourself by wearing long sleeves, pants, a wide-brimmed hat, and sunglasses year round whenever you are outdoors.  Tell your health care provider of new moles or changes in moles, especially if there is a change in shape or color. Also, tell your health care provider if a mole is larger than the size of a  pencil eraser.  A one-time screening for abdominal aortic aneurysm (AAA) and surgical repair of large AAAs by ultrasound is recommended for men aged 65-75 years who are current or former smokers.  Stay current with your vaccines (immunizations). This information is not intended to replace advice given to you by your health care provider. Make sure you discuss any questions you have with your health care provider. Document Released: 04/11/2008 Document Revised: 11/04/2014 Document Reviewed: 07/18/2015 Elsevier Interactive Patient Education  2017 [employer].

## 2016-10-01 ENCOUNTER — Encounter: Payer: Self-pay | Admitting: Internal Medicine

## 2016-10-01 DIAGNOSIS — H2513 Age-related nuclear cataract, bilateral: Secondary | ICD-10-CM | POA: Diagnosis not present

## 2016-10-01 DIAGNOSIS — E119 Type 2 diabetes mellitus without complications: Secondary | ICD-10-CM | POA: Diagnosis not present

## 2016-10-01 LAB — HM DIABETES EYE EXAM

## 2016-10-07 ENCOUNTER — Other Ambulatory Visit: Payer: Self-pay | Admitting: Internal Medicine

## 2016-10-07 DIAGNOSIS — E785 Hyperlipidemia, unspecified: Secondary | ICD-10-CM

## 2016-12-04 ENCOUNTER — Encounter: Payer: Self-pay | Admitting: Internal Medicine

## 2016-12-04 ENCOUNTER — Ambulatory Visit: Payer: Medicare Other | Attending: Internal Medicine | Admitting: Internal Medicine

## 2016-12-04 VITALS — BP 134/79 | HR 75 | Temp 98.2°F | Resp 16 | Wt 178.8 lb

## 2016-12-04 DIAGNOSIS — R3911 Hesitancy of micturition: Secondary | ICD-10-CM

## 2016-12-04 DIAGNOSIS — Z79899 Other long term (current) drug therapy: Secondary | ICD-10-CM | POA: Diagnosis not present

## 2016-12-04 DIAGNOSIS — E7800 Pure hypercholesterolemia, unspecified: Secondary | ICD-10-CM

## 2016-12-04 DIAGNOSIS — R7303 Prediabetes: Secondary | ICD-10-CM

## 2016-12-04 DIAGNOSIS — Z7984 Long term (current) use of oral hypoglycemic drugs: Secondary | ICD-10-CM | POA: Insufficient documentation

## 2016-12-04 DIAGNOSIS — F17210 Nicotine dependence, cigarettes, uncomplicated: Secondary | ICD-10-CM | POA: Diagnosis not present

## 2016-12-04 DIAGNOSIS — E785 Hyperlipidemia, unspecified: Secondary | ICD-10-CM | POA: Diagnosis not present

## 2016-12-04 DIAGNOSIS — Z5189 Encounter for other specified aftercare: Secondary | ICD-10-CM | POA: Insufficient documentation

## 2016-12-04 DIAGNOSIS — N401 Enlarged prostate with lower urinary tract symptoms: Secondary | ICD-10-CM | POA: Diagnosis not present

## 2016-12-04 DIAGNOSIS — I10 Essential (primary) hypertension: Secondary | ICD-10-CM | POA: Diagnosis not present

## 2016-12-04 DIAGNOSIS — Z794 Long term (current) use of insulin: Secondary | ICD-10-CM

## 2016-12-04 DIAGNOSIS — F17200 Nicotine dependence, unspecified, uncomplicated: Secondary | ICD-10-CM

## 2016-12-04 DIAGNOSIS — N529 Male erectile dysfunction, unspecified: Secondary | ICD-10-CM | POA: Diagnosis not present

## 2016-12-04 DIAGNOSIS — E119 Type 2 diabetes mellitus without complications: Secondary | ICD-10-CM

## 2016-12-04 LAB — BASIC METABOLIC PANEL WITH GFR
BUN: 17 mg/dL (ref 7–25)
CHLORIDE: 104 mmol/L (ref 98–110)
CO2: 23 mmol/L (ref 20–31)
CREATININE: 1.53 mg/dL — AB (ref 0.70–1.33)
Calcium: 10.1 mg/dL (ref 8.6–10.3)
GFR, Est African American: 58 mL/min — ABNORMAL LOW (ref >=60)
GFR, Est Non African American: 50 mL/min — ABNORMAL LOW (ref >=60)
Glucose, Bld: 114 mg/dL — ABNORMAL HIGH (ref 65–99)
POTASSIUM: 3.9 mmol/L (ref 3.5–5.3)
Sodium: 140 mmol/L (ref 135–146)

## 2016-12-04 LAB — POCT GLYCOSYLATED HEMOGLOBIN (HGB A1C): HEMOGLOBIN A1C: 6.2

## 2016-12-04 LAB — GLUCOSE, POCT (MANUAL RESULT ENTRY): POC GLUCOSE: 143 mg/dL — AB (ref 70–99)

## 2016-12-04 MED ORDER — ATORVASTATIN CALCIUM 20 MG PO TABS: 20.0000 mg | ORAL_TABLET | Freq: Every day | ORAL | 3 refills | Status: DC

## 2016-12-04 MED ORDER — RANITIDINE HCL 150 MG PO TABS: 150.0000 mg | ORAL_TABLET | Freq: Two times a day (BID) | ORAL | 3 refills | Status: DC

## 2016-12-04 MED ORDER — METFORMIN HCL ER 500 MG PO TB24
500.0000 mg | ORAL_TABLET | Freq: Every day | ORAL | 3 refills | Status: DC
Start: 2016-12-04 — End: 2017-06-03

## 2016-12-04 MED ORDER — LISINOPRIL-HYDROCHLOROTHIAZIDE 20-25 MG PO TABS: 1.0000 | ORAL_TABLET | Freq: Every day | ORAL | 3 refills | Status: DC

## 2016-12-04 MED ORDER — SILDENAFIL CITRATE 50 MG PO TABS: 50.0000 mg | ORAL_TABLET | Freq: Every day | ORAL | 1 refills | Status: DC | PRN

## 2016-12-04 MED ORDER — ASPIRIN EC 81 MG PO TBEC: 81.0000 mg | DELAYED_RELEASE_TABLET | Freq: Every day | ORAL | 11 refills | Status: DC

## 2016-12-04 MED ORDER — TAMSULOSIN HCL 0.4 MG PO CAPS: 0.4000 mg | ORAL_CAPSULE | Freq: Every day | ORAL | 3 refills | Status: DC

## 2016-12-04 MED ORDER — AMLODIPINE BESYLATE 10 MG PO TABS: 10.0000 mg | ORAL_TABLET | Freq: Every day | ORAL | 3 refills | Status: DC

## 2016-12-04 MED ORDER — HYDRALAZINE HCL 25 MG PO TABS: 25.0000 mg | ORAL_TABLET | Freq: Three times a day (TID) | ORAL | 2 refills | Status: DC

## 2016-12-04 MED ORDER — BUPROPION HCL ER (SR) 150 MG PO TB12: 150.0000 mg | ORAL_TABLET | Freq: Two times a day (BID) | ORAL | 2 refills | Status: DC

## 2016-12-04 MED FILL — !VIAGRA 50 MG TABLET: 50 | 30 days supply | Qty: 3 | Fill #0

## 2016-12-04 NOTE — Patient Instructions (Signed)
Food Choices for Gastroesophageal Reflux Disease, Adult When you have gastroesophageal reflux disease (GERD), the foods you eat and your eating habits are very important. Choosing the right foods can help ease your discomfort. What guidelines do I need to follow?  Choose fruits, vegetables, whole grains, and low-fat dairy products.  Choose low-fat meat, fish, and poultry.  Limit fats such as oils, salad dressings, butter, nuts, and avocado.  Keep a food diary. This helps you identify foods that cause symptoms.  Avoid foods that cause symptoms. These may be different for everyone.  Eat small meals often instead of 3 large meals a day.  Eat your meals slowly, in a place where you are relaxed.  Limit fried foods.  Cook foods using methods other than frying.  Avoid drinking alcohol.  Avoid drinking large amounts of liquids with your meals.  Avoid bending over or lying down until 2-3 hours after eating. What foods are not recommended? These are some foods and drinks that may make your symptoms worse: Vegetables  Tomatoes. Tomato juice. Tomato and spaghetti sauce. Chili peppers. Onion and garlic. Horseradish. Fruits  Oranges, grapefruit, and lemon (fruit and juice). Meats  High-fat meats, fish, and poultry. This includes hot dogs, ribs, ham, sausage, salami, and bacon. Dairy  Whole milk and chocolate milk. Sour cream. Cream. Butter. Ice cream. Cream cheese. Drinks  Coffee and tea. Bubbly (carbonated) drinks or energy drinks. Condiments  Hot sauce. Barbecue sauce. Sweets/Desserts  Chocolate and cocoa. Donuts. Peppermint and spearmint. Fats and Oils  High-fat foods. This includes [place] fries and potato chips. Other  Vinegar. Strong spices. This includes black pepper, white pepper, red pepper, cayenne, curry powder, cloves, ginger, and chili powder. The items listed above may not be a complete list of foods and drinks to avoid. Contact your dietitian for more information.    This information is not intended to replace advice given to you by your health care provider. Make sure you discuss any questions you have with your health care provider. Document Released: 04/14/2012 Document Revised: 03/21/2016 Document Reviewed: 08/18/2013 Elsevier Interactive Patient Education  2017 [place] your tobacco abuse: Strongly recommend that he stop smoking back and all other inhaled products right away Call 1-800-Quit-Now to get free nicotine replacement from the state of [place]   -   Steps to Quit Smoking Smoking tobacco can be bad for your health. It can also affect almost every organ in your body. Smoking puts you and people around you at risk for many serious long-lasting (chronic) diseases. Quitting smoking is hard, but it is one of the best things that you can do for your health. It is never too late to quit. What are the benefits of quitting smoking? When you quit smoking, you lower your risk for getting serious diseases and conditions. They can include:  Lung cancer or lung disease.  Heart disease.  Stroke.  Heart attack.  Not being able to have children (infertility).  Weak bones (osteoporosis) and broken bones (fractures). If you have coughing, wheezing, and shortness of breath, those symptoms may get better when you quit. You may also get sick less often. If you are pregnant, quitting smoking can help to lower your chances of having a baby of low birth weight. What can I do to help me quit smoking? Talk with your doctor about what can help you quit smoking. Some things you can do (strategies) include:  Quitting smoking totally, instead of slowly cutting back how much you  smoke over a period of time.  Going to in-person counseling. You are more likely to quit if you go to many counseling sessions.  Using resources and support systems, such as:  Online chats with a Social worker.  Phone quitlines.  Printed Furniture conservator/restorer.  Support  groups or group counseling.  Text messaging programs.  Mobile phone apps or applications.  Taking medicines. Some of these medicines may have nicotine in them. If you are pregnant or breastfeeding, do not take any medicines to quit smoking unless your doctor says it is okay. Talk with your doctor about counseling or other things that can help you. Talk with your doctor about using more than one strategy at the same time, such as taking medicines while you are also going to in-person counseling. This can help make quitting easier. What things can I do to make it easier to quit? Quitting smoking might feel very hard at first, but there is a lot that you can do to make it easier. Take these steps:  Talk to your family and friends. Ask them to support and encourage you.  Call phone quitlines, reach out to support groups, or work with a Social worker.  Ask people who smoke to not smoke around you.  Avoid places that make you want (trigger) to smoke, such as:  Bars.  Parties.  Smoke-break areas at work.  Spend time with people who do not smoke.  Lower the stress in your life. Stress can make you want to smoke. Try these things to help your stress:  Getting regular exercise.  Deep-breathing exercises.  Yoga.  Meditating.  Doing a body scan. To do this, close your eyes, focus on one area of your body at a time from head to toe, and notice which parts of your body are tense. Try to relax the muscles in those areas.  Download or buy apps on your mobile phone or tablet that can help you stick to your quit plan. There are many free apps, such as QuitGuide from the [employer] Office manager for Disease Control and Prevention). You can find more support from smokefree.gov and other websites. This information is not intended to replace advice given to you by your health care provider. Make sure you discuss any questions you have with your health care provider. Document Released: 08/10/2009 Document Revised:  06/11/2016 Document Reviewed: 02/28/2015 Elsevier Interactive Patient Education  2017 [place] DASH stands for "Dietary Approaches to Stop Hypertension." The DASH eating plan is a healthy eating plan that has been shown to reduce high blood pressure (hypertension). Additional health benefits may include reducing the risk of type 2 diabetes mellitus, heart disease, and stroke. The DASH eating plan may also help with weight loss. What do I need to know about the DASH eating plan? For the DASH eating plan, you will follow these general guidelines:  Choose foods with less than 150 milligrams of sodium per serving (as listed on the food label).  Use salt-free seasonings or herbs instead of table salt or sea salt.  Check with your health care provider or pharmacist before using salt substitutes.  Eat lower-sodium products. These are often labeled as "low-sodium" or "no salt added."  Eat fresh foods. Avoid eating a lot of canned foods.  Eat more vegetables, fruits, and low-fat dairy products.  Choose whole grains. Look for the word "whole" as the first word in the ingredient list.  Choose fish and skinless chicken or [place] more often than red meat.  Limit fish, poultry, and meat to 6 oz (170 g) each day.  Limit sweets, desserts, sugars, and sugary drinks.  Choose heart-healthy fats.  Eat more home-cooked food and less restaurant, buffet, and fast food.  Limit fried foods.  Do not fry foods. Cook foods using methods such as baking, boiling, grilling, and broiling instead.  When eating at a restaurant, ask that your food be prepared with less salt, or no salt if possible. What foods can I eat? Seek help from a dietitian for individual calorie needs. Grains  Whole grain or whole wheat bread. Brown rice. Whole grain or whole wheat pasta. Quinoa, bulgur, and whole grain cereals. Low-sodium cereals. Corn or whole wheat flour tortillas. Whole grain cornbread. Whole  grain crackers. Low-sodium crackers. Vegetables  Fresh or frozen vegetables (raw, steamed, roasted, or grilled). Low-sodium or reduced-sodium tomato and vegetable juices. Low-sodium or reduced-sodium tomato sauce and paste. Low-sodium or reduced-sodium canned vegetables. Fruits  All fresh, canned (in natural juice), or frozen fruits. Meat and Other Protein Products  Ground beef (85% or leaner), grass-fed beef, or beef trimmed of fat. Skinless chicken or [place]. Ground chicken or [place]. Pork trimmed of fat. All fish and seafood. Eggs. Dried beans, peas, or lentils. Unsalted nuts and seeds. Unsalted canned beans. Dairy  Low-fat dairy products, such as skim or 1% milk, 2% or reduced-fat cheeses, low-fat ricotta or cottage cheese, or plain low-fat yogurt. Low-sodium or reduced-sodium cheeses. Fats and Oils  Tub margarines without trans fats. Light or reduced-fat mayonnaise and salad dressings (reduced sodium). Avocado. Safflower, olive, or canola oils. Natural peanut or almond butter. Other  Unsalted popcorn and pretzels. The items listed above may not be a complete list of recommended foods or beverages. Contact your dietitian for more options.  What foods are not recommended? Grains  White bread. White pasta. White rice. Refined cornbread. Bagels and croissants. Crackers that contain trans fat. Vegetables  Creamed or fried vegetables. Vegetables in a cheese sauce. Regular canned vegetables. Regular canned tomato sauce and paste. Regular tomato and vegetable juices. Fruits  Canned fruit in light or heavy syrup. Fruit juice. Meat and Other Protein Products  Fatty cuts of meat. Ribs, chicken wings, bacon, sausage, bologna, salami, chitterlings, fatback, hot dogs, bratwurst, and packaged luncheon meats. Salted nuts and seeds. Canned beans with salt. Dairy  Whole or 2% milk, cream, half-and-half, and cream cheese. Whole-fat or sweetened yogurt. Full-fat cheeses or blue cheese. Nondairy creamers and  whipped toppings. Processed cheese, cheese spreads, or cheese curds. Condiments  Onion and garlic salt, seasoned salt, table salt, and sea salt. Canned and packaged gravies. Worcestershire sauce. Tartar sauce. Barbecue sauce. Teriyaki sauce. Soy sauce, including reduced sodium. Steak sauce. Fish sauce. Oyster sauce. Cocktail sauce. Horseradish. Ketchup and mustard. Meat flavorings and tenderizers. Bouillon cubes. Hot sauce. Tabasco sauce. Marinades. Taco seasonings. Relishes. Fats and Oils  Butter, stick margarine, lard, shortening, ghee, and bacon fat. Coconut, palm kernel, or palm oils. Regular salad dressings. Other  Pickles and olives. Salted popcorn and pretzels. The items listed above may not be a complete list of foods and beverages to avoid. Contact your dietitian for more information.  Where can I find more information? National Heart, Lung, and Blood Institute: travelstabloid.com This information is not intended to replace advice given to you by your health care provider. Make sure you discuss any questions you have with your health care provider. Document Released: 10/03/2011 Document Revised: 03/21/2016 Document Reviewed: 08/18/2013 Elsevier Interactive Patient Education  2017 [employer]. - Erectile  Dysfunction Erectile dysfunction is the inability to get or sustain a good enough erection to have sexual intercourse. Erectile dysfunction may involve:  Inability to get an erection.  Lack of enough hardness to allow penetration.  Loss of the erection before sex is finished.  Premature ejaculation. CAUSES  Certain drugs, such as:  Pain relievers.  Antihistamines.  Antidepressants.  Blood pressure medicines.  Water pills (diuretics).  Ulcer medicines.  Muscle relaxants.  Illegal drugs.  Excessive drinking.  Psychological causes, such as:  Anxiety.  Depression.  Sadness.  Exhaustion.  Performance  fear.  Stress.  Physical causes, such as:  Artery problems. This may include diabetes, smoking, liver disease, or atherosclerosis.  High blood pressure.  Hormonal problems, such as low testosterone.  Obesity.  Nerve problems. This may include back or pelvic injuries, diabetes mellitus, multiple sclerosis, or Parkinson disease. SYMPTOMS  Inability to get an erection.  Lack of enough hardness to allow penetration.  Loss of the erection before sex is finished.  Premature ejaculation.  Normal erections at some times, but with frequent unsatisfactory episodes.  Orgasms that are not satisfactory in sensation or frequency.  Low sexual satisfaction in either partner because of erection problems.  A curved penis occurring with erection. The curve may cause pain or may be too curved to allow for intercourse.  Never having nighttime erections. DIAGNOSIS Your caregiver can often diagnose this condition by:  Performing a physical exam to find other diseases or specific problems with the penis.  Asking you detailed questions about the problem.  Performing blood tests to check for diabetes mellitus or to measure hormone levels.  Performing urine tests to find other underlying health conditions.  Performing an ultrasound exam to check for scarring.  Performing a test to check blood flow to the penis.  Doing a sleep study at home to measure nighttime erections. TREATMENT   You may be prescribed medicines by mouth.  You may be given medicine injections into the penis.  You may be prescribed a vacuum pump with a ring.  Penile implant surgery may be performed. You may receive:  An inflatable implant.  A semirigid implant.  Blood vessel surgery may be performed. HOME CARE INSTRUCTIONS  If you are prescribed oral medicine, you should take the medicine as prescribed. Do not increase the dosage without first discussing it with your physician.  If you are using  self-injections, be careful to avoid any veins that are on the surface of the penis. Apply pressure to the injection site for 5 minutes.  If you are using a vacuum pump, make sure you have read the instructions before using it. Discuss any questions with your physician before taking the pump home. SEEK MEDICAL CARE IF:  You experience pain that is not responsive to the pain medicine you have been prescribed.  You experience nausea or vomiting. SEEK IMMEDIATE MEDICAL CARE IF:   When taking oral or injectable medications, you experience an erection that lasts longer than 4 hours. If your physician is unavailable, go to the nearest emergency room for evaluation. An erection that lasts much longer than 4 hours can result in permanent damage to your penis.  You have pain that is severe.  You develop redness, severe pain, or severe swelling of your penis.  You have redness spreading up into your groin or lower abdomen.  You are unable to pass your urine. This information is not intended to replace advice given to you by your health care provider. Make sure you  discuss any questions you have with your health care provider. Document Released: 10/11/2000 Document Revised: 06/16/2013 Document Reviewed: 03/18/2013 Elsevier Interactive Patient Education  2017 Else[employer] Inc.  - Benign Prostatic Hyperplasia An enlarged prostate (benign prostatic hyperplasia) is common in older men. You may experience the following:  Weak urine stream.  Dribbling.  Feeling like the bladder has not emptied completely.  Difficulty starting urination.  Getting up frequently at night to urinate.  Urinating more frequently during the day. HOME CARE INSTRUCTIONS  Monitor your prostatic hyperplasia for any changes. The following actions may help to alleviate any discomfort you are experiencing:  Give yourself time when you urinate.  Stay away from alcohol.  Avoid beverages containing caffeine, such as coffee,  tea, and colas, because they can make the problem worse.  Avoid decongestants, antihistamines, and some prescription medicines that can make the problem worse.  Follow up with your health care provider for further treatment as recommended. SEEK MEDICAL CARE IF:  You are experiencing progressive difficulty voiding.  Your urine stream is progressively getting narrower.  You are awaking from sleep with the urge to void more frequently.  You are constantly feeling the need to void.  You experience loss of urine, especially in small amounts. SEEK IMMEDIATE MEDICAL CARE IF:   You develop increased pain with urination or are unable to urinate.  You develop severe abdominal pain, vomiting, a high fever, or fainting.  You develop back pain or blood in your urine. MAKE SURE YOU:   Understand these instructions.  Will watch your condition.  Will get help right away if you are not doing well or get worse. This information is not intended to replace advice given to you by your health care provider. Make sure you discuss any questions you have with your health care provider. Document Released: 10/14/2005 Document Revised: 11/04/2014 Document Reviewed: 03/16/2013 Elsevier Interactive Patient Education  2017 [employer].

## 2016-12-04 NOTE — Progress Notes (Signed)
Henry Walker, is a 56 y.o. male  [id number]  [id number]  DOB - 15-Mar-1961  Chief Complaint  Patient presents with  . Hypertension  . Diabetes        Subjective:   Henry Walker is a 56 y.o. male here today for a follow up visit for htn, dm, smoking, hld, last seen 11/17. He is overall doing ok, taking all meds as prescribed, and exercising daily. Watching his food as well.    However, c/o  Of ED, able to get erection, but unable to keep it up.  Depressed about this. Still smoking 1/2 ppd.  Acid reflux well controlled on ppi, but amendable to switching rx and watching his gerd diet better.  Patient has No headache, No chest pain, No abdominal pain - No Nausea, No new weakness tingling or numbness, No Cough - SOB.  No problems updated.  ALLERGIES: No Known Allergies  PAST MEDICAL HISTORY: Past Medical History:  Diagnosis Date  . Diabetes mellitus without complication ([place])   . GERD (gastroesophageal reflux disease)   . Hx of adenomatous colonic polyps 10/06/2014  . Hyperlipidemia   . Hypertension     MEDICATIONS AT HOME: Prior to Admission medications   Medication Sig Start Date End Date Taking? Authorizing Provider  amLODipine (NORVASC) 10 MG tablet Take 1 tablet (10 mg total) by mouth daily. 12/04/16   Maren Reamer, MD  aspirin EC 81 MG tablet Take 1 tablet (81 mg total) by mouth daily. 12/04/16   Maren Reamer, MD  atorvastatin (LIPITOR) 20 MG tablet Take 1 tablet (20 mg total) by mouth daily. 12/04/16   Maren Reamer, MD  azithromycin (ZITHROMAX Z-PAK) 250 MG tablet Take as directed Patient not taking: Reported on 09/11/2016 04/13/15   Lorayne Marek, MD  benzonatate (TESSALON) 100 MG capsule Take 1 capsule (100 mg total) by mouth every 8 (eight) hours. Patient not taking: Reported on 09/11/2016 07/17/15   Carlisle Cater, PA-C  buPROPion [facility] SR) 150 MG 12 hr tablet Take 1 tablet (150 mg total) by mouth 2 (two) times daily. 12/04/16   Maren Reamer, MD  cephALEXin (KEFLEX) 500 MG capsule Take 1 capsule (500 mg total) by mouth 3 (three) times daily. Patient not taking: Reported on 09/11/2016 01/13/15   Lorayne Marek, MD  cyclobenzaprine (FLEXERIL) 5 MG tablet Take 1 tablet (5 mg total) by mouth 3 (three) times daily as needed for muscle spasms. Patient not taking: Reported on 09/11/2016 01/23/16   Gloriann Loan, PA-C  fluticasone [facility]) 50 MCG/ACT nasal spray Place 2 sprays into both nostrils daily. Patient not taking: Reported on 09/11/2016 08/04/15   Lance Bosch, NP  glucose blood (ACCU-CHEK AVIVA PLUS) test strip Use as instructed 08/04/15   Lance Bosch, NP  glucose blood (ACCU-CHEK AVIVA) test strip Use as instructed 08/04/15   Lance Bosch, NP  hydrALAZINE (APRESOLINE) 25 MG tablet Take 1 tablet (25 mg total) by mouth 3 (three) times daily. 12/04/16   Maren Reamer, MD  indomethacin (INDOCIN) 25 MG capsule Take 1 capsule (25 mg total) by mouth 3 (three) times daily as needed. Patient not taking: Reported on 09/11/2016 09/06/15   Larene Pickett, PA-C  Lancet Devices (ACCU-CHEK [facility]) lancets Use as instructed 08/04/15   Lance Bosch, NP  lisinopril-hydrochlorothiazide (PRINZIDE,ZESTORETIC) 20-25 MG tablet Take 1 tablet by mouth daily. 12/04/16   Maren Reamer, MD  metFORMIN (GLUCOPHAGE XR) 500 MG 24 hr tablet Take 1 tablet (500 mg total)  by mouth daily with breakfast. 09/11/16   Maren Reamer, MD  naproxen (NAPROSYN) 500 MG tablet Take 1 tablet (500 mg total) by mouth 2 (two) times daily. Patient not taking: Reported on 09/11/2016 01/23/16   Gloriann Loan, PA-C  oxyCODONE-acetaminophen (PERCOCET/ROXICET) 5-325 MG tablet Take 1 tablet by mouth every 4 (four) hours as needed. Patient not taking: Reported on 09/11/2016 09/06/15   Larene Pickett, PA-C  pantoprazole (PROTONIX) 40 MG tablet Take 1 tablet (40 mg total) by mouth daily. 09/11/16   Maren Reamer, MD  ranitidine (ZANTAC) 150 MG tablet Take 1 tablet (150 mg total)  by mouth 2 (two) times daily. 12/04/16   Maren Reamer, MD  sildenafil (VIAGRA) 50 MG tablet Take 1 tablet (50 mg total) by mouth daily as needed for erectile dysfunction. 12/04/16   Maren Reamer, MD  sucralfate (CARAFATE) 1 GM/10ML suspension Take 10 mLs (1 g total) by mouth 4 (four) times daily -  with meals and at bedtime. Patient not taking: Reported on 09/11/2016 07/10/16   Maren Reamer, MD  tamsulosin (FLOMAX) 0.4 MG CAPS capsule Take 1 capsule (0.4 mg total) by mouth daily. 12/04/16   Maren Reamer, MD     Objective:   Vitals:   12/04/16 0856  BP: 134/79  Pulse: 75  Resp: 16  Temp: 98.2 F (36.8 C)  TempSrc: Oral  SpO2: 97%  Weight: 178 lb 12.8 oz (81.1 kg)    Exam General appearance : Awake, alert, not in any distress. Speech Clear. Not toxic looking, pleasant. HEENT: Atraumatic and Normocephalic, pupils equally reactive to light. Neck: supple, no JVD.  Chest:Good air entry bilaterally, no added sounds. CVS: S1 S2 regular, no murmurs/gallups or rubs. Abdomen: Bowel sounds active, Non tender and not distended with no gaurding, rigidity or rebound. Extremities: B/L Lower Ext shows no edema, both legs are warm to touch Neurology: Awake alert, and oriented X 3, CN II-XII grossly intact, Non focal Skin:No Rash  Data Review Lab Results  Component Value Date   HGBA1C 6.2 12/04/2016   HGBA1C 6.2 06/03/2016   HGBA1C 6.1 01/05/2016    Depression screen PHQ 2/9 12/04/2016 09/11/2016 06/03/2016 05/10/2013 04/19/2013  Decreased Interest (No Data) 0 0 0 0  Down, Depressed, Hopeless 2 0 0 0 0  PHQ - 2 Score 2 0 0 0 0  Altered sleeping 0 - - - -  Tired, decreased energy 0 - - - -  Change in appetite 0 - - - -  Feeling bad or failure about yourself  0 - - - -  Trouble concentrating 0 - - - -  Moving slowly or fidgety/restless 0 - - - -  Suicidal thoughts 0 - - - -  PHQ-9 Score 2 - - - -      Assessment & Plan   1. Essential hypertension - amLODipine (NORVASC) 10 MG  tablet; Take 1 tablet (10 mg total) by mouth daily.  Dispense: 90 tablet; Refill: 3 - hydrALAZINE (APRESOLINE) 25 MG tablet; Take 1 tablet (25 mg total) by mouth 3 (three) times daily.  Dispense: 270 tablet; Refill: 2 - BASIC METABOLIC PANEL WITH GFR  2. High cholesterol  / Hyperlipidemia - atorvastatin (LIPITOR) 20 MG tablet; Take 1 tablet (20 mg total) by mouth daily.  Dispense: 90 tablet; Refill: 3 - encouraged to resume asa for cva/cad protection - has not been taking. - aspirin EC 81 MG tablet; Take 1 tablet (81 mg total) by mouth daily.  Dispense:  30 tablet; Refill: 11  4. Prediabetes - doing well, continue metformin qd, low carb diet recd, continue exercising. - POCT glucose (manual entry) - POCT glycosylated hemoglobin (Hb A1C) 6.2  5. Erectile dysfunction, unspecified erectile dysfunction type, suspect due to htn/dm/tob + - Testosterone Total,Free,Bio, Males - trial viagra rx  6. Smoking Total cessation recd, trial welbutrin 150bid. Tips provided   7. Benign prostatic hyperplasia with urinary hesitancy +am hesitancy, recent psa nml. Trial flomax 0.4mg  qd.     Patient have been counseled extensively about nutrition and exercise  Return in about 3 months (around 03/03/2017), or if symptoms worsen or fail to improve.  The patient was given clear instructions to go to ER or return to medical center if symptoms don't improve, worsen or new problems develop. The patient verbalized understanding. The patient was told to call to get lab results if they haven't heard anything in the next week.   This note has been created with Surveyor, quantity. Any transcriptional errors are unintentional.   Maren Reamer, MD, [facility], [place]   12/04/2016, 9:28 AM

## 2016-12-05 LAB — TESTOSTERONE TOTAL,FREE,BIO, MALES
ALBUMIN: 4.2 g/dL (ref 3.6–5.1)
Sex Hormone Binding: 16 nmol/L — ABNORMAL LOW (ref 22–77)
Testosterone, Bioavailable: 121.9 ng/dL (ref 110.0–575.0)
Testosterone, Free: 63.3 pg/mL (ref 46.0–224.0)
Testosterone: 288 ng/dL (ref 250–827)

## 2016-12-09 ENCOUNTER — Ambulatory Visit: Payer: Medicare Other | Attending: Internal Medicine

## 2016-12-09 ENCOUNTER — Other Ambulatory Visit: Payer: Self-pay | Admitting: Internal Medicine

## 2016-12-10 ENCOUNTER — Telehealth: Payer: Self-pay

## 2016-12-10 NOTE — Telephone Encounter (Signed)
-----   Message from Maren Reamer, MD sent at 12/09/2016 10:12 AM EST ----- Testosterone labs normal range. Suspect his ED due to his medical problems. Hopefully the viagra helps.  Kidney function is worse compared to 21months ago, now in ckd 3 range.  Please drink more water and stay hydrated. Your urine should look light, yellow, straw-colored. Will rechk blood work in 60months.  Avoid NSAIDS for now, ie. Motrin, ibuprofen, aleve, naproxen, mobic, etc - which could worsen kidney function. thanks

## 2016-12-10 NOTE — Telephone Encounter (Signed)
CMA call to go over lab results   Patient Did not answer  CMA left a VM stating the reason of the call and to call me back

## 2016-12-11 ENCOUNTER — Other Ambulatory Visit: Payer: Self-pay

## 2016-12-11 MED ORDER — SILDENAFIL CITRATE 50 MG PO TABS: 50.0000 mg | ORAL_TABLET | Freq: Every day | ORAL | 3 refills | Status: DC | PRN

## 2016-12-11 NOTE — Telephone Encounter (Signed)
Patient return CMA call  Patient Verify DOB  Patient inform him about lab results   Patient was aware and understood

## 2017-02-10 MED FILL — $Viagra 50mg tablet: 50 | 30 days supply | Qty: 10 | Fill #1

## 2017-03-13 ENCOUNTER — Encounter: Payer: Self-pay | Admitting: Internal Medicine

## 2017-03-14 ENCOUNTER — Encounter: Payer: Self-pay | Admitting: Internal Medicine

## 2017-03-17 ENCOUNTER — Encounter: Payer: Self-pay | Admitting: Internal Medicine

## 2017-04-03 MED FILL — $Viagra 50mg tablet: 50 | 21 days supply | Qty: 7 | Fill #2

## 2017-05-06 ENCOUNTER — Emergency Department (HOSPITAL_COMMUNITY): Payer: No Typology Code available for payment source

## 2017-05-06 ENCOUNTER — Encounter (HOSPITAL_COMMUNITY): Payer: Self-pay

## 2017-05-06 ENCOUNTER — Emergency Department (HOSPITAL_COMMUNITY)
Admission: EM | Admit: 2017-05-06 | Discharge: 2017-05-06 | Disposition: A | Discharge status: Home / Self Care | Payer: No Typology Code available for payment source | Attending: Emergency Medicine | Admitting: Emergency Medicine

## 2017-05-06 DIAGNOSIS — F17210 Nicotine dependence, cigarettes, uncomplicated: Secondary | ICD-10-CM | POA: Diagnosis not present

## 2017-05-06 DIAGNOSIS — M546 Pain in thoracic spine: Secondary | ICD-10-CM | POA: Diagnosis not present

## 2017-05-06 DIAGNOSIS — I10 Essential (primary) hypertension: Secondary | ICD-10-CM | POA: Diagnosis not present

## 2017-05-06 DIAGNOSIS — E119 Type 2 diabetes mellitus without complications: Secondary | ICD-10-CM | POA: Insufficient documentation

## 2017-05-06 DIAGNOSIS — Z79899 Other long term (current) drug therapy: Secondary | ICD-10-CM | POA: Insufficient documentation

## 2017-05-06 DIAGNOSIS — M545 Low back pain, unspecified: Secondary | ICD-10-CM

## 2017-05-06 DIAGNOSIS — S3992XA Unspecified injury of lower back, initial encounter: Secondary | ICD-10-CM | POA: Diagnosis not present

## 2017-05-06 DIAGNOSIS — Y92481 Parking lot as the place of occurrence of the external cause: Secondary | ICD-10-CM | POA: Diagnosis not present

## 2017-05-06 DIAGNOSIS — Z041 Encounter for examination and observation following transport accident: Secondary | ICD-10-CM | POA: Diagnosis present

## 2017-05-06 MED ORDER — METHOCARBAMOL 500 MG PO TABS: 500.0000 mg | ORAL_TABLET | Freq: Three times a day (TID) | ORAL | 0 refills | Status: DC | PRN

## 2017-05-06 NOTE — Discharge Instructions (Signed)
Read the information below.  Use the prescribed medication as directed.  Please discuss all new medications with your pharmacist.  You may return to the Emergency Department at any time for worsening condition or any new symptoms that concern you.    If you develop fevers, loss of control of bowel or bladder, weakness or numbness in your legs, or are unable to walk, return to the ER for a recheck.  °
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
## 2017-05-12 ENCOUNTER — Other Ambulatory Visit: Payer: Self-pay

## 2017-05-12 NOTE — Patient Outreach (Signed)
Outreach patient to follow up since ED visit.  Patient was not available.  Left a message for a return call.

## 2017-06-03 ENCOUNTER — Telehealth: Payer: Self-pay | Admitting: Internal Medicine

## 2017-06-03 DIAGNOSIS — E119 Type 2 diabetes mellitus without complications: Secondary | ICD-10-CM

## 2017-06-03 DIAGNOSIS — Z794 Long term (current) use of insulin: Principal | ICD-10-CM

## 2017-06-03 MED ORDER — METFORMIN HCL ER 500 MG PO TB24: 500.0000 mg | ORAL_TABLET | Freq: Every day | ORAL | 0 refills | Status: DC

## 2017-06-03 NOTE — Telephone Encounter (Signed)
Pt. Came to facility requesting a refill on Metformin. PT. Has scheduled an appt. For 06/11/17 to establish care.  Pt. Would like medication sent to [facility] on [address]. Please f/u

## 2017-06-03 NOTE — Telephone Encounter (Signed)
Metformin refilled

## 2017-06-11 ENCOUNTER — Encounter: Payer: Self-pay | Admitting: Family Medicine

## 2017-06-11 ENCOUNTER — Ambulatory Visit: Payer: Medicare Other | Attending: Family Medicine | Admitting: Family Medicine

## 2017-06-11 VITALS — BP 120/66 | HR 78 | Temp 97.9°F | Resp 18 | Ht 67.0 in | Wt 174.4 lb

## 2017-06-11 DIAGNOSIS — Z7982 Long term (current) use of aspirin: Secondary | ICD-10-CM | POA: Insufficient documentation

## 2017-06-11 DIAGNOSIS — E785 Hyperlipidemia, unspecified: Secondary | ICD-10-CM | POA: Insufficient documentation

## 2017-06-11 DIAGNOSIS — Z794 Long term (current) use of insulin: Secondary | ICD-10-CM | POA: Insufficient documentation

## 2017-06-11 DIAGNOSIS — N529 Male erectile dysfunction, unspecified: Secondary | ICD-10-CM | POA: Insufficient documentation

## 2017-06-11 DIAGNOSIS — I10 Essential (primary) hypertension: Secondary | ICD-10-CM | POA: Insufficient documentation

## 2017-06-11 DIAGNOSIS — K219 Gastro-esophageal reflux disease without esophagitis: Secondary | ICD-10-CM | POA: Diagnosis not present

## 2017-06-11 DIAGNOSIS — Z79899 Other long term (current) drug therapy: Secondary | ICD-10-CM | POA: Diagnosis not present

## 2017-06-11 DIAGNOSIS — E119 Type 2 diabetes mellitus without complications: Secondary | ICD-10-CM | POA: Insufficient documentation

## 2017-06-11 LAB — GLUCOSE, POCT (MANUAL RESULT ENTRY): POC GLUCOSE: 119 mg/dL — AB (ref 70–99)

## 2017-06-11 LAB — POCT GLYCOSYLATED HEMOGLOBIN (HGB A1C): Hemoglobin A1C: 6.1

## 2017-06-11 MED ORDER — METFORMIN HCL ER 500 MG PO TB24: 500.0000 mg | ORAL_TABLET | Freq: Every day | ORAL | 3 refills | Status: DC

## 2017-06-11 MED ORDER — PANTOPRAZOLE SODIUM 40 MG PO TBEC: 40.0000 mg | DELAYED_RELEASE_TABLET | Freq: Every day | ORAL | 3 refills | Status: DC

## 2017-06-11 MED ORDER — HYDRALAZINE HCL 25 MG PO TABS: 25.0000 mg | ORAL_TABLET | Freq: Three times a day (TID) | ORAL | 3 refills | Status: DC

## 2017-06-11 MED ORDER — AMLODIPINE BESYLATE 10 MG PO TABS: 10.0000 mg | ORAL_TABLET | Freq: Every day | ORAL | 3 refills | Status: DC

## 2017-06-11 MED ORDER — LISINOPRIL-HYDROCHLOROTHIAZIDE 20-25 MG PO TABS: 1.0000 | ORAL_TABLET | Freq: Every day | ORAL | 3 refills | Status: DC

## 2017-06-11 MED ORDER — SILDENAFIL CITRATE 50 MG PO TABS: 50.0000 mg | ORAL_TABLET | Freq: Every day | ORAL | 3 refills | Status: DC | PRN

## 2017-06-11 MED FILL — $Viagra 50mg tablet: 50 | 21 days supply | Qty: 7 | Fill #0

## 2017-06-11 NOTE — Progress Notes (Signed)
Patient is here for f/up  

## 2017-06-11 NOTE — Patient Instructions (Signed)
Type 2 Diabetes Mellitus, Self Care, Adult When you have type 2 diabetes (type 2 diabetes mellitus), you must keep your blood sugar (glucose) under control. You can do this with:  Nutrition.  Exercise.  Lifestyle changes.  Medicines or insulin, if needed.  Support from your doctors and others.  How do I manage my blood sugar?  Check your blood sugar level every day, as often as told.  Call your doctor if your blood sugar is above your goal numbers for 2 tests in a row.  Have your A1c (hemoglobin A1c) level checked at least two times a year. Have it checked more often if your doctor tells you to. Your doctor will set treatment goals for you. Generally, you should have these blood sugar levels:  Before meals (preprandial): 80-130 mg/dL (4.4-7.2 mmol/L).  After meals (postprandial): lower than 180 mg/dL (10 mmol/L).  A1c level: less than 7%.  What do I need to know about high blood sugar? High blood sugar is called hyperglycemia. Know the signs of high blood sugar. Signs may include:  Feeling: ? Thirsty. ? Hungry. ? Very tired.  Needing to pee (urinate) more than usual.  Blurry vision.  What do I need to know about low blood sugar? Low blood sugar is called hypoglycemia. This is when blood sugar is at or below 70 mg/dL (3.9 mmol/L). Symptoms may include:  Feeling: ? Hungry. ? Worried or nervous (anxious). ? Sweaty and clammy. ? Confused. ? Dizzy. ? Sleepy. ? Sick to your stomach (nauseous).  Having: ? A fast heartbeat (palpitations). ? A headache. ? A change in your vision. ? Jerky movements that you cannot control (seizure). ? Nightmares. ? Tingling or no feeling (numbness) around the mouth, lips, or tongue.  Having trouble with: ? Talking. ? Paying attention (concentrating). ? Moving (coordination). ? Sleeping.  Shaking.  Passing out (fainting).  Getting upset easily (irritability).  Treating low blood sugar  To treat low blood sugar, eat or  drink something sugary right away. If you can think clearly and swallow safely, follow the 15:15 rule:  Take 15 grams of a fast-acting carb (carbohydrate). Some fast-acting carbs are: ? 1 tube of glucose gel. ? 3 sugar tablets (glucose pills). ? 6-8 pieces of hard candy. ? 4 oz (120 mL) of fruit juice. ? 4 oz (120 mL) regular (not diet) soda.  Check your blood sugar 15 minutes after you take the carb.  If your blood sugar is still at or below 70 mg/dL (3.9 mmol/L), take 15 grams of a carb again.  If your blood sugar does not go above 70 mg/dL (3.9 mmol/L) after 3 tries, get help right away.  After your blood sugar goes back to normal, eat a meal or a snack within 1 hour.  Treating very low blood sugar If your blood sugar is at or below 54 mg/dL (3 mmol/L), you have very low blood sugar (severe hypoglycemia). This is an emergency. Do not wait to see if the symptoms will go away. Get medical help right away. Call your local emergency services (911 in the [place] Do not drive yourself to the hospital. If you have very low blood sugar and you cannot eat or drink, you may need a glucagon shot (injection). A family member or friend should learn how to check your blood sugar and how to give you a glucagon shot. Ask your doctor if you need to have a glucagon shot kit at home. What else is important to manage my diabetes? Medicine  Follow these instructions about insulin and diabetes medicines:  Take them as told by your doctor.  Adjust them as told by your doctor.  Do not run out of them.  Having diabetes can raise your risk for other long-term conditions. These include heart or kidney disease. Your doctor may prescribe medicines to help prevent problems from diabetes. Food   Make healthy food choices. These include: ? Chicken, fish, egg whites, and beans. ? Oats, whole wheat, bulgur, brown rice, quinoa, and millet. ? Fresh fruits and vegetables. ? Low-fat dairy products. ? Nuts,  avocado, olive oil, and canola oil.  Make a food plan with a specialist (dietitian).  Follow instructions from your doctor about what you cannot eat or drink.  Drink enough fluid to keep your pee (urine) clear or pale yellow.  Eat healthy snacks between healthy meals.  Keep track of carbs that you eat. Read food labels. Learn food serving sizes.  Follow your sick day plan when you cannot eat or drink normally. Make this plan with your doctor so it is ready to use. Activity  Exercise at least 3 times a week.  Do not go more than 2 days without exercising.  Talk with your doctor before you start a new exercise. Your doctor may need to adjust your insulin, medicines, or food. Lifestyle   Do not use any tobacco products. These include cigarettes, chewing tobacco, and e-cigarettes.If you need help quitting, ask your doctor.  Ask your doctor how much alcohol is safe for you.  Learn to deal with stress. If you need help with this, ask your doctor. Body care  Stay up to date with your shots (immunizations).  Have your eyes and feet checked by a doctor as often as told.  Check your skin and feet every day. Check for cuts, bruises, redness, blisters, or sores.  Brush your teeth and gums two times a day.  Floss at least one time a day.  Go to the dentist least one time every 6 months.  Stay at a healthy weight. General instructions   Take over-the-counter and prescription medicines only as told by your doctor.  Share your diabetes care plan with: ? Your work or school. ? People you live with.  Check your pee (urine) for ketones: ? When you are sick. ? As told by your doctor.  Carry a card or wear jewelry that says that you have diabetes.  Ask your doctor: ? Do I need to meet with a diabetes educator? ? Where can I find a support group for people with diabetes?  Keep all follow-up visits as told by your doctor. This is important. Where to find more information: To  learn more about diabetes, visit:  American Diabetes Association: www.diabetes.org  American Association of Diabetes Educators: www.diabeteseducator.org/patient-resources  This information is not intended to replace advice given to you by your health care provider. Make sure you discuss any questions you have with your health care provider. Document Released: 02/05/2016 Document Revised: 03/21/2016 Document Reviewed: 11/17/2015 Elsevier Interactive Patient Education  [employer].

## 2017-06-11 NOTE — Progress Notes (Signed)
Subjective:  Patient ID: Henry Walker, male    DOB: [birth year]  Age: 56 y.o. MRN: [id number]  CC: Follow-up   HPI [facility] presents for follow up. History of HTN, DM, HLD,GERD, and ED. History of DM. Symptoms: none. Patient denies foot ulcerations, nausea, paresthesia of the feet, polydipsia, polyuria, visual disturbances and vomitting.  Evaluation to date has been included: fasting lipid panel and hemoglobin A1C.  Home sugars: patient does not check sugars. Treatment to date: metformin. History of HTN.Marland Kitchen He is not exercising and is adherent to low salt diet.  Blood pressure is well controlled at home. Cardiac symptoms none. Patient denies chest pain, chest pressure/discomfort, claudication, dyspnea, lower extremity edema, orthopnea, palpitations and syncope.  Cardiovascular risk factors: advanced age (older than 17 for men, 98 for women), diabetes mellitus, dyslipidemia, hypertension, male gender and sedentary lifestyle. Use of agents associated with hypertension: none. History of target organ damage: none.History of GERD. Associated symptoms include heartburn, reflux, and abdominal cramping.  He denies chest pain, dysphagia, melena and midespigastric pain. Medications in the past has included proton pump inhibitors. History of ED. He denies use of nitrates. Reports improvement of symptoms with sildenafil.   Outpatient Medications Prior to Visit  Medication Sig Dispense Refill  . aspirin EC 81 MG tablet Take 1 tablet (81 mg total) by mouth daily. 30 tablet 11  . atorvastatin (LIPITOR) 20 MG tablet Take 1 tablet (20 mg total) by mouth daily. 90 tablet 3  . buPROPion (WELLBUTRIN SR) 150 MG 12 hr tablet Take 1 tablet (150 mg total) by mouth 2 (two) times daily. 60 tablet 2  . glucose blood (ACCU-CHEK AVIVA PLUS) test strip Use as instructed 100 each 12  . glucose blood (ACCU-CHEK AVIVA) test strip Use as instructed 100 each 12  . Lancet Devices (ACCU-CHEK SOFTCLIX) lancets Use  as instructed 1 each 0  . methocarbamol (ROBAXIN) 500 MG tablet Take 1-2 tablets (500-1,000 mg total) by mouth every 8 (eight) hours as needed for muscle spasms (low back pain). 15 tablet 0  . tamsulosin (FLOMAX) 0.4 MG CAPS capsule Take 1 capsule (0.4 mg total) by mouth daily. 30 capsule 3  . amLODipine (NORVASC) 10 MG tablet Take 1 tablet (10 mg total) by mouth daily. 90 tablet 3  . fluticasone (FLONASE) 50 MCG/ACT nasal spray Place 2 sprays into both nostrils daily. (Patient not taking: Reported on 09/11/2016) 16 g 2  . hydrALAZINE (APRESOLINE) 25 MG tablet Take 1 tablet (25 mg total) by mouth 3 (three) times daily. 270 tablet 2  . lisinopril-hydrochlorothiazide (PRINZIDE,ZESTORETIC) 20-25 MG tablet Take 1 tablet by mouth daily. 90 tablet 3  . metFORMIN (GLUCOPHAGE XR) 500 MG 24 hr tablet Take 1 tablet (500 mg total) by mouth daily with breakfast. 30 tablet 0  . pantoprazole (PROTONIX) 40 MG tablet Take 1 tablet (40 mg total) by mouth daily. 90 tablet 3  . ranitidine (ZANTAC) 150 MG tablet Take 1 tablet (150 mg total) by mouth 2 (two) times daily. 60 tablet 3  . sildenafil (VIAGRA) 50 MG tablet Take 1 tablet (50 mg total) by mouth daily as needed for erectile dysfunction. 30 tablet 3  . sucralfate (CARAFATE) 1 GM/10ML suspension Take 10 mLs (1 g total) by mouth 4 (four) times daily -  with meals and at bedtime. (Patient not taking: Reported on 09/11/2016) 420 mL 0   Facility-Administered Medications Prior to Visit  Medication Dose Route Frequency Provider Last Rate Last Dose  . carbamide peroxide (DEBROX) 6.5 %  otic solution 5 drop  5 drop Left EAR Once Langeland, Leda Quail, MD        ROS Review of Systems  Constitutional: Negative.   Eyes: Negative.   Respiratory: Negative.   Cardiovascular: Negative.   Gastrointestinal: Negative.   Endocrine: Negative.   Musculoskeletal: Negative.   Skin: Negative.   Neurological: Negative.    Objective:  BP 120/66 (BP Location: Left Arm, Patient  Position: Sitting, Cuff Size: Normal)   Pulse 78   Temp 97.9 F (36.6 C) (Oral)   Resp 18   Ht 5\' 7"  (1.702 m)   Wt 174 lb 6.4 oz (79.1 kg)   SpO2 96%   BMI 27.31 kg/m   BP/Weight 06/17/2017 06/16/2017 0/34/7425  Systolic BP 956 - 387  Diastolic BP 85 - 66  Wt. (Lbs) - 175 174.4  BMI 27.41 - 27.31   Physical Exam  Constitutional: He is oriented to person, place, and time. He appears well-developed and well-nourished.  Eyes: Pupils are equal, round, and reactive to light. Conjunctivae are normal.  Neck: Normal range of motion. Neck supple.  Cardiovascular: Normal rate, regular rhythm, normal heart sounds and intact distal pulses.   Pulmonary/Chest: Effort normal and breath sounds normal.  Abdominal: Soft. Bowel sounds are normal.  Neurological: He is alert and oriented to person, place, and time. No sensory deficit.  Skin: Skin is warm and dry.  Psychiatric: He has a normal mood and affect.  Nursing note and vitals reviewed.  Assessment & Plan:   Problem List Items Addressed This Visit      Cardiovascular and Mediastinum   HTN (hypertension) - Primary (Chronic)   Relevant Medications   hydrALAZINE (APRESOLINE) 25 MG tablet   lisinopril-hydrochlorothiazide (PRINZIDE,ZESTORETIC) 20-25 MG tablet   amLODipine (NORVASC) 10 MG tablet   sildenafil (VIAGRA) 50 MG tablet   Other Relevant Orders   Basic metabolic panel (Completed)   Lipid Panel (Completed)     Digestive   GERD (gastroesophageal reflux disease)   Relevant Medications   pantoprazole (PROTONIX) 40 MG tablet     Endocrine   Diabetes mellitus without complication (HCC)   Relevant Medications   metFORMIN (GLUCOPHAGE XR) 500 MG 24 hr tablet   lisinopril-hydrochlorothiazide (PRINZIDE,ZESTORETIC) 20-25 MG tablet   Other Relevant Orders   Glucose (CBG) (Completed)   HgB A1c (Completed)    Other Visit Diagnoses    Dyslipidemia       Relevant Orders   Lipid Panel (Completed)   Type 2 diabetes mellitus without  complication, with long-term current use of insulin (HCC)       Relevant Medications   metFORMIN (GLUCOPHAGE XR) 500 MG 24 hr tablet   lisinopril-hydrochlorothiazide (PRINZIDE,ZESTORETIC) 20-25 MG tablet   Erectile dysfunction, unspecified erectile dysfunction type       Relevant Medications   sildenafil (VIAGRA) 50 MG tablet      Meds ordered this encounter  Medications  . DISCONTD: amLODipine (NORVASC) 10 MG tablet    Sig: Take 1 tablet (10 mg total) by mouth daily.    Dispense:  90 tablet    Refill:  3    Must have office visit for refills.    Order Specific Question:   Supervising Provider    Answer:   Tresa Garter [id number]  . DISCONTD: metFORMIN (GLUCOPHAGE XR) 500 MG 24 hr tablet    Sig: Take 1 tablet (500 mg total) by mouth daily with breakfast.    Dispense:  90 tablet    Refill:  3  Must have office visit for refills    Order Specific Question:   Supervising Provider    Answer:   Tresa Garter [[id number]]  . hydrALAZINE (APRESOLINE) 25 MG tablet    Sig: Take 1 tablet (25 mg total) by mouth 3 (three) times daily.    Dispense:  270 tablet    Refill:  3    Must have office visit for refills.    Order Specific Question:   Supervising Provider    Answer:   Tresa Garter [id number]  . DISCONTD: lisinopril-hydrochlorothiazide (PRINZIDE,ZESTORETIC) 20-25 MG tablet    Sig: Take 1 tablet by mouth daily.    Dispense:  90 tablet    Refill:  3    Must have office visit for refills.    Order Specific Question:   Supervising Provider    Answer:   Tresa Garter [id number]  . metFORMIN (GLUCOPHAGE XR) 500 MG 24 hr tablet    Sig: Take 1 tablet (500 mg total) by mouth daily with breakfast.    Dispense:  90 tablet    Refill:  3    Must have office visit for refills    Order Specific Question:   Supervising Provider    Answer:   Tresa Garter [id number]  . pantoprazole (PROTONIX) 40 MG tablet    Sig: Take 1 tablet (40 mg total) by mouth daily.     Dispense:  90 tablet    Refill:  3    Order Specific Question:   Supervising Provider    Answer:   Tresa Garter [id number]  . lisinopril-hydrochlorothiazide (PRINZIDE,ZESTORETIC) 20-25 MG tablet    Sig: Take 1 tablet by mouth daily.    Dispense:  90 tablet    Refill:  3    Must have office visit for refills.    Order Specific Question:   Supervising Provider    Answer:   Tresa Garter [id number]  . amLODipine (NORVASC) 10 MG tablet    Sig: Take 1 tablet (10 mg total) by mouth daily.    Dispense:  90 tablet    Refill:  3    Must have office visit for refills.    Order Specific Question:   Supervising Provider    Answer:   Tresa Garter [id number]  . sildenafil (VIAGRA) 50 MG tablet    Sig: Take 1 tablet (50 mg total) by mouth daily as needed for erectile dysfunction.    Dispense:  30 tablet    Refill:  3    Order Specific Question:   Supervising Provider    Answer:   Tresa Garter [id number]    Follow-up: Return in about 3 months (around 09/11/2017) for HTN/DM/HLD.   Alfonse Spruce FNP

## 2017-06-12 LAB — BASIC METABOLIC PANEL
BUN / CREAT RATIO: 10 (ref 9–20)
BUN: 13 mg/dL (ref 6–24)
CO2: 21 mmol/L (ref 20–29)
CREATININE: 1.24 mg/dL (ref 0.76–1.27)
Calcium: 9.9 mg/dL (ref 8.7–10.2)
Chloride: 102 mmol/L (ref 96–106)
GFR calc Af Amer: 74 mL/min/{1.73_m2} (ref >59)
GFR, EST NON AFRICAN AMERICAN: 64 mL/min/{1.73_m2} (ref >59)
Glucose: 101 mg/dL — ABNORMAL HIGH (ref 65–99)
Potassium: 4 mmol/L (ref 3.5–5.2)
SODIUM: 140 mmol/L (ref 134–144)

## 2017-06-12 LAB — LIPID PANEL
Chol/HDL Ratio: 8.2 ratio — ABNORMAL HIGH (ref 0.0–5.0)
Cholesterol, Total: 262 mg/dL — ABNORMAL HIGH (ref 100–199)
HDL: 32 mg/dL — ABNORMAL LOW (ref >39)
LDL CALC: 192 mg/dL — AB (ref 0–99)
Triglycerides: 188 mg/dL — ABNORMAL HIGH (ref 0–149)
VLDL Cholesterol Cal: 38 mg/dL (ref 5–40)

## 2017-06-16 ENCOUNTER — Emergency Department (HOSPITAL_COMMUNITY): Payer: Medicare Other

## 2017-06-16 ENCOUNTER — Encounter (HOSPITAL_COMMUNITY): Payer: Self-pay | Admitting: Emergency Medicine

## 2017-06-16 DIAGNOSIS — K219 Gastro-esophageal reflux disease without esophagitis: Secondary | ICD-10-CM | POA: Diagnosis not present

## 2017-06-16 DIAGNOSIS — R0789 Other chest pain: Secondary | ICD-10-CM | POA: Insufficient documentation

## 2017-06-16 DIAGNOSIS — Z7982 Long term (current) use of aspirin: Secondary | ICD-10-CM | POA: Diagnosis not present

## 2017-06-16 DIAGNOSIS — R51 Headache: Secondary | ICD-10-CM | POA: Insufficient documentation

## 2017-06-16 DIAGNOSIS — I10 Essential (primary) hypertension: Secondary | ICD-10-CM | POA: Diagnosis not present

## 2017-06-16 DIAGNOSIS — Z79899 Other long term (current) drug therapy: Secondary | ICD-10-CM | POA: Diagnosis not present

## 2017-06-16 DIAGNOSIS — F17210 Nicotine dependence, cigarettes, uncomplicated: Secondary | ICD-10-CM | POA: Diagnosis not present

## 2017-06-16 DIAGNOSIS — E119 Type 2 diabetes mellitus without complications: Secondary | ICD-10-CM | POA: Insufficient documentation

## 2017-06-16 DIAGNOSIS — Z7984 Long term (current) use of oral hypoglycemic drugs: Secondary | ICD-10-CM | POA: Insufficient documentation

## 2017-06-16 DIAGNOSIS — R079 Chest pain, unspecified: Secondary | ICD-10-CM | POA: Diagnosis not present

## 2017-06-16 LAB — I-STAT TROPONIN, ED
TROPONIN I, POC: 0 ng/mL (ref 0.00–0.08)
Troponin i, poc: 0 ng/mL (ref 0.00–0.08)

## 2017-06-16 LAB — BASIC METABOLIC PANEL
Anion gap: 14 (ref 5–15)
BUN: 24 mg/dL — AB (ref 6–20)
CALCIUM: 9.8 mg/dL (ref 8.9–10.3)
CHLORIDE: 100 mmol/L — AB (ref 101–111)
CO2: 21 mmol/L — ABNORMAL LOW (ref 22–32)
CREATININE: 1.28 mg/dL — AB (ref 0.61–1.24)
GFR calc non Af Amer: >60 mL/min (ref >60)
GLUCOSE: 78 mg/dL (ref 65–99)
Potassium: 3.4 mmol/L — ABNORMAL LOW (ref 3.5–5.1)
Sodium: 135 mmol/L (ref 135–145)

## 2017-06-16 LAB — CBC
HCT: 40.9 % (ref 39.0–52.0)
Hemoglobin: 14.4 g/dL (ref 13.0–17.0)
MCH: 28.5 pg (ref 26.0–34.0)
MCHC: 35.2 g/dL (ref 30.0–36.0)
MCV: 80.8 fL (ref 78.0–100.0)
PLATELETS: 287 10*3/uL (ref 150–400)
RBC: 5.06 MIL/uL (ref 4.22–5.81)
RDW: 13.7 % (ref 11.5–15.5)
WBC: 10.1 10*3/uL (ref 4.0–10.5)

## 2017-06-16 MED ORDER — IBUPROFEN 400 MG PO TABS
ORAL_TABLET | ORAL | Status: AC
  Filled 2017-06-16: qty 1

## 2017-06-16 MED ORDER — IBUPROFEN 400 MG PO TABS
400.0000 mg | ORAL_TABLET | Freq: Once | ORAL | Status: AC | PRN
  Administered 2017-06-16: 400 mg via ORAL

## 2017-06-16 NOTE — ED Triage Notes (Signed)
Pt reports HA, CP present since Friday. Reports L sided no radiation, aching. Also reports a fever at home, afebrile in triage.

## 2017-06-17 ENCOUNTER — Emergency Department (HOSPITAL_COMMUNITY)
Admission: EM | Admit: 2017-06-17 | Discharge: 2017-06-17 | Disposition: A | Discharge status: Home / Self Care | Payer: Medicare Other | Attending: Emergency Medicine | Admitting: Emergency Medicine

## 2017-06-17 DIAGNOSIS — K219 Gastro-esophageal reflux disease without esophagitis: Secondary | ICD-10-CM

## 2017-06-17 DIAGNOSIS — R0789 Other chest pain: Secondary | ICD-10-CM

## 2017-06-17 LAB — HEPATIC FUNCTION PANEL
ALT: 49 U/L (ref 17–63)
AST: 35 U/L (ref 15–41)
Albumin: 4 g/dL (ref 3.5–5.0)
Alkaline Phosphatase: 50 U/L (ref 38–126)
Total Bilirubin: 0.6 mg/dL (ref 0.3–1.2)
Total Protein: 7.6 g/dL (ref 6.5–8.1)

## 2017-06-17 LAB — LIPASE, BLOOD: Lipase: 47 U/L (ref 11–51)

## 2017-06-17 MED ORDER — GI COCKTAIL ~~LOC~~
30.0000 mL | Freq: Once | ORAL | Status: AC
  Administered 2017-06-17: 30 mL via ORAL
  Filled 2017-06-17: qty 30

## 2017-06-17 MED ORDER — ACETAMINOPHEN 500 MG PO TABS
1000.0000 mg | ORAL_TABLET | Freq: Once | ORAL | Status: AC
  Administered 2017-06-17: 1000 mg via ORAL
  Filled 2017-06-17: qty 2

## 2017-06-17 MED ORDER — PANTOPRAZOLE SODIUM 40 MG PO TBEC: 40.0000 mg | DELAYED_RELEASE_TABLET | Freq: Every day | ORAL | 1 refills | Status: DC

## 2017-06-17 MED ORDER — ONDANSETRON 4 MG PO TBDP: 4.0000 mg | ORAL_TABLET | Freq: Three times a day (TID) | ORAL | 0 refills | Status: DC | PRN

## 2017-06-17 MED ORDER — SUCRALFATE 1 GM/10ML PO SUSP: 1.0000 g | Freq: Three times a day (TID) | ORAL | 0 refills | Status: DC

## 2017-06-17 NOTE — ED Provider Notes (Signed)
TIME SEEN: 1:49 AM  By signing my name below, I, Eunice Blase, attest that this documentation has been prepared under the direction and in the presence of Adel Burch, Delice Bison, DO. Electronically signed, Eunice Blase, ED Scribe. 06/17/17. 2:00 AM.   CHIEF COMPLAINT: Chest pain  HPI:  Henry Walker is a 56 y.o. male with history of hypertension, diabetes, hyperlipidemia presenting to the Emergency Department concerning worsening L sided chest pain x ~3 days. Patient describes the chest pain is intermittent and feels like it is hard to breathe. Pain is sharp in nature and worse when he lies on his left side and palpate this area. He also reports he has had a "slight" headache that started on Friday but has improved. Also had diarrhea on Friday and Saturday has resolved. Reports subjective fevers also on Friday and Saturday. Has had some upper abdominal soreness. Denies nausea or vomiting. Patient denies history of CAD. His last stress test was years ago. No history of cardiac catheterization.  No history of PE, DVT, exogenous estrogen use, recent fractures, surgery, trauma, hospitalization or prolonged travel. No lower extremity swelling or pain. No calf tenderness.  ROS: See HPI Constitutional: subjective fever  Eyes: no drainage  ENT: no runny nose   Cardiovascular: positive chest pain Resp: positive SOB GI: no vomiting GU: no dysuria Integumentary: no rash  Allergy: no hives  Musculoskeletal: no leg swelling  Neurological: no slurred speech ROS otherwise negative  PAST MEDICAL HISTORY/PAST SURGICAL HISTORY:  Past Medical History:  Diagnosis Date  . Diabetes mellitus without complication ([place])   . GERD (gastroesophageal reflux disease)   . Hx of adenomatous colonic polyps 10/06/2014  . Hyperlipidemia   . Hypertension     MEDICATIONS:  Prior to Admission medications   Medication Sig Start Date End Date Taking? Authorizing Provider  amLODipine (NORVASC) 10 MG tablet Take 1  tablet (10 mg total) by mouth daily. 06/11/17   Alfonse Spruce, FNP  aspirin EC 81 MG tablet Take 1 tablet (81 mg total) by mouth daily. 12/04/16   Maren Reamer, MD  atorvastatin (LIPITOR) 20 MG tablet Take 1 tablet (20 mg total) by mouth daily. 12/04/16   Maren Reamer, MD  buPROPion (WELLBUTRIN SR) 150 MG 12 hr tablet Take 1 tablet (150 mg total) by mouth 2 (two) times daily. 12/04/16   Lottie Mussel T, MD  glucose blood (ACCU-CHEK AVIVA PLUS) test strip Use as instructed 08/04/15   Chari Manning A, NP  glucose blood (ACCU-CHEK AVIVA) test strip Use as instructed 08/04/15   Lance Bosch, NP  hydrALAZINE (APRESOLINE) 25 MG tablet Take 1 tablet (25 mg total) by mouth 3 (three) times daily. 06/11/17   Alfonse Spruce, FNP  Lancet Devices [facility]) lancets Use as instructed 08/04/15   Lance Bosch, NP  lisinopril-hydrochlorothiazide (PRINZIDE,ZESTORETIC) 20-25 MG tablet Take 1 tablet by mouth daily. 06/11/17   Alfonse Spruce, FNP  metFORMIN (GLUCOPHAGE XR) 500 MG 24 hr tablet Take 1 tablet (500 mg total) by mouth daily with breakfast. 06/11/17   Alfonse Spruce, FNP  methocarbamol (ROBAXIN) 500 MG tablet Take 1-2 tablets (500-1,000 mg total) by mouth every 8 (eight) hours as needed for muscle spasms (low back pain). 05/06/17   Clayton Bibles, PA-C  pantoprazole (PROTONIX) 40 MG tablet Take 1 tablet (40 mg total) by mouth daily. 06/11/17   Alfonse Spruce, FNP  sildenafil (VIAGRA) 50 MG tablet Take 1 tablet (50 mg total) by mouth daily as needed  for erectile dysfunction. 06/11/17   Alfonse Spruce, FNP  tamsulosin (FLOMAX) 0.4 MG CAPS capsule Take 1 capsule (0.4 mg total) by mouth daily. 12/04/16   Maren Reamer, MD    ALLERGIES:  No Known Allergies  SOCIAL HISTORY:  Social History  Substance Use Topics  . Smoking status: Current Every Day Smoker    Packs/day: 0.50    Types: Cigarettes  . Smokeless tobacco: Never Used  . Alcohol use No    FAMILY  HISTORY: Family History  Problem Relation Age of Onset  . Colon cancer Neg Hx   . Esophageal cancer Neg Hx   . Rectal cancer Neg Hx   . Stomach cancer Neg Hx     EXAM: BP 121/79 (BP Location: Right Arm)   Pulse 70   Temp 98.2 F (36.8 C) (Oral)   Resp 16   Ht 5\' 7"  (1.702 m)   Wt 175 lb (79.4 kg)   SpO2 96%   BMI 27.41 kg/m  CONSTITUTIONAL: Alert and oriented and responds appropriately to questions. Well-appearing; well-nourished HEAD: Normocephalic EYES: Conjunctivae clear, pupils appear equal, EOMI ENT: normal nose; moist mucous membranes NECK: Supple, no meningismus, no nuchal rigidity, no LAD  CARD: RRR; S1 and S2 appreciated; no murmurs, no clicks, no rubs, no gallops CHEST:  Chest wall is mildly tender to palpation over the anterior left chest which reproduces his pain.  No crepitus, ecchymosis, erythema, warmth, rash or other lesions present.   RESP: Normal chest excursion without splinting or tachypnea; breath sounds clear and equal bilaterally; no wheezes, no rhonchi, no rales, no hypoxia or respiratory distress, speaking full sentences ABD/GI: Normal bowel sounds; non-distended; soft, mildly tender in the epigastric region, negative Murphy sign, no rebound, no guarding, no peritoneal signs, no hepatosplenomegaly BACK:  The back appears normal and is non-tender to palpation, there is no CVA tenderness EXT: Normal ROM in all joints; non-tender to palpation; no edema; normal capillary refill; no cyanosis, no calf tenderness or swelling    SKIN: Normal color for age and race; warm; no rash NEURO: Moves all extremities equally PSYCH: The patient's mood and manner are appropriate. Grooming and personal hygiene are appropriate.  MEDICAL DECISION MAKING: Patient here with atypical chest pain. Suspect musculoskeletal versus GERD, gastritis/esophagitis. Troponin 2 is negative. Chest x-ray shows chronic bronchitic changes from smoking. His lungs are clear at this time. We'll add on  LFTs, lipase. We'll give Tylenol for his mild headache. We'll give GI cocktail and reassess.  ED PROGRESS: LFTs and lipase normal. Patient reports chest pain, headache completely resolved. He states he is feeling "much better" and is very thankful for care. He is comfortable with plan for discharge home. We'll discharge with prescriptions for Protonix, Zofran.  I doubt that this is ACS or dissection. He has no risk factors for pulmonary embolus. I feel he is safe for discharge home.   At this time, I do not feel there is any life-threatening condition present. I have reviewed and discussed all results (EKG, imaging, lab, urine as appropriate) and exam findings with patient/family. I have reviewed nursing notes and appropriate previous records.  I feel the patient is safe to be discharged home without further emergent workup and can continue workup as an outpatient as needed. Discussed usual and customary return precautions. Patient/family verbalize understanding and are comfortable with this plan.  Outpatient follow-up has been provided if needed. All questions have been answered.     EKG Interpretation  Date/Time:  Monday June 16 2017 20:19:31 EDT Ventricular Rate:  94 PR Interval:  130 QRS Duration: 88 QT Interval:  344 QTC Calculation: 430 R Axis:   67 Text Interpretation:  Normal sinus rhythm Normal ECG Confirmed by Kalen Ratajczak, Cyril Mourning [phone]) on 06/17/2017 1:49:43 AM         Indi Willhite, Delice Bison, DO 06/17/17 [id number]

## 2017-06-17 NOTE — ED Notes (Signed)
Pt verbalized understanding of d/c instructions and has no further questions. Pt is stable, A&Ox4, VSS.  

## 2017-06-17 NOTE — Discharge Instructions (Signed)
Please avoid NSAIDs such as aspirin (Goody powders), ibuprofen (Motrin, Advil), naproxen (Aleve) as these may worsen your symptoms.  Tylenol 1000 mg every 6 hours is safe to take as long as you have no history of liver problems (heavy alcohol use, cirrhosis, hepatitis).  Please avoid spicy, acidic (citrus fruits, tomato based sauces, salsa), greasy, fatty foods.  Please avoid caffeine and alcohol.    You may use over-the-counter Maalox as needed for upper abdominal pain.  Please continue the Protonix that you have been prescribed. We have given you a refill today.

## 2017-06-23 ENCOUNTER — Other Ambulatory Visit: Payer: Self-pay | Admitting: Family Medicine

## 2017-06-23 DIAGNOSIS — E7800 Pure hypercholesterolemia, unspecified: Secondary | ICD-10-CM

## 2017-06-23 MED ORDER — ATORVASTATIN CALCIUM 40 MG PO TABS: 40.0000 mg | ORAL_TABLET | Freq: Every day | ORAL | 3 refills | Status: DC

## 2017-06-24 ENCOUNTER — Telehealth: Payer: Self-pay

## 2017-06-24 MED FILL — ?ATORVASTATIN 40MG TABLET: 40 | 30 days supply | Qty: 30 | Fill #0 | Status: TO

## 2017-06-24 NOTE — Telephone Encounter (Signed)
CMA call regarding lab results   Patient verify DOB  Patient was aware and understood  

## 2017-06-24 NOTE — Telephone Encounter (Signed)
-----   Message from Alfonse Spruce, [place] sent at 06/23/2017  7:25 PM EDT ----- Hgba1c 6.1 . Diabetes well controlled on current medications. Kidney function normal. Lipid levels were elevated. This can increase your risk of heart disease overtime. Your dose of atorvastatin will be increased. Start eating a diet low in saturated fat. Limit your intake of fried foods, red meats, and whole milk. Increase physical activity.  Recommend follow up in 3 months.

## 2017-06-24 NOTE — Telephone Encounter (Signed)
Patient was aware and understood

## 2017-06-24 NOTE — Telephone Encounter (Signed)
-----   Message from Alfonse Spruce, [place] sent at 06/23/2017  7:23 PM EDT ----- Kidney function normal. Lipid levels were elevated. This can increase your risk of heart disease overtime. Your dose of atorvastatin will be increased. Start eating a diet low in saturated fat. Limit your intake of fried foods, red meats, and whole milk. Increase physical activity.  Recommend follow up in 3 months.

## 2017-07-17 MED FILL — $Viagra 50mg tablet: 50 | 30 days supply | Qty: 10 | Fill #1

## 2017-08-13 MED FILL — $Viagra 50mg tablet: 50 | 30 days supply | Qty: 10 | Fill #2

## 2017-09-10 MED FILL — $Viagra 50mg tablet: 50 | 30 days supply | Qty: 10 | Fill #3

## 2017-09-11 ENCOUNTER — Encounter: Payer: Self-pay | Admitting: Family Medicine

## 2017-09-11 ENCOUNTER — Ambulatory Visit: Payer: Medicare Other | Attending: Family Medicine | Admitting: Family Medicine

## 2017-09-11 VITALS — BP 124/73 | HR 90 | Temp 99.1°F | Resp 18 | Ht 66.0 in | Wt 176.2 lb

## 2017-09-11 DIAGNOSIS — Z794 Long term (current) use of insulin: Secondary | ICD-10-CM

## 2017-09-11 DIAGNOSIS — K219 Gastro-esophageal reflux disease without esophagitis: Secondary | ICD-10-CM | POA: Insufficient documentation

## 2017-09-11 DIAGNOSIS — H9202 Otalgia, left ear: Secondary | ICD-10-CM | POA: Diagnosis not present

## 2017-09-11 DIAGNOSIS — I10 Essential (primary) hypertension: Secondary | ICD-10-CM

## 2017-09-11 DIAGNOSIS — E7800 Pure hypercholesterolemia, unspecified: Secondary | ICD-10-CM | POA: Diagnosis not present

## 2017-09-11 DIAGNOSIS — Z7984 Long term (current) use of oral hypoglycemic drugs: Secondary | ICD-10-CM | POA: Diagnosis not present

## 2017-09-11 DIAGNOSIS — Z79899 Other long term (current) drug therapy: Secondary | ICD-10-CM | POA: Diagnosis not present

## 2017-09-11 DIAGNOSIS — E782 Mixed hyperlipidemia: Secondary | ICD-10-CM | POA: Insufficient documentation

## 2017-09-11 DIAGNOSIS — E119 Type 2 diabetes mellitus without complications: Secondary | ICD-10-CM | POA: Insufficient documentation

## 2017-09-11 DIAGNOSIS — Z7982 Long term (current) use of aspirin: Secondary | ICD-10-CM | POA: Insufficient documentation

## 2017-09-11 LAB — GLUCOSE, POCT (MANUAL RESULT ENTRY): POC GLUCOSE: 256 mg/dL — AB (ref 70–99)

## 2017-09-11 LAB — POCT GLYCOSYLATED HEMOGLOBIN (HGB A1C): HEMOGLOBIN A1C: 6.1

## 2017-09-11 MED ORDER — PANTOPRAZOLE SODIUM 40 MG PO TBEC: 40.0000 mg | DELAYED_RELEASE_TABLET | Freq: Two times a day (BID) | ORAL | 6 refills | Status: DC

## 2017-09-11 MED ORDER — METFORMIN HCL ER 500 MG PO TB24: 500.0000 mg | ORAL_TABLET | Freq: Every day | ORAL | 3 refills | Status: DC

## 2017-09-11 MED ORDER — LISINOPRIL-HYDROCHLOROTHIAZIDE 20-25 MG PO TABS: 1.0000 | ORAL_TABLET | Freq: Every day | ORAL | 3 refills | Status: DC

## 2017-09-11 MED ORDER — OFLOXACIN 0.3 % OT SOLN: 10.0000 [drp] | Freq: Every day | OTIC | 0 refills | Status: DC

## 2017-09-11 MED ORDER — ATORVASTATIN CALCIUM 40 MG PO TABS: 40.0000 mg | ORAL_TABLET | Freq: Every day | ORAL | 3 refills | Status: DC

## 2017-09-11 MED ORDER — HYDRALAZINE HCL 25 MG PO TABS: 25.0000 mg | ORAL_TABLET | Freq: Three times a day (TID) | ORAL | 3 refills | Status: DC

## 2017-09-11 NOTE — Progress Notes (Signed)
Subjective:  Patient ID: Henry Walker, male    DOB: [birth year]  Age: 56 y.o. MRN: [id number]  CC: Diabetes   HPI [facility] presents for follow up. History of HTN, DM, HLD,GERD, and ED. History of DM. Symptoms: none. Patient denies foot ulcerations, nausea, paresthesia of the feet, polydipsia, polyuria, visual disturbances and vomitting.  Evaluation to date has been included: fasting lipid panel and hemoglobin A1C.  Home sugars: patient does not check sugars. Treatment to date: metformin. History of HTN.Marland Kitchen He is not exercising and is adherent to low salt diet.  He does not check BP at home. Cardiac symptoms none. Patient denies chest pain, chest pressure/discomfort, claudication, dyspnea, lower extremity edema, orthopnea, palpitations and syncope.  Cardiovascular risk factors: advanced age (older than 43 for men, 71 for women), diabetes mellitus, dyslipidemia, hypertension, male gender and sedentary lifestyle. Use of agents associated with hypertension: none. History of target organ damage: none.History of GERD. Associated symptoms include indigestion. He reports symptom have worsened over the last few weeks. He reports taking TUMS in addition to protonix with minimal relief of symptoms. He denies chest pain, dysphagia, midespigastric pain and coffe ground emesis. Left ear pain: Onset of symptoms was 2 days ago, stable since that time. He also notes no ear related symptoms.  He does not have a history of ear infections.  He does not have a history of swimming.  He has tried no medications  for his symptoms.   Outpatient Medications Prior to Visit  Medication Sig Dispense Refill  . amLODipine (NORVASC) 10 MG tablet Take 1 tablet (10 mg total) by mouth daily. 90 tablet 3  . aspirin EC 81 MG tablet Take 1 tablet (81 mg total) by mouth daily. 30 tablet 11  . glucose blood (ACCU-CHEK AVIVA PLUS) test strip Use as instructed 100 each 12  . glucose blood (ACCU-CHEK AVIVA) test strip  Use as instructed 100 each 12  . Lancet Devices (ACCU-CHEK SOFTCLIX) lancets Use as instructed 1 each 0  . sildenafil (VIAGRA) 50 MG tablet Take 1 tablet (50 mg total) by mouth daily as needed for erectile dysfunction. 30 tablet 3  . atorvastatin (LIPITOR) 40 MG tablet Take 1 tablet (40 mg total) by mouth daily. 90 tablet 3  . buPROPion (WELLBUTRIN SR) 150 MG 12 hr tablet Take 1 tablet (150 mg total) by mouth 2 (two) times daily. 60 tablet 2  . hydrALAZINE (APRESOLINE) 25 MG tablet Take 1 tablet (25 mg total) by mouth 3 (three) times daily. 270 tablet 3  . lisinopril-hydrochlorothiazide (PRINZIDE,ZESTORETIC) 20-25 MG tablet Take 1 tablet by mouth daily. 90 tablet 3  . metFORMIN (GLUCOPHAGE XR) 500 MG 24 hr tablet Take 1 tablet (500 mg total) by mouth daily with breakfast. 90 tablet 3  . methocarbamol (ROBAXIN) 500 MG tablet Take 1-2 tablets (500-1,000 mg total) by mouth every 8 (eight) hours as needed for muscle spasms (low back pain). 15 tablet 0  . ondansetron (ZOFRAN ODT) 4 MG disintegrating tablet Take 1 tablet (4 mg total) by mouth every 8 (eight) hours as needed for nausea or vomiting. 20 tablet 0  . pantoprazole (PROTONIX) 40 MG tablet Take 1 tablet (40 mg total) by mouth daily. 90 tablet 3  . pantoprazole (PROTONIX) 40 MG tablet Take 1 tablet (40 mg total) by mouth daily. 30 tablet 1  . sucralfate (CARAFATE) 1 GM/10ML suspension Take 10 mLs (1 g total) by mouth 4 (four) times daily -  with meals and at bedtime. 420 mL  0  . tamsulosin (FLOMAX) 0.4 MG CAPS capsule Take 1 capsule (0.4 mg total) by mouth daily. 30 capsule 3   Facility-Administered Medications Prior to Visit  Medication Dose Route Frequency Provider Last Rate Last Dose  . carbamide peroxide (DEBROX) 6.5 % otic solution 5 drop  5 drop Left EAR Once Langeland, Leda Quail, MD        ROS Review of Systems  Constitutional: Negative.   HENT: Positive for ear pain (left).   Eyes: Negative.   Respiratory: Negative.   Cardiovascular:  Negative.   Gastrointestinal:       Indigestion  Endocrine: Negative.   Musculoskeletal: Negative.   Skin: Negative.   Neurological: Negative.    Objective:  BP 124/73 (BP Location: Left Arm, Patient Position: Sitting, Cuff Size: Normal)   Pulse 90   Temp 99.1 F (37.3 C) (Oral)   Resp 18   Ht 5\' 6"  (1.676 m)   Wt 176 lb 3.2 oz (79.9 kg)   SpO2 96%   BMI 28.44 kg/m   BP/Weight 09/11/2017 06/17/2017 2/35/3614  Systolic BP 431 540 -  Diastolic BP 73 85 -  Wt. (Lbs) 176.2 - 175  BMI 28.44 27.41 -   Physical Exam  Constitutional: He is oriented to person, place, and time. He appears well-developed and well-nourished.  HENT:  Head: Normocephalic and atraumatic.  Right Ear: No tenderness. Tympanic membrane is not injected.  Left Ear: There is tenderness. Tympanic membrane is not injected.  Nose: Nose normal.  Mouth/Throat: Oropharynx is clear and moist.  Eyes: Conjunctivae are normal. Pupils are equal, round, and reactive to light.  Neck: Normal range of motion. Neck supple.  Cardiovascular: Normal rate, regular rhythm, normal heart sounds and intact distal pulses.  Pulmonary/Chest: Effort normal and breath sounds normal.  Abdominal: Soft. Bowel sounds are normal. He exhibits no distension.  Neurological: He is alert and oriented to person, place, and time. No sensory deficit.  Skin: Skin is warm and dry.  Psychiatric: He has a normal mood and affect.  Nursing note and vitals reviewed.  Diabetic Foot Exam - Simple   Simple Foot Form Diabetic Foot exam was performed with the following findings:  Yes 09/11/2017 10:30 AM  Visual Inspection No deformities, no ulcerations, no other skin breakdown bilaterally:  Yes Sensation Testing Intact to touch and monofilament testing bilaterally:  Yes Pulse Check Posterior Tibialis and Dorsalis pulse intact bilaterally:  Yes Comments      Assessment & Plan:   1. Essential hypertension  - hydrALAZINE (APRESOLINE) 25 MG tablet;  Take 1 tablet (25 mg total) 3 (three) times daily by mouth.  Dispense: 270 tablet; Refill: 3 - lisinopril-hydrochlorothiazide (PRINZIDE,ZESTORETIC) 20-25 MG tablet; Take 1 tablet daily by mouth.  Dispense: 90 tablet; Refill: 3  2. Gastroesophageal reflux disease without esophagitis  - H. pylori breath test - pantoprazole (PROTONIX) 40 MG tablet; Take 1 tablet (40 mg total) 2 (two) times daily before a meal by mouth.  Dispense: 60 tablet; Refill: 6  3. Diabetes mellitus without complication (HCC)  - Glucose (CBG) - HgB A1c  4. Mixed hyperlipidemia  - atorvastatin (LIPITOR) 40 MG tablet; Take 1 tablet (40 mg total) daily by mouth.  Dispense: 90 tablet; Refill: 3 - Lipid Panel  5. High cholesterol  - atorvastatin (LIPITOR) 40 MG tablet; Take 1 tablet (40 mg total) daily by mouth.  Dispense: 90 tablet; Refill: 3  6. Type 2 diabetes mellitus without complication, with long-term current use of insulin (HCC)  - metFORMIN (  GLUCOPHAGE XR) 500 MG 24 hr tablet; Take 1 tablet (500 mg total) daily with breakfast by mouth.  Dispense: 90 tablet; Refill: 3  7. Otalgia of left ear  - ofloxacin (FLOXIN) 0.3 % OTIC solution; Place 10 drops daily into the left ear. For 7 days.  Dispense: 5 mL; Refill: 0     Follow-up: Return in about 6 months (around 03/11/2018) for HTN/HLD/DM.   Alfonse Spruce FNP

## 2017-09-11 NOTE — Patient Instructions (Signed)
Food Choices for Gastroesophageal Reflux Disease, Adult When you have gastroesophageal reflux disease (GERD), the foods you eat and your eating habits are very important. Choosing the right foods can help ease your discomfort. What guidelines do I need to follow?  Choose fruits, vegetables, whole grains, and low-fat dairy products.  Choose low-fat meat, fish, and poultry.  Limit fats such as oils, salad dressings, butter, nuts, and avocado.  Keep a food diary. This helps you identify foods that cause symptoms.  Avoid foods that cause symptoms. These may be different for everyone.  Eat small meals often instead of 3 large meals a day.  Eat your meals slowly, in a place where you are relaxed.  Limit fried foods.  Cook foods using methods other than frying.  Avoid drinking alcohol.  Avoid drinking large amounts of liquids with your meals.  Avoid bending over or lying down until 2-3 hours after eating. What foods are not recommended? These are some foods and drinks that may make your symptoms worse: Vegetables  Tomatoes. Tomato juice. Tomato and spaghetti sauce. Chili peppers. Onion and garlic. Horseradish. Fruits  Oranges, grapefruit, and lemon (fruit and juice). Meats  High-fat meats, fish, and poultry. This includes hot dogs, ribs, ham, sausage, salami, and bacon. Dairy  Whole milk and chocolate milk. Sour cream. Cream. Butter. Ice cream. Cream cheese. Drinks  Coffee and tea. Bubbly (carbonated) drinks or energy drinks. Condiments  Hot sauce. Barbecue sauce. Sweets/Desserts  Chocolate and cocoa. Donuts. Peppermint and spearmint. Fats and Oils  High-fat foods. This includes French fries and potato chips. Other  Vinegar. Strong spices. This includes black pepper, white pepper, red pepper, cayenne, curry powder, cloves, ginger, and chili powder. The items listed above may not be a complete list of foods and drinks to avoid. Contact your dietitian for more information.    This information is not intended to replace advice given to you by your health care provider. Make sure you discuss any questions you have with your health care provider. Document Released: 04/14/2012 Document Revised: 03/21/2016 Document Reviewed: 08/18/2013 Elsevier Interactive Patient Education  2017 [employer] Inc[employer]  

## 2017-09-12 LAB — H. PYLORI BREATH TEST: H pylori Breath Test: NEGATIVE

## 2017-09-12 LAB — LIPID PANEL
CHOL/HDL RATIO: 5.3 ratio — AB (ref 0.0–5.0)
Cholesterol, Total: 154 mg/dL (ref 100–199)
HDL: 29 mg/dL — AB (ref >39)
LDL Calculated: 89 mg/dL (ref 0–99)
TRIGLYCERIDES: 179 mg/dL — AB (ref 0–149)
VLDL CHOLESTEROL CAL: 36 mg/dL (ref 5–40)

## 2017-09-22 ENCOUNTER — Telehealth: Payer: Self-pay

## 2017-09-22 ENCOUNTER — Other Ambulatory Visit: Payer: Self-pay | Admitting: Family Medicine

## 2017-09-22 DIAGNOSIS — E782 Mixed hyperlipidemia: Secondary | ICD-10-CM

## 2017-09-22 MED ORDER — OMEGA-3-ACID ETHYL ESTERS 1 G PO CAPS: 1.0000 g | ORAL_CAPSULE | Freq: Two times a day (BID) | ORAL | 5 refills | Status: DC

## 2017-09-22 NOTE — Telephone Encounter (Signed)
-----   Message from Alfonse Spruce, [place] sent at 09/22/2017  1:07 PM EST ----- -H.pylori is negative. H.pylori is a bacteria that can infect the stomach and cause stomach ulcers. Lipid levels were elevated. This can increase your risk of heart disease overtime. You will be prescribed lovaza. Start eating a diet low in saturated fat. Limit your intake of fried foods, red meats, and whole milk. Increase activity.

## 2017-09-22 NOTE — Telephone Encounter (Signed)
CMA call regarding lab results   Patient verify DOB  Patient was aware and understood  

## 2017-10-14 MED FILL — $Viagra 50mg tablet: 50 | 30 days supply | Qty: 10 | Fill #4

## 2017-11-09 ENCOUNTER — Encounter: Payer: Self-pay | Admitting: Internal Medicine

## 2017-11-11 MED FILL — $Viagra 50mg tablet: 50 | 30 days supply | Qty: 10 | Fill #5

## 2017-11-12 ENCOUNTER — Encounter: Payer: Self-pay | Admitting: Internal Medicine

## 2017-12-11 MED FILL — $Viagra 50mg tablet: 50 | 30 days supply | Qty: 10 | Fill #6

## 2018-01-12 MED FILL — $Viagra 50mg tablet: 50 | 30 days supply | Qty: 10 | Fill #7

## 2018-01-23 ENCOUNTER — Ambulatory Visit: Payer: Medicare Other | Attending: Family Medicine | Admitting: Family Medicine

## 2018-01-23 ENCOUNTER — Encounter: Payer: Self-pay | Admitting: Family Medicine

## 2018-01-23 VITALS — BP 123/69 | HR 60 | Temp 97.5°F | Ht 66.0 in | Wt 175.6 lb

## 2018-01-23 DIAGNOSIS — E782 Mixed hyperlipidemia: Secondary | ICD-10-CM | POA: Diagnosis not present

## 2018-01-23 DIAGNOSIS — K219 Gastro-esophageal reflux disease without esophagitis: Secondary | ICD-10-CM | POA: Insufficient documentation

## 2018-01-23 DIAGNOSIS — Z7982 Long term (current) use of aspirin: Secondary | ICD-10-CM | POA: Insufficient documentation

## 2018-01-23 DIAGNOSIS — E119 Type 2 diabetes mellitus without complications: Secondary | ICD-10-CM | POA: Diagnosis not present

## 2018-01-23 DIAGNOSIS — Z794 Long term (current) use of insulin: Secondary | ICD-10-CM | POA: Diagnosis not present

## 2018-01-23 DIAGNOSIS — I10 Essential (primary) hypertension: Secondary | ICD-10-CM | POA: Diagnosis not present

## 2018-01-23 DIAGNOSIS — Z1211 Encounter for screening for malignant neoplasm of colon: Secondary | ICD-10-CM

## 2018-01-23 LAB — POCT GLYCOSYLATED HEMOGLOBIN (HGB A1C): Hemoglobin A1C: 6.3

## 2018-01-23 LAB — GLUCOSE, POCT (MANUAL RESULT ENTRY): POC GLUCOSE: 113 mg/dL — AB (ref 70–99)

## 2018-01-23 MED ORDER — ATORVASTATIN CALCIUM 40 MG PO TABS: 40.0000 mg | ORAL_TABLET | Freq: Every day | ORAL | 1 refills | Status: DC

## 2018-01-23 MED ORDER — AMLODIPINE BESYLATE 10 MG PO TABS: 10.0000 mg | ORAL_TABLET | Freq: Every day | ORAL | 1 refills | Status: DC

## 2018-01-23 MED ORDER — LISINOPRIL-HYDROCHLOROTHIAZIDE 20-25 MG PO TABS: 1.0000 | ORAL_TABLET | Freq: Every day | ORAL | 1 refills | Status: DC

## 2018-01-23 MED ORDER — METFORMIN HCL ER 500 MG PO TB24: 500.0000 mg | ORAL_TABLET | Freq: Every day | ORAL | 1 refills | Status: DC

## 2018-01-23 MED ORDER — PANTOPRAZOLE SODIUM 40 MG PO TBEC: 40.0000 mg | DELAYED_RELEASE_TABLET | Freq: Every day | ORAL | 1 refills | Status: DC

## 2018-01-23 MED ORDER — HYDRALAZINE HCL 25 MG PO TABS: 25.0000 mg | ORAL_TABLET | Freq: Three times a day (TID) | ORAL | 1 refills | Status: DC

## 2018-01-23 NOTE — Patient Instructions (Signed)

## 2018-01-23 NOTE — Progress Notes (Signed)
Subjective:  Patient ID: Henry Walker, male    DOB: 1960-12-17  Age: 57 y.o. MRN: [id number]  CC: Diabetes   HPI Henry Walker is a 57 year old male with a history of type 2 diabetes mellitus (A1c 6.3, hypertension, hyperlipidemia, GERD who presents today to establish care with me.  For diabetes he remains on metformin and is doing well on it; denies hypoglycemia, numbness in extremities or visual concerns.  His last eye exam was this year with [facility]. Tolerating his antihypertensive and doing well on his statin with no complaints of myalgias or other adverse effects.  He does well on a diabetic, low-cholesterol, low-sodium diet and exercises-he goes for short walks as he is unable to do long stretches on walks due to his previous back surgery. Reflux symptoms are currently controlled on his present regimen, he denies nausea, vomiting, abdominal pain.  With regards to his health care maintenance his last colonoscopy was in 09/2014 and he had polyps resected which revealed tubular adenomas, negative for malignancy and recommendation for repeat colonoscopy in 2018/2019.  Past Medical History:  Diagnosis Date  . Diabetes mellitus without complication ([place])   . GERD (gastroesophageal reflux disease)   . Hx of adenomatous colonic polyps 10/06/2014  . Hyperlipidemia   . Hypertension     Past Surgical History:  Procedure Laterality Date  . BACK SURGERY    . COLONOSCOPY      No Known Allergies   Outpatient Medications Prior to Visit  Medication Sig Dispense Refill  . glucose blood (ACCU-CHEK AVIVA PLUS) test strip Use as instructed 100 each 12  . glucose blood (ACCU-CHEK AVIVA) test strip Use as instructed 100 each 12  . Lancet Devices (ACCU-CHEK SOFTCLIX) lancets Use as instructed 1 each 0  . omega-3 acid ethyl esters (LOVAZA) 1 g capsule Take 1 capsule (1 g total) by mouth 2 (two) times daily. 60 capsule 5  . sildenafil (VIAGRA) 50 MG tablet Take 1 tablet  (50 mg total) by mouth daily as needed for erectile dysfunction. 30 tablet 3  . amLODipine (NORVASC) 10 MG tablet Take 1 tablet (10 mg total) by mouth daily. 90 tablet 3  . atorvastatin (LIPITOR) 40 MG tablet Take 1 tablet (40 mg total) daily by mouth. 90 tablet 3  . hydrALAZINE (APRESOLINE) 25 MG tablet Take 1 tablet (25 mg total) 3 (three) times daily by mouth. 270 tablet 3  . lisinopril-hydrochlorothiazide (PRINZIDE,ZESTORETIC) 20-25 MG tablet Take 1 tablet daily by mouth. 90 tablet 3  . metFORMIN (GLUCOPHAGE XR) 500 MG 24 hr tablet Take 1 tablet (500 mg total) daily with breakfast by mouth. 90 tablet 3  . pantoprazole (PROTONIX) 40 MG tablet Take 1 tablet (40 mg total) 2 (two) times daily before a meal by mouth. 60 tablet 6  . aspirin EC 81 MG tablet Take 1 tablet (81 mg total) by mouth daily. (Patient not taking: Reported on 01/23/2018) 30 tablet 11  . ofloxacin (FLOXIN) 0.3 % OTIC solution Place 10 drops daily into the left ear. For 7 days. (Patient not taking: Reported on 01/23/2018) 5 mL 0   Facility-Administered Medications Prior to Visit  Medication Dose Route Frequency Provider Last Rate Last Dose  . carbamide peroxide (DEBROX) 6.5 % otic solution 5 drop  5 drop Left EAR Once Langeland, Dawn T, MD        ROS Review of Systems  Constitutional: Negative for activity change and appetite change.  HENT: Negative for sinus pressure and sore throat.  Eyes: Negative for visual disturbance.  Respiratory: Negative for cough, chest tightness and shortness of breath.   Cardiovascular: Negative for chest pain and leg swelling.  Gastrointestinal: Negative for abdominal distention, abdominal pain, constipation and diarrhea.  Endocrine: Negative.   Genitourinary: Negative for dysuria.  Musculoskeletal: Negative for joint swelling and myalgias.  Skin: Negative for rash.  Allergic/Immunologic: Negative.   Neurological: Negative for weakness, light-headedness and numbness.    Psychiatric/Behavioral: Negative for dysphoric mood and suicidal ideas.    Objective:  BP 123/69   Pulse 60   Temp (!) 97.5 F (36.4 C) (Oral)   Ht _0  (1.676 m)   Wt 175 lb 9.6 oz (79.7 kg)   SpO2 99%   BMI 28.34 kg/m   BP/Weight 01/23/2018 09/11/2017 4/74/2595  Systolic BP 638 756 433  Diastolic BP 69 73 85  Wt. (Lbs) 175.6 176.2 -  BMI 28.34 28.44 27.41      Physical Exam  Constitutional: He is oriented to person, place, and time. He appears well-developed and well-nourished.  Cardiovascular: Normal rate, normal heart sounds and intact distal pulses.  No murmur heard. Pulmonary/Chest: Effort normal and breath sounds normal. He has no wheezes. He has no rales. He exhibits no tenderness.  Abdominal: Soft. Bowel sounds are normal. He exhibits no distension and no mass. There is no tenderness.  Musculoskeletal: Normal range of motion.  Neurological: He is alert and oriented to person, place, and time.  Skin: Skin is warm and dry.  Psychiatric: He has a normal mood and affect.     CMP Latest Ref Rng & Units 06/16/2017 06/11/2017 12/04/2016  Glucose 65 - 99 mg/dL 78 101(H) 114(H)  BUN 6 - 20 mg/dL 24(H) 13 17  Creatinine 0.61 - 1.24 mg/dL 1.28(H) 1.24 1.53(H)  Sodium 135 - 145 mmol/L 135 140 140  Potassium 3.5 - 5.1 mmol/L 3.4(L) 4.0 3.9  Chloride 101 - 111 mmol/L 100(L) 102 104  CO2 22 - 32 mmol/L 21(L) 21 23  Calcium 8.9 - 10.3 mg/dL 9.8 9.9 10.1  Total Protein 6.5 - 8.1 g/dL 7.6 - -  Total Bilirubin 0.3 - 1.2 mg/dL 0.6 - -  Alkaline Phos 38 - 126 U/L 50 - -  AST 15 - 41 U/L 35 - -  ALT 17 - 63 U/L 49 - -    Lab Results  Component Value Date   HGBA1C 6.3 01/23/2018    Assessment & Plan:   1. Type 2 diabetes mellitus without complication, with long-term current use of insulin (HCC) With A1c of 6.3 Counseled on Diabetic diet, my plate method, 295 minutes of moderate intensity exercise/week Keep blood sugar logs with fasting goals of 80-120 mg/dl, random of  less than 180 and in the event of sugars less than 60 mg/dl or greater than 400 mg/dl please notify the clinic ASAP. It is recommended that you undergo annual eye exams and annual foot exams. Pneumonia vaccine is recommended. - POCT glucose (manual entry) - POCT glycosylated hemoglobin (Hb A1C) - CMP14+EGFR - Microalbumin/Creatinine Ratio, Urine - metFORMIN (GLUCOPHAGE XR) 500 MG 24 hr tablet; Take 1 tablet (500 mg total) by mouth daily with breakfast.  Dispense: 90 tablet; Refill: 1  2. Gastroesophageal reflux disease without esophagitis Controlled - pantoprazole (PROTONIX) 40 MG tablet; Take 1 tablet (40 mg total) by mouth daily.  Dispense: 90 tablet; Refill: 1  3. Essential hypertension Controlled Counseled on blood pressure goal of less than 130/80, low-sodium, DASH diet, medication compliance, 150 minutes of moderate intensity exercise  per week. Discussed medication compliance, adverse effects. - lisinopril-hydrochlorothiazide (PRINZIDE,ZESTORETIC) 20-25 MG tablet; Take 1 tablet by mouth daily.  Dispense: 90 tablet; Refill: 1 - hydrALAZINE (APRESOLINE) 25 MG tablet; Take 1 tablet (25 mg total) by mouth 3 (three) times daily.  Dispense: 270 tablet; Refill: 1 - amLODipine (NORVASC) 10 MG tablet; Take 1 tablet (10 mg total) by mouth daily.  Dispense: 90 tablet; Refill: 1  4. Mixed hyperlipidemia Controlled Lipid panel - atorvastatin (LIPITOR) 40 MG tablet; Take 1 tablet (40 mg total) by mouth daily.  Dispense: 90 tablet; Refill: 1  5. Screening for colon cancer History of adenomatous and last colonoscopy from 09/2014 - Ambulatory referral to Gastroenterology   Meds ordered this encounter  Medications  . pantoprazole (PROTONIX) 40 MG tablet    Sig: Take 1 tablet (40 mg total) by mouth daily.    Dispense:  90 tablet    Refill:  1  . metFORMIN (GLUCOPHAGE XR) 500 MG 24 hr tablet    Sig: Take 1 tablet (500 mg total) by mouth daily with breakfast.    Dispense:  90 tablet     Refill:  1  . lisinopril-hydrochlorothiazide (PRINZIDE,ZESTORETIC) 20-25 MG tablet    Sig: Take 1 tablet by mouth daily.    Dispense:  90 tablet    Refill:  1  . hydrALAZINE (APRESOLINE) 25 MG tablet    Sig: Take 1 tablet (25 mg total) by mouth 3 (three) times daily.    Dispense:  270 tablet    Refill:  1  . atorvastatin (LIPITOR) 40 MG tablet    Sig: Take 1 tablet (40 mg total) by mouth daily.    Dispense:  90 tablet    Refill:  1  . amLODipine (NORVASC) 10 MG tablet    Sig: Take 1 tablet (10 mg total) by mouth daily.    Dispense:  90 tablet    Refill:  1    Follow-up: Return in about 3 months (around 04/25/2018) for Follow-up of chronic medical conditions.   Charlott Rakes MD

## 2018-01-23 NOTE — Addendum Note (Signed)
Addended by: Charlott Rakes on: 01/23/2018 10:44 AM   Modules accepted: Orders

## 2018-01-24 LAB — CMP14+EGFR
A/G RATIO: 1.8 (ref 1.2–2.2)
ALK PHOS: 63 IU/L (ref 39–117)
ALT: 29 IU/L (ref 0–44)
AST: 22 IU/L (ref 0–40)
Albumin: 4.9 g/dL (ref 3.5–5.5)
BUN/Creatinine Ratio: 12 (ref 9–20)
BUN: 17 mg/dL (ref 6–24)
Bilirubin Total: 0.5 mg/dL (ref 0.0–1.2)
CALCIUM: 10.4 mg/dL — AB (ref 8.7–10.2)
CO2: 21 mmol/L (ref 20–29)
CREATININE: 1.38 mg/dL — AB (ref 0.76–1.27)
Chloride: 102 mmol/L (ref 96–106)
GFR calc Af Amer: 65 mL/min/{1.73_m2} (ref >59)
GFR calc non Af Amer: 56 mL/min/{1.73_m2} — ABNORMAL LOW (ref >59)
GLOBULIN, TOTAL: 2.8 g/dL (ref 1.5–4.5)
Glucose: 98 mg/dL (ref 65–99)
POTASSIUM: 4.1 mmol/L (ref 3.5–5.2)
SODIUM: 143 mmol/L (ref 134–144)
Total Protein: 7.7 g/dL (ref 6.0–8.5)

## 2018-01-24 LAB — LIPID PANEL
CHOLESTEROL TOTAL: 153 mg/dL (ref 100–199)
Chol/HDL Ratio: 4.6 ratio (ref 0.0–5.0)
HDL: 33 mg/dL — AB (ref >39)
LDL Calculated: 93 mg/dL (ref 0–99)
TRIGLYCERIDES: 133 mg/dL (ref 0–149)
VLDL Cholesterol Cal: 27 mg/dL (ref 5–40)

## 2018-01-26 ENCOUNTER — Telehealth: Payer: Self-pay

## 2018-01-26 NOTE — Telephone Encounter (Signed)
Patient was called and a voicemail was left informing patient to return phone call for lab results.  If patient returns phone call please inform patient that lab are stable. 

## 2018-01-27 ENCOUNTER — Telehealth: Payer: Self-pay | Admitting: Family Medicine

## 2018-01-27 NOTE — Telephone Encounter (Signed)
Patient was informed of lab results and verbalized understanding °

## 2018-02-09 MED FILL — $Viagra 50mg tablet: 50 | 30 days supply | Qty: 10 | Fill #8

## 2018-03-10 MED FILL — $Viagra 50mg tablet: 50 | 30 days supply | Qty: 10 | Fill #9

## 2018-04-03 MED FILL — $Viagra 50mg tablet: 50 | 30 days supply | Qty: 10 | Fill #10

## 2018-05-04 MED FILL — $Viagra 50mg tablet: 50 | 30 days supply | Qty: 10 | Fill #11

## 2018-05-15 ENCOUNTER — Encounter

## 2018-06-02 MED FILL — $Viagra 50mg tablet: 50 | 9 days supply | Qty: 3 | Fill #12

## 2018-06-15 ENCOUNTER — Ambulatory Visit: Payer: Medicare Other | Admitting: Family Medicine

## 2018-06-19 ENCOUNTER — Other Ambulatory Visit: Payer: Self-pay

## 2018-06-19 ENCOUNTER — Encounter: Payer: Self-pay | Admitting: Family Medicine

## 2018-06-19 ENCOUNTER — Ambulatory Visit: Payer: Medicare Other | Attending: Family Medicine | Admitting: Family Medicine

## 2018-06-19 VITALS — BP 130/82 | HR 63 | Temp 97.7°F | Wt 163.4 lb

## 2018-06-19 DIAGNOSIS — Z79899 Other long term (current) drug therapy: Secondary | ICD-10-CM | POA: Insufficient documentation

## 2018-06-19 DIAGNOSIS — Z23 Encounter for immunization: Secondary | ICD-10-CM

## 2018-06-19 DIAGNOSIS — Z794 Long term (current) use of insulin: Secondary | ICD-10-CM | POA: Diagnosis not present

## 2018-06-19 DIAGNOSIS — Z1211 Encounter for screening for malignant neoplasm of colon: Secondary | ICD-10-CM

## 2018-06-19 DIAGNOSIS — N529 Male erectile dysfunction, unspecified: Secondary | ICD-10-CM | POA: Insufficient documentation

## 2018-06-19 DIAGNOSIS — Z7982 Long term (current) use of aspirin: Secondary | ICD-10-CM | POA: Diagnosis not present

## 2018-06-19 DIAGNOSIS — E119 Type 2 diabetes mellitus without complications: Secondary | ICD-10-CM | POA: Diagnosis not present

## 2018-06-19 DIAGNOSIS — I10 Essential (primary) hypertension: Secondary | ICD-10-CM | POA: Insufficient documentation

## 2018-06-19 DIAGNOSIS — Z9889 Other specified postprocedural states: Secondary | ICD-10-CM | POA: Diagnosis not present

## 2018-06-19 DIAGNOSIS — K219 Gastro-esophageal reflux disease without esophagitis: Secondary | ICD-10-CM | POA: Diagnosis not present

## 2018-06-19 DIAGNOSIS — E782 Mixed hyperlipidemia: Secondary | ICD-10-CM

## 2018-06-19 LAB — POCT GLYCOSYLATED HEMOGLOBIN (HGB A1C): HBA1C, POC (CONTROLLED DIABETIC RANGE): 6 % (ref 0.0–7.0)

## 2018-06-19 LAB — GLUCOSE, POCT (MANUAL RESULT ENTRY): POC Glucose: 131 mg/dl — AB (ref 70–99)

## 2018-06-19 MED ORDER — SILDENAFIL CITRATE 50 MG PO TABS: 50.0000 mg | ORAL_TABLET | Freq: Every day | ORAL | 3 refills | Status: DC | PRN

## 2018-06-19 MED ORDER — METFORMIN HCL ER 500 MG PO TB24: 500.0000 mg | ORAL_TABLET | Freq: Every day | ORAL | 1 refills | Status: DC

## 2018-06-19 MED ORDER — PANTOPRAZOLE SODIUM 40 MG PO TBEC: 40.0000 mg | DELAYED_RELEASE_TABLET | Freq: Every day | ORAL | 1 refills | Status: DC

## 2018-06-19 MED ORDER — LISINOPRIL-HYDROCHLOROTHIAZIDE 20-25 MG PO TABS: 1.0000 | ORAL_TABLET | Freq: Every day | ORAL | 1 refills | Status: DC

## 2018-06-19 MED ORDER — HYDRALAZINE HCL 25 MG PO TABS: 25.0000 mg | ORAL_TABLET | Freq: Three times a day (TID) | ORAL | 1 refills | Status: DC

## 2018-06-19 MED ORDER — ATORVASTATIN CALCIUM 40 MG PO TABS: 40.0000 mg | ORAL_TABLET | Freq: Every day | ORAL | 1 refills | Status: DC

## 2018-06-19 MED ORDER — AMLODIPINE BESYLATE 10 MG PO TABS: 10.0000 mg | ORAL_TABLET | Freq: Every day | ORAL | 1 refills | Status: DC

## 2018-06-19 MED FILL — !VIAGRA 50 MG TABLET: 50 | 30 days supply | Qty: 10 | Fill #0

## 2018-06-19 NOTE — Progress Notes (Signed)
Subjective:  Patient ID: Henry Walker, male    DOB: 1961/04/10  Age: 57 y.o. MRN: [id number]  CC: Diabetes   HPI Henry Walker is a 57 year old male with a history of type 2 diabetes mellitus (A1c 6.0), hypertension, hyperlipidemia, GERD who presents to the clinic for a follow-up visit. He denies hypoglycemia and his A1c is 6.0 which has improved from 6.3 previously.  He denies visual concerns, numbness in extremities and is not up-to-date on an annual eye exam but has it scheduled for 07/2018. He is compliant with his antihypertensive and statin and denies adverse effects from his medications; reflux symptoms are controlled. With regards to his healthcare maintenance I had referred him for a colonoscopy however he was unable to afford high co-pay required by [facility] He has no additional concerns today.  Past Medical History:  Diagnosis Date  . Diabetes mellitus without complication ([place])   . GERD (gastroesophageal reflux disease)   . Hx of adenomatous colonic polyps 10/06/2014  . Hyperlipidemia   . Hypertension     Past Surgical History:  Procedure Laterality Date  . BACK SURGERY    . COLONOSCOPY      No Known Allergies   Past Medical History:  Diagnosis Date  . Diabetes mellitus without complication ([place])   . GERD (gastroesophageal reflux disease)   . Hx of adenomatous colonic polyps 10/06/2014  . Hyperlipidemia   . Hypertension     Past Surgical History:  Procedure Laterality Date  . BACK SURGERY    . COLONOSCOPY      No Known Allergies   Outpatient Medications Prior to Visit  Medication Sig Dispense Refill  . aspirin EC 81 MG tablet Take 1 tablet (81 mg total) by mouth daily. (Patient not taking: Reported on 01/23/2018) 30 tablet 11  . glucose blood (ACCU-CHEK AVIVA PLUS) test strip Use as instructed 100 each 12  . glucose blood (ACCU-CHEK AVIVA) test strip Use as instructed 100 each 12  . Lancet Devices (ACCU-CHEK SOFTCLIX) lancets Use  as instructed 1 each 0  . ofloxacin (FLOXIN) 0.3 % OTIC solution Place 10 drops daily into the left ear. For 7 days. (Patient not taking: Reported on 01/23/2018) 5 mL 0  . omega-3 acid ethyl esters (LOVAZA) 1 g capsule Take 1 capsule (1 g total) by mouth 2 (two) times daily. 60 capsule 5  . amLODipine (NORVASC) 10 MG tablet Take 1 tablet (10 mg total) by mouth daily. 90 tablet 1  . atorvastatin (LIPITOR) 40 MG tablet Take 1 tablet (40 mg total) by mouth daily. 90 tablet 1  . hydrALAZINE (APRESOLINE) 25 MG tablet Take 1 tablet (25 mg total) by mouth 3 (three) times daily. 270 tablet 1  . lisinopril-hydrochlorothiazide (PRINZIDE,ZESTORETIC) 20-25 MG tablet Take 1 tablet by mouth daily. 90 tablet 1  . metFORMIN (GLUCOPHAGE XR) 500 MG 24 hr tablet Take 1 tablet (500 mg total) by mouth daily with breakfast. 90 tablet 1  . pantoprazole (PROTONIX) 40 MG tablet Take 1 tablet (40 mg total) by mouth daily. 90 tablet 1  . sildenafil (VIAGRA) 50 MG tablet Take 1 tablet (50 mg total) by mouth daily as needed for erectile dysfunction. 30 tablet 3   Facility-Administered Medications Prior to Visit  Medication Dose Route Frequency Provider Last Rate Last Dose  . carbamide peroxide (DEBROX) 6.5 % otic solution 5 drop  5 drop Left EAR Once Langeland, Leda Quail, MD        ROS Review of Systems  Constitutional:  Negative for activity change and appetite change.  HENT: Negative for sinus pressure and sore throat.   Eyes: Negative for visual disturbance.  Respiratory: Negative for cough, chest tightness and shortness of breath.   Cardiovascular: Negative for chest pain and leg swelling.  Gastrointestinal: Negative for abdominal distention, abdominal pain, constipation and diarrhea.  Endocrine: Negative.   Genitourinary: Negative for dysuria.  Musculoskeletal: Negative for joint swelling and myalgias.  Skin: Negative for rash.  Allergic/Immunologic: Negative.   Neurological: Negative for weakness, light-headedness  and numbness.  Psychiatric/Behavioral: Negative for dysphoric mood and suicidal ideas.    Objective:  BP 130/82   Pulse 63   Temp 97.7 F (36.5 C) (Oral)   Wt 163 lb 6.4 oz (74.1 kg)   SpO2 98%   BMI 26.37 kg/m   BP/Weight 06/19/2018 01/23/2018 00/34/9179  Systolic BP 150 569 794  Diastolic BP 82 69 73  Wt. (Lbs) 163.4 175.6 176.2  BMI 26.37 28.34 28.44      Physical Exam  Constitutional: He is oriented to person, place, and time. He appears well-developed and well-nourished.  Cardiovascular: Normal rate, normal heart sounds and intact distal pulses.  No murmur heard. Pulmonary/Chest: Effort normal and breath sounds normal. He has no wheezes. He has no rales. He exhibits no tenderness.  Abdominal: Soft. Bowel sounds are normal. He exhibits no distension and no mass. There is no tenderness.  Musculoskeletal: Normal range of motion.  Neurological: He is alert and oriented to person, place, and time.  Skin: Skin is warm and dry.  Psychiatric: He has a normal mood and affect.     CMP Latest Ref Rng & Units 01/23/2018 06/16/2017 06/11/2017  Glucose 65 - 99 mg/dL 98 78 101(H)  BUN 6 - 24 mg/dL 17 24(H) 13  Creatinine 0.76 - 1.27 mg/dL 1.38(H) 1.28(H) 1.24  Sodium 134 - 144 mmol/L 143 135 140  Potassium 3.5 - 5.2 mmol/L 4.1 3.4(L) 4.0  Chloride 96 - 106 mmol/L 102 100(L) 102  CO2 20 - 29 mmol/L 21 21(L) 21  Calcium 8.7 - 10.2 mg/dL 10.4(H) 9.8 9.9  Total Protein 6.0 - 8.5 g/dL 7.7 7.6 -  Total Bilirubin 0.0 - 1.2 mg/dL 0.5 0.6 -  Alkaline Phos 39 - 117 IU/L 63 50 -  AST 0 - 40 IU/L 22 35 -  ALT 0 - 44 IU/L 29 49 -    Lipid Panel     Component Value Date/Time   CHOL 153 01/23/2018 1038   TRIG 133 01/23/2018 1038   HDL 33 (L) 01/23/2018 1038   CHOLHDL 4.6 01/23/2018 1038   CHOLHDL 5.9 (H) 01/05/2016 1155   VLDL 30 01/05/2016 1155   LDLCALC 93 01/23/2018 1038    Lab Results  Component Value Date   HGBA1C 6.0 06/19/2018     Assessment & Plan:   1. Essential  hypertension Controlled Counseled on blood pressure goal of less than 130/80, low-sodium, DASH diet, medication compliance, 150 minutes of moderate intensity exercise per week. Discussed medication compliance, adverse effects. - amLODipine (NORVASC) 10 MG tablet; Take 1 tablet (10 mg total) by mouth daily.  Dispense: 90 tablet; Refill: 1 - hydrALAZINE (APRESOLINE) 25 MG tablet; Take 1 tablet (25 mg total) by mouth 3 (three) times daily.  Dispense: 270 tablet; Refill: 1 - lisinopril-hydrochlorothiazide (PRINZIDE,ZESTORETIC) 20-25 MG tablet; Take 1 tablet by mouth daily.  Dispense: 90 tablet; Refill: 1 - CMP14+EGFR  2. Mixed hyperlipidemia Controlled Low-cholesterol diet - atorvastatin (LIPITOR) 40 MG tablet; Take 1 tablet (40 mg  total) by mouth daily.  Dispense: 90 tablet; Refill: 1  3. Type 2 diabetes mellitus without complication, with long-term current use of insulin (HCC) Controlled with A1c of 6.0 Has upcoming eye exam in 07/2018 - POCT A1C - Glucose (CBG) - metFORMIN (GLUCOPHAGE XR) 500 MG 24 hr tablet; Take 1 tablet (500 mg total) by mouth daily with breakfast.  Dispense: 90 tablet; Refill: 1 - Microalbumin/Creatinine Ratio, Urine  4. Gastroesophageal reflux disease without esophagitis Controlled - pantoprazole (PROTONIX) 40 MG tablet; Take 1 tablet (40 mg total) by mouth daily.  Dispense: 90 tablet; Refill: 1  5. Erectile dysfunction, unspecified erectile dysfunction type Refilled Sidenafil  6. Screening for colon cancer - Fecal occult blood, imunochemical([facility]); Future - Fecal occult blood, imunochemical([facility])  7. Need for immunization against influenza - Flu Vaccine QUAD 36+ mos IM   Meds ordered this encounter  Medications  . amLODipine (NORVASC) 10 MG tablet    Sig: Take 1 tablet (10 mg total) by mouth daily.    Dispense:  90 tablet    Refill:  1  . atorvastatin (LIPITOR) 40 MG tablet    Sig: Take 1 tablet (40 mg total) by mouth daily.     Dispense:  90 tablet    Refill:  1  . hydrALAZINE (APRESOLINE) 25 MG tablet    Sig: Take 1 tablet (25 mg total) by mouth 3 (three) times daily.    Dispense:  270 tablet    Refill:  1  . lisinopril-hydrochlorothiazide (PRINZIDE,ZESTORETIC) 20-25 MG tablet    Sig: Take 1 tablet by mouth daily.    Dispense:  90 tablet    Refill:  1  . metFORMIN (GLUCOPHAGE XR) 500 MG 24 hr tablet    Sig: Take 1 tablet (500 mg total) by mouth daily with breakfast.    Dispense:  90 tablet    Refill:  1  . pantoprazole (PROTONIX) 40 MG tablet    Sig: Take 1 tablet (40 mg total) by mouth daily.    Dispense:  90 tablet    Refill:  1  . DISCONTD: sildenafil (VIAGRA) 50 MG tablet    Sig: Take 1 tablet (50 mg total) by mouth daily as needed for erectile dysfunction. At least 24 hours between doses    Dispense:  10 tablet    Refill:  3    Follow-up: Return in about 6 months (around 12/20/2018) for follow up of chronic medical conditions.   Charlott Rakes MD

## 2018-06-19 NOTE — Patient Instructions (Signed)
Diabetes Mellitus and Nutrition When you have diabetes (diabetes mellitus), it is very important to have healthy eating habits because your blood sugar (glucose) levels are greatly affected by what you eat and drink. Eating healthy foods in the appropriate amounts, at about the same times every day, can help you:  Control your blood glucose.  Lower your risk of heart disease.  Improve your blood pressure.  Reach or maintain a healthy weight.  Every person with diabetes is different, and each person has different needs for a meal plan. Your health care provider may recommend that you work with a diet and nutrition specialist (dietitian) to make a meal plan that is best for you. Your meal plan may vary depending on factors such as:  The calories you need.  The medicines you take.  Your weight.  Your blood glucose, blood pressure, and cholesterol levels.  Your activity level.  Other health conditions you have, such as heart or kidney disease.  How do carbohydrates affect me? Carbohydrates affect your blood glucose level more than any other type of food. Eating carbohydrates naturally increases the amount of glucose in your blood. Carbohydrate counting is a method for keeping track of how many carbohydrates you eat. Counting carbohydrates is important to keep your blood glucose at a healthy level, especially if you use insulin or take certain oral diabetes medicines. It is important to know how many carbohydrates you can safely have in each meal. This is different for every person. Your dietitian can help you calculate how many carbohydrates you should have at each meal and for snack. Foods that contain carbohydrates include:  Bread, cereal, rice, pasta, and crackers.  Potatoes and corn.  Peas, beans, and lentils.  Milk and yogurt.  Fruit and juice.  Desserts, such as cakes, cookies, ice cream, and candy.  How does alcohol affect me? Alcohol can cause a sudden decrease in blood  glucose (hypoglycemia), especially if you use insulin or take certain oral diabetes medicines. Hypoglycemia can be a life-threatening condition. Symptoms of hypoglycemia (sleepiness, dizziness, and confusion) are similar to symptoms of having too much alcohol. If your health care provider says that alcohol is safe for you, follow these guidelines:  Limit alcohol intake to no more than 1 drink per day for nonpregnant women and 2 drinks per day for men. One drink equals 12 oz of beer, 5 oz of wine, or 1 oz of hard liquor.  Do not drink on an empty stomach.  Keep yourself hydrated with water, diet soda, or unsweetened iced tea.  Keep in mind that regular soda, juice, and other mixers may contain a lot of sugar and must be counted as carbohydrates.  What are tips for following this plan? Reading food labels  Start by checking the serving size on the label. The amount of calories, carbohydrates, fats, and other nutrients listed on the label are based on one serving of the food. Many foods contain more than one serving per package.  Check the total grams (g) of carbohydrates in one serving. You can calculate the number of servings of carbohydrates in one serving by dividing the total carbohydrates by 15. For example, if a food has 30 g of total carbohydrates, it would be equal to 2 servings of carbohydrates.  Check the number of grams (g) of saturated and trans fats in one serving. Choose foods that have low or no amount of these fats.  Check the number of milligrams (mg) of sodium in one serving. Most people   should limit total sodium intake to less than 2,300 mg per day.  Always check the nutrition information of foods labeled as "low-fat" or "nonfat". These foods may be higher in added sugar or refined carbohydrates and should be avoided.  Talk to your dietitian to identify your daily goals for nutrients listed on the label. Shopping  Avoid buying canned, premade, or processed foods. These  foods tend to be high in fat, sodium, and added sugar.  Shop around the outside edge of the grocery store. This includes fresh fruits and vegetables, bulk grains, fresh meats, and fresh dairy. Cooking  Use low-heat cooking methods, such as baking, instead of high-heat cooking methods like deep frying.  Cook using healthy oils, such as olive, canola, or sunflower oil.  Avoid cooking with butter, cream, or high-fat meats. Meal planning  Eat meals and snacks regularly, preferably at the same times every day. Avoid going long periods of time without eating.  Eat foods high in fiber, such as fresh fruits, vegetables, beans, and whole grains. Talk to your dietitian about how many servings of carbohydrates you can eat at each meal.  Eat 4-6 ounces of lean protein each day, such as lean meat, chicken, fish, eggs, or tofu. 1 ounce is equal to 1 ounce of meat, chicken, or fish, 1 egg, or 1/4 cup of tofu.  Eat some foods each day that contain healthy fats, such as avocado, nuts, seeds, and fish. Lifestyle   Check your blood glucose regularly.  Exercise at least 30 minutes 5 or more days each week, or as told by your health care provider.  Take medicines as told by your health care provider.  Do not use any products that contain nicotine or tobacco, such as cigarettes and e-cigarettes. If you need help quitting, ask your health care provider.  Work with a counselor or diabetes educator to identify strategies to manage stress and any emotional and social challenges. What are some questions to ask my health care provider?  Do I need to meet with a diabetes educator?  Do I need to meet with a dietitian?  What number can I call if I have questions?  When are the best times to check my blood glucose? Where to find more information:  American Diabetes Association: diabetes.org/food-and-fitness/food  Academy of Nutrition and Dietetics:  www.eatright.org/resources/health/diseases-and-conditions/diabetes  National Institute of Diabetes and Digestive and Kidney Diseases (NIH): www.niddk.nih.gov/health-information/diabetes/overview/diet-eating-physical-activity Summary  A healthy meal plan will help you control your blood glucose and maintain a healthy lifestyle.  Working with a diet and nutrition specialist (dietitian) can help you make a meal plan that is best for you.  Keep in mind that carbohydrates and alcohol have immediate effects on your blood glucose levels. It is important to count carbohydrates and to use alcohol carefully. This information is not intended to replace advice given to you by your health care provider. Make sure you discuss any questions you have with your health care provider. Document Released: 07/11/2005 Document Revised: 11/18/2016 Document Reviewed: 11/18/2016 Elsevier Interactive Patient Education  2018 [employer].  

## 2018-06-19 NOTE — Telephone Encounter (Signed)
RX not received by pharmacy, resent to [facility] pharmacy per pt request

## 2018-06-20 LAB — CMP14+EGFR
ALBUMIN: 4.6 g/dL (ref 3.5–5.5)
ALT: 28 IU/L (ref 0–44)
AST: 20 IU/L (ref 0–40)
Albumin/Globulin Ratio: 1.8 (ref 1.2–2.2)
Alkaline Phosphatase: 61 IU/L (ref 39–117)
BILIRUBIN TOTAL: 0.5 mg/dL (ref 0.0–1.2)
BUN / CREAT RATIO: 13 (ref 9–20)
BUN: 17 mg/dL (ref 6–24)
CHLORIDE: 99 mmol/L (ref 96–106)
CO2: 22 mmol/L (ref 20–29)
Calcium: 10.1 mg/dL (ref 8.7–10.2)
Creatinine, Ser: 1.3 mg/dL — ABNORMAL HIGH (ref 0.76–1.27)
GFR calc non Af Amer: 60 mL/min/{1.73_m2} (ref >59)
GFR, EST AFRICAN AMERICAN: 70 mL/min/{1.73_m2} (ref >59)
GLOBULIN, TOTAL: 2.6 g/dL (ref 1.5–4.5)
Glucose: 106 mg/dL — ABNORMAL HIGH (ref 65–99)
Potassium: 3.9 mmol/L (ref 3.5–5.2)
Sodium: 138 mmol/L (ref 134–144)
TOTAL PROTEIN: 7.2 g/dL (ref 6.0–8.5)

## 2018-06-20 LAB — MICROALBUMIN / CREATININE URINE RATIO
CREATININE, UR: 93.5 mg/dL
Microalbumin, Urine: <3 ug/mL

## 2018-07-15 MED FILL — !VIAGRA 50 MG TABLET: 50 | 30 days supply | Qty: 10 | Fill #1

## 2018-07-17 ENCOUNTER — Telehealth: Payer: Self-pay | Admitting: Family Medicine

## 2018-07-17 NOTE — Telephone Encounter (Signed)
Patient came in and dropped off paperwork to the front office for the Provider to fill out

## 2018-07-20 ENCOUNTER — Ambulatory Visit: Payer: Medicare Other | Attending: Family Medicine | Admitting: *Deleted

## 2018-07-20 DIAGNOSIS — Z111 Encounter for screening for respiratory tuberculosis: Secondary | ICD-10-CM

## 2018-07-20 NOTE — Progress Notes (Signed)
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 He denies any history of reaction to previous TB tests and he denies any  allergies. A wheal placed on L forearm and circled.  Pt instructed not to scratch  or rub area.  Pt was told to return in 88 - 72 hours for the test to be read and he was able to identify the dates and times that this time frame includes.

## 2018-07-22 ENCOUNTER — Ambulatory Visit: Payer: Medicare Other | Attending: Family Medicine | Admitting: *Deleted

## 2018-07-22 DIAGNOSIS — Z111 Encounter for screening for respiratory tuberculosis: Secondary | ICD-10-CM

## 2018-07-22 LAB — TB SKIN TEST
Induration: 0 mm
TB SKIN TEST: NEGATIVE

## 2018-07-22 NOTE — Progress Notes (Signed)
PPD Reading Note  PPD read and results entered in EpicCare.  Result: 0 mm induration.  Interpretation: negative  Allergic reaction: no

## 2018-07-29 DIAGNOSIS — E119 Type 2 diabetes mellitus without complications: Secondary | ICD-10-CM | POA: Diagnosis not present

## 2018-07-29 DIAGNOSIS — H2513 Age-related nuclear cataract, bilateral: Secondary | ICD-10-CM | POA: Diagnosis not present

## 2018-10-06 ENCOUNTER — Ambulatory Visit: Payer: Medicare Other | Attending: Family Medicine | Admitting: Family Medicine

## 2018-10-06 ENCOUNTER — Encounter: Payer: Self-pay | Admitting: Family Medicine

## 2018-10-06 VITALS — BP 144/77 | HR 76 | Temp 98.1°F | Ht 66.0 in | Wt 181.0 lb

## 2018-10-06 DIAGNOSIS — N529 Male erectile dysfunction, unspecified: Secondary | ICD-10-CM | POA: Diagnosis not present

## 2018-10-06 DIAGNOSIS — I10 Essential (primary) hypertension: Secondary | ICD-10-CM | POA: Insufficient documentation

## 2018-10-06 DIAGNOSIS — E782 Mixed hyperlipidemia: Secondary | ICD-10-CM | POA: Insufficient documentation

## 2018-10-06 DIAGNOSIS — Z794 Long term (current) use of insulin: Secondary | ICD-10-CM | POA: Diagnosis not present

## 2018-10-06 DIAGNOSIS — Z7982 Long term (current) use of aspirin: Secondary | ICD-10-CM | POA: Insufficient documentation

## 2018-10-06 DIAGNOSIS — E119 Type 2 diabetes mellitus without complications: Secondary | ICD-10-CM

## 2018-10-06 DIAGNOSIS — Z7984 Long term (current) use of oral hypoglycemic drugs: Secondary | ICD-10-CM | POA: Insufficient documentation

## 2018-10-06 DIAGNOSIS — Z79899 Other long term (current) drug therapy: Secondary | ICD-10-CM | POA: Insufficient documentation

## 2018-10-06 DIAGNOSIS — K219 Gastro-esophageal reflux disease without esophagitis: Secondary | ICD-10-CM | POA: Insufficient documentation

## 2018-10-06 LAB — POCT GLYCOSYLATED HEMOGLOBIN (HGB A1C): HbA1c, POC (controlled diabetic range): 6.5 % (ref 0.0–7.0)

## 2018-10-06 LAB — GLUCOSE, POCT (MANUAL RESULT ENTRY): POC GLUCOSE: 110 mg/dL — AB (ref 70–99)

## 2018-10-06 MED ORDER — SILDENAFIL CITRATE 50 MG PO TABS: 50.0000 mg | ORAL_TABLET | Freq: Every day | ORAL | 3 refills | Status: DC | PRN

## 2018-10-06 MED ORDER — PANTOPRAZOLE SODIUM 40 MG PO TBEC: 40.0000 mg | DELAYED_RELEASE_TABLET | Freq: Every day | ORAL | 1 refills | Status: DC

## 2018-10-06 MED ORDER — AMLODIPINE BESYLATE 10 MG PO TABS: 10.0000 mg | ORAL_TABLET | Freq: Every day | ORAL | 1 refills | Status: DC

## 2018-10-06 MED ORDER — METFORMIN HCL ER 500 MG PO TB24: 500.0000 mg | ORAL_TABLET | Freq: Every day | ORAL | 1 refills | Status: DC

## 2018-10-06 MED ORDER — ATORVASTATIN CALCIUM 40 MG PO TABS: 40.0000 mg | ORAL_TABLET | Freq: Every day | ORAL | 1 refills | Status: DC

## 2018-10-06 MED ORDER — LISINOPRIL-HYDROCHLOROTHIAZIDE 20-25 MG PO TABS: 1.0000 | ORAL_TABLET | Freq: Every day | ORAL | 1 refills | Status: DC

## 2018-10-06 MED FILL — SILDENAFIL CITRATE 50 MG TA: 50 | 15 days supply | Qty: 5 | Fill #0

## 2018-10-06 NOTE — Progress Notes (Signed)
Subjective:  Patient ID: Henry Walker, male    DOB: 27-Feb-1961  Age: 57 y.o. MRN: [id number]  CC: Hypertension and Diabetes   HPI Henry Walker is a 57 year old male with a history of type 2 diabetes mellitus (A1c 6.5), hypertension, hyperlipidemia, GERD who presents to the clinic for a follow-up visit. He complains of uncontrolled erectile dysfunction and has been unable to obtain Viagra from his pharmacy due to cost but would like to obtain the generic here as the medication assistance program for which he previously obtained Viagra was discontinued. He has been compliant with his diabetic medications and denies hypoglycemia, numbness in extremities and is up-to-date on his annual eye exams which he underwent with good eye [facility] in 04/2018. He has been compliant with his antihypertensive and his statin and denies myalgias or other adverse effects. [facility] [employer] and plans to exercise regularly. He has no additional concerns today.  Past Medical History:  Diagnosis Date  . Diabetes mellitus without complication ([place])   . GERD (gastroesophageal reflux disease)   . Hx of adenomatous colonic polyps 10/06/2014  . Hyperlipidemia   . Hypertension     Past Surgical History:  Procedure Laterality Date  . BACK SURGERY    . COLONOSCOPY      No Known Allergies   Outpatient Medications Prior to Visit  Medication Sig Dispense Refill  . glucose blood (ACCU-CHEK AVIVA PLUS) test strip Use as instructed 100 each 12  . glucose blood (ACCU-CHEK AVIVA) test strip Use as instructed 100 each 12  . Lancet Devices (ACCU-CHEK SOFTCLIX) lancets Use as instructed 1 each 0  . omega-3 acid ethyl esters (LOVAZA) 1 g capsule Take 1 capsule (1 g total) by mouth 2 (two) times daily. 60 capsule 5  . amLODipine (NORVASC) 10 MG tablet Take 1 tablet (10 mg total) by mouth daily. 90 tablet 1  . atorvastatin (LIPITOR) 40 MG tablet Take 1 tablet (40 mg total) by mouth daily. 90 tablet 1    . lisinopril-hydrochlorothiazide (PRINZIDE,ZESTORETIC) 20-25 MG tablet Take 1 tablet by mouth daily. 90 tablet 1  . metFORMIN (GLUCOPHAGE XR) 500 MG 24 hr tablet Take 1 tablet (500 mg total) by mouth daily with breakfast. 90 tablet 1  . pantoprazole (PROTONIX) 40 MG tablet Take 1 tablet (40 mg total) by mouth daily. 90 tablet 1  . sildenafil (VIAGRA) 50 MG tablet Take 1 tablet (50 mg total) by mouth daily as needed for erectile dysfunction. At least 24 hours between doses 10 tablet 3  . aspirin EC 81 MG tablet Take 1 tablet (81 mg total) by mouth daily. (Patient not taking: Reported on 01/23/2018) 30 tablet 11  . ofloxacin (FLOXIN) 0.3 % OTIC solution Place 10 drops daily into the left ear. For 7 days. (Patient not taking: Reported on 01/23/2018) 5 mL 0  . hydrALAZINE (APRESOLINE) 25 MG tablet Take 1 tablet (25 mg total) by mouth 3 (three) times daily. (Patient not taking: Reported on 10/06/2018) 270 tablet 1   Facility-Administered Medications Prior to Visit  Medication Dose Route Frequency Provider Last Rate Last Dose  . carbamide peroxide (DEBROX) 6.5 % otic solution 5 drop  5 drop Left EAR Once Langeland, Dawn T, MD        ROS Review of Systems  Constitutional: Negative for activity change and appetite change.  HENT: Negative for sinus pressure and sore throat.   Eyes: Negative for visual disturbance.  Respiratory: Negative for cough, chest tightness and shortness of breath.  Cardiovascular: Negative for chest pain and leg swelling.  Gastrointestinal: Negative for abdominal distention, abdominal pain, constipation and diarrhea.  Endocrine: Negative.   Genitourinary: Negative for dysuria.  Musculoskeletal: Negative for joint swelling and myalgias.  Skin: Negative for rash.  Allergic/Immunologic: Negative.   Neurological: Negative for weakness, light-headedness and numbness.  Psychiatric/Behavioral: Negative for dysphoric mood and suicidal ideas.    Objective:  BP (!) 144/77    Pulse 76   Temp 98.1 F (36.7 C) (Oral)   Ht 5\' 6"  (1.676 m)   Wt 181 lb (82.1 kg)   SpO2 97%   BMI 29.21 kg/m   BP/Weight 10/06/2018 06/19/2018 1/61/0960  Systolic BP 454 098 119  Diastolic BP 77 82 69  Wt. (Lbs) 181 163.4 175.6  BMI 29.21 26.37 28.34     Physical Exam  Constitutional: He is oriented to person, place, and time. He appears well-developed and well-nourished.  Cardiovascular: Normal rate, normal heart sounds and intact distal pulses.  No murmur heard. Pulmonary/Chest: Effort normal and breath sounds normal. He has no wheezes. He has no rales. He exhibits no tenderness.  Abdominal: Soft. Bowel sounds are normal. He exhibits no distension and no mass. There is no tenderness.  Musculoskeletal: Normal range of motion.  Neurological: He is alert and oriented to person, place, and time.  Skin: Skin is warm and dry.  Psychiatric: He has a normal mood and affect.     CMP Latest Ref Rng & Units 06/19/2018 01/23/2018 06/16/2017  Glucose 65 - 99 mg/dL 106(H) 98 78  BUN 6 - 24 mg/dL 17 17 24(H)  Creatinine 0.76 - 1.27 mg/dL 1.30(H) 1.38(H) 1.28(H)  Sodium 134 - 144 mmol/L 138 143 135  Potassium 3.5 - 5.2 mmol/L 3.9 4.1 3.4(L)  Chloride 96 - 106 mmol/L 99 102 100(L)  CO2 20 - 29 mmol/L 22 21 21(L)  Calcium 8.7 - 10.2 mg/dL 10.1 10.4(H) 9.8  Total Protein 6.0 - 8.5 g/dL 7.2 7.7 7.6  Total Bilirubin 0.0 - 1.2 mg/dL 0.5 0.5 0.6  Alkaline Phos 39 - 117 IU/L 61 63 50  AST 0 - 40 IU/L 20 22 35  ALT 0 - 44 IU/L 28 29 49    Lipid Panel     Component Value Date/Time   CHOL 153 01/23/2018 1038   TRIG 133 01/23/2018 1038   HDL 33 (L) 01/23/2018 1038   CHOLHDL 4.6 01/23/2018 1038   CHOLHDL 5.9 (H) 01/05/2016 1155   VLDL 30 01/05/2016 1155   LDLCALC 93 01/23/2018 1038    Lab Results  Component Value Date   HGBA1C 6.5 10/06/2018     Assessment & Plan:   1. Type 2 diabetes mellitus without complication, with long-term current use of insulin (HCC) Controlled Continue  current regimen - POCT glucose (manual entry) - POCT glycosylated hemoglobin (Hb A1C) - metFORMIN (GLUCOPHAGE XR) 500 MG 24 hr tablet; Take 1 tablet (500 mg total) by mouth daily with breakfast.  Dispense: 90 tablet; Refill: 1  2. Erectile dysfunction, unspecified erectile dysfunction type Uncontrolled Prescription has been sent to pharmacy in-house as he has been unable to obtain it from his regular pharmacy - sildenafil (VIAGRA) 50 MG tablet; Take 1 tablet (50 mg total) by mouth daily as needed for erectile dysfunction. At least 24 hours between doses  Dispense: 10 tablet; Refill: 3  3. Essential hypertension Slightly elevated above goal of less than 130/80 Lifestyle modifications - lisinopril-hydrochlorothiazide (PRINZIDE,ZESTORETIC) 20-25 MG tablet; Take 1 tablet by mouth daily.  Dispense: 90 tablet;  Refill: 1 - amLODipine (NORVASC) 10 MG tablet; Take 1 tablet (10 mg total) by mouth daily.  Dispense: 90 tablet; Refill: 1 - Basic Metabolic Panel  4. Mixed hyperlipidemia Stable - atorvastatin (LIPITOR) 40 MG tablet; Take 1 tablet (40 mg total) by mouth daily.  Dispense: 90 tablet; Refill: 1  5. Gastroesophageal reflux disease without esophagitis Controlled - pantoprazole (PROTONIX) 40 MG tablet; Take 1 tablet (40 mg total) by mouth daily.  Dispense: 90 tablet; Refill: 1   Meds ordered this encounter  Medications  . sildenafil (VIAGRA) 50 MG tablet    Sig: Take 1 tablet (50 mg total) by mouth daily as needed for erectile dysfunction. At least 24 hours between doses    Dispense:  10 tablet    Refill:  3  . metFORMIN (GLUCOPHAGE XR) 500 MG 24 hr tablet    Sig: Take 1 tablet (500 mg total) by mouth daily with breakfast.    Dispense:  90 tablet    Refill:  1  . lisinopril-hydrochlorothiazide (PRINZIDE,ZESTORETIC) 20-25 MG tablet    Sig: Take 1 tablet by mouth daily.    Dispense:  90 tablet    Refill:  1  . amLODipine (NORVASC) 10 MG tablet    Sig: Take 1 tablet (10 mg total) by  mouth daily.    Dispense:  90 tablet    Refill:  1  . atorvastatin (LIPITOR) 40 MG tablet    Sig: Take 1 tablet (40 mg total) by mouth daily.    Dispense:  90 tablet    Refill:  1  . pantoprazole (PROTONIX) 40 MG tablet    Sig: Take 1 tablet (40 mg total) by mouth daily.    Dispense:  90 tablet    Refill:  1    Follow-up: Return in about 6 months (around 04/07/2019) for follow up of chronic medical conditions.   Charlott Rakes MD

## 2018-10-07 LAB — BASIC METABOLIC PANEL
BUN/Creatinine Ratio: 12 (ref 9–20)
BUN: 15 mg/dL (ref 6–24)
CO2: 20 mmol/L (ref 20–29)
Calcium: 10.4 mg/dL — ABNORMAL HIGH (ref 8.7–10.2)
Chloride: 103 mmol/L (ref 96–106)
Creatinine, Ser: 1.24 mg/dL (ref 0.76–1.27)
GFR calc Af Amer: 74 mL/min/{1.73_m2} (ref >59)
GFR calc non Af Amer: 64 mL/min/{1.73_m2} (ref >59)
GLUCOSE: 84 mg/dL (ref 65–99)
Potassium: 4.3 mmol/L (ref 3.5–5.2)
Sodium: 141 mmol/L (ref 134–144)

## 2018-10-09 ENCOUNTER — Telehealth: Payer: Self-pay

## 2018-10-09 NOTE — Telephone Encounter (Signed)
Patient was called and a voicemail was left informing patient to return phone call for lab results. 

## 2018-10-09 NOTE — Telephone Encounter (Signed)
-----   Message from Charlott Rakes, MD sent at 10/07/2018  1:59 PM EST ----- Labs are stable

## 2018-10-13 NOTE — Telephone Encounter (Signed)
Patient was called and a voicemail was left informing patient to return phone call for lab results. 

## 2018-10-26 ENCOUNTER — Other Ambulatory Visit: Payer: Self-pay

## 2018-10-26 DIAGNOSIS — E782 Mixed hyperlipidemia: Secondary | ICD-10-CM

## 2018-10-26 MED ORDER — OMEGA-3-ACID ETHYL ESTERS 1 G PO CAPS: 1.0000 g | ORAL_CAPSULE | Freq: Two times a day (BID) | ORAL | 2 refills | Status: DC

## 2019-04-05 ENCOUNTER — Encounter: Payer: Self-pay | Admitting: Internal Medicine

## 2019-04-05 ENCOUNTER — Other Ambulatory Visit: Payer: Self-pay

## 2019-04-05 ENCOUNTER — Ambulatory Visit: Payer: Medicare Other | Attending: Family Medicine | Admitting: Family Medicine

## 2019-04-05 ENCOUNTER — Encounter: Payer: Self-pay | Admitting: Family Medicine

## 2019-04-05 DIAGNOSIS — E119 Type 2 diabetes mellitus without complications: Secondary | ICD-10-CM

## 2019-04-05 DIAGNOSIS — Z794 Long term (current) use of insulin: Secondary | ICD-10-CM

## 2019-04-05 DIAGNOSIS — Z1211 Encounter for screening for malignant neoplasm of colon: Secondary | ICD-10-CM | POA: Diagnosis not present

## 2019-04-05 DIAGNOSIS — E782 Mixed hyperlipidemia: Secondary | ICD-10-CM

## 2019-04-05 DIAGNOSIS — I10 Essential (primary) hypertension: Secondary | ICD-10-CM

## 2019-04-05 DIAGNOSIS — K219 Gastro-esophageal reflux disease without esophagitis: Secondary | ICD-10-CM | POA: Diagnosis not present

## 2019-04-05 MED ORDER — LISINOPRIL-HYDROCHLOROTHIAZIDE 20-25 MG PO TABS: 1.0000 | ORAL_TABLET | Freq: Every day | ORAL | 1 refills | Status: DC

## 2019-04-05 MED ORDER — ATORVASTATIN CALCIUM 40 MG PO TABS: 40.0000 mg | ORAL_TABLET | Freq: Every day | ORAL | 1 refills | Status: DC

## 2019-04-05 MED ORDER — PANTOPRAZOLE SODIUM 40 MG PO TBEC: 40.0000 mg | DELAYED_RELEASE_TABLET | Freq: Every day | ORAL | 1 refills | Status: DC

## 2019-04-05 MED ORDER — METFORMIN HCL ER 500 MG PO TB24: 500.0000 mg | ORAL_TABLET | Freq: Every day | ORAL | 1 refills | Status: DC

## 2019-04-05 MED ORDER — AMLODIPINE BESYLATE 10 MG PO TABS: 10.0000 mg | ORAL_TABLET | Freq: Every day | ORAL | 1 refills | Status: DC

## 2019-04-05 NOTE — Patient Instructions (Signed)
Diabetes Mellitus and Nutrition, Adult  When you have diabetes (diabetes mellitus), it is very important to have healthy eating habits because your blood sugar (glucose) levels are greatly affected by what you eat and drink. Eating healthy foods in the appropriate amounts, at about the same times every day, can help you:  · Control your blood glucose.  · Lower your risk of heart disease.  · Improve your blood pressure.  · Reach or maintain a healthy weight.  Every person with diabetes is different, and each person has different needs for a meal plan. Your health care provider may recommend that you work with a diet and nutrition specialist (dietitian) to make a meal plan that is best for you. Your meal plan may vary depending on factors such as:  · The calories you need.  · The medicines you take.  · Your weight.  · Your blood glucose, blood pressure, and cholesterol levels.  · Your activity level.  · Other health conditions you have, such as heart or kidney disease.  How do carbohydrates affect me?  Carbohydrates, also called carbs, affect your blood glucose level more than any other type of food. Eating carbs naturally raises the amount of glucose in your blood. Carb counting is a method for keeping track of how many carbs you eat. Counting carbs is important to keep your blood glucose at a healthy level, especially if you use insulin or take certain oral diabetes medicines.  It is important to know how many carbs you can safely have in each meal. This is different for every person. Your dietitian can help you calculate how many carbs you should have at each meal and for each snack.  Foods that contain carbs include:  · Bread, cereal, rice, pasta, and crackers.  · Potatoes and corn.  · Peas, beans, and lentils.  · Milk and yogurt.  · Fruit and juice.  · Desserts, such as cakes, cookies, ice cream, and candy.  How does alcohol affect me?  Alcohol can cause a sudden decrease in blood glucose (hypoglycemia),  especially if you use insulin or take certain oral diabetes medicines. Hypoglycemia can be a life-threatening condition. Symptoms of hypoglycemia (sleepiness, dizziness, and confusion) are similar to symptoms of having too much alcohol.  If your health care provider says that alcohol is safe for you, follow these guidelines:  · Limit alcohol intake to no more than 1 drink per day for nonpregnant women and 2 drinks per day for men. One drink equals 12 oz of beer, 5 oz of wine, or 1½ oz of hard liquor.  · Do not drink on an empty stomach.  · Keep yourself hydrated with water, diet soda, or unsweetened iced tea.  · Keep in mind that regular soda, juice, and other mixers may contain a lot of sugar and must be counted as carbs.  What are tips for following this plan?    Reading food labels  · Start by checking the serving size on the "Nutrition Facts" label of packaged foods and drinks. The amount of calories, carbs, fats, and other nutrients listed on the label is based on one serving of the item. Many items contain more than one serving per package.  · Check the total grams (g) of carbs in one serving. You can calculate the number of servings of carbs in one serving by dividing the total carbs by 15. For example, if a food has 30 g of total carbs, it would be equal to 2   servings of carbs.  · Check the number of grams (g) of saturated and trans fats in one serving. Choose foods that have low or no amount of these fats.  · Check the number of milligrams (mg) of salt (sodium) in one serving. Most people should limit total sodium intake to less than 2,300 mg per day.  · Always check the nutrition information of foods labeled as "low-fat" or "nonfat". These foods may be higher in added sugar or refined carbs and should be avoided.  · Talk to your dietitian to identify your daily goals for nutrients listed on the label.  Shopping  · Avoid buying canned, premade, or processed foods. These foods tend to be high in fat, sodium,  and added sugar.  · Shop around the outside edge of the grocery store. This includes fresh fruits and vegetables, bulk grains, fresh meats, and fresh dairy.  Cooking  · Use low-heat cooking methods, such as baking, instead of high-heat cooking methods like deep frying.  · Cook using healthy oils, such as olive, canola, or sunflower oil.  · Avoid cooking with butter, cream, or high-fat meats.  Meal planning  · Eat meals and snacks regularly, preferably at the same times every day. Avoid going long periods of time without eating.  · Eat foods high in fiber, such as fresh fruits, vegetables, beans, and whole grains. Talk to your dietitian about how many servings of carbs you can eat at each meal.  · Eat 4-6 ounces (oz) of lean protein each day, such as lean meat, chicken, fish, eggs, or tofu. One oz of lean protein is equal to:  ? 1 oz of meat, chicken, or fish.  ? 1 egg.  ? ¼ cup of tofu.  · Eat some foods each day that contain healthy fats, such as avocado, nuts, seeds, and fish.  Lifestyle  · Check your blood glucose regularly.  · Exercise regularly as told by your health care provider. This may include:  ? 150 minutes of moderate-intensity or vigorous-intensity exercise each week. This could be brisk walking, biking, or water aerobics.  ? Stretching and doing strength exercises, such as yoga or weightlifting, at least 2 times a week.  · Take medicines as told by your health care provider.  · Do not use any products that contain nicotine or tobacco, such as cigarettes and e-cigarettes. If you need help quitting, ask your health care provider.  · Work with a counselor or diabetes educator to identify strategies to manage stress and any emotional and social challenges.  Questions to ask a health care provider  · Do I need to meet with a diabetes educator?  · Do I need to meet with a dietitian?  · What number can I call if I have questions?  · When are the best times to check my blood glucose?  Where to find more  information:  · American Diabetes Association: diabetes.org  · Academy of Nutrition and Dietetics: www.eatright.org  · National Institute of Diabetes and Digestive and Kidney Diseases (NIH): www.niddk.nih.gov  Summary  · A healthy meal plan will help you control your blood glucose and maintain a healthy lifestyle.  · Working with a diet and nutrition specialist (dietitian) can help you make a meal plan that is best for you.  · Keep in mind that carbohydrates (carbs) and alcohol have immediate effects on your blood glucose levels. It is important to count carbs and to use alcohol carefully.  This information is not intended to   replace advice given to you by your health care provider. Make sure you discuss any questions you have with your health care provider.  Document Released: 07/11/2005 Document Revised: 05/14/2017 Document Reviewed: 11/18/2016  Elsevier Interactive Patient Education © 2019 Elsevier Inc.

## 2019-04-05 NOTE — Progress Notes (Signed)
Patient has been called and DOB has been verified. Patient has been screened and transferred to PCP to start phone visit.     

## 2019-04-05 NOTE — Progress Notes (Signed)
Virtual Visit via Telephone Note  I connected with [employer], on 04/05/2019 at 9:15am by telephone due to the COVID-19 pandemic and verified that I am speaking with the correct person using two identifiers.   Consent: I discussed the limitations, risks, security and privacy concerns of performing an evaluation and management service by telephone and the availability of in person appointments. I also discussed with the patient that there may be a patient responsible charge related to this service. The patient expressed understanding and agreed to proceed.   Location of Patient: Home  Location of Provider: Clinic   Persons participating in Telemedicine visit: Herson Orlando Haque Elmo Putt [place]-CMA Dr. Felecia Shelling     History of Present Illness: Henry Walker is a 58 year old male with a history of type 2 diabetes mellitus (A1c 6.5), hypertension, hyperlipidemia, GERD who presents for follow-up visit. His A1c in 09/2018 was 6.5 and he denies hypoglycemic episodes, numbness in extremities.  Fasting sugars have ranged between 110 and 120 and his last eye exam was in 09/2018.  He exercises by means of walking and is compliant with a diabetic diet. Compliant with his antihypertensive with no complaints of adverse effects and he is doing well on his statin with no complaints of myalgias. He denies chest pain, dyspnea. Reflux symptoms are controlled on his PPI. Denies abdominal pain, diarrhea, constipation.   Past Medical History:  Diagnosis Date  . Diabetes mellitus without complication ([place])   . GERD (gastroesophageal reflux disease)   . Hx of adenomatous colonic polyps 10/06/2014  . Hyperlipidemia   . Hypertension    No Known Allergies  Current Outpatient Medications on File Prior to Visit  Medication Sig Dispense Refill  . glucose blood (ACCU-CHEK AVIVA PLUS) test strip Use as instructed 100 each 12  . glucose blood (ACCU-CHEK AVIVA) test strip Use as  instructed 100 each 12  . Lancet Devices (ACCU-CHEK SOFTCLIX) lancets Use as instructed 1 each 0  . omega-3 acid ethyl esters (LOVAZA) 1 g capsule Take 1 capsule (1 g total) by mouth 2 (two) times daily. 60 capsule 2  . sildenafil (VIAGRA) 50 MG tablet Take 1 tablet (50 mg total) by mouth daily as needed for erectile dysfunction. At least 24 hours between doses 10 tablet 3  . aspirin EC 81 MG tablet Take 1 tablet (81 mg total) by mouth daily. (Patient not taking: Reported on 01/23/2018) 30 tablet 11  . ofloxacin (FLOXIN) 0.3 % OTIC solution Place 10 drops daily into the left ear. For 7 days. (Patient not taking: Reported on 01/23/2018) 5 mL 0  . [DISCONTINUED] tadalafil (CIALIS) 20 MG tablet Take 0.5-1 tablets (10-20 mg total) by mouth every other day as needed for erectile dysfunction. (Patient not taking: Reported on 09/19/2014) 5 tablet 3   Current Facility-Administered Medications on File Prior to Visit  Medication Dose Route Frequency Provider Last Rate Last Dose  . carbamide peroxide (DEBROX) 6.5 % otic solution 5 drop  5 drop Left EAR Once Lottie Mussel T, MD        Observations/Objective: Awake, alert, oriented x3 Not in acute distress  CMP Latest Ref Rng & Units 10/06/2018 06/19/2018 01/23/2018  Glucose 65 - 99 mg/dL 84 106(H) 98  BUN 6 - 24 mg/dL 15 17 17   Creatinine 0.76 - 1.27 mg/dL 1.24 1.30(H) 1.38(H)  Sodium 134 - 144 mmol/L 141 138 143  Potassium 3.5 - 5.2 mmol/L 4.3 3.9 4.1  Chloride 96 - 106 mmol/L 103 99 102  CO2 20 -  29 mmol/L 20 22 21   Calcium 8.7 - 10.2 mg/dL 10.4(H) 10.1 10.4(H)  Total Protein 6.0 - 8.5 g/dL - 7.2 7.7  Total Bilirubin 0.0 - 1.2 mg/dL - 0.5 0.5  Alkaline Phos 39 - 117 IU/L - 61 63  AST 0 - 40 IU/L - 20 22  ALT 0 - 44 IU/L - 28 29    Lipid Panel     Component Value Date/Time   CHOL 153 01/23/2018 1038   TRIG 133 01/23/2018 1038   HDL 33 (L) 01/23/2018 1038   CHOLHDL 4.6 01/23/2018 1038   CHOLHDL 5.9 (H) 01/05/2016 1155   VLDL 30 01/05/2016  1155   LDLCALC 93 01/23/2018 1038    Lab Results  Component Value Date   HGBA1C 6.5 10/06/2018    Assessment and Plan: 1. Type 2 diabetes mellitus without complication, with long-term current use of insulin (HCC) Controlled with A1c of 6.5 Counseled on Diabetic diet, my plate method, 462 minutes of moderate intensity exercise/week Keep blood sugar logs with fasting goals of 80-120 mg/dl, random of less than 180 and in the event of sugars less than 60 mg/dl or greater than 400 mg/dl please notify the clinic ASAP. It is recommended that you undergo annual eye exams and annual foot exams. Pneumonia vaccine is recommended. - metFORMIN (GLUCOPHAGE XR) 500 MG 24 hr tablet; Take 1 tablet (500 mg total) by mouth daily with breakfast.  Dispense: 90 tablet; Refill: 1  2. Mixed hyperlipidemia Controlled Low-cholesterol diet - atorvastatin (LIPITOR) 40 MG tablet; Take 1 tablet (40 mg total) by mouth daily.  Dispense: 90 tablet; Refill: 1  3. Essential hypertension Stable Counseled on blood pressure goal of less than 130/80, low-sodium, DASH diet, medication compliance, 150 minutes of moderate intensity exercise per week. Discussed medication compliance, adverse effects. - amLODipine (NORVASC) 10 MG tablet; Take 1 tablet (10 mg total) by mouth daily.  Dispense: 90 tablet; Refill: 1 - lisinopril-hydrochlorothiazide (ZESTORETIC) 20-25 MG tablet; Take 1 tablet by mouth daily.  Dispense: 90 tablet; Refill: 1  4. Gastroesophageal reflux disease without esophagitis Controlled - pantoprazole (PROTONIX) 40 MG tablet; Take 1 tablet (40 mg total) by mouth daily.  Dispense: 90 tablet; Refill: 1  5. Screening for colon cancer - Ambulatory referral to Gastroenterology   Follow Up Instructions: 3 months-call for an appointment   I discussed the assessment and treatment plan with the patient. The patient was provided an opportunity to ask questions and all were answered. The patient agreed with the plan  and demonstrated an understanding of the instructions.   The patient was advised to call back or seek an in-person evaluation if the symptoms worsen or if the condition fails to improve as anticipated.     I provided 16 minutes total of non-face-to-face time during this encounter including median intraservice time, reviewing previous notes, labs, imaging, medications, management and patient verbalized understanding.     Charlott Rakes, MD, FAAFP. [facility], [place]   04/05/2019, 9:58 AM

## 2019-04-20 ENCOUNTER — Other Ambulatory Visit: Payer: Self-pay | Admitting: Family Medicine

## 2019-04-20 DIAGNOSIS — E119 Type 2 diabetes mellitus without complications: Secondary | ICD-10-CM

## 2019-04-20 DIAGNOSIS — K219 Gastro-esophageal reflux disease without esophagitis: Secondary | ICD-10-CM

## 2019-04-20 DIAGNOSIS — I10 Essential (primary) hypertension: Secondary | ICD-10-CM

## 2019-04-21 ENCOUNTER — Other Ambulatory Visit: Payer: Self-pay

## 2019-04-21 NOTE — Patient Outreach (Signed)
[facility]) Care Management  04/21/2019  Henry Walker 11-Jun-1961 [id number]   Medication Adherence call to [name] Verify spoke with patient he is past due on Metformin Er 500 mg and Lisinopril/Hctz 20/25 patient explain he is taking 1 tablet daily and just pick up both medication from [facility] a couple of days ago. Henry Walker is showing past due under [place].   [facility] Management Direct Dial [phone]  Fax [phone] [email or web address]

## 2019-05-05 ENCOUNTER — Other Ambulatory Visit: Payer: Self-pay

## 2019-05-05 ENCOUNTER — Ambulatory Visit (AMBULATORY_SURGERY_CENTER): Payer: Medicare Other | Admitting: *Deleted

## 2019-05-05 VITALS — Ht 66.0 in | Wt 175.0 lb

## 2019-05-05 DIAGNOSIS — Z86010 Personal history of colonic polyps: Secondary | ICD-10-CM

## 2019-05-05 NOTE — Progress Notes (Signed)
No egg or soy allergy known to patient  No issues with past sedation with any surgeries  or procedures, no intubation problems  No diet pills per patient No home 02 use per patient  No blood thinners per patient  Pt denies issues with constipation  No A fib or A flutter  EMMI video sent to pt's e mail  Pt will bring his [place] Medicare card by to be scanned in the system today per pt- he is aware to wear a mask and go to the 3rd floor to have the card scanned   Pt verified name, DOB, address and insurance during PV today. Pt mailed instruction packet to included paper to complete and mail back to [facility] with addressed and stamped envelope, Emmi video, copy of consent form to read and not return, and instructions. PV completed over the phone. Pt encouraged to call with questions or issues   Pt is aware that care partner will wait in the car during proceudre; if they feel like they will be too hot to wait in the car; they may wait in the lobby.  We want them to wear a mask (we do not have any that we can provide them), practice social distancing, and we will check their temperatures when they get here.  I did remind patient that their care partner needs to stay in the parking lot the entire time. Pt will wear mask into building.

## 2019-05-06 ENCOUNTER — Encounter: Payer: Self-pay | Admitting: Internal Medicine

## 2019-05-18 ENCOUNTER — Telehealth: Payer: Self-pay | Admitting: Internal Medicine

## 2019-05-18 NOTE — Telephone Encounter (Signed)

## 2019-05-19 ENCOUNTER — Encounter: Payer: Self-pay | Admitting: Internal Medicine

## 2019-05-19 ENCOUNTER — Other Ambulatory Visit: Payer: Self-pay

## 2019-05-19 ENCOUNTER — Ambulatory Visit (AMBULATORY_SURGERY_CENTER): Payer: Medicare Other | Admitting: Internal Medicine

## 2019-05-19 VITALS — BP 106/68 | HR 65 | Temp 98.3°F | Resp 20 | Ht 66.0 in | Wt 181.0 lb

## 2019-05-19 DIAGNOSIS — K635 Polyp of colon: Secondary | ICD-10-CM

## 2019-05-19 DIAGNOSIS — D125 Benign neoplasm of sigmoid colon: Secondary | ICD-10-CM

## 2019-05-19 DIAGNOSIS — Z86010 Personal history of colonic polyps: Secondary | ICD-10-CM | POA: Diagnosis not present

## 2019-05-19 HISTORY — PX: COLONOSCOPY WITH PROPOFOL: SHX5780

## 2019-05-19 MED ORDER — SODIUM CHLORIDE 0.9 % IV SOLN: 500.0000 mL | Freq: Once | INTRAVENOUS | Status: DC

## 2019-05-19 NOTE — Patient Instructions (Addendum)
I found and removed 2 tiny polyps.  Also saw swollen hemorrhoids which are really normal  findings and not usually a problem.  I will let you know pathology results and when to have another routine colonoscopy by mail and/or My Chart.  HAVE A GREAT BIRTHDAY NEXT WEEK!  I appreciate the opportunity to care for you. Gatha Mayer, MD, FACG YOU HAD AN ENDOSCOPIC PROCEDURE TODAY AT [facility]:   Refer to the procedure report that was given to you for any specific questions about what was found during the examination.  If the procedure report does not answer your questions, please call your gastroenterologist to clarify.  If you requested that your care partner not be given the details of your procedure findings, then the procedure report has been included in a sealed envelope for you to review at your convenience later.  YOU SHOULD EXPECT: Some feelings of bloating in the abdomen. Passage of more gas than usual.  Walking can help get rid of the air that was put into your GI tract during the procedure and reduce the bloating. If you had a lower endoscopy (such as a colonoscopy or flexible sigmoidoscopy) you may notice spotting of blood in your stool or on the toilet paper. If you underwent a bowel prep for your procedure, you may not have a normal bowel movement for a few days.  Please Note:  You might notice some irritation and congestion in your nose or some drainage.  This is from the oxygen used during your procedure.  There is no need for concern and it should clear up in a day or so.  SYMPTOMS TO REPORT IMMEDIATELY:   Following lower endoscopy (colonoscopy or flexible sigmoidoscopy):  Excessive amounts of blood in the stool  Significant tenderness or worsening of abdominal pains  Swelling of the abdomen that is new, acute  Fever of 100F or higher   For urgent or emergent issues, a gastroenterologist can be reached at any hour by calling [phone].    DIET:  We do recommend a small meal at first, but then you may proceed to your regular diet.  Drink plenty of fluids but you should avoid alcoholic beverages for 24 hours.  MEDICATIONS: Continue present medications.  Please see handouts given to you by your recovery nurse.  ACTIVITY:  You should plan to take it easy for the rest of today and you should NOT DRIVE or use heavy machinery until tomorrow (because of the sedation medicines used during the test).    FOLLOW UP: Our staff will call the number listed on your records 48-72 hours following your procedure to check on you and address any questions or concerns that you may have regarding the information given to you following your procedure. If we do not reach you, we will leave a message.  We will attempt to reach you two times.  During this call, we will ask if you have developed any symptoms of COVID 19. If you develop any symptoms (ie: fever, flu-like symptoms, shortness of breath, cough etc.) before then, please call [phone].  If you test positive for Covid 19 in the 2 weeks post procedure, please call and report this information to [place].    If any biopsies were taken you will be contacted by phone or by letter within the next 1-3 weeks.  Please call us at [phone] if you have not heard about the biopsies in 3 weeks.   Thank you for allowing  [place] to provide for your healthcare needs today.   SIGNATURES/CONFIDENTIALITY: You and/or your care partner have signed paperwork which will be entered into your electronic medical record.  These signatures attest to the fact that that the information above on your After Visit Summary has been reviewed and is understood.  Full responsibility of the confidentiality of this discharge information lies with you and/or your care-partner.

## 2019-05-19 NOTE — Progress Notes (Signed)
Report given to PACU, vss 

## 2019-05-19 NOTE — Op Note (Signed)
[facility] Patient Name: Henry Walker Procedure Date: 05/19/2019 11:04 AM MRN: [id number] Endoscopist: Gatha Mayer , MD Age: 58 Referring MD:  Date of Birth: 06/30/1961 Gender: Male Account #: [id number] Procedure:                Colonoscopy Indications:              Surveillance: Personal history of adenomatous                            polyps on last colonoscopy > 3 years ago Medicines:                Propofol per Anesthesia, Monitored Anesthesia Care Procedure:                After obtaining informed consent, the colonoscope                            was passed under direct vision. Throughout the                            procedure, the patient's blood pressure, pulse, and                            oxygen saturations were monitored continuously. The                            Colonoscope was introduced through the anus and                            advanced to the the cecum, identified by                            appendiceal orifice and ileocecal valve. The                            colonoscopy was performed without difficulty. The                            patient tolerated the procedure well. The quality                            of the bowel preparation was good. The bowel                            preparation used was Miralax via split dose                            instruction. The ileocecal valve, appendiceal                            orifice, and rectum were photographed. Scope In: 11:14:54 AM Scope Out: 11:29:30 AM Scope Withdrawal Time: 0 hours 12 minutes 25 seconds  Total Procedure Duration: 0 hours 14 minutes 36 seconds  Findings:                 The perianal and digital  rectal examinations were                            normal. Pertinent negatives include normal prostate                            (size, shape, and consistency).                           Two sessile polyps were found in the sigmoid colon.                            The  polyps were diminutive in size. These polyps                            were removed with a cold snare. Resection and                            retrieval were complete. Verification of patient                            identification for the specimen was done. Estimated                            blood loss was minimal.                           External hemorrhoids were found.                           The exam was otherwise without abnormality on                            direct and retroflexion views. Complications:            No immediate complications. Estimated Blood Loss:     Estimated blood loss was minimal. Impression:               - Two diminutive polyps in the sigmoid colon,                            removed with a cold snare. Resected and retrieved.                           - External hemorrhoids.                           - The examination was otherwise normal on direct                            and retroflexion views.                           - Personal history of colonic polyps. 5 adenomas  max 15 mm 2015 Recommendation:           - Patient has a contact number available for                            emergencies. The signs and symptoms of potential                            delayed complications were discussed with the                            patient. Return to normal activities tomorrow.                            Written discharge instructions were provided to the                            patient.                           - Resume previous diet.                           - Continue present medications.                           - Repeat colonoscopy is recommended for                            surveillance. The colonoscopy date will be                            determined after pathology results from today's                            exam become available for review. Gatha Mayer, MD 05/19/2019 11:37:31 AM This report has  been signed electronically.

## 2019-05-19 NOTE — Progress Notes (Signed)
Pt's states no medical or surgical changes since previsit or office visit.  Vs in admitting by Rica Mote  covid-19 screeing & temp by Loma Sousa @ front desk

## 2019-05-19 NOTE — Progress Notes (Signed)
Called to room to assist during endoscopic procedure.  Patient ID and intended procedure confirmed with present staff. Received instructions for my participation in the procedure from the performing physician.  

## 2019-05-23 ENCOUNTER — Encounter: Payer: Self-pay | Admitting: Internal Medicine

## 2019-05-23 DIAGNOSIS — Z86010 Personal history of colonic polyps: Secondary | ICD-10-CM

## 2019-05-23 NOTE — Progress Notes (Signed)
Distal hyperplastic x 2 Prior advanced adenomas 2015 Recall 2025

## 2019-06-11 ENCOUNTER — Other Ambulatory Visit: Payer: Self-pay | Admitting: Family Medicine

## 2019-06-11 DIAGNOSIS — E782 Mixed hyperlipidemia: Secondary | ICD-10-CM

## 2019-07-06 ENCOUNTER — Ambulatory Visit: Payer: Medicare Other | Attending: Family Medicine | Admitting: Family Medicine

## 2019-07-06 ENCOUNTER — Encounter: Payer: Self-pay | Admitting: Family Medicine

## 2019-07-06 ENCOUNTER — Other Ambulatory Visit: Payer: Self-pay

## 2019-07-06 DIAGNOSIS — E782 Mixed hyperlipidemia: Secondary | ICD-10-CM

## 2019-07-06 DIAGNOSIS — E119 Type 2 diabetes mellitus without complications: Secondary | ICD-10-CM | POA: Diagnosis not present

## 2019-07-06 DIAGNOSIS — Z794 Long term (current) use of insulin: Secondary | ICD-10-CM

## 2019-07-06 DIAGNOSIS — F17210 Nicotine dependence, cigarettes, uncomplicated: Secondary | ICD-10-CM

## 2019-07-06 DIAGNOSIS — I10 Essential (primary) hypertension: Secondary | ICD-10-CM | POA: Diagnosis not present

## 2019-07-06 DIAGNOSIS — F17200 Nicotine dependence, unspecified, uncomplicated: Secondary | ICD-10-CM

## 2019-07-06 DIAGNOSIS — K219 Gastro-esophageal reflux disease without esophagitis: Secondary | ICD-10-CM | POA: Diagnosis not present

## 2019-07-06 MED ORDER — METFORMIN HCL ER 500 MG PO TB24: 500.0000 mg | ORAL_TABLET | Freq: Every day | ORAL | 1 refills | Status: DC

## 2019-07-06 MED ORDER — ATORVASTATIN CALCIUM 40 MG PO TABS: 40.0000 mg | ORAL_TABLET | Freq: Every day | ORAL | 1 refills | Status: DC

## 2019-07-06 MED ORDER — ONETOUCH ULTRA VI STRP: ORAL_STRIP | 12 refills | Status: AC

## 2019-07-06 MED ORDER — ONETOUCH ULTRA 2 W/DEVICE KIT: PACK | 0 refills | Status: AC

## 2019-07-06 MED ORDER — ONETOUCH ULTRASOFT LANCETS MISC: 12 refills | Status: AC

## 2019-07-06 MED ORDER — PANTOPRAZOLE SODIUM 40 MG PO TBEC: 40.0000 mg | DELAYED_RELEASE_TABLET | Freq: Every day | ORAL | 1 refills | Status: DC

## 2019-07-06 MED ORDER — AMLODIPINE BESYLATE 10 MG PO TABS: 10.0000 mg | ORAL_TABLET | Freq: Every day | ORAL | 1 refills | Status: DC

## 2019-07-06 MED ORDER — LISINOPRIL-HYDROCHLOROTHIAZIDE 20-25 MG PO TABS: 1.0000 | ORAL_TABLET | Freq: Every day | ORAL | 1 refills | Status: DC

## 2019-07-06 NOTE — Progress Notes (Signed)
Virtual Visit via Telephone Note  I connected with [employer], on 07/06/2019 at 8:58 AM by telephone due to the COVID-19 pandemic and verified that I am speaking with the correct person using two identifiers.   Consent: I discussed the limitations, risks, security and privacy concerns of performing an evaluation and management service by telephone and the availability of in person appointments. I also discussed with the patient that there may be a patient responsible charge related to this service. The patient expressed understanding and agreed to proceed.   Location of Patient: Home  Location of Provider: Clinic   Persons participating in Telemedicine visit: Deyton Orlando Kaufhold Elmo Putt [place]-CMA Dr. Felecia Shelling     History of Present Illness: Henry Walker is a 58 year old male with a history of type 2 diabetes mellitus (A1c 6.5), hypertension, hyperlipidemia, GERD, tobacco abuse who presents for follow-up visit.  He smokes half a pack of cigarettes per day and has smoked since he was 58 years of age; he is trying to quit on his own. With regards to his diabetes mellitus he has not been checking his blood sugars as his meter recently broke.  Denies hypoglycemic episodes, numbness in extremities and is up-to-date on his annual eye exam with last eye exam in 09/2018 with [facility]. His blood pressure has been controlled and he is compliant with his antihypertensive.  Also compliant with his statin and denies adverse effects of his medications. He remains on a PPI for control of his reflux. Denies chest pain, dyspnea and has no additional concerns. He needs a flu shot.  Past Medical History:  Diagnosis Date  . Diabetes mellitus without complication ([place])   . GERD (gastroesophageal reflux disease)   . Hx of adenomatous colonic polyps 10/06/2014  . Hyperlipidemia   . Hypertension    No Known Allergies  Current Outpatient Medications on File Prior to  Visit  Medication Sig Dispense Refill  . amLODipine (NORVASC) 10 MG tablet Take 1 tablet (10 mg total) by mouth daily. 90 tablet 1  . atorvastatin (LIPITOR) 40 MG tablet Take 1 tablet (40 mg total) by mouth daily. 90 tablet 1  . glucose blood (ACCU-CHEK AVIVA PLUS) test strip Use as instructed 100 each 12  . glucose blood (ACCU-CHEK AVIVA) test strip Use as instructed 100 each 12  . Lancet Devices (ACCU-CHEK SOFTCLIX) lancets Use as instructed 1 each 0  . lisinopril-hydrochlorothiazide (ZESTORETIC) 20-25 MG tablet Take 1 tablet by mouth daily. 90 tablet 1  . metFORMIN (GLUCOPHAGE XR) 500 MG 24 hr tablet Take 1 tablet (500 mg total) by mouth daily with breakfast. 90 tablet 1  . omega-3 acid ethyl esters (LOVAZA) 1 g capsule TAKE 1 CAPSULE BY MOUTH TWICE DAILY 60 capsule 2  . pantoprazole (PROTONIX) 40 MG tablet Take 1 tablet (40 mg total) by mouth daily. 90 tablet 1  . aspirin EC 81 MG tablet Take 1 tablet (81 mg total) by mouth daily. (Patient not taking: Reported on 01/23/2018) 30 tablet 11  . ofloxacin (FLOXIN) 0.3 % OTIC solution Place 10 drops daily into the left ear. For 7 days. (Patient not taking: Reported on 01/23/2018) 5 mL 0  . sildenafil (VIAGRA) 50 MG tablet Take 1 tablet (50 mg total) by mouth daily as needed for erectile dysfunction. At least 24 hours between doses (Patient not taking: Reported on 07/06/2019) 10 tablet 3  . [DISCONTINUED] tadalafil (CIALIS) 20 MG tablet Take 0.5-1 tablets (10-20 mg total) by mouth every other day as needed  for erectile dysfunction. (Patient not taking: Reported on 09/19/2014) 5 tablet 3   Current Facility-Administered Medications on File Prior to Visit  Medication Dose Route Frequency Provider Last Rate Last Dose  . 0.9 %  sodium chloride infusion  500 mL Intravenous Once Gatha Mayer, MD      . carbamide peroxide (DEBROX) 6.5 % otic solution 5 drop  5 drop Left EAR Once Lottie Mussel T, MD        Observations/Objective: Awake, alert, oriented  x3 Not in acute distress  CMP Latest Ref Rng & Units 10/06/2018 06/19/2018 01/23/2018  Glucose 65 - 99 mg/dL 84 106(H) 98  BUN 6 - 24 mg/dL _0 Creatinine 0.76 - 1.27 mg/dL 1.24 1.30(H) 1.38(H)  Sodium 134 - 144 mmol/L 141 138 143  Potassium 3.5 - 5.2 mmol/L 4.3 3.9 4.1  Chloride 96 - 106 mmol/L 103 99 102  CO2 20 - 29 mmol/L _1 Calcium 8.7 - 10.2 mg/dL 10.4(H) 10.1 10.4(H)  Total Protein 6.0 - 8.5 g/dL - 7.2 7.7  Total Bilirubin 0.0 - 1.2 mg/dL - 0.5 0.5  Alkaline Phos 39 - 117 IU/L - 61 63  AST 0 - 40 IU/L - 20 22  ALT 0 - 44 IU/L - 28 29    Lipid Panel     Component Value Date/Time   CHOL 153 01/23/2018 1038   TRIG 133 01/23/2018 1038   HDL 33 (L) 01/23/2018 1038   CHOLHDL 4.6 01/23/2018 1038   CHOLHDL 5.9 (H) 01/05/2016 1155   VLDL 30 01/05/2016 1155   LDLCALC 93 01/23/2018 1038    Lab Results  Component Value Date   HGBA1C 6.5 10/06/2018    Assessment and Plan: 1. Type 2 diabetes mellitus without complication, with long-term current use of insulin (HCC) Controlled with A1c of 6.5 Counseled on Diabetic diet, my plate method, 244 minutes of moderate intensity exercise/week Keep blood sugar logs with fasting goals of 80-120 mg/dl, random of less than 180 and in the event of sugars less than 60 mg/dl or greater than 400 mg/dl please notify the clinic ASAP. It is recommended that you undergo annual eye exams and annual foot exams. Pneumonia vaccine is recommended. Last eye exam-09/2018 at [facility] exam due at next visit - Hemoglobin A1c; Future - CMP14+EGFR; Future - Lipid panel; Future - Lancets (ONETOUCH ULTRASOFT) lancets; Use as instructed 3 times daily  Dispense: 100 each; Refill: 12 - Blood Glucose Monitoring Suppl (ONE TOUCH ULTRA 2) w/Device KIT; Use as directed 3 times daily before meals.  Dispense: 1 kit; Refill: 0 - glucose blood (ONETOUCH ULTRA) test strip; Use as instructed 3 times daily  Dispense: 100 each; Refill: 12 -  Microalbumin/Creatinine Ratio, Urine; Future - metFORMIN (GLUCOPHAGE XR) 500 MG 24 hr tablet; Take 1 tablet (500 mg total) by mouth daily with breakfast.  Dispense: 90 tablet; Refill: 1  2. Mixed hyperlipidemia Controlled Low-cholesterol diet - atorvastatin (LIPITOR) 40 MG tablet; Take 1 tablet (40 mg total) by mouth daily.  Dispense: 90 tablet; Refill: 1  3. Gastroesophageal reflux disease without esophagitis Controlled - pantoprazole (PROTONIX) 40 MG tablet; Take 1 tablet (40 mg total) by mouth daily.  Dispense: 90 tablet; Refill: 1  4. Essential hypertension Controlled Counseled on blood pressure goal of less than 130/80, low-sodium, DASH diet, medication compliance, 150 minutes of moderate intensity exercise per week. Discussed medication compliance, adverse effects. - amLODipine (NORVASC) 10 MG tablet; Take 1 tablet (10 mg total) by mouth  daily.  Dispense: 90 tablet; Refill: 1 - lisinopril-hydrochlorothiazide (ZESTORETIC) 20-25 MG tablet; Take 1 tablet by mouth daily.  Dispense: 90 tablet; Refill: 1  5. Tobacco use disorder Spent 5 minutes counseling on smoking cessation.  He has been smoking about half a pack since the age of 74 years. Does not meet criteria for lung cancer screen at this time Offered nicotine replacement therapies but he would like to quit on his own We will revisit at his next visit again in 3 months   Follow Up Instructions: Return in about 3 months (around 10/05/2019) for Diabetes Mellitus. Fasting labs and flu shot tomorrow   I discussed the assessment and treatment plan with the patient. The patient was provided an opportunity to ask questions and all were answered. The patient agreed with the plan and demonstrated an understanding of the instructions.   The patient was advised to call back or seek an in-person evaluation if the symptoms worsen or if the condition fails to improve as anticipated.     I provided 20 minutes total of non-face-to-face time  during this encounter including median intraservice time, reviewing previous notes, labs, imaging, medications, management and patient verbalized understanding.     Charlott Rakes, MD, FAAFP. [facility] [place], [place]   07/06/2019, 8:58 AM

## 2019-07-06 NOTE — Progress Notes (Signed)
Patient has been called and DOB has been verified. Patient has been screened and transferred to PCP to start phone visit.   Patient needs refills on all medications. 

## 2019-07-07 ENCOUNTER — Ambulatory Visit (HOSPITAL_BASED_OUTPATIENT_CLINIC_OR_DEPARTMENT_OTHER): Payer: Medicare Other | Admitting: Pharmacist

## 2019-07-07 ENCOUNTER — Ambulatory Visit: Payer: Medicare Other | Attending: Family Medicine

## 2019-07-07 ENCOUNTER — Other Ambulatory Visit: Payer: Self-pay

## 2019-07-07 DIAGNOSIS — Z23 Encounter for immunization: Secondary | ICD-10-CM

## 2019-07-07 DIAGNOSIS — E119 Type 2 diabetes mellitus without complications: Secondary | ICD-10-CM | POA: Diagnosis not present

## 2019-07-07 DIAGNOSIS — Z794 Long term (current) use of insulin: Secondary | ICD-10-CM

## 2019-07-07 NOTE — Progress Notes (Signed)
Patient presents for vaccination against influenza per orders of Dr. Newlin. Consent given. Counseling provided. No contraindications exists. Vaccine administered without incident.   

## 2019-07-08 LAB — CMP14+EGFR
ALT: 34 IU/L (ref 0–44)
AST: 23 IU/L (ref 0–40)
Albumin/Globulin Ratio: 2 (ref 1.2–2.2)
Albumin: 4.7 g/dL (ref 3.8–4.9)
Alkaline Phosphatase: 62 IU/L (ref 39–117)
BUN/Creatinine Ratio: 12 (ref 9–20)
BUN: 17 mg/dL (ref 6–24)
Bilirubin Total: 0.4 mg/dL (ref 0.0–1.2)
CO2: 26 mmol/L (ref 20–29)
Calcium: 9.9 mg/dL (ref 8.7–10.2)
Chloride: 99 mmol/L (ref 96–106)
Creatinine, Ser: 1.45 mg/dL — ABNORMAL HIGH (ref 0.76–1.27)
GFR calc Af Amer: 61 mL/min/{1.73_m2} (ref >59)
GFR calc non Af Amer: 52 mL/min/{1.73_m2} — ABNORMAL LOW (ref >59)
Globulin, Total: 2.4 g/dL (ref 1.5–4.5)
Glucose: 129 mg/dL — ABNORMAL HIGH (ref 65–99)
Potassium: 3.8 mmol/L (ref 3.5–5.2)
Sodium: 143 mmol/L (ref 134–144)
Total Protein: 7.1 g/dL (ref 6.0–8.5)

## 2019-07-08 LAB — LIPID PANEL
Chol/HDL Ratio: 4.8 ratio (ref 0.0–5.0)
Cholesterol, Total: 135 mg/dL (ref 100–199)
HDL: 28 mg/dL — ABNORMAL LOW (ref >39)
LDL Chol Calc (NIH): 87 mg/dL (ref 0–99)
Triglycerides: 108 mg/dL (ref 0–149)
VLDL Cholesterol Cal: 20 mg/dL (ref 5–40)

## 2019-07-08 LAB — MICROALBUMIN / CREATININE URINE RATIO
Creatinine, Urine: 123.8 mg/dL
Microalb/Creat Ratio: 3 mg/g creat (ref 0–29)
Microalbumin, Urine: 3.3 ug/mL

## 2019-07-08 LAB — HEMOGLOBIN A1C
Est. average glucose Bld gHb Est-mCnc: 143 mg/dL
Hgb A1c MFr Bld: 6.6 % — ABNORMAL HIGH (ref 4.8–5.6)

## 2019-07-09 ENCOUNTER — Telehealth: Payer: Self-pay

## 2019-07-09 NOTE — Telephone Encounter (Signed)
-----   Message from Charlott Rakes, MD sent at 07/08/2019 12:29 PM EDT ----- Please inform the patient that labs are normal. Thank you.

## 2019-07-09 NOTE — Telephone Encounter (Signed)
Patient name and DOB has been verified Patient was informed of lab results. Patient had no questions.  

## 2019-10-05 ENCOUNTER — Encounter: Payer: Self-pay | Admitting: Family Medicine

## 2019-10-05 ENCOUNTER — Ambulatory Visit: Payer: Medicare Other | Attending: Family Medicine | Admitting: Family Medicine

## 2019-10-05 ENCOUNTER — Other Ambulatory Visit: Payer: Self-pay

## 2019-10-05 VITALS — BP 136/74 | HR 67 | Temp 98.2°F | Ht 66.0 in | Wt 177.0 lb

## 2019-10-05 DIAGNOSIS — E782 Mixed hyperlipidemia: Secondary | ICD-10-CM

## 2019-10-05 DIAGNOSIS — K219 Gastro-esophageal reflux disease without esophagitis: Secondary | ICD-10-CM

## 2019-10-05 DIAGNOSIS — M10062 Idiopathic gout, left knee: Secondary | ICD-10-CM | POA: Diagnosis not present

## 2019-10-05 DIAGNOSIS — E785 Hyperlipidemia, unspecified: Secondary | ICD-10-CM

## 2019-10-05 DIAGNOSIS — E119 Type 2 diabetes mellitus without complications: Secondary | ICD-10-CM | POA: Diagnosis not present

## 2019-10-05 DIAGNOSIS — I10 Essential (primary) hypertension: Secondary | ICD-10-CM | POA: Diagnosis not present

## 2019-10-05 DIAGNOSIS — N481 Balanitis: Secondary | ICD-10-CM

## 2019-10-05 LAB — POCT GLYCOSYLATED HEMOGLOBIN (HGB A1C): HbA1c, POC (controlled diabetic range): 6.8 % (ref 0.0–7.0)

## 2019-10-05 LAB — GLUCOSE, POCT (MANUAL RESULT ENTRY): POC Glucose: 151 mg/dl — AB (ref 70–99)

## 2019-10-05 MED ORDER — LISINOPRIL-HYDROCHLOROTHIAZIDE 20-25 MG PO TABS: 1.0000 | ORAL_TABLET | Freq: Every day | ORAL | 1 refills | Status: DC

## 2019-10-05 MED ORDER — CLOTRIMAZOLE 1 % EX CREA: 1.0000 "application " | TOPICAL_CREAM | Freq: Two times a day (BID) | CUTANEOUS | 0 refills | Status: DC

## 2019-10-05 MED ORDER — METFORMIN HCL ER 500 MG PO TB24: 500.0000 mg | ORAL_TABLET | Freq: Every day | ORAL | 1 refills | Status: DC

## 2019-10-05 MED ORDER — AMLODIPINE BESYLATE 10 MG PO TABS: 10.0000 mg | ORAL_TABLET | Freq: Every day | ORAL | 1 refills | Status: DC

## 2019-10-05 MED ORDER — PANTOPRAZOLE SODIUM 40 MG PO TBEC: 40.0000 mg | DELAYED_RELEASE_TABLET | Freq: Every day | ORAL | 1 refills | Status: DC

## 2019-10-05 MED ORDER — ATORVASTATIN CALCIUM 40 MG PO TABS: 40.0000 mg | ORAL_TABLET | Freq: Every day | ORAL | 1 refills | Status: DC

## 2019-10-05 MED ORDER — COLCHICINE 0.6 MG PO TABS: ORAL_TABLET | ORAL | 2 refills | Status: DC

## 2019-10-05 NOTE — Progress Notes (Signed)
Subjective:  Patient ID: Henry Walker, male    DOB: 1961-04-11  Age: 58 y.o. MRN: [id number]  CC: Diabetes   HPI Henry Walker is a 58 year old male with a history of type 2 diabetes mellitus (A1c 6.8), hypertension, hyperlipidemia, GERD, tobacco abuse who presents for follow-up visit.  With regards to his diabetes mellitus he is doing well and denies hypoglycemia, numbness in extremities, visual concerns. Every once in a while he has a Gout flare which is trigerred by beef; his flares are infrequent with the last several years ago. His left knee has been painful for the last 3 weeks and he had an instance where he could not bend his knee, had associated swelling and he had to be off his left leg. Pain is 2/10 at this time.Tylenol provides some relief.  He complains of itching in his penis with associated whitish coating.  He is not circumcised and is wondering if this has anything to do with it.  Denies presence of penile discharge.  Past Medical History:  Diagnosis Date  . Diabetes mellitus without complication ([place])   . GERD (gastroesophageal reflux disease)   . Hx of adenomatous colonic polyps 10/06/2014  . Hyperlipidemia   . Hypertension     Past Surgical History:  Procedure Laterality Date  . BACK SURGERY    . COLONOSCOPY      Family History  Problem Relation Age of Onset  . Colon cancer Neg Hx   . Esophageal cancer Neg Hx   . Rectal cancer Neg Hx   . Stomach cancer Neg Hx   . Colon polyps Neg Hx     No Known Allergies  Outpatient Medications Prior to Visit  Medication Sig Dispense Refill  . amLODipine (NORVASC) 10 MG tablet Take 1 tablet (10 mg total) by mouth daily. 90 tablet 1  . atorvastatin (LIPITOR) 40 MG tablet Take 1 tablet (40 mg total) by mouth daily. 90 tablet 1  . Blood Glucose Monitoring Suppl (ONE TOUCH ULTRA 2) w/Device KIT Use as directed 3 times daily before meals. 1 kit 0  . glucose blood (ONETOUCH ULTRA) test strip Use as  instructed 3 times daily 100 each 12  . Lancet Devices (ACCU-CHEK SOFTCLIX) lancets Use as instructed 1 each 0  . Lancets (ONETOUCH ULTRASOFT) lancets Use as instructed 3 times daily 100 each 12  . lisinopril-hydrochlorothiazide (ZESTORETIC) 20-25 MG tablet Take 1 tablet by mouth daily. 90 tablet 1  . metFORMIN (GLUCOPHAGE XR) 500 MG 24 hr tablet Take 1 tablet (500 mg total) by mouth daily with breakfast. 90 tablet 1  . omega-3 acid ethyl esters (LOVAZA) 1 g capsule TAKE 1 CAPSULE BY MOUTH TWICE DAILY 60 capsule 2  . pantoprazole (PROTONIX) 40 MG tablet Take 1 tablet (40 mg total) by mouth daily. 90 tablet 1  . aspirin EC 81 MG tablet Take 1 tablet (81 mg total) by mouth daily. (Patient not taking: Reported on 01/23/2018) 30 tablet 11  . ofloxacin (FLOXIN) 0.3 % OTIC solution Place 10 drops daily into the left ear. For 7 days. (Patient not taking: Reported on 01/23/2018) 5 mL 0  . sildenafil (VIAGRA) 50 MG tablet Take 1 tablet (50 mg total) by mouth daily as needed for erectile dysfunction. At least 24 hours between doses (Patient not taking: Reported on 07/06/2019) 10 tablet 3   Facility-Administered Medications Prior to Visit  Medication Dose Route Frequency Provider Last Rate Last Dose  . 0.9 %  sodium chloride infusion  500 mL  Intravenous Once Gatha Mayer, MD      . carbamide peroxide (DEBROX) 6.5 % otic solution 5 drop  5 drop Left EAR Once Langeland, Dawn T, MD         ROS Review of Systems  Constitutional: Negative for activity change and appetite change.  HENT: Negative for sinus pressure and sore throat.   Eyes: Negative for visual disturbance.  Respiratory: Negative for cough, chest tightness and shortness of breath.   Cardiovascular: Negative for chest pain and leg swelling.  Gastrointestinal: Negative for abdominal distention, abdominal pain, constipation and diarrhea.  Endocrine: Negative.   Genitourinary: Negative for dysuria.  Musculoskeletal: Negative for joint swelling  and myalgias.  Skin: Negative for rash.  Allergic/Immunologic: Negative.   Neurological: Negative for weakness, light-headedness and numbness.  Psychiatric/Behavioral: Negative for dysphoric mood and suicidal ideas.    Objective:  BP 136/74   Pulse 67   Temp 98.2 F (36.8 C) (Oral)   Ht '5\' 6"'  (1.676 m)   Wt 177 lb (80.3 kg)   SpO2 96%   BMI 28.57 kg/m   BP/Weight 10/05/2019 1/85/6314 07/04/262  Systolic BP 785 885 -  Diastolic BP 74 68 -  Wt. (Lbs) 177 181 175  BMI 28.57 29.21 28.25      Physical Exam Constitutional:      Appearance: He is well-developed.  Neck:     Vascular: No JVD.  Cardiovascular:     Rate and Rhythm: Normal rate.     Heart sounds: Normal heart sounds. No murmur.  Pulmonary:     Effort: Pulmonary effort is normal.     Breath sounds: Normal breath sounds. No wheezing or rales.  Chest:     Chest wall: No tenderness.  Abdominal:     General: Bowel sounds are normal. There is no distension.     Palpations: Abdomen is soft. There is no mass.     Tenderness: There is no abdominal tenderness.  Musculoskeletal: Normal range of motion.     Right lower leg: No edema.     Left lower leg: No edema.  Neurological:     Mental Status: He is alert and oriented to person, place, and time.  Psychiatric:        Mood and Affect: Mood normal.    Sensory exam of the foot is normal, tested with the monofilament. Good pulses, no lesions or ulcers, good peripheral pulses.  CMP Latest Ref Rng & Units 07/07/2019 10/06/2018 06/19/2018  Glucose 65 - 99 mg/dL 129(H) 84 106(H)  BUN 6 - 24 mg/dL '17 15 17  ' Creatinine 0.76 - 1.27 mg/dL 1.45(H) 1.24 1.30(H)  Sodium 134 - 144 mmol/L 143 141 138  Potassium 3.5 - 5.2 mmol/L 3.8 4.3 3.9  Chloride 96 - 106 mmol/L 99 103 99  CO2 20 - 29 mmol/L '26 20 22  ' Calcium 8.7 - 10.2 mg/dL 9.9 10.4(H) 10.1  Total Protein 6.0 - 8.5 g/dL 7.1 - 7.2  Total Bilirubin 0.0 - 1.2 mg/dL 0.4 - 0.5  Alkaline Phos 39 - 117 IU/L 62 - 61  AST 0 - 40  IU/L 23 - 20  ALT 0 - 44 IU/L 34 - 28    Lipid Panel     Component Value Date/Time   CHOL 135 07/07/2019 0954   TRIG 108 07/07/2019 0954   HDL 28 (L) 07/07/2019 0954   CHOLHDL 4.8 07/07/2019 0954   CHOLHDL 5.9 (H) 01/05/2016 1155   VLDL 30 01/05/2016 1155   LDLCALC 87 07/07/2019 0954  CBC    Component Value Date/Time   WBC 10.1 06/16/2017 2020   RBC 5.06 06/16/2017 2020   HGB 14.4 06/16/2017 2020   HCT 40.9 06/16/2017 2020   PLT 287 06/16/2017 2020   MCV 80.8 06/16/2017 2020   MCH 28.5 06/16/2017 2020   MCHC 35.2 06/16/2017 2020   RDW 13.7 06/16/2017 2020   LYMPHSABS 3,999 (H) 06/03/2016 1616   MONOABS 744 06/03/2016 1616   EOSABS 186 06/03/2016 1616   BASOSABS 0 06/03/2016 1616    Lab Results  Component Value Date   HGBA1C 6.8 10/05/2019    Assessment & Plan:   1. Essential hypertension Controlled Counseled on blood pressure goal of less than 130/80, low-sodium, DASH diet, medication compliance, 150 minutes of moderate intensity exercise per week. Discussed medication compliance, adverse effects. - amLODipine (NORVASC) 10 MG tablet; Take 1 tablet (10 mg total) by mouth daily.  Dispense: 90 tablet; Refill: 1 - lisinopril-hydrochlorothiazide (ZESTORETIC) 20-25 MG tablet; Take 1 tablet by mouth daily.  Dispense: 90 tablet; Refill: 1  2. Mixed hyperlipidemia Controlled Low-cholesterol diet - atorvastatin (LIPITOR) 40 MG tablet; Take 1 tablet (40 mg total) by mouth daily.  Dispense: 90 tablet; Refill: 1  3. Hyperlipidemia associated with type 2 diabetes mellitus ([place]) Controlled with A1c of 6.8 Continue current regimen Counseled on Diabetic diet, my plate method, 932 minutes of moderate intensity exercise/week Blood sugar logs with fasting goals of 80-120 mg/dl, random of less than 180 and in the event of sugars less than 60 mg/dl or greater than 400 mg/dl encouraged to notify the clinic. Advised on the need for annual eye exams, annual foot exams, Pneumonia  vaccine. - Glucose (CBG) - HgB A1c - metFORMIN (GLUCOPHAGE XR) 500 MG 24 hr tablet; Take 1 tablet (500 mg total) by mouth daily with breakfast.  Dispense: 90 tablet; Refill: 1  4. Gastroesophageal reflux disease without esophagitis Stable - pantoprazole (PROTONIX) 40 MG tablet; Take 1 tablet (40 mg total) by mouth daily.  Dispense: 90 tablet; Refill: 1  5. Acute idiopathic gout of left knee Current flare has resolved We will prescribe colchicine to be used as needed Cannot exclude underlying osteoarthritis - colchicine 0.6 MG tablet; Take 2 tabs (1.2 mg) at the onset of a gout attack, may repeat 1 tablet (0.6 mg) after 1 hour if symptoms persist  Dispense: 30 tablet; Refill: 2  6. Balanitis Discussed hygiene given he is uncircumcised - clotrimazole (LOTRIMIN) 1 % cream; Apply 1 application topically 2 (two) times daily.  Dispense: 30 g; Refill: 0       Charlott Rakes, MD, FAAFP. [facility] and [place], [place]   10/05/2019, 9:14 AM

## 2019-10-05 NOTE — Patient Instructions (Signed)
Balanitis  Balanitis is swelling and irritation (inflammation) of the head of the penis (glans penis). The condition may also cause inflammation of the skin around the glans penis (foreskin) in men who have not been circumcised. It may develop because of an infection or another medical condition. Balanitis occurs most often among men who have not had their foreskin removed (uncircumcised men). Balanitis sometimes causes scarring of the penis or foreskin, which can require surgery. Untreated balanitis can increase the risk of penile cancer. What are the causes? Common causes of this condition include:  Poor personal hygiene, especially in uncircumcised men. Not cleaning the glans penis and foreskin well can result in buildup of bacteria, viruses, and yeast, which can lead to infection and inflammation.  Irritation and lack of air flow due to fluid (smegma) that can build up on the glans penis. Other causes include:  Chemical irritation from products such as soaps or shower gels (especially those that have fragrance), condoms, personal lubricants, petroleum jelly, spermicides, or fabric softeners.  Skin conditions, such as eczema, dermatitis, and psoriasis.  Allergies to medicines, such as tetracycline and sulfa drugs.  Certain medical conditions, including liver cirrhosis, congestive heart failure, diabetes, and kidney disease.  Infections, such as candidiasis, HPV (human papillomavirus), herpes simplex, gonorrhea, and syphilis.  Severe obesity. What increases the risk? The following factors may make you more likely to develop this condition:  Having diabetes. This is the most common risk factor.  Having a tight foreskin that is difficult to pull back (retract) past the glans.  Having sexual intercourse without using a condom. What are the signs or symptoms? Symptoms of this condition include:  Discharge from under the foreskin.  A bad smell.  Pain or difficulty retracting the  foreskin.  Tenderness, redness, and swelling of the glans.  A rash or sores on the glans or foreskin.  Itchiness.  Inability to get an erection due to pain.  Difficulty urinating.  Scarring of the penis or foreskin, in some cases. How is this diagnosed? This condition may be diagnosed based on:  A physical exam.  Testing a swab of discharge to check for bacterial or fungal infection.  Blood tests: ? To check for viruses that can cause balanitis. ? To check your blood sugar (glucose) level. High blood glucose could be a sign of diabetes, which can cause balanitis. How is this treated? Treatment for balanitis depends on the cause. Treatment may include:  Improving personal hygiene. Your health care provider may recommend sitting in a bath of warm water that is deep enough to cover your hips and buttocks (sitz bath).  Medicines such as: ? Creams or ointments to reduce swelling (steroids) or to treat an infection. ? Antibiotic medicine. ? Antifungal medicine.  Surgery to remove or cut the foreskin (circumcision). This may be done if you have scarring on the foreskin that makes it difficult to retract.  Controlling other medical problems that may be causing your condition or making it worse. Follow these instructions at home:  Do not have sex until the condition clears up, or until your health care provider approves.  Keep your penis clean and dry. Take sitz baths as recommended by your health care provider.  Avoid products that irritate your skin or make symptoms worse, such as soaps and shower gels that have fragrance.  Take over-the-counter and prescription medicines only as told by your health care provider. ? If you were prescribed an antibiotic medicine or a cream or ointment, use it as   told by your health care provider. Do not stop using your medicine, cream, or ointment even if you start to feel better. ? Do not drive or use heavy machinery while taking prescription  pain medicine. Contact a health care provider if:  Your symptoms get worse or do not improve with home care.  You develop chills or a fever.  You have trouble urinating.  You cannot retract your foreskin. Get help right away if:  You develop severe pain.  You are unable to urinate. Summary  Balanitis is inflammation of the head of the penis (glans penis) caused by irritation or infection.  Balanitis causes pain, redness, and swelling of the glans penis.  This condition is most common among uncircumcised men who do not keep their glans penis clean and in men who have diabetes.  Treatment may include creams or ointments.  Good hygiene is important for prevention. This includes pulling back the foreskin when washing your penis. This information is not intended to replace advice given to you by your health care provider. Make sure you discuss any questions you have with your health care provider. Document Released: 03/02/2009 Document Revised: 09/26/2017 Document Reviewed: 09/02/2016 Elsevier Patient Education  2020 Else[employer] Inc.  

## 2019-10-05 NOTE — Progress Notes (Signed)
Patient is having pain in left knee.

## 2019-11-10 DIAGNOSIS — H2513 Age-related nuclear cataract, bilateral: Secondary | ICD-10-CM | POA: Diagnosis not present

## 2019-11-10 DIAGNOSIS — H0015 Chalazion left lower eyelid: Secondary | ICD-10-CM | POA: Diagnosis not present

## 2019-11-10 DIAGNOSIS — E119 Type 2 diabetes mellitus without complications: Secondary | ICD-10-CM | POA: Diagnosis not present

## 2019-11-10 LAB — HM DIABETES EYE EXAM

## 2020-01-20 ENCOUNTER — Other Ambulatory Visit: Payer: Self-pay | Admitting: Pharmacist

## 2020-01-20 DIAGNOSIS — I10 Essential (primary) hypertension: Secondary | ICD-10-CM

## 2020-01-20 DIAGNOSIS — E1169 Type 2 diabetes mellitus with other specified complication: Secondary | ICD-10-CM

## 2020-01-20 MED ORDER — METFORMIN HCL ER 500 MG PO TB24: 500.0000 mg | ORAL_TABLET | Freq: Every day | ORAL | 0 refills | Status: DC

## 2020-01-20 MED ORDER — AMLODIPINE BESYLATE 10 MG PO TABS: 10.0000 mg | ORAL_TABLET | Freq: Every day | ORAL | 0 refills | Status: DC

## 2020-01-20 MED ORDER — LISINOPRIL-HYDROCHLOROTHIAZIDE 20-25 MG PO TABS: 1.0000 | ORAL_TABLET | Freq: Every day | ORAL | 0 refills | Status: DC

## 2020-02-07 ENCOUNTER — Encounter: Payer: Self-pay | Admitting: Family Medicine

## 2020-02-07 ENCOUNTER — Ambulatory Visit: Payer: Medicare Other | Attending: Family Medicine | Admitting: Family Medicine

## 2020-02-07 ENCOUNTER — Other Ambulatory Visit: Payer: Self-pay

## 2020-02-07 VITALS — BP 123/71 | HR 72 | Ht 66.0 in | Wt 175.0 lb

## 2020-02-07 DIAGNOSIS — E785 Hyperlipidemia, unspecified: Secondary | ICD-10-CM

## 2020-02-07 DIAGNOSIS — F17210 Nicotine dependence, cigarettes, uncomplicated: Secondary | ICD-10-CM | POA: Diagnosis not present

## 2020-02-07 DIAGNOSIS — E1169 Type 2 diabetes mellitus with other specified complication: Secondary | ICD-10-CM | POA: Diagnosis not present

## 2020-02-07 DIAGNOSIS — Z794 Long term (current) use of insulin: Secondary | ICD-10-CM | POA: Diagnosis not present

## 2020-02-07 DIAGNOSIS — E782 Mixed hyperlipidemia: Secondary | ICD-10-CM

## 2020-02-07 DIAGNOSIS — K219 Gastro-esophageal reflux disease without esophagitis: Secondary | ICD-10-CM | POA: Diagnosis not present

## 2020-02-07 DIAGNOSIS — F17200 Nicotine dependence, unspecified, uncomplicated: Secondary | ICD-10-CM

## 2020-02-07 DIAGNOSIS — I10 Essential (primary) hypertension: Secondary | ICD-10-CM | POA: Diagnosis not present

## 2020-02-07 LAB — POCT GLYCOSYLATED HEMOGLOBIN (HGB A1C): HbA1c, POC (controlled diabetic range): 6.8 % (ref 0.0–7.0)

## 2020-02-07 LAB — GLUCOSE, POCT (MANUAL RESULT ENTRY): POC Glucose: 138 mg/dl — AB (ref 70–99)

## 2020-02-07 MED ORDER — PANTOPRAZOLE SODIUM 40 MG PO TBEC: 40.0000 mg | DELAYED_RELEASE_TABLET | Freq: Every day | ORAL | 1 refills | Status: DC

## 2020-02-07 MED ORDER — ATORVASTATIN CALCIUM 40 MG PO TABS: 40.0000 mg | ORAL_TABLET | Freq: Every day | ORAL | 1 refills | Status: DC

## 2020-02-07 MED ORDER — METFORMIN HCL ER 500 MG PO TB24: 500.0000 mg | ORAL_TABLET | Freq: Every day | ORAL | 1 refills | Status: DC

## 2020-02-07 MED ORDER — AMLODIPINE BESYLATE 10 MG PO TABS: 10.0000 mg | ORAL_TABLET | Freq: Every day | ORAL | 1 refills | Status: DC

## 2020-02-07 MED ORDER — LISINOPRIL-HYDROCHLOROTHIAZIDE 20-25 MG PO TABS: 1.0000 | ORAL_TABLET | Freq: Every day | ORAL | 1 refills | Status: DC

## 2020-02-07 NOTE — Patient Instructions (Signed)
Steps to Quit Smoking Smoking tobacco is the leading cause of preventable death. It can affect almost every organ in the body. Smoking puts you and people around you at risk for many serious, long-lasting (chronic) diseases. Quitting smoking can be hard, but it is one of the best things that you can do for your health. It is never too late to quit. How do I get ready to quit? When you decide to quit smoking, make a plan to help you succeed. Before you quit:  Pick a date to quit. Set a date within the next 2 weeks to give you time to prepare.  Write down the reasons why you are quitting. Keep this list in places where you will see it often.  Tell your family, friends, and co-workers that you are quitting. Their support is important.  Talk with your doctor about the choices that may help you quit.  Find out if your health insurance will pay for these treatments.  Know the people, places, things, and activities that make you want to smoke (triggers). Avoid them. What first steps can I take to quit smoking?  Throw away all cigarettes at home, at work, and in your car.  Throw away the things that you use when you smoke, such as ashtrays and lighters.  Clean your car. Make sure to empty the ashtray.  Clean your home, including curtains and carpets. What can I do to help me quit smoking? Talk with your doctor about taking medicines and seeing a counselor at the same time. You are more likely to succeed when you do both.  If you are pregnant or breastfeeding, talk with your doctor about counseling or other ways to quit smoking. Do not take medicine to help you quit smoking unless your doctor tells you to do so. To quit smoking: Quit right away  Quit smoking totally, instead of slowly cutting back on how much you smoke over a period of time.  Go to counseling. You are more likely to quit if you go to counseling sessions regularly. Take medicine You may take medicines to help you quit. Some  medicines need a prescription, and some you can buy over-the-counter. Some medicines may contain a drug called nicotine to replace the nicotine in cigarettes. Medicines may:  Help you to stop having the desire to smoke (cravings).  Help to stop the problems that come when you stop smoking (withdrawal symptoms). Your doctor may ask you to use:  Nicotine patches, gum, or lozenges.  Nicotine inhalers or sprays.  Non-nicotine medicine that is taken by mouth. Find resources Find resources and other ways to help you quit smoking and remain smoke-free after you quit. These resources are most helpful when you use them often. They include:  Online chats with a counselor.  Phone quitlines.  Printed self-help materials.  Support groups or group counseling.  Text messaging programs.  Mobile phone apps. Use apps on your mobile phone or tablet that can help you stick to your quit plan. There are many free apps for mobile phones and tablets as well as websites. Examples include Quit Guide from the CDC and smokefree.gov  What things can I do to make it easier to quit?   Talk to your family and friends. Ask them to support and encourage you.  Call a phone quitline (1-800-QUIT-NOW), reach out to support groups, or work with a counselor.  Ask people who smoke to not smoke around you.  Avoid places that make you want to smoke,   such as: ? Bars. ? Parties. ? Smoke-break areas at work.  Spend time with people who do not smoke.  Lower the stress in your life. Stress can make you want to smoke. Try these things to help your stress: ? Getting regular exercise. ? Doing deep-breathing exercises. ? Doing yoga. ? Meditating. ? Doing a body scan. To do this, close your eyes, focus on one area of your body at a time from head to toe. Notice which parts of your body are tense. Try to relax the muscles in those areas. How will I feel when I quit smoking? Day 1 to 3 weeks Within the first 24 hours,  you may start to have some problems that come from quitting tobacco. These problems are very bad 2-3 days after you quit, but they do not often last for more than 2-3 weeks. You may get these symptoms:  Mood swings.  Feeling restless, nervous, angry, or annoyed.  Trouble concentrating.  Dizziness.  Strong desire for high-sugar foods and nicotine.  Weight gain.  Trouble pooping (constipation).  Feeling like you may vomit (nausea).  Coughing or a sore throat.  Changes in how the medicines that you take for other issues work in your body.  Depression.  Trouble sleeping (insomnia). Week 3 and afterward After the first 2-3 weeks of quitting, you may start to notice more positive results, such as:  Better sense of smell and taste.  Less coughing and sore throat.  Slower heart rate.  Lower blood pressure.  Clearer skin.  Better breathing.  Fewer sick days. Quitting smoking can be hard. Do not give up if you fail the first time. Some people need to try a few times before they succeed. Do your best to stick to your quit plan, and talk with your doctor if you have any questions or concerns. Summary  Smoking tobacco is the leading cause of preventable death. Quitting smoking can be hard, but it is one of the best things that you can do for your health.  When you decide to quit smoking, make a plan to help you succeed.  Quit smoking right away, not slowly over a period of time.  When you start quitting, seek help from your doctor, family, or friends. This information is not intended to replace advice given to you by your health care provider. Make sure you discuss any questions you have with your health care provider. Document Revised: 07/09/2019 Document Reviewed: 01/02/2019 Elsevier Patient Education  2020 Elsevier Inc.  

## 2020-02-07 NOTE — Progress Notes (Signed)
Subjective:  Patient ID: Henry Walker, male    DOB: 1961/04/15  Age: 59 y.o. MRN: [id number]  CC: Diabetes   HPI Henry Walker  is a 59 year old male with a history of type 2 diabetes mellitus (A1c 6.8), hypertension, hyperlipidemia, GERD,Gout, Tobacco abusewho presents for follow-up visit.  Last eye exam was in 09/2019 and he denies visual concerns, has no numbness in extremities, he has not had any hypoglycemic episodes and remains compliant with Metformin. He is compliant with his statin and his antihypertensive with no complaints of adverse effects.  Gout has been stable with no flares and his reflux is controlled He has no additional concerns today.  Past Medical History:  Diagnosis Date  . Diabetes mellitus without complication ([place])   . GERD (gastroesophageal reflux disease)   . Hx of adenomatous colonic polyps 10/06/2014  . Hyperlipidemia   . Hypertension     Past Surgical History:  Procedure Laterality Date  . BACK SURGERY    . COLONOSCOPY      Family History  Problem Relation Age of Onset  . Colon cancer Neg Hx   . Esophageal cancer Neg Hx   . Rectal cancer Neg Hx   . Stomach cancer Neg Hx   . Colon polyps Neg Hx     No Known Allergies  Outpatient Medications Prior to Visit  Medication Sig Dispense Refill  . amLODipine (NORVASC) 10 MG tablet Take 1 tablet (10 mg total) by mouth daily. Must have office visit for refills 90 tablet 0  . atorvastatin (LIPITOR) 40 MG tablet Take 1 tablet (40 mg total) by mouth daily. 90 tablet 1  . Blood Glucose Monitoring Suppl (ONE TOUCH ULTRA 2) w/Device KIT Use as directed 3 times daily before meals. 1 kit 0  . Lancet Devices (ACCU-CHEK SOFTCLIX) lancets Use as instructed 1 each 0  . Lancets (ONETOUCH ULTRASOFT) lancets Use as instructed 3 times daily 100 each 12  . lisinopril-hydrochlorothiazide (ZESTORETIC) 20-25 MG tablet Take 1 tablet by mouth daily. Must have office visit for refills 90 tablet 0  .  metFORMIN (GLUCOPHAGE XR) 500 MG 24 hr tablet Take 1 tablet (500 mg total) by mouth daily with breakfast. Must have office visit for refills 90 tablet 0  . omega-3 acid ethyl esters (LOVAZA) 1 g capsule TAKE 1 CAPSULE BY MOUTH TWICE DAILY 60 capsule 2  . pantoprazole (PROTONIX) 40 MG tablet Take 1 tablet (40 mg total) by mouth daily. 90 tablet 1  . aspirin EC 81 MG tablet Take 1 tablet (81 mg total) by mouth daily. (Patient not taking: Reported on 01/23/2018) 30 tablet 11  . clotrimazole (LOTRIMIN) 1 % cream Apply 1 application topically 2 (two) times daily. (Patient not taking: Reported on 02/07/2020) 30 g 0  . colchicine 0.6 MG tablet Take 2 tabs (1.2 mg) at the onset of a gout attack, may repeat 1 tablet (0.6 mg) after 1 hour if symptoms persist (Patient not taking: Reported on 02/07/2020) 30 tablet 2  . glucose blood (ONETOUCH ULTRA) test strip Use as instructed 3 times daily (Patient not taking: Reported on 02/07/2020) 100 each 12  . ofloxacin (FLOXIN) 0.3 % OTIC solution Place 10 drops daily into the left ear. For 7 days. (Patient not taking: Reported on 01/23/2018) 5 mL 0  . sildenafil (VIAGRA) 50 MG tablet Take 1 tablet (50 mg total) by mouth daily as needed for erectile dysfunction. At least 24 hours between doses (Patient not taking: Reported on 07/06/2019) 10 tablet 3  Facility-Administered Medications Prior to Visit  Medication Dose Route Frequency Provider Last Rate Last Admin  . 0.9 %  sodium chloride infusion  500 mL Intravenous Once Gatha Mayer, MD      . carbamide peroxide (DEBROX) 6.5 % otic solution 5 drop  5 drop Left EAR Once Langeland, Dawn T, MD         ROS Review of Systems  Constitutional: Negative for activity change and appetite change.  HENT: Negative for sinus pressure and sore throat.   Eyes: Negative for visual disturbance.  Respiratory: Negative for cough, chest tightness and shortness of breath.   Cardiovascular: Negative for chest pain and leg swelling.    Gastrointestinal: Negative for abdominal distention, abdominal pain, constipation and diarrhea.  Endocrine: Negative.   Genitourinary: Negative for dysuria.  Musculoskeletal: Negative for joint swelling and myalgias.  Skin: Negative for rash.  Allergic/Immunologic: Negative.   Neurological: Negative for weakness, light-headedness and numbness.  Psychiatric/Behavioral: Negative for dysphoric mood and suicidal ideas.    Objective:  BP 123/71   Pulse 72   Ht '5\' 6"'  (1.676 m)   Wt 175 lb (79.4 kg)   SpO2 97%   BMI 28.25 kg/m   BP/Weight 02/07/2020 10/05/2019 11/15/4172  Systolic BP 081 448 185  Diastolic BP 71 74 68  Wt. (Lbs) 175 177 181  BMI 28.25 28.57 29.21      Physical Exam Constitutional:      Appearance: He is well-developed.  Neck:     Vascular: No JVD.  Cardiovascular:     Rate and Rhythm: Normal rate.     Heart sounds: Normal heart sounds. No murmur.  Pulmonary:     Effort: Pulmonary effort is normal.     Breath sounds: Normal breath sounds. No wheezing or rales.  Chest:     Chest wall: No tenderness.  Abdominal:     General: Bowel sounds are normal. There is no distension.     Palpations: Abdomen is soft. There is no mass.     Tenderness: There is no abdominal tenderness.  Musculoskeletal:        General: Normal range of motion.     Right lower leg: No edema.     Left lower leg: No edema.  Neurological:     Mental Status: He is alert and oriented to person, place, and time.  Psychiatric:        Mood and Affect: Mood normal.     CMP Latest Ref Rng & Units 07/07/2019 10/06/2018 06/19/2018  Glucose 65 - 99 mg/dL 129(H) 84 106(H)  BUN 6 - 24 mg/dL '17 15 17  ' Creatinine 0.76 - 1.27 mg/dL 1.45(H) 1.24 1.30(H)  Sodium 134 - 144 mmol/L 143 141 138  Potassium 3.5 - 5.2 mmol/L 3.8 4.3 3.9  Chloride 96 - 106 mmol/L 99 103 99  CO2 20 - 29 mmol/L '26 20 22  ' Calcium 8.7 - 10.2 mg/dL 9.9 10.4(H) 10.1  Total Protein 6.0 - 8.5 g/dL 7.1 - 7.2  Total Bilirubin 0.0 -  1.2 mg/dL 0.4 - 0.5  Alkaline Phos 39 - 117 IU/L 62 - 61  AST 0 - 40 IU/L 23 - 20  ALT 0 - 44 IU/L 34 - 28    Lipid Panel     Component Value Date/Time   CHOL 135 07/07/2019 0954   TRIG 108 07/07/2019 0954   HDL 28 (L) 07/07/2019 0954   CHOLHDL 4.8 07/07/2019 0954   CHOLHDL 5.9 (H) 01/05/2016 1155   VLDL 30 01/05/2016  1155   LDLCALC 87 07/07/2019 0954    CBC    Component Value Date/Time   WBC 10.1 06/16/2017 2020   RBC 5.06 06/16/2017 2020   HGB 14.4 06/16/2017 2020   HCT 40.9 06/16/2017 2020   PLT 287 06/16/2017 2020   MCV 80.8 06/16/2017 2020   MCH 28.5 06/16/2017 2020   MCHC 35.2 06/16/2017 2020   RDW 13.7 06/16/2017 2020   LYMPHSABS 3,999 (H) 06/03/2016 1616   MONOABS 744 06/03/2016 1616   EOSABS 186 06/03/2016 1616   BASOSABS 0 06/03/2016 1616    Lab Results  Component Value Date   HGBA1C 6.8 02/07/2020    Assessment & Plan:  1. Type 2 diabetes mellitus with other specified complication, with long-term current use of insulin (HCC) A1c of 6.8 Continue current regimen Counseled on Diabetic diet, my plate method, 707 minutes of moderate intensity exercise/week Blood sugar logs with fasting goals of 80-120 mg/dl, random of less than 180 and in the event of sugars less than 60 mg/dl or greater than 400 mg/dl encouraged to notify the clinic. Advised on the need for annual eye exams, annual foot exams, Pneumonia vaccine. - POCT glucose (manual entry) - POCT glycosylated hemoglobin (Hb A1C) - CMP14+EGFR - Microalbumin / creatinine urine ratio  2. Essential hypertension Controlled Counseled on blood pressure goal of less than 130/80, low-sodium, DASH diet, medication compliance, 150 minutes of moderate intensity exercise per week. Discussed medication compliance, adverse effects. - amLODipine (NORVASC) 10 MG tablet; Take 1 tablet (10 mg total) by mouth daily.  Dispense: 90 tablet; Refill: 1 - lisinopril-hydrochlorothiazide (ZESTORETIC) 20-25 MG tablet; Take 1  tablet by mouth daily.  Dispense: 90 tablet; Refill: 1  3. Mixed hyperlipidemia Controlled Low-cholesterol diet - atorvastatin (LIPITOR) 40 MG tablet; Take 1 tablet (40 mg total) by mouth daily.  Dispense: 90 tablet; Refill: 1  4. Hyperlipidemia associated with type 2 diabetes mellitus ([place]) See #3 above - metFORMIN (GLUCOPHAGE XR) 500 MG 24 hr tablet; Take 1 tablet (500 mg total) by mouth daily with breakfast.  Dispense: 90 tablet; Refill: 1 - Lipid panel  5. Gastroesophageal reflux disease without esophagitis Stable - pantoprazole (PROTONIX) 40 MG tablet; Take 1 tablet (40 mg total) by mouth daily.  Dispense: 90 tablet; Refill: 1  6. Tobacco use disorder Spent 3 minutes counseling on smoking cessation and he is not ready to quit at this time    Return in about 6 months (around 08/08/2020) for chronic medical conditions.  Charlott Rakes, MD, FAAFP. [facility] and [place], [place]   02/07/2020, 9:52 AM

## 2020-02-08 LAB — CMP14+EGFR
ALT: 46 IU/L — ABNORMAL HIGH (ref 0–44)
AST: 29 IU/L (ref 0–40)
Albumin/Globulin Ratio: 1.6 (ref 1.2–2.2)
Albumin: 4.9 g/dL (ref 3.8–4.9)
Alkaline Phosphatase: 76 IU/L (ref 39–117)
BUN/Creatinine Ratio: 13 (ref 9–20)
BUN: 18 mg/dL (ref 6–24)
Bilirubin Total: 0.5 mg/dL (ref 0.0–1.2)
CO2: 24 mmol/L (ref 20–29)
Calcium: 10.3 mg/dL — ABNORMAL HIGH (ref 8.7–10.2)
Chloride: 101 mmol/L (ref 96–106)
Creatinine, Ser: 1.41 mg/dL — ABNORMAL HIGH (ref 0.76–1.27)
GFR calc Af Amer: 63 mL/min/{1.73_m2} (ref >59)
GFR calc non Af Amer: 54 mL/min/{1.73_m2} — ABNORMAL LOW (ref >59)
Globulin, Total: 3 g/dL (ref 1.5–4.5)
Glucose: 127 mg/dL — ABNORMAL HIGH (ref 65–99)
Potassium: 3.9 mmol/L (ref 3.5–5.2)
Sodium: 145 mmol/L — ABNORMAL HIGH (ref 134–144)
Total Protein: 7.9 g/dL (ref 6.0–8.5)

## 2020-02-08 LAB — MICROALBUMIN / CREATININE URINE RATIO
Creatinine, Urine: 98.2 mg/dL
Microalb/Creat Ratio: <3 mg/g creat (ref 0–29)
Microalbumin, Urine: <3 ug/mL

## 2020-02-08 LAB — LIPID PANEL
Chol/HDL Ratio: 5.3 ratio — ABNORMAL HIGH (ref 0.0–5.0)
Cholesterol, Total: 147 mg/dL (ref 100–199)
HDL: 28 mg/dL — ABNORMAL LOW (ref >39)
LDL Chol Calc (NIH): 92 mg/dL (ref 0–99)
Triglycerides: 150 mg/dL — ABNORMAL HIGH (ref 0–149)
VLDL Cholesterol Cal: 27 mg/dL (ref 5–40)

## 2020-02-11 ENCOUNTER — Telehealth: Payer: Self-pay

## 2020-02-11 NOTE — Telephone Encounter (Signed)
-----   Message from Charlott Rakes, MD sent at 02/10/2020  8:25 AM EDT ----- Cholesterol is normal, other labs are stable

## 2020-02-11 NOTE — Telephone Encounter (Signed)
Patient name and DOB has been verified Patient was informed of lab results. Patient had no questions.  

## 2020-04-25 ENCOUNTER — Other Ambulatory Visit: Payer: Self-pay | Admitting: Family Medicine

## 2020-04-25 DIAGNOSIS — E1169 Type 2 diabetes mellitus with other specified complication: Secondary | ICD-10-CM

## 2020-04-25 DIAGNOSIS — E785 Hyperlipidemia, unspecified: Secondary | ICD-10-CM

## 2020-09-06 ENCOUNTER — Encounter: Payer: Self-pay | Admitting: Family Medicine

## 2020-09-06 ENCOUNTER — Ambulatory Visit: Payer: Medicare Other | Attending: Family Medicine | Admitting: Family Medicine

## 2020-09-06 ENCOUNTER — Other Ambulatory Visit: Payer: Self-pay

## 2020-09-06 VITALS — BP 130/72 | HR 91 | Ht 66.0 in | Wt 174.2 lb

## 2020-09-06 DIAGNOSIS — E785 Hyperlipidemia, unspecified: Secondary | ICD-10-CM

## 2020-09-06 DIAGNOSIS — K219 Gastro-esophageal reflux disease without esophagitis: Secondary | ICD-10-CM | POA: Diagnosis not present

## 2020-09-06 DIAGNOSIS — M1A0620 Idiopathic chronic gout, left knee, without tophus (tophi): Secondary | ICD-10-CM

## 2020-09-06 DIAGNOSIS — Z23 Encounter for immunization: Secondary | ICD-10-CM

## 2020-09-06 DIAGNOSIS — E1169 Type 2 diabetes mellitus with other specified complication: Secondary | ICD-10-CM | POA: Diagnosis not present

## 2020-09-06 DIAGNOSIS — I152 Hypertension secondary to endocrine disorders: Secondary | ICD-10-CM

## 2020-09-06 DIAGNOSIS — E1159 Type 2 diabetes mellitus with other circulatory complications: Secondary | ICD-10-CM | POA: Diagnosis not present

## 2020-09-06 LAB — POCT GLYCOSYLATED HEMOGLOBIN (HGB A1C): HbA1c, POC (controlled diabetic range): 6.5 % (ref 0.0–7.0)

## 2020-09-06 MED ORDER — AMLODIPINE BESYLATE 10 MG PO TABS: 10.0000 mg | ORAL_TABLET | Freq: Every day | ORAL | 1 refills | Status: DC

## 2020-09-06 MED ORDER — LISINOPRIL-HYDROCHLOROTHIAZIDE 20-25 MG PO TABS: 1.0000 | ORAL_TABLET | Freq: Every day | ORAL | 1 refills | Status: DC

## 2020-09-06 MED ORDER — METFORMIN HCL ER 500 MG PO TB24: ORAL_TABLET | ORAL | 1 refills | Status: DC

## 2020-09-06 MED ORDER — PANTOPRAZOLE SODIUM 40 MG PO TBEC: 40.0000 mg | DELAYED_RELEASE_TABLET | Freq: Every day | ORAL | 1 refills | Status: DC

## 2020-09-06 MED ORDER — COLCHICINE 0.6 MG PO TABS: ORAL_TABLET | ORAL | 2 refills | Status: DC

## 2020-09-06 MED ORDER — ATORVASTATIN CALCIUM 40 MG PO TABS: 40.0000 mg | ORAL_TABLET | Freq: Every day | ORAL | 1 refills | Status: DC

## 2020-09-06 NOTE — Progress Notes (Signed)
Subjective:  Patient ID: Henry Walker, male    DOB: 1961-09-23  Age: 59 y.o. MRN: [id number]  CC: Hypertension   HPI [name]  is a 59 year old male with a history of type 2 diabetes mellitus (A1c 6.5), hypertension, hyperlipidemia, GERD,Gout, Tobacco abusewho presents for follow-up visit. He has not had a Gout flare and not needed Colchicine. He is compliant with his antihypertensives and his statin with no complaints of adverse effects. Doing well on Metformin for his diabetes with no hypoglycemia, visual concerns or numbness in extremities.   For GERD he is on a PPI and states that symptoms are worse if he does not take it. He has no additional concerns today.  Past Medical History:  Diagnosis Date  . Diabetes mellitus without complication ([place])   . GERD (gastroesophageal reflux disease)   . Hx of adenomatous colonic polyps 10/06/2014  . Hyperlipidemia   . Hypertension     Past Surgical History:  Procedure Laterality Date  . BACK SURGERY    . COLONOSCOPY      Family History  Problem Relation Age of Onset  . Colon cancer Neg Hx   . Esophageal cancer Neg Hx   . Rectal cancer Neg Hx   . Stomach cancer Neg Hx   . Colon polyps Neg Hx     No Known Allergies  Outpatient Medications Prior to Visit  Medication Sig Dispense Refill  . Blood Glucose Monitoring Suppl (ONE TOUCH ULTRA 2) w/Device KIT Use as directed 3 times daily before meals. 1 kit 0  . Lancet Devices (ACCU-CHEK SOFTCLIX) lancets Use as instructed 1 each 0  . Lancets (ONETOUCH ULTRASOFT) lancets Use as instructed 3 times daily 100 each 12  . omega-3 acid ethyl esters (LOVAZA) 1 g capsule TAKE 1 CAPSULE BY MOUTH TWICE DAILY 60 capsule 2  . amLODipine (NORVASC) 10 MG tablet Take 1 tablet (10 mg total) by mouth daily. 90 tablet 1  . atorvastatin (LIPITOR) 40 MG tablet Take 1 tablet (40 mg total) by mouth daily. 90 tablet 1  . lisinopril-hydrochlorothiazide (ZESTORETIC) 20-25 MG tablet  Take 1 tablet by mouth daily. 90 tablet 1  . metFORMIN (GLUCOPHAGE-XR) 500 MG 24 hr tablet TAKE 1 TABLET(500 MG) BY MOUTH DAILY WITH BREAKFAST 90 tablet 0  . pantoprazole (PROTONIX) 40 MG tablet Take 1 tablet (40 mg total) by mouth daily. 90 tablet 1  . aspirin EC 81 MG tablet Take 1 tablet (81 mg total) by mouth daily. (Patient not taking: Reported on 01/23/2018) 30 tablet 11  . clotrimazole (LOTRIMIN) 1 % cream Apply 1 application topically 2 (two) times daily. (Patient not taking: Reported on 02/07/2020) 30 g 0  . glucose blood (ONETOUCH ULTRA) test strip Use as instructed 3 times daily (Patient not taking: Reported on 02/07/2020) 100 each 12  . ofloxacin (FLOXIN) 0.3 % OTIC solution Place 10 drops daily into the left ear. For 7 days. (Patient not taking: Reported on 01/23/2018) 5 mL 0  . sildenafil (VIAGRA) 50 MG tablet Take 1 tablet (50 mg total) by mouth daily as needed for erectile dysfunction. At least 24 hours between doses (Patient not taking: Reported on 07/06/2019) 10 tablet 3  . colchicine 0.6 MG tablet Take 2 tabs (1.2 mg) at the onset of a gout attack, may repeat 1 tablet (0.6 mg) after 1 hour if symptoms persist (Patient not taking: Reported on 02/07/2020) 30 tablet 2   Facility-Administered Medications Prior to Visit  Medication Dose Route Frequency Provider Last Rate  Last Admin  . 0.9 %  sodium chloride infusion  500 mL Intravenous Once Gatha Mayer, MD      . carbamide peroxide (DEBROX) 6.5 % otic solution 5 drop  5 drop Left EAR Once Langeland, Dawn T, MD         ROS Review of Systems  Constitutional: Negative for activity change and appetite change.  HENT: Negative for sinus pressure and sore throat.   Eyes: Negative for visual disturbance.  Respiratory: Negative for cough, chest tightness and shortness of breath.   Cardiovascular: Negative for chest pain and leg swelling.  Gastrointestinal: Negative for abdominal distention, abdominal pain, constipation and diarrhea.    Endocrine: Negative.   Genitourinary: Negative for dysuria.  Musculoskeletal: Negative for joint swelling and myalgias.  Skin: Negative for rash.  Allergic/Immunologic: Negative.   Neurological: Negative for weakness, light-headedness and numbness.  Psychiatric/Behavioral: Negative for dysphoric mood and suicidal ideas.    Objective:  BP 130/72   Pulse 91   Ht '5\' 6"'  (1.676 m)   Wt 174 lb 3.2 oz (79 kg)   SpO2 97%   BMI 28.12 kg/m   BP/Weight 09/06/2020 02/07/2020 16/10/958  Systolic BP 454 098 119  Diastolic BP 72 71 74  Wt. (Lbs) 174.2 175 177  BMI 28.12 28.25 28.57      Physical Exam Constitutional:      Appearance: He is well-developed.  Neck:     Vascular: No JVD.  Cardiovascular:     Rate and Rhythm: Normal rate.     Heart sounds: Normal heart sounds. No murmur heard.   Pulmonary:     Effort: Pulmonary effort is normal.     Breath sounds: Normal breath sounds. No wheezing or rales.  Chest:     Chest wall: No tenderness.  Abdominal:     General: Bowel sounds are normal. There is no distension.     Palpations: Abdomen is soft. There is no mass.     Tenderness: There is no abdominal tenderness.  Musculoskeletal:        General: Normal range of motion.     Right lower leg: No edema.     Left lower leg: No edema.  Neurological:     Mental Status: He is alert and oriented to person, place, and time.  Psychiatric:        Mood and Affect: Mood normal.     CMP Latest Ref Rng & Units 02/07/2020 07/07/2019 10/06/2018  Glucose 65 - 99 mg/dL 127(H) 129(H) 84  BUN 6 - 24 mg/dL '18 17 15  ' Creatinine 0.76 - 1.27 mg/dL 1.41(H) 1.45(H) 1.24  Sodium 134 - 144 mmol/L 145(H) 143 141  Potassium 3.5 - 5.2 mmol/L 3.9 3.8 4.3  Chloride 96 - 106 mmol/L 101 99 103  CO2 20 - 29 mmol/L '24 26 20  ' Calcium 8.7 - 10.2 mg/dL 10.3(H) 9.9 10.4(H)  Total Protein 6.0 - 8.5 g/dL 7.9 7.1 -  Total Bilirubin 0.0 - 1.2 mg/dL 0.5 0.4 -  Alkaline Phos 39 - 117 IU/L 76 62 -  AST 0 - 40 IU/L  29 23 -  ALT 0 - 44 IU/L 46(H) 34 -    Lipid Panel     Component Value Date/Time   CHOL 147 02/07/2020 1012   TRIG 150 (H) 02/07/2020 1012   HDL 28 (L) 02/07/2020 1012   CHOLHDL 5.3 (H) 02/07/2020 1012   CHOLHDL 5.9 (H) 01/05/2016 1155   VLDL 30 01/05/2016 1155   LDLCALC 92 02/07/2020 1012  CBC    Component Value Date/Time   WBC 10.1 06/16/2017 2020   RBC 5.06 06/16/2017 2020   HGB 14.4 06/16/2017 2020   HCT 40.9 06/16/2017 2020   PLT 287 06/16/2017 2020   MCV 80.8 06/16/2017 2020   MCH 28.5 06/16/2017 2020   MCHC 35.2 06/16/2017 2020   RDW 13.7 06/16/2017 2020   LYMPHSABS 3,999 (H) 06/03/2016 1616   MONOABS 744 06/03/2016 1616   EOSABS 186 06/03/2016 1616   BASOSABS 0 06/03/2016 1616    Lab Results  Component Value Date   HGBA1C 6.5 09/06/2020    Assessment & Plan:  1. Type 2 diabetes mellitus with other specified complication, without long-term current use of insulin (HCC) Controlled with A1c of 6.5 Continue current regimen Counseled on Diabetic diet, my plate method, 578 minutes of moderate intensity exercise/week Blood sugar logs with fasting goals of 80-120 mg/dl, random of less than 180 and in the event of sugars less than 60 mg/dl or greater than 400 mg/dl encouraged to notify the clinic. Advised on the need for annual eye exams, annual foot exams, Pneumonia vaccine. - POCT glycosylated hemoglobin (Hb A1C) - CMP14+EGFR  2. Hypertension associated with diabetes ([place]) Controlled Counseled on blood pressure goal of less than 130/80, low-sodium, DASH diet, medication compliance, 150 minutes of moderate intensity exercise per week. Discussed medication compliance, adverse effects. - amLODipine (NORVASC) 10 MG tablet; Take 1 tablet (10 mg total) by mouth daily.  Dispense: 90 tablet; Refill: 1 - lisinopril-hydrochlorothiazide (ZESTORETIC) 20-25 MG tablet; Take 1 tablet by mouth daily.  Dispense: 90 tablet; Refill: 1   3. Idiopathic chronic gout of left knee  without tophus No recent flares - colchicine 0.6 MG tablet; Take 2 tabs (1.2 mg) at the onset of a gout attack, may repeat 1 tablet (0.6 mg) after 1 hour if symptoms persist  Dispense: 30 tablet; Refill: 2  4. Hyperlipidemia associated with type 2 diabetes mellitus (HCC) Controlled Low-cholesterol diet - atorvastatin (LIPITOR) 40 MG tablet; Take 1 tablet (40 mg total) by mouth daily.  Dispense: 90 tablet; Refill: 1 - metFORMIN (GLUCOPHAGE-XR) 500 MG 24 hr tablet; TAKE 1 TABLET(500 MG) BY MOUTH DAILY WITH BREAKFAST  Dispense: 90 tablet; Refill: 1  5. Gastroesophageal reflux disease without esophagitis Stable on PPI Assessed the need for discontinuation of pantoprazole however due to ongoing symptoms he will need to remain on it. - pantoprazole (PROTONIX) 40 MG tablet; Take 1 tablet (40 mg total) by mouth daily.  Dispense: 90 tablet; Refill: 1  6. Need for immunization against influenza - Flu Vaccine QUAD 36+ mos IM   Meds ordered this encounter  Medications  . amLODipine (NORVASC) 10 MG tablet    Sig: Take 1 tablet (10 mg total) by mouth daily.    Dispense:  90 tablet    Refill:  1  . atorvastatin (LIPITOR) 40 MG tablet    Sig: Take 1 tablet (40 mg total) by mouth daily.    Dispense:  90 tablet    Refill:  1  . colchicine 0.6 MG tablet    Sig: Take 2 tabs (1.2 mg) at the onset of a gout attack, may repeat 1 tablet (0.6 mg) after 1 hour if symptoms persist    Dispense:  30 tablet    Refill:  2  . lisinopril-hydrochlorothiazide (ZESTORETIC) 20-25 MG tablet    Sig: Take 1 tablet by mouth daily.    Dispense:  90 tablet    Refill:  1  . metFORMIN (GLUCOPHAGE-XR) 500  MG 24 hr tablet    Sig: TAKE 1 TABLET(500 MG) BY MOUTH DAILY WITH BREAKFAST    Dispense:  90 tablet    Refill:  1  . pantoprazole (PROTONIX) 40 MG tablet    Sig: Take 1 tablet (40 mg total) by mouth daily.    Dispense:  90 tablet    Refill:  1    Follow-up: Return in about 6 months (around 03/06/2021) for Chronic  disease management.       Charlott Rakes, MD, FAAFP. [facility] and [facility], [place]   09/06/2020, 4:38 PM

## 2020-09-07 LAB — CMP14+EGFR
ALT: 30 IU/L (ref 0–44)
AST: 22 IU/L (ref 0–40)
Albumin/Globulin Ratio: 1.7 (ref 1.2–2.2)
Albumin: 4.6 g/dL (ref 3.8–4.9)
Alkaline Phosphatase: 65 IU/L (ref 44–121)
BUN/Creatinine Ratio: 11 (ref 10–24)
BUN: 16 mg/dL (ref 8–27)
Bilirubin Total: 0.4 mg/dL (ref 0.0–1.2)
CO2: 23 mmol/L (ref 20–29)
Calcium: 9.8 mg/dL (ref 8.6–10.2)
Chloride: 104 mmol/L (ref 96–106)
Creatinine, Ser: 1.43 mg/dL — ABNORMAL HIGH (ref 0.76–1.27)
GFR calc Af Amer: 61 mL/min/{1.73_m2} (ref >59)
GFR calc non Af Amer: 53 mL/min/{1.73_m2} — ABNORMAL LOW (ref >59)
Globulin, Total: 2.7 g/dL (ref 1.5–4.5)
Glucose: 140 mg/dL — ABNORMAL HIGH (ref 65–99)
Potassium: 3.7 mmol/L (ref 3.5–5.2)
Sodium: 143 mmol/L (ref 134–144)
Total Protein: 7.3 g/dL (ref 6.0–8.5)

## 2020-09-08 ENCOUNTER — Telehealth: Payer: Self-pay

## 2020-09-08 NOTE — Telephone Encounter (Signed)
Patient name and DOB has been verified Patient was informed of lab results. Patient had no questions.  

## 2020-09-08 NOTE — Telephone Encounter (Signed)
-----   Message from Charlott Rakes, MD sent at 09/07/2020  8:57 AM EST ----- Labs are stable

## 2020-11-10 DIAGNOSIS — E119 Type 2 diabetes mellitus without complications: Secondary | ICD-10-CM | POA: Diagnosis not present

## 2020-11-10 DIAGNOSIS — H2513 Age-related nuclear cataract, bilateral: Secondary | ICD-10-CM | POA: Diagnosis not present

## 2020-12-11 ENCOUNTER — Other Ambulatory Visit: Payer: Self-pay | Admitting: Family Medicine

## 2020-12-11 DIAGNOSIS — M1A0620 Idiopathic chronic gout, left knee, without tophus (tophi): Secondary | ICD-10-CM

## 2020-12-11 MED ORDER — COLCHICINE 0.6 MG PO TABS: ORAL_TABLET | ORAL | 0 refills | Status: DC

## 2020-12-11 NOTE — Telephone Encounter (Signed)
Medication Refill - Medication: Colchicine  Has the patient contacted their pharmacy? No. Pt states that the date is out and that he is needing to have a new prescription. Please advise.  (Agent: If no, request that the patient contact the pharmacy for the refill.) (Agent: If yes, when and what did the pharmacy advise?)  Preferred Pharmacy (with phone number or street name):  [facility] [id number] - [place], [address]  [place] [address]  Phone: [phone] Fax: [phone]  Hours: Not open 24 hours     Agent: Please be advised that RX refills may take up to 3 business days. We ask that you follow-up with your pharmacy.

## 2021-01-09 ENCOUNTER — Ambulatory Visit: Payer: Medicare Other | Admitting: Physician Assistant

## 2021-01-09 ENCOUNTER — Other Ambulatory Visit: Payer: Self-pay

## 2021-01-09 VITALS — BP 143/80 | HR 77 | Temp 98.2°F | Resp 18 | Ht 67.0 in | Wt 170.0 lb

## 2021-01-09 DIAGNOSIS — M109 Gout, unspecified: Secondary | ICD-10-CM | POA: Diagnosis not present

## 2021-01-09 MED ORDER — INDOMETHACIN 50 MG PO CAPS: 50.0000 mg | ORAL_CAPSULE | Freq: Three times a day (TID) | ORAL | 0 refills | Status: DC

## 2021-01-09 MED ORDER — KETOROLAC TROMETHAMINE 60 MG/2ML IM SOLN
60.0000 mg | Freq: Once | INTRAMUSCULAR | Status: AC
  Administered 2021-01-09: 60 mg via INTRAMUSCULAR

## 2021-01-09 NOTE — Progress Notes (Signed)
Established Patient Office Visit  Subjective:  Patient ID: Henry Walker, male    DOB: Apr 20, 1961  Age: 60 y.o. MRN: [id number]  CC:  Chief Complaint  Patient presents with  . Gout    Right Great Big Toe    HPI Henry Walker states that he has been having exquisite tenderness on his right great toe since the middle of February.  Reports that he has had several episodes of gout in the past, attributes this to eating a steak and cheese sandwich after having prime rib a few days earlier.  Reports that he was prescribed colchicine by Dr. Margarita Rana, has been taking it as directed, states that it is not offering any relief.  Reports that he has been having black stools since he started taking the colchicine.  States that he drinks approximately 1 bottle of water a day.  No other concerns at this time  Past Medical History:  Diagnosis Date  . Diabetes mellitus without complication ([place])   . GERD (gastroesophageal reflux disease)   . Hx of adenomatous colonic polyps 10/06/2014  . Hyperlipidemia   . Hypertension     Past Surgical History:  Procedure Laterality Date  . BACK SURGERY    . COLONOSCOPY      Family History  Problem Relation Age of Onset  . Colon cancer Neg Hx   . Esophageal cancer Neg Hx   . Rectal cancer Neg Hx   . Stomach cancer Neg Hx   . Colon polyps Neg Hx     Social History   Socioeconomic History  . Marital status: Divorced    Spouse name: Not on file  . Number of children: Not on file  . Years of education: Not on file  . Highest education level: Not on file  Occupational History  . Not on file  Tobacco Use  . Smoking status: Current Every Day Smoker    Packs/day: 0.50    Types: Cigarettes  . Smokeless tobacco: Never Used  Vaping Use  . Vaping Use: Never used  Substance and Sexual Activity  . Alcohol use: No    Alcohol/week: 0.0 standard drinks  . Drug use: No  . Sexual activity: Not Currently  Other Topics Concern  . 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 amLODipine (NORVASC) 10 MG tablet Take 1 tablet (10 mg total) by mouth daily. 90 tablet 1  . atorvastatin (LIPITOR) 40 MG tablet Take 1 tablet (40 mg total) by mouth daily. 90 tablet 1  . Blood Glucose Monitoring Suppl (ONE TOUCH ULTRA 2) w/Device KIT Use as directed 3 times daily before meals. 1 kit 0  . colchicine 0.6 MG tablet Take 2 tabs (1.2 mg) at the onset of a gout attack, may repeat 1 tablet (0.6 mg) after 1 hour if symptoms persist 30 tablet 0  . glucose blood (ONETOUCH ULTRA) test strip Use as instructed 3 times daily 100 each 12  . Lancet Devices (ACCU-CHEK [employer]) lancets Use as instructed 1 each 0  . Lancets (ONETOUCH ULTRASOFT) lancets Use as instructed 3 times  daily 100 each 12  . lisinopril-hydrochlorothiazide (ZESTORETIC) 20-25 MG tablet Take 1 tablet by mouth daily. 90 tablet 1  . metFORMIN (GLUCOPHAGE-XR) 500 MG 24 hr tablet TAKE 1 TABLET(500 MG) BY MOUTH DAILY WITH BREAKFAST 90 tablet 1  . ofloxacin (FLOXIN) 0.3 % OTIC solution Place 10 drops daily into the left ear. For 7 days. 5 mL 0  . pantoprazole (PROTONIX) 40 MG tablet Take 1 tablet (40 mg total) by mouth daily. 90 tablet 1  . aspirin EC 81 MG tablet Take 1 tablet (81 mg total) by mouth daily. (Patient not taking: No sig reported) 30 tablet 11  . clotrimazole (LOTRIMIN) 1 % cream Apply 1 application topically 2 (two) times daily. (Patient not taking: No sig reported) 30 g 0  . omega-3 acid ethyl esters (LOVAZA) 1 g capsule TAKE 1 CAPSULE BY MOUTH TWICE DAILY (Patient not taking: Reported on  01/09/2021) 60 capsule 2  . sildenafil (VIAGRA) 50 MG tablet Take 1 tablet (50 mg total) by mouth daily as needed for erectile dysfunction. At least 24 hours between doses (Patient not taking: No sig reported) 10 tablet 3   Facility-Administered Medications Prior to Visit  Medication Dose Route Frequency Provider Last Rate Last Admin  . 0.9 %  sodium chloride infusion  500 mL Intravenous Once Gatha Mayer, MD      . carbamide peroxide (DEBROX) 6.5 % otic solution 5 drop  5 drop Left EAR Once Langeland, Dawn T, MD        No Known Allergies  ROS Review of Systems  Constitutional: Negative for chills and fever.  HENT: Negative.   Eyes: Negative.   Respiratory: Negative.   Cardiovascular: Negative.   Gastrointestinal: Negative.   Endocrine: Negative.   Genitourinary: Negative.   Musculoskeletal: Positive for gait problem and joint swelling.  Skin: Negative for color change and wound.  Allergic/Immunologic: Negative.   Hematological: Negative.   Psychiatric/Behavioral: Negative.       Objective:    Physical Exam Vitals and nursing note reviewed.  Constitutional:      Appearance: Normal appearance.  HENT:     Head: Normocephalic and atraumatic.     Right Ear: External ear normal.     Left Ear: External ear normal.     Nose: Nose normal.     Mouth/Throat:     Mouth: Mucous membranes are moist.     Pharynx: Oropharynx is clear.  Eyes:     Conjunctiva/sclera: Conjunctivae normal.     Pupils: Pupils are equal, round, and reactive to light.  Cardiovascular:     Rate and Rhythm: Normal rate and regular rhythm.     Pulses: Normal pulses.          Dorsalis pedis pulses are 2+ on the right side and 2+ on the left side.       Posterior tibial pulses are 2+ on the right side and 2+ on the left side.     Heart sounds: Normal heart sounds.  Pulmonary:     Effort: Pulmonary effort is normal.     Breath sounds: Normal breath sounds.  Musculoskeletal:     Cervical back: Normal  range of motion and neck supple.       Feet:  Skin:    General: Skin is warm and dry.     Findings: Erythema present.  Neurological:     General: No focal deficit present.     Mental Status: He is alert and oriented to person, place, and time.  BP (!) 143/80 (BP Location: Left Arm, Patient Position: Sitting, Cuff Size: Normal)   Pulse 77   Temp 98.2 F (36.8 C) (Oral)   Resp 18   Ht _0  (1.702 m)   Wt 170 lb (77.1 kg)   SpO2 98%   BMI 26.63 kg/m  Wt Readings from Last 3 Encounters:  01/09/21 170 lb (77.1 kg)  09/06/20 174 lb 3.2 oz (79 kg)  02/07/20 175 lb (79.4 kg)     Health Maintenance Due  Topic Date Due  . FOOT EXAM  10/04/2020  . OPHTHALMOLOGY EXAM  11/09/2020    There are no preventive care reminders to display for this patient.  Lab Results  Component Value Date   TSH 0.986 07/19/2013   Lab Results  Component Value Date   WBC 10.1 06/16/2017   HGB 14.4 06/16/2017   HCT 40.9 06/16/2017   MCV 80.8 06/16/2017   PLT 287 06/16/2017   Lab Results  Component Value Date   NA 143 09/06/2020   K 3.7 09/06/2020   CO2 23 09/06/2020   GLUCOSE 140 (H) 09/06/2020   BUN 16 09/06/2020   CREATININE 1.43 (H) 09/06/2020   BILITOT 0.4 09/06/2020   ALKPHOS 65 09/06/2020   AST 22 09/06/2020   ALT 30 09/06/2020   PROT 7.3 09/06/2020   ALBUMIN 4.6 09/06/2020   CALCIUM 9.8 09/06/2020   ANIONGAP 14 06/16/2017   Lab Results  Component Value Date   CHOL 147 02/07/2020   Lab Results  Component Value Date   HDL 28 (L) 02/07/2020   Lab Results  Component Value Date   LDLCALC 92 02/07/2020   Lab Results  Component Value Date   TRIG 150 (H) 02/07/2020   Lab Results  Component Value Date   CHOLHDL 5.3 (H) 02/07/2020   Lab Results  Component Value Date   HGBA1C 6.5 09/06/2020      Assessment & Plan:   Problem List Items Addressed This Visit   None   Visit Diagnoses    Acute gout involving toe of right foot, unspecified cause    -   Primary   Relevant Medications   indomethacin (INDOCIN) 50 MG capsule   ketorolac (TORADOL) injection 60 mg (Completed)      Meds ordered this encounter  Medications  . indomethacin (INDOCIN) 50 MG capsule    Sig: Take 1 capsule (50 mg total) by mouth 3 (three) times daily with meals.    Dispense:  45 capsule    Refill:  0    Order Specific Question:   Supervising Provider    Answer:   Asencion Noble E [[id number]]  . ketorolac (TORADOL) injection 60 mg  1. Acute gout involving toe of right foot, unspecified cause Trial indomethacin, patient encouraged to increase hydration, patient education given on uric acid testing in the future.  Red flags given for prompt reevaluation. - indomethacin (INDOCIN) 50 MG capsule; Take 1 capsule (50 mg total) by mouth 3 (three) times daily with meals.  Dispense: 45 capsule; Refill: 0 - ketorolac (TORADOL) injection 60 mg   I have reviewed the patient's medical history (PMH, PSH, Social History, Family History, Medications, and allergies) , and have been updated if relevant. I spent 30 minutes reviewing chart and  face to face time with patient.     Follow-up: Return if symptoms worsen or fail to improve.    Loraine Grip Mayers, PA-C

## 2021-01-09 NOTE — Progress Notes (Signed)
Patient has taken medication around 8:40 am. Patient has not eaten today. Patient complains of right great tow pain at a 1`0 for the past 3 weeks described as throbbing and tender. Patient has used colchicine with no relief for the past [redacted] weeks along with black stools from medications.

## 2021-01-09 NOTE — Patient Instructions (Signed)
You will use indomethacin 3 times a day until your symptoms have resolved and then use it for 2 more days.  I encourage you to increase your hydration, and consider having your uric acid levels checked after your acute attack has resolved to see if you are a candidate for a daily medication.  I hope that you feel better soon, please let us know if there is anything else we can do for you.  Kennieth Rad, PA-C Physician Assistant [place] [email or web address]     Uric Acid Test Why am I having this test? Uric acid is a chemical that gets released when certain substances in the body (purines) break down. Normally, uric acid is filtered out of the blood by the kidneys, and then it leaves the body through urine. If your body makes too much uric acid, or if your kidneys are not removing enough of it, uric acid can start to form crystals that build up in your joints or kidneys. Uric acid crystals in your joints can cause a type of arthritis (gout). Crystals in your urine can form kidney stones. You may have a uric acid test:  If you have joint pain or swelling that may be caused by gout.  If you frequently have kidney stones.  To help diagnose or monitor treatment of gout.  To monitor radiation or chemotherapy treatment.  To monitor kidney function or diagnose kidney disorders. What is being tested? This test measures the amount of uric acid in your blood or urine. What kind of sample is taken? This test may be done with a blood sample or a urine sample.  A blood sample is usually collected by inserting a needle into a blood vessel. You may have a blood sample taken if: ? You are receiving treatment that increases purines in your body, such as radiation or chemotherapy. ? You have joint pain that may be caused by gout.  You may have a urine sample taken if you have kidney stones. You may be asked to collect urine samples at home  over a period of 24 hours.   How do I collect samples at home? When collecting a urine sample at home, make sure you:  Use supplies and instructions that you received from the lab.  Collect urine only in the germ-free (sterile) cup that you received from the lab.  Do not let any toilet paper or stool (feces) get into the cup.  Refrigerate the sample until you can return it to the lab.  Return the sample(s) to the lab as instructed. How do I prepare for this test? Follow instructions from your health care provider about:  Eating or drinking restrictions. You may need to stop eating and drinking everything except water starting 4 hours before the test.  Changing or stopping your regular medicines. Some medicines can affect uric acid levels. Tell a health care provider about:  Recent intense exercise. If you have recently exercised a lot, this may affect your test results.  Any allergies you have.  All medicines you are taking, including vitamins, herbs, eye drops, creams, and over-the-counter medicines.  Any blood disorders you have.  Any surgeries you have had.  Any medical conditions you have.  Whether you are pregnant or may be pregnant. How are the results reported? Your results will be reported as a value that indicates how much uric acid is in your blood or urine. Your health care provider will compare your results to normal ranges that were  established after testing a large group of people (reference ranges). Reference ranges may vary among labs and hospitals. For this test, common reference ranges are:  Blood test: ? Adult male: 4.0-8.5 mg/dL or 0.24-0.51 mmol/L. ? Adult male: 2.7-7.3 mg/dL or 0.16-0.43 mmol/L. ? Child: 2.5-5.5 mg/dL or 0.12-0.32 mmol/L. ? Newborn: 2.0-6.2 mg/dL.  Urine test: 250-750 mg/24 hr or 1.48-4.43 mmol/day (SI units). What do the results mean? Results within your reference range are considered normal, meaning that you have a normal amount  of uric acid in your body. Results that are higher than your reference range may mean that:  Your body is making too much uric acid.  Your kidneys are not removing enough uric acid.  Your gout treatment plan needs to be adjusted, if applicable. You may need more tests to determine what is causing high uric acid levels. Possible causes include:  Gout.  Kidney disease.  Cancer or cancer treatment.  A diet high in purines.  Alcohol abuse.  Diabetes (diabetes mellitus). Results that are below your reference range mean that you have too little uric acid in your body. This is usually caused by certain medicines and is usually not serious. Talk with your health care provider about what your results mean. Questions to ask your health care provider Ask your health care provider, or the department that is doing the test:  When will my results be ready?  How will I get my results?  What are my treatment options?  What other tests do I need?  What are my next steps? Summary  Uric acid is a chemical that gets released when certain substances in the body (purines) break down.  If your body makes too much uric acid, or if your kidneys are not removing enough of it, uric acid can start to form crystals that build up in your joints or kidneys.  Uric acid crystals in your joints can cause a type of arthritis (gout). Crystals in your urine can form kidney stones.  This test measures the amount of uric acid in your blood or urine. This information is not intended to replace advice given to you by your health care provider. Make sure you discuss any questions you have with your health care provider. Document Revised: 01/28/2020 Document Reviewed: 01/28/2020 Elsevier Patient Education  2021 [employer].

## 2021-02-24 ENCOUNTER — Other Ambulatory Visit: Payer: Self-pay

## 2021-02-24 ENCOUNTER — Emergency Department (HOSPITAL_COMMUNITY)
Admission: EM | Admit: 2021-02-24 | Discharge: 2021-02-24 | Disposition: A | Discharge status: Home / Self Care | Payer: Medicare Other | Attending: Emergency Medicine | Admitting: Emergency Medicine

## 2021-02-24 DIAGNOSIS — M109 Gout, unspecified: Secondary | ICD-10-CM | POA: Insufficient documentation

## 2021-02-24 DIAGNOSIS — Z7982 Long term (current) use of aspirin: Secondary | ICD-10-CM | POA: Insufficient documentation

## 2021-02-24 DIAGNOSIS — Z79899 Other long term (current) drug therapy: Secondary | ICD-10-CM | POA: Insufficient documentation

## 2021-02-24 DIAGNOSIS — E119 Type 2 diabetes mellitus without complications: Secondary | ICD-10-CM | POA: Diagnosis not present

## 2021-02-24 DIAGNOSIS — Z7984 Long term (current) use of oral hypoglycemic drugs: Secondary | ICD-10-CM | POA: Insufficient documentation

## 2021-02-24 DIAGNOSIS — I10 Essential (primary) hypertension: Secondary | ICD-10-CM | POA: Diagnosis not present

## 2021-02-24 DIAGNOSIS — F17210 Nicotine dependence, cigarettes, uncomplicated: Secondary | ICD-10-CM | POA: Diagnosis not present

## 2021-02-24 DIAGNOSIS — M25571 Pain in right ankle and joints of right foot: Secondary | ICD-10-CM | POA: Diagnosis present

## 2021-02-24 MED ORDER — INDOMETHACIN 50 MG PO CAPS: 50.0000 mg | ORAL_CAPSULE | Freq: Three times a day (TID) | ORAL | 0 refills | Status: AC

## 2021-02-24 MED ORDER — KETOROLAC TROMETHAMINE 15 MG/ML IJ SOLN
30.0000 mg | Freq: Once | INTRAMUSCULAR | Status: AC
  Administered 2021-02-24: 30 mg via INTRAMUSCULAR
  Filled 2021-02-24: qty 2

## 2021-02-24 NOTE — ED Provider Notes (Signed)
[facility] DEPT Provider Note   CSN: [id number] Arrival date & time: 02/24/21  1224     History Chief Complaint  Patient presents with  . Gout  . Medication Refill    Henry Walker is a 60 y.o. male with a past medical history significant for diabetes, GERD, hyperlipidemia, and hypertension who presents to the ED due to right ankle pain and edema x2-3 days. He has a history of gout and notes this feels similar. He typically has gout in his great toe or ankle. Recently treated for a gout flare on 3/15 per chart review and prescribed Indomethacin 53m TID which he ran out of 3 weeks ago. No fever or chills. No injury to ankle. He rates his pain 10/10 on medial aspect of ankle. Pain worse with palpation. No treatment prior to arrival.  History obtained from patient and past medical records. No interpreter used during encounter.      Past Medical History:  Diagnosis Date  . [place]   . GERD (gastroesophageal reflux disease)   . Hx of adenomatous colonic polyps 10/06/2014  . Hyperlipidemia   . Hypertension     Patient Active Problem List   Diagnosis Date Noted  . Acute gout involving toe of right foot 01/09/2021  . [place] ([place] [id number] . Hx of adenomatous colonic polyps 10/06/2014  . Hyperlipidemia 04/12/2014  . GERD (gastroesophageal reflux disease) 04/12/2014  . IFG (impaired fasting glucose) 04/12/2014  . Tobacco use disorder 10/12/2013  . HTN (hypertension) 04/12/2013    Past Surgical History:  Procedure Laterality Date  . BACK SURGERY    . COLONOSCOPY         Family History  Problem Relation Age of Onset  . Colon cancer Neg Hx   . Esophageal cancer Neg Hx   . Rectal cancer Neg Hx   . Stomach cancer Neg Hx   . Colon polyps Neg Hx     Social History   Tobacco Use  . Smoking status: Current Every Day Smoker    Packs/day: 0.50    Types:  Cigarettes  . Smokeless tobacco: Never Used  Vaping Use  . Vaping Use: Never used  Substance Use Topics  . Alcohol use: No    Alcohol/week: 0.0 standard drinks  . Drug use: No    Home Medications Prior to Admission medications   Medication Sig Start Date End Date Taking? Authorizing Provider  indomethacin (INDOCIN) 50 MG capsule Take 1 capsule (50 mg total) by mouth 3 (three) times daily with meals for 5 days. 02/24/21 03/01/21 Yes Maitlyn Penza, CDruscilla Brownie PA-C  amLODipine (NORVASC) 10 MG tablet Take 1 tablet (10 mg total) by mouth daily. 09/06/20   NCharlott Rakes MD  aspirin EC 81 MG tablet Take 1 tablet (81 mg total) by mouth daily. Patient not taking: No sig reported 12/04/16   LLottie MusselT, MD  atorvastatin (LIPITOR) 40 MG tablet Take 1 tablet (40 mg total) by mouth daily. 09/06/20   NCharlott Rakes MD  Blood Glucose Monitoring Suppl (ONE TOUCH ULTRA 2) w/Device KIT Use as directed 3 times daily before meals. 07/06/19   NCharlott Rakes MD  clotrimazole (LOTRIMIN) 1 % cream Apply 1 application topically 2 (two) times daily. Patient not taking: No sig reported 10/05/19   NCharlott Rakes MD  colchicine 0.6 MG tablet Take 2 tabs (1.2 mg) at the onset of a gout attack, may repeat 1 tablet (0.6 mg) after 1 hour if  symptoms persist 12/11/20   Charlott Rakes, MD  glucose blood (ONETOUCH ULTRA) test strip Use as instructed 3 times daily 07/06/19   Charlott Rakes, MD  indomethacin (INDOCIN) 50 MG capsule Take 1 capsule (50 mg total) by mouth 3 (three) times daily with meals. 01/09/21   Mayers, Loraine Grip, PA-C  Lancet Devices (ACCU-CHEK [place]) lancets Use as instructed 08/04/15   Lance Bosch, NP  Lancets [facility] ULTRASOFT) lancets Use as instructed 3 times daily 07/06/19   Charlott Rakes, MD  lisinopril-hydrochlorothiazide (ZESTORETIC) 20-25 MG tablet Take 1 tablet by mouth daily. 09/06/20   Charlott Rakes, MD  metFORMIN (GLUCOPHAGE-XR) 500 MG 24 hr tablet TAKE 1 TABLET(500 MG) BY MOUTH DAILY  WITH BREAKFAST 09/06/20   Charlott Rakes, MD  ofloxacin (FLOXIN) 0.3 % OTIC solution Place 10 drops daily into the left ear. For 7 days. 09/11/17   Alfonse Spruce, FNP  omega-3 acid ethyl esters (LOVAZA) 1 g capsule TAKE 1 CAPSULE BY MOUTH TWICE DAILY Patient not taking: Reported on 01/09/2021 06/11/19   Charlott Rakes, MD  pantoprazole (PROTONIX) 40 MG tablet Take 1 tablet (40 mg total) by mouth daily. 09/06/20   Charlott Rakes, MD  sildenafil (VIAGRA) 50 MG tablet Take 1 tablet (50 mg total) by mouth daily as needed for erectile dysfunction. At least 24 hours between doses Patient not taking: No sig reported 10/06/18   Charlott Rakes, MD  tadalafil (CIALIS) 20 MG tablet Take 0.5-1 tablets (10-20 mg total) by mouth every other day as needed for erectile dysfunction. Patient not taking: Reported on 09/19/2014 07/21/14 07/17/15  Lorayne Marek, MD    Allergies    Patient has no known allergies.  Review of Systems   Review of Systems  Constitutional: Negative for chills and fever.  Musculoskeletal: Positive for arthralgias and joint swelling.  All other systems reviewed and are negative.   Physical Exam Updated Vital Signs BP 137/90 (BP Location: Left Arm)   Pulse 76   Temp 98.8 F (37.1 C) (Oral)   Resp 16   Ht '5\' 7"'  (1.702 m)   Wt 78 kg   SpO2 98%   BMI 26.94 kg/m   Physical Exam Vitals and nursing note reviewed.  Constitutional:      General: He is not in acute distress.    Appearance: He is not ill-appearing.  HENT:     Head: Normocephalic.  Eyes:     Pupils: Pupils are equal, round, and reactive to light.  Cardiovascular:     Rate and Rhythm: Normal rate and regular rhythm.     Pulses: Normal pulses.     Heart sounds: Normal heart sounds. No murmur heard. No friction rub. No gallop.   Pulmonary:     Effort: Pulmonary effort is normal.     Breath sounds: Normal breath sounds.  Abdominal:     General: Abdomen is flat. There is no distension.     Palpations:  Abdomen is soft.     Tenderness: There is no abdominal tenderness. There is no guarding or rebound.  Musculoskeletal:        General: Normal range of motion.     Cervical back: Neck supple.     Comments: Tenderness throughout medial aspect of right ankle with mild edema. Full ROM of right ankle, toes, and knee. Pedal pulses palpable. Soft compartments.   Skin:    General: Skin is warm and dry.  Neurological:     General: No focal deficit present.     Mental Status:  He is alert.  Psychiatric:        Mood and Affect: Mood normal.        Behavior: Behavior normal.     ED Results / Procedures / Treatments   Labs (all labs ordered are listed, but only abnormal results are displayed) Labs Reviewed - No data to display  EKG None  Radiology No results found.  Procedures Procedures   Medications Ordered in ED Medications  ketorolac (TORADOL) 15 MG/ML injection 30 mg (has no administration in time range)    ED Course  I have reviewed the triage vital signs and the nursing notes.  Pertinent labs & imaging results that were available during my care of the patient were reviewed by me and considered in my medical decision making (see chart for details).    MDM Rules/Calculators/A&P                         60 year old male with history of gout presents to ED due to right ankle pain consistent with gout flare. No fever or chills. Vitals all within normal limits upon arrival. Tenderness throughout medial aspect of right ankle with mild edema. Full ROM of ankle. Low suspicion for septic joint. Toradol given here in the ED. Indomethacin prescription refilled. Suspect symptoms related to gout flare. Advised patient to follow-up with PCP if symptoms do not improve over the next few days. Strict ED precautions discussed with patient. Patient states understanding and agrees to plan. Patient discharged home in no acute distress and stable vitals.  Final Clinical Impression(s) / ED  Diagnoses Final diagnoses:  Acute gout of right ankle, unspecified cause    Rx / DC Orders ED Discharge Orders         Ordered    indomethacin (INDOCIN) 50 MG capsule  3 times daily with meals        02/24/21 1329           Suzy Bouchard, [place] 02/24/21 Mayes, Ankit, MD 02/25/21 1808

## 2021-02-24 NOTE — Discharge Instructions (Addendum)
It appears you are having a gout flare. I have refilled your Indomethacin. Take as prescribed. Please follow-up with your PCP if symptoms do not improve over the next few days. Return to the ER if you develop fever or worsening symptoms.

## 2021-02-24 NOTE — ED Triage Notes (Signed)
Patient reports gout flare up for 2-3 days. Pain 10/10, right foot/ankle. Patient has prescription for indomethacin 50mg  TID but ran out of medication 3 weeks ago. Patient says this medication may not be strong enough. Patient is requesting refill of medication in addition to something for pain.

## 2021-03-09 ENCOUNTER — Ambulatory Visit: Payer: Self-pay | Admitting: *Deleted

## 2021-03-09 ENCOUNTER — Emergency Department (HOSPITAL_COMMUNITY): Payer: Medicare Other

## 2021-03-09 ENCOUNTER — Encounter (HOSPITAL_COMMUNITY): Payer: Self-pay

## 2021-03-09 ENCOUNTER — Emergency Department (HOSPITAL_COMMUNITY)
Admission: EM | Admit: 2021-03-09 | Discharge: 2021-03-09 | Disposition: A | Discharge status: Home / Self Care | Payer: Medicare Other | Attending: Emergency Medicine | Admitting: Emergency Medicine

## 2021-03-09 ENCOUNTER — Other Ambulatory Visit: Payer: Self-pay

## 2021-03-09 DIAGNOSIS — Z7982 Long term (current) use of aspirin: Secondary | ICD-10-CM | POA: Diagnosis not present

## 2021-03-09 DIAGNOSIS — I10 Essential (primary) hypertension: Secondary | ICD-10-CM | POA: Diagnosis not present

## 2021-03-09 DIAGNOSIS — F17210 Nicotine dependence, cigarettes, uncomplicated: Secondary | ICD-10-CM | POA: Diagnosis not present

## 2021-03-09 DIAGNOSIS — E119 Type 2 diabetes mellitus without complications: Secondary | ICD-10-CM | POA: Diagnosis not present

## 2021-03-09 DIAGNOSIS — M10071 Idiopathic gout, right ankle and foot: Secondary | ICD-10-CM | POA: Insufficient documentation

## 2021-03-09 DIAGNOSIS — Z79899 Other long term (current) drug therapy: Secondary | ICD-10-CM | POA: Insufficient documentation

## 2021-03-09 DIAGNOSIS — Z7984 Long term (current) use of oral hypoglycemic drugs: Secondary | ICD-10-CM | POA: Insufficient documentation

## 2021-03-09 DIAGNOSIS — M109 Gout, unspecified: Secondary | ICD-10-CM

## 2021-03-09 DIAGNOSIS — M79671 Pain in right foot: Secondary | ICD-10-CM | POA: Diagnosis not present

## 2021-03-09 DIAGNOSIS — M79674 Pain in right toe(s): Secondary | ICD-10-CM | POA: Diagnosis present

## 2021-03-09 HISTORY — DX: Gout, unspecified: M10.9

## 2021-03-09 MED ORDER — INDOMETHACIN 50 MG PO CAPS: 50.0000 mg | ORAL_CAPSULE | Freq: Three times a day (TID) | ORAL | 0 refills | Status: DC

## 2021-03-09 MED ORDER — KETOROLAC TROMETHAMINE 30 MG/ML IJ SOLN
15.0000 mg | Freq: Once | INTRAMUSCULAR | Status: AC
  Administered 2021-03-09: 15 mg via INTRAMUSCULAR
  Filled 2021-03-09: qty 1

## 2021-03-09 NOTE — Discharge Instructions (Addendum)
Take indomethacin as prescribed.  Follow-up with your doctor as scheduled on Monday. Your creatinine was slightly increased on your prior labs from November.  When this happens, you need to be careful with NSAID medications including indomethacin (this also includes Advil, ibuprofen, Aleve).  Do not take any additional NSAIDs while taking indomethacin.  Follow-up with your doctor so they can recheck your lab work.

## 2021-03-09 NOTE — Telephone Encounter (Signed)
Patient was given sooner appt with Dr. Margarita Rana.

## 2021-03-09 NOTE — Telephone Encounter (Signed)
Per initial encounter, "Pt has a history gout and went to er on 02-24-2021 and last night had gout flared up. Pt does not want to go to mobile tomorrow at [facility] and there is no openings at [facility]"; contacted pt regarding his symptoms; the pt says he has flare ups of gout app every 2 weeks; he was seen in the ED on 02/24/21 and finished his last dose of medication on; his right great toe and joint are effected; he can barely walk on his right foot; his pain is rated 7 out of 10; the pt is not sure if he can wear a shoe; the pt says he went the mobile unit and [facility] but his symptoms and flares after intervention; he gets about a week of relief; the pt would like to know if the milligrams of the indomethican can be increased; recommendations made per nurse triage protocol; he verbalized understanding and will go to Urgent Care today because of his discomfort; decision tree completed; pt offered and accepted appt with Dr Margarita Rana, [facility], on 05/21/21 at 1330; pt also placed on wait list; he can be contacted at [phone]; will route to office for notification and schedule change.  Reason for Disposition . [1] SEVERE pain (e.g., excruciating, unable to do any normal activities) AND [2] not improved after 2 hours of pain medicine  Answer Assessment - Initial Assessment Questions 1. ONSET: "When did the pain start?"      03/05/21 2. LOCATION: "Where is the pain located?"     Right great toe and joint 3. PAIN: "How bad is the pain?"    (Scale 1-10; or mild, moderate, severe)  - MILD (1-3): doesn't interfere with normal activities.   - MODERATE (4-7): interferes with normal activities (e.g., work or school) or awakens from sleep, limping.   - SEVERE (8-10): excruciating pain, unable to do any normal activities, unable to walk.     7 out of 10 4. WORK OR EXERCISE: "Has there been any recent work or exercise that involved this part of the body?"      no 5. CAUSE: "What do you think is causing  the foot pain?"  gout 6. OTHER SYMPTOMS: "Do you have any other symptoms?" (e.g., leg pain, rash, fever, numbness)    No 7. PREGNANCY: "Is there any chance you are pregnant?" "When was your last menstrual period?"     n/a  Protocols used: FOOT PAIN-A-AH

## 2021-03-09 NOTE — ED Provider Notes (Signed)
[place] DEPT Provider Note   CSN: [id number] Arrival date & time: 03/09/21  1033     History Chief Complaint  Patient presents with  . Toe Pain    Henry Walker is a 60 y.o. male.  60 year old male with complaint of right great toe pain onset 2 days ago after completing Indomethacin for recent gout flare. Denies recent injuries to the foot, similar to prior gout flares. No new complaints or concerns. Tried Colchicine at home without any relief. Scheduled to follow up with PCP on Monday.         Past Medical History:  Diagnosis Date  . Diabetes mellitus without complication ([place])   . GERD (gastroesophageal reflux disease)   . Gout   . Hx of adenomatous colonic polyps 10/06/2014  . Hyperlipidemia   . Hypertension     Patient Active Problem List   Diagnosis Date Noted  . Acute gout involving toe of right foot 01/09/2021  . Diabetes mellitus without complication ([place]) 97/35/3299  . Hx of adenomatous colonic polyps 10/06/2014  . Hyperlipidemia 04/12/2014  . GERD (gastroesophageal reflux disease) 04/12/2014  . IFG (impaired fasting glucose) 04/12/2014  . Tobacco use disorder 10/12/2013  . HTN (hypertension) 04/12/2013    Past Surgical History:  Procedure Laterality Date  . BACK SURGERY    . COLONOSCOPY         Family History  Problem Relation Age of Onset  . Colon cancer Neg Hx   . Esophageal cancer Neg Hx   . Rectal cancer Neg Hx   . Stomach cancer Neg Hx   . Colon polyps Neg Hx     Social History   Tobacco Use  . Smoking status: Current Every Day Smoker    Packs/day: 0.50    Types: Cigarettes  . Smokeless tobacco: Never Used  Vaping Use  . Vaping Use: Never used  Substance Use Topics  . Alcohol use: No    Alcohol/week: 0.0 standard drinks  . Drug use: No    Home Medications Prior to Admission medications   Medication Sig Start Date End Date Taking? Authorizing Provider  amLODipine (NORVASC) 10 MG  tablet Take 1 tablet (10 mg total) by mouth daily. 09/06/20   Charlott Rakes, MD  aspirin EC 81 MG tablet Take 1 tablet (81 mg total) by mouth daily. Patient not taking: No sig reported 12/04/16   Lottie Mussel T, MD  atorvastatin (LIPITOR) 40 MG tablet Take 1 tablet (40 mg total) by mouth daily. 09/06/20   Charlott Rakes, MD  Blood Glucose Monitoring Suppl (ONE TOUCH ULTRA 2) w/Device KIT Use as directed 3 times daily before meals. 07/06/19   Charlott Rakes, MD  clotrimazole (LOTRIMIN) 1 % cream Apply 1 application topically 2 (two) times daily. Patient not taking: No sig reported 10/05/19   Charlott Rakes, MD  colchicine 0.6 MG tablet Take 2 tabs (1.2 mg) at the onset of a gout attack, may repeat 1 tablet (0.6 mg) after 1 hour if symptoms persist 12/11/20   Charlott Rakes, MD  glucose blood (ONETOUCH ULTRA) test strip Use as instructed 3 times daily 07/06/19   Charlott Rakes, MD  indomethacin (INDOCIN) 50 MG capsule Take 1 capsule (50 mg total) by mouth 3 (three) times daily with meals for 4 days. 03/09/21 03/13/21  Tacy Learn, PA-C  Lancet Devices [facility]) lancets Use as instructed 08/04/15   Lance Bosch, NP  Lancets [facility]) lancets Use as instructed 3 times daily  07/06/19   Charlott Rakes, MD  lisinopril-hydrochlorothiazide (ZESTORETIC) 20-25 MG tablet Take 1 tablet by mouth daily. 09/06/20   Charlott Rakes, MD  metFORMIN (GLUCOPHAGE-XR) 500 MG 24 hr tablet TAKE 1 TABLET(500 MG) BY MOUTH DAILY WITH BREAKFAST 09/06/20   Charlott Rakes, MD  ofloxacin (FLOXIN) 0.3 % OTIC solution Place 10 drops daily into the left ear. For 7 days. 09/11/17   Alfonse Spruce, FNP  omega-3 acid ethyl esters (LOVAZA) 1 g capsule TAKE 1 CAPSULE BY MOUTH TWICE DAILY Patient not taking: Reported on 01/09/2021 06/11/19   Charlott Rakes, MD  pantoprazole (PROTONIX) 40 MG tablet Take 1 tablet (40 mg total) by mouth daily. 09/06/20   Charlott Rakes, MD  sildenafil (VIAGRA) 50 MG tablet  Take 1 tablet (50 mg total) by mouth daily as needed for erectile dysfunction. At least 24 hours between doses Patient not taking: No sig reported 10/06/18   Charlott Rakes, MD  tadalafil (CIALIS) 20 MG tablet Take 0.5-1 tablets (10-20 mg total) by mouth every other day as needed for erectile dysfunction. Patient not taking: Reported on 09/19/2014 07/21/14 07/17/15  Lorayne Marek, MD    Allergies    Patient has no known allergies.  Review of Systems   Review of Systems  Constitutional: Negative for fever.  Musculoskeletal: Positive for arthralgias and joint swelling.  Skin: Positive for color change. Negative for rash and wound.  Allergic/Immunologic: Negative for immunocompromised state.  Neurological: Negative for weakness and numbness.    Physical Exam Updated Vital Signs BP (!) 142/77 (BP Location: Left Arm)   Pulse 77   Temp 97.9 F (36.6 C) (Oral)   Resp 16   Ht _0  (1.702 m)   Wt 78 kg   SpO2 97%   BMI 26.94 kg/m   Physical Exam Vitals and nursing note reviewed.  Constitutional:      General: He is not in acute distress.    Appearance: He is well-developed. He is not diaphoretic.  HENT:     Head: Normocephalic and atraumatic.  Cardiovascular:     Pulses: Normal pulses.  Pulmonary:     Effort: Pulmonary effort is normal.  Musculoskeletal:        General: Swelling and tenderness present. No deformity or signs of injury.       Legs:  Skin:    General: Skin is warm and dry.     Findings: Erythema present. No bruising or rash.  Neurological:     Mental Status: He is alert and oriented to person, place, and time.  Psychiatric:        Behavior: Behavior normal.     ED Results / Procedures / Treatments   Labs (all labs ordered are listed, but only abnormal results are displayed) Labs Reviewed - No data to display  EKG None  Radiology DG Foot Complete Right  Result Date: 03/09/2021 CLINICAL DATA:  First MTP pain, history of gout EXAM: RIGHT FOOT  COMPLETE - 3+ VIEW COMPARISON:  None. FINDINGS: No acute fracture or dislocation. No significant degenerative changes. There are small juxta-articular erosions on the medial aspect of the head of the first metatarsal and lateral aspect of the base of the first proximal phalanx. No unexpected radiopaque foreign body. Soft tissues are unremarkable. IMPRESSION: Mild juxta-articular erosions at the first MTP are consistent with history of gout. Electronically Signed   By: Valentino Saxon MD   On: 03/09/2021 12:58    Procedures Procedures   Medications Ordered in ED Medications  ketorolac (TORADOL)  30 MG/ML injection 15 mg (has no administration in time range)    ED Course  I have reviewed the triage vital signs and the nursing notes.  Pertinent labs & imaging results that were available during my care of the patient were reviewed by me and considered in my medical decision making (see chart for details).  Clinical Course as of 03/09/21 1330  Fri Mar 09, 6514  429 60 year old male with history of gout presents with complaint of recurrent flare in right great toe without injury.  X-ray consistent with gouty arthritis.  On exam found to have mild erythema with mild swelling and tenderness of the right first MTP.  Patient denies history of chronic kidney disease.  Requests injection of Toradol for pain control and refill of his indomethacin.  Reviewed labs with patient, creatinine with slight upward trend last reported in November 2021, recommend recheck with PCP. [LM]    Clinical Course User Index [LM] Roque Lias   MDM Rules/Calculators/A&P                          Final Clinical Impression(s) / ED Diagnoses Final diagnoses:  Gout of big toe    Rx / DC Orders ED Discharge Orders         Ordered    indomethacin (INDOCIN) 50 MG capsule  3 times daily with meals        03/09/21 1316           Roque Lias 03/09/21 1330    Valarie Merino, MD 03/10/21  2316

## 2021-03-09 NOTE — ED Provider Notes (Signed)
Emergency Medicine Provider Triage Evaluation Note  [facility] , a 60 y.o. male  was evaluated in triage.  Pt complains of gout.  Patient reports right first MTP pain began yesterday.  He reports history of pain in this area which is previously diagnosed as gout by PCP.  Patient reports that he was having pain 2 weeks ago of this toe and was prescribed indomethacin from the ER which temporarily resolved his problem.  Pain flare returned yesterday.  Denies fall/injury, numbness/tingling, weakness, fever/chills or any additional concerns  Review of Systems  Positive: Toe pain, swelling Negative: Fever/chills, nausea/vomiting, numbness/tingling, injury  Physical Exam  BP (!) 142/77 (BP Location: Left Arm)   Pulse 77   Temp 97.9 F (36.6 C) (Oral)   Resp 16   SpO2 97%  Gen:   Awake, no distress  Resp:  Normal effort MSK:   Moves extremities without difficulty.  Moderate swelling of the first right MTP pain with palpation and movement. Other:  Capillary refill sensation intact to toes.  Strong equal pedal pulses  Medical Decision Making  Medically screening exam initiated at 11:55 AM.  Appropriate orders placed.  [employer] was informed that the remainder of the evaluation will be completed by another provider, this initial triage assessment does not replace that evaluation, and the importance of remaining in the ED until their evaluation is complete.  Right first MTP pain and swelling onset 1 day ago.  Similar to previous gout flare 2 weeks ago.  Atraumatic.  No infectious type symptoms.  Neurovascular intact.  Will obtain x-ray.   Note: Portions of this report may have been transcribed using voice recognition software. Every effort was made to ensure accuracy; however, inadvertent computerized transcription errors may still be present.    Gari Crown 03/09/21 1157    Valarie Merino, MD 03/10/21 [phone]

## 2021-03-09 NOTE — ED Triage Notes (Signed)
Patient c/o gout of the right big toe since last night. Patient states he just finished taking a round medication x 2 weeks.

## 2021-03-12 ENCOUNTER — Encounter: Payer: Self-pay | Admitting: Family Medicine

## 2021-03-12 ENCOUNTER — Ambulatory Visit: Payer: Medicare Other | Attending: Family Medicine | Admitting: Family Medicine

## 2021-03-12 ENCOUNTER — Other Ambulatory Visit: Payer: Self-pay

## 2021-03-12 DIAGNOSIS — M1A0710 Idiopathic chronic gout, right ankle and foot, without tophus (tophi): Secondary | ICD-10-CM

## 2021-03-12 DIAGNOSIS — E1169 Type 2 diabetes mellitus with other specified complication: Secondary | ICD-10-CM | POA: Diagnosis not present

## 2021-03-12 DIAGNOSIS — E1159 Type 2 diabetes mellitus with other circulatory complications: Secondary | ICD-10-CM | POA: Diagnosis not present

## 2021-03-12 DIAGNOSIS — I152 Hypertension secondary to endocrine disorders: Secondary | ICD-10-CM | POA: Diagnosis not present

## 2021-03-12 DIAGNOSIS — K219 Gastro-esophageal reflux disease without esophagitis: Secondary | ICD-10-CM

## 2021-03-12 DIAGNOSIS — E785 Hyperlipidemia, unspecified: Secondary | ICD-10-CM

## 2021-03-12 MED ORDER — METFORMIN HCL ER 500 MG PO TB24: ORAL_TABLET | ORAL | 1 refills | Status: DC

## 2021-03-12 MED ORDER — AMLODIPINE BESYLATE 10 MG PO TABS: 10.0000 mg | ORAL_TABLET | Freq: Every day | ORAL | 1 refills | Status: DC

## 2021-03-12 MED ORDER — PANTOPRAZOLE SODIUM 40 MG PO TBEC: 40.0000 mg | DELAYED_RELEASE_TABLET | Freq: Every day | ORAL | 1 refills | Status: DC

## 2021-03-12 MED ORDER — LISINOPRIL 40 MG PO TABS: 40.0000 mg | ORAL_TABLET | Freq: Every day | ORAL | 1 refills | Status: DC

## 2021-03-12 MED ORDER — ALLOPURINOL 100 MG PO TABS
100.0000 mg | ORAL_TABLET | Freq: Every day | ORAL | 6 refills | Status: DC
Start: 2021-03-12 — End: 2021-09-12

## 2021-03-12 MED ORDER — ATORVASTATIN CALCIUM 40 MG PO TABS: 40.0000 mg | ORAL_TABLET | Freq: Every day | ORAL | 1 refills | Status: DC

## 2021-03-12 NOTE — Patient Instructions (Signed)
Gout  Gout is painful swelling of your joints. Gout is a type of arthritis. It is caused by having too much uric acid in your body. Uric acid is a chemical that is made when your body breaks down substances called purines. If your body has too much uric acid, sharp crystals can form and build up in your joints. This causes pain and swelling. Gout attacks can happen quickly and be very painful (acute gout). Over time, the attacks can affect more joints and happen more often (chronic gout). What are the causes?  Too much uric acid in your blood. This can happen because: ? Your kidneys do not remove enough uric acid from your blood. ? Your body makes too much uric acid. ? You eat too many foods that are high in purines. These foods include organ meats, some seafood, and beer.  Trauma or stress. What increases the risk?  Having a family history of gout.  Being male and middle-aged.  Being male and having gone through menopause.  Being very overweight (obese).  Drinking alcohol, especially beer.  Not having enough water in the body (being dehydrated).  Losing weight too quickly.  Having an organ transplant.  Having lead poisoning.  Taking certain medicines.  Having kidney disease.  Having a skin condition called psoriasis. What are the signs or symptoms? An attack of acute gout usually happens in just one joint. The most common place is the big toe. Attacks often start at night. Other joints that may be affected include joints of the feet, ankle, knee, fingers, wrist, or elbow. Symptoms of an attack may include:  Very bad pain.  Warmth.  Swelling.  Stiffness.  Shiny, red, or purple skin.  Tenderness. The affected joint may be very painful to touch.  Chills and fever. Chronic gout may cause symptoms more often. More joints may be involved. You may also have white or yellow lumps (tophi) on your hands or feet or in other areas near your joints.   How is this  treated?  Treatment for this condition has two phases: treating an acute attack and preventing future attacks.  Acute gout treatment may include: ? NSAIDs. ? Steroids. These are taken by mouth or injected into a joint. ? Colchicine. This medicine relieves pain and swelling. It can be given by mouth or through an IV tube.  Preventive treatment may include: ? Taking small doses of NSAIDs or colchicine daily. ? Using a medicine that reduces uric acid levels in your blood. ? Making changes to your diet. You may need to see a food expert (dietitian) about what to eat and drink to prevent gout. Follow these instructions at home: During a gout attack  If told, put ice on the painful area: ? Put ice in a plastic bag. ? Place a towel between your skin and the bag. ? Leave the ice on for 20 minutes, 2-3 times a day.  Raise (elevate) the painful joint above the level of your heart as often as you can.  Rest the joint as much as possible. If the joint is in your leg, you may be given crutches.  Follow instructions from your doctor about what you cannot eat or drink.   Avoiding future gout attacks  Eat a low-purine diet. Avoid foods and drinks such as: ? Liver. ? Kidney. ? Anchovies. ? Asparagus. ? Herring. ? Mushrooms. ? Mussels. ? Beer.  Stay at a healthy weight. If you want to lose weight, talk with your doctor. Do   not lose weight too fast.  Start or continue an exercise plan as told by your doctor. Eating and drinking  Drink enough fluids to keep your pee (urine) pale yellow.  If you drink alcohol: ? Limit how much you use to:  0-1 drink a day for women.  0-2 drinks a day for men. ? Be aware of how much alcohol is in your drink. In the [place] one drink equals one 12 oz bottle of beer (355 mL), one 5 oz glass of wine (148 mL), or one 1 oz glass of hard liquor (44 mL). General instructions  Take over-the-counter and prescription medicines only as told by your doctor.  Do  not drive or use heavy machinery while taking prescription pain medicine.  Return to your normal activities as told by your doctor. Ask your doctor what activities are safe for you.  Keep all follow-up visits as told by your doctor. This is important. Contact a doctor if:  You have another gout attack.  You still have symptoms of a gout attack after 10 days of treatment.  You have problems (side effects) because of your medicines.  You have chills or a fever.  You have burning pain when you pee (urinate).  You have pain in your lower back or belly. Get help right away if:  You have very bad pain.  Your pain cannot be controlled.  You cannot pee. Summary  Gout is painful swelling of the joints.  The most common site of pain is the big toe, but it can affect other joints.  Medicines and avoiding some foods can help to prevent and treat gout attacks. This information is not intended to replace advice given to you by your health care provider. Make sure you discuss any questions you have with your health care provider. Document Revised: 05/06/2018 Document Reviewed: 05/06/2018 Elsevier Patient Education  2021 [employer]  

## 2021-03-12 NOTE — Progress Notes (Signed)
 Established Patient Office Visit  Subjective:  Patient ID: Henry Walker, male    DOB: 03/04/1961  Age: 59 y.o. MRN: [id number]  CC:  Chief Complaint  Patient presents with  . Gout    HPI Henry Walker is a 59-year-old male with a history of type 2 diabetes mellitus (last A1c on 09/06/2020 of 6.5), hypertension, hyperlipidemia, GERD, gout, and tobacco abusewho presents for follow-up visit. In the interval, Henry Walker has had 2 visits to the ED due to gout flares. Currently taking indomethacin for his gout. He saw the ophthalmologist on 10/2020 which showed age-related nuclear cataracts bilaterally.   He reports full medicine compliance. Last visit, his diabetes was controlled with an A1c of 6. Blood was drawn for an A1c due to supply outage for point of care tests. For his hypertension, his BP was at 149/76 and reports taking his blood pressure medicine today. This has been elevated on several other occasions but has been in pain and smoked before coming in today.  For his gout, he is currently taking 50 mg indomethacin, and discontinued colchicine. His gout only occurs in his right toe. He says yesterday, his right toe throbbed a little bit and was more swollen than the left toe. He says as soon as he finishes the indomethacin, a gout flare occurs. It has been happening every other week or every 3 weeks and does not eat red meat or fish very often and is confused why the flares keep happening. His father also had gout. He has occasional numbness and tingling in his right toe.   Tobacco: Half pack a day. Understands that we are here when he is ready to quit.   Acute concerns: Wants to get the COVID booster    Past Medical History:  Diagnosis Date  . Diabetes mellitus without complication (HCC)   . GERD (gastroesophageal reflux disease)   . Gout   . Hx of adenomatous colonic polyps 10/06/2014  . Hyperlipidemia   . Hypertension     Past Surgical History:   Procedure Laterality Date  . BACK SURGERY    . COLONOSCOPY      Family History  Problem Relation Age of Onset  . Colon cancer Neg Hx   . Esophageal cancer Neg Hx   . Rectal cancer Neg Hx   . Stomach cancer Neg Hx   . Colon polyps Neg Hx     Social History   Socioeconomic History  . Marital status: Divorced    Spouse name: Not on file  . Number of children: Not on file  . Years of education: Not on file  . Highest education level: Not on file  Occupational History  . Not on file  Tobacco Use  . Smoking status: Current Every Day Smoker    Packs/day: 0.50    Types: Cigarettes  . Smokeless tobacco: Never Used  Vaping Use  . Vaping Use: Never used  Substance and Sexual Activity  . Alcohol use: No    Alcohol/week: 0.0 standard drinks  . Drug use: No  . Sexual activity: Not Currently  Other Topics Concern  . Not on file  Social History Narrative   Retired underground locator (gas, electric, cable)   Social Determinants of Health   Financial Resource Strain: Not on file  Food Insecurity: Not on file  Transportation Needs: Not on file  Physical Activity: Not on file  Stress: Not on file  Social Connections: Not on file  Intimate Partner Violence: Not on   file    Outpatient Medications Prior to Visit  Medication Sig Dispense Refill  . Blood Glucose Monitoring Suppl (ONE TOUCH ULTRA 2) w/Device KIT Use as directed 3 times daily before meals. 1 kit 0  . colchicine 0.6 MG tablet Take 2 tabs (1.2 mg) at the onset of a gout attack, may repeat 1 tablet (0.6 mg) after 1 hour if symptoms persist 30 tablet 0  . glucose blood (ONETOUCH ULTRA) test strip Use as instructed 3 times daily 100 each 12  . Lancet Devices (ACCU-CHEK SOFTCLIX) lancets Use as instructed 1 each 0  . Lancets (ONETOUCH ULTRASOFT) lancets Use as instructed 3 times daily 100 each 12  . ofloxacin (FLOXIN) 0.3 % OTIC solution Place 10 drops daily into the left ear. For 7 days. 5 mL 0  . amLODipine (NORVASC)  10 MG tablet Take 1 tablet (10 mg total) by mouth daily. 90 tablet 1  . atorvastatin (LIPITOR) 40 MG tablet Take 1 tablet (40 mg total) by mouth daily. 90 tablet 1  . indomethacin (INDOCIN) 50 MG capsule Take 1 capsule (50 mg total) by mouth 3 (three) times daily with meals for 4 days. 12 capsule 0  . lisinopril-hydrochlorothiazide (ZESTORETIC) 20-25 MG tablet Take 1 tablet by mouth daily. 90 tablet 1  . metFORMIN (GLUCOPHAGE-XR) 500 MG 24 hr tablet TAKE 1 TABLET(500 MG) BY MOUTH DAILY WITH BREAKFAST 90 tablet 1  . pantoprazole (PROTONIX) 40 MG tablet Take 1 tablet (40 mg total) by mouth daily. 90 tablet 1  . aspirin EC 81 MG tablet Take 1 tablet (81 mg total) by mouth daily. (Patient not taking: No sig reported) 30 tablet 11  . clotrimazole (LOTRIMIN) 1 % cream Apply 1 application topically 2 (two) times daily. (Patient not taking: No sig reported) 30 g 0  . omega-3 acid ethyl esters (LOVAZA) 1 g capsule TAKE 1 CAPSULE BY MOUTH TWICE DAILY (Patient not taking: No sig reported) 60 capsule 2  . sildenafil (VIAGRA) 50 MG tablet Take 1 tablet (50 mg total) by mouth daily as needed for erectile dysfunction. At least 24 hours between doses (Patient not taking: No sig reported) 10 tablet 3   Facility-Administered Medications Prior to Visit  Medication Dose Route Frequency Provider Last Rate Last Admin  . 0.9 %  sodium chloride infusion  500 mL Intravenous Once Gatha Mayer, MD      . carbamide peroxide (DEBROX) 6.5 % otic solution 5 drop  5 drop Left EAR Once Langeland, Dawn T, MD        No Known Allergies  ROS Review of Systems Constitutional: Negative for activity change and appetite change.  HENT: Negative for sinus pressure and sore throat.   Eyes: Negative for visual disturbance.  Respiratory: Negative for cough, chest tightness and shortness of breath.   Cardiovascular: Negative for chest pain and leg swelling.  Gastrointestinal: Negative for abdominal distention, abdominal pain,  constipation and diarrhea.  Endocrine: Negative.   Genitourinary: Negative for dysuria.  Musculoskeletal: Positive for right 1st MTP joint swelling. No myalgias.  Skin: Negative for rash.  Allergic/Immunologic: Negative.   Neurological: Negative for weakness, light-headedness and numbness.  Psychiatric/Behavioral: Negative for dysphoric mood and suicidal ideas.    Objective:    Physical Exam  Constitutional:      Appearance: He is well-developed.  Neck:     Vascular: No JVD.  Cardiovascular:     Rate and Rhythm: Normal rate.     Heart sounds: Normal heart sounds. No murmur heard.  Pulmonary:     Effort: Pulmonary effort is normal.     Breath sounds: Normal breath sounds. No wheezing or rales.  Chest:     Chest wall: No tenderness.  Abdominal:     General: Bowel sounds are normal. There is no distension.     Palpations: Abdomen is soft. There is no mass.     Tenderness: There is no abdominal tenderness.  Musculoskeletal:        General: Normal range of motion.     Right lower leg: Swelling of big toe MTP joint.     Left lower leg: No edema.  Neurological:     Mental Status: He is alert and oriented to person, place, and time.  Psychiatric:        Mood and Affect: Mood normal.   Diabetic Foot Exam - Simple   Simple Foot Form Diabetic Foot exam was performed with the following findings: Yes 03/12/2021  4:03 PM  Visual Inspection See comments: Yes Sensation Testing Intact to touch and monofilament testing bilaterally: Yes Pulse Check Posterior Tibialis and Dorsalis pulse intact bilaterally: Yes Comments Right MTP joint swelling that has been affected by gout.       BP (!) 149/76   Pulse 67   Ht 5' 7" (1.702 m)   Wt 171 lb 9.6 oz (77.8 kg)   SpO2 98%   BMI 26.88 kg/m  Wt Readings from Last 3 Encounters:  03/12/21 171 lb 9.6 oz (77.8 kg)  03/09/21 172 lb (78 kg)  02/24/21 172 lb (78 kg)     Health Maintenance Due  Topic Date Due  . COVID-19 Vaccine (1)  Never done  . OPHTHALMOLOGY EXAM  11/09/2020  . HEMOGLOBIN A1C  03/06/2021    There are no preventive care reminders to display for this patient.  Lab Results  Component Value Date   TSH 0.986 07/19/2013   Lab Results  Component Value Date   WBC 10.1 06/16/2017   HGB 14.4 06/16/2017   HCT 40.9 06/16/2017   MCV 80.8 06/16/2017   PLT 287 06/16/2017   Lab Results  Component Value Date   NA 143 09/06/2020   K 3.7 09/06/2020   CO2 23 09/06/2020   GLUCOSE 140 (H) 09/06/2020   BUN 16 09/06/2020   CREATININE 1.43 (H) 09/06/2020   BILITOT 0.4 09/06/2020   ALKPHOS 65 09/06/2020   AST 22 09/06/2020   ALT 30 09/06/2020   PROT 7.3 09/06/2020   ALBUMIN 4.6 09/06/2020   CALCIUM 9.8 09/06/2020   ANIONGAP 14 06/16/2017   Lab Results  Component Value Date   CHOL 147 02/07/2020   Lab Results  Component Value Date   HDL 28 (L) 02/07/2020   Lab Results  Component Value Date   LDLCALC 92 02/07/2020   Lab Results  Component Value Date   TRIG 150 (H) 02/07/2020   Lab Results  Component Value Date   CHOLHDL 5.3 (H) 02/07/2020   Lab Results  Component Value Date   HGBA1C 6.5 09/06/2020      Assessment & Plan:   Problem List Items Addressed This Visit      Digestive   GERD (gastroesophageal reflux disease) Controlled. Patient reported no symptoms of GERD. Continue Protonix.    Relevant Medications   pantoprazole (PROTONIX) 40 MG tablet    Other Visit Diagnoses    Hypertension associated with diabetes (HCC)     Systolic value moderately above goal of 130/80. Patient has been in pain and also smoked prior   to the visit. Will follow at next visit and assess need for changes in medications.  Changed patient from lisinopril (20 mg)-HCTZ to 40 mg of lisinopril. Will have patient follow up with clinical pharmacist in a week to ensure he is not hypotensive. Counseled to reach out to [place] if he becomes lightheaded or dizzy.  Counseled on blood pressure goal of less than 130/80,  low-sodium, DASH diet, medication compliance, 150 minutes of moderate intensity exercise per week. Discussed medication compliance, adverse effects.   Relevant Medications   lisinopril (ZESTRIL) 40 MG tablet   amLODipine (NORVASC) 10 MG tablet   atorvastatin (LIPITOR) 40 MG tablet   metFORMIN (GLUCOPHAGE-XR) 500 MG 24 hr tablet   Other Relevant Orders   [id number]+CWCB   Basic Metabolic Panel   Hyperlipidemia associated with type 2 diabetes mellitus ([place])     Controlled. Will check LFT's today due to statin use.  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 Advised patient to stop taking indomethacin to avoid renal damage. Will begin allopurinol next Monday as a preventative. Discontinued lisinopril-HCTZ to eliminate this as a cause for his acute gout flares. Will continue with 40 mg of lisinopril instead. Counseled patient to avoid red meats and fish as this can cause acute gout flares. Will obtain a CMP in 1 week to check electrolytes and kidney function following addition of Allopurinol.   Relevant Medications   allopurinol (ZYLOPRIM) 100 MG tablet   Type 2 diabetes mellitus with other specified complication, without long-term current use of insulin (HCC)     Previously stable, pending A1c results from lab. Will address medication changes if A1c is elevated at next follow up visit.  Continue current regimen Counseled on Diabetic diet, my plate method, 762 minutes of moderate intensity exercise/week Blood sugar logs with fasting goals of 80-120 mg/dl, random of less than 180 and in the event of sugars less than 60 mg/dl or greater than 400 mg/dl encouraged to notify the clinic. Advised on the need for annual eye exams, annual foot exams, Pneumonia vaccine   Relevant Medications   lisinopril  (ZESTRIL) 40 MG tablet   atorvastatin (LIPITOR) 40 MG tablet   metFORMIN (GLUCOPHAGE-XR) 500 MG 24 hr tablet      Meds ordered this encounter  Medications  . lisinopril (ZESTRIL) 40 MG tablet    Sig: Take 1 tablet (40 mg total) by mouth daily.    Dispense:  90 tablet    Refill:  1    Discontinue Lisinopril/HCTZ  . allopurinol (ZYLOPRIM) 100 MG tablet    Sig: Take 1 tablet (100 mg total) by mouth daily.    Dispense:  30 tablet    Refill:  6    Start on 03/19/21  . amLODipine (NORVASC) 10 MG tablet    Sig: Take 1 tablet (10 mg total) by mouth daily.    Dispense:  90 tablet    Refill:  1  . atorvastatin (LIPITOR) 40 MG tablet    Sig: Take 1 tablet (40 mg total) by mouth daily.    Dispense:  90 tablet    Refill:  1  . metFORMIN (GLUCOPHAGE-XR) 500 MG 24 hr tablet    Sig: TAKE 1 TABLET(500 MG) BY MOUTH DAILY WITH BREAKFAST    Dispense:  90 tablet  Refill:  1  . pantoprazole (PROTONIX) 40 MG tablet    Sig: Take 1 tablet (40 mg total) by mouth daily.    Dispense:  90 tablet    Refill:  1    Follow-up: Return in about 1 week (around 03/19/2021) for [facility] for blood pressure evaluation; 6 months with PCP.    Vance Peper, Medical Student   Evaluation and management procedures were performed by me with Medical Student in attendance, note written by Medical Student under my supervision and collaboration. I have reviewed the note and I agree with the management and plan.  Mr. Kluger' Ggout flares could be triggered by his diuretic hence I have discontinued lisinopril/HCTZ and substituted with lisinopril.  I would recheck his renal function and potassium in 1 week.  He will need to follow-up with the clinical pharmacist in 1 week to ensure he is not hypotensive given antihypertensive regimen change.  Advised to commence allopurinol for prophylaxis after labs next week Monday and he has been advised to discontinue indomethacin.  Charlott Rakes, MD, FAAFP. [facility], [place]   03/12/2021, 4:58 PM

## 2021-03-12 NOTE — Progress Notes (Signed)
Has been having Gout in right foot.

## 2021-03-13 ENCOUNTER — Telehealth: Payer: Self-pay

## 2021-03-13 LAB — CMP14+EGFR
ALT: 26 IU/L (ref 0–44)
AST: 22 IU/L (ref 0–40)
Albumin/Globulin Ratio: 1.7 (ref 1.2–2.2)
Albumin: 4.6 g/dL (ref 3.8–4.9)
Alkaline Phosphatase: 63 IU/L (ref 44–121)
BUN/Creatinine Ratio: 11 (ref 10–24)
BUN: 14 mg/dL (ref 8–27)
Bilirubin Total: 0.4 mg/dL (ref 0.0–1.2)
CO2: 21 mmol/L (ref 20–29)
Calcium: 9.7 mg/dL (ref 8.6–10.2)
Chloride: 103 mmol/L (ref 96–106)
Creatinine, Ser: 1.31 mg/dL — ABNORMAL HIGH (ref 0.76–1.27)
Globulin, Total: 2.7 g/dL (ref 1.5–4.5)
Glucose: 87 mg/dL (ref 65–99)
Potassium: 4.1 mmol/L (ref 3.5–5.2)
Sodium: 143 mmol/L (ref 134–144)
Total Protein: 7.3 g/dL (ref 6.0–8.5)
eGFR: 62 mL/min/{1.73_m2} (ref >59)

## 2021-03-13 NOTE — Telephone Encounter (Signed)
-----   Message from Charlott Rakes, MD sent at 03/13/2021 10:19 AM EDT ----- Please inform the patient that labs are normal. Thank you.

## 2021-03-13 NOTE — Telephone Encounter (Signed)
Patient name and DOB has been verified Patient was informed of lab results. Patient had no questions.  

## 2021-03-20 ENCOUNTER — Other Ambulatory Visit: Payer: Self-pay

## 2021-03-20 ENCOUNTER — Ambulatory Visit: Payer: Medicare Other | Attending: Family Medicine | Admitting: Pharmacist

## 2021-03-20 VITALS — BP 151/76 | HR 60

## 2021-03-20 DIAGNOSIS — E1159 Type 2 diabetes mellitus with other circulatory complications: Secondary | ICD-10-CM | POA: Diagnosis not present

## 2021-03-20 DIAGNOSIS — I152 Hypertension secondary to endocrine disorders: Secondary | ICD-10-CM

## 2021-03-20 NOTE — Progress Notes (Signed)
   S:    Patient arrives well and in good spirits. Presents to the clinic for hypertension evaluation, counseling, and management. Patient was referred and last seen by Dr. Margarita Rana on 03/12/21 for gout flare. He had two recent ED visits for significant gout pain. At visit with Dr. Margarita Rana, BP was 149/76 - HCTZ was stopped due to increased gout flares. Lisinopril was increased to 40 mg, amlodipine was continued.  Today, patient reports great adherence. Brings his medication in with him today. Denies side effects or issues. Gout flare resolved and feeling much better. Does not take BP at home. Denies dizziness, headache, chest pains.   Current BP Medications include:  Amlodipine 10 mg, lisinopril 40 mg   Antihypertensives tried in the past include: hctz (d/c due to gout)   O:  Vitals:   03/20/21 1350  BP: (!) 151/76  Pulse: 60   Home BP readings: does not take   Last 3 Office BP readings: BP Readings from Last 3 Encounters:  03/20/21 (!) 151/76  03/12/21 (!) 149/76  03/09/21 (!) 145/89    BMET    Component Value Date/Time   NA 143 03/12/2021 1604   K 4.1 03/12/2021 1604   CL 103 03/12/2021 1604   CO2 21 03/12/2021 1604   GLUCOSE 87 03/12/2021 1604   GLUCOSE 78 06/16/2017 2020   BUN 14 03/12/2021 1604   CREATININE 1.31 (H) 03/12/2021 1604   CREATININE 1.53 (H) 12/04/2016 0916   CALCIUM 9.7 03/12/2021 1604   GFRNONAA 53 (L) 09/06/2020 1638   GFRNONAA 50 (L) 12/04/2016 0916   GFRAA 61 09/06/2020 1638   GFRAA 58 (L) 12/04/2016 0916    Renal function: Estimated Creatinine Clearance: 56.1 mL/min (A) (by C-G formula based on SCr of 1.31 mg/dL (H)).  Clinical ASCVD: No  The 10-year ASCVD risk score Mikey Bussing DC Jr., et al., 2013) is: 50.8%   Values used to calculate the score:     Age: 60 years     Sex: Male     Is Non-Hispanic African American: Yes     Diabetic: Yes     Tobacco smoker: Yes     Systolic Blood Pressure: 096 mmHg     Is BP treated: Yes     HDL Cholesterol: 28  mg/dL     Total Cholesterol: 147 mg/dL    A/P: Hypertension longstanding currently above goal on current medications. BP Goal = < 130/80 mmHg. Medication adherence reported. SBP elevated today, however, it has only been 1 week since medication changes. DBP and HR at goal. Will recheck in 1 month and make medication adjustments as needed.  -Continued amlodipine and lisinopril.  -F/u labs - obtain BMP at next visit  -Counseled on lifestyle modifications for blood pressure control including reduced dietary sodium, increased exercise, adequate sleep.  Results reviewed and written information provided.   Total time in face-to-face counseling 15 minutes.   F/U Clinic Visit with Pharmacy in 1 month.  Patient seen with Keyana Guevara.   Harriet Pho, PharmD PGY-1 [facility] Resident   03/20/2021 5:04 PM

## 2021-04-20 ENCOUNTER — Encounter: Payer: Self-pay | Admitting: Pharmacist

## 2021-04-20 ENCOUNTER — Ambulatory Visit: Payer: Medicare Other | Attending: Family Medicine | Admitting: Pharmacist

## 2021-04-20 ENCOUNTER — Other Ambulatory Visit: Payer: Self-pay

## 2021-04-20 VITALS — BP 149/74 | HR 76

## 2021-04-20 DIAGNOSIS — E1159 Type 2 diabetes mellitus with other circulatory complications: Secondary | ICD-10-CM | POA: Diagnosis not present

## 2021-04-20 DIAGNOSIS — I152 Hypertension secondary to endocrine disorders: Secondary | ICD-10-CM | POA: Diagnosis not present

## 2021-04-20 MED ORDER — CARVEDILOL 3.125 MG PO TABS: 3.1250 mg | ORAL_TABLET | Freq: Two times a day (BID) | ORAL | 3 refills | Status: DC

## 2021-04-20 NOTE — Progress Notes (Signed)
   S:    PCP: Dr. Margarita Rana   Patient arrives in good spirits.  Presents to the clinic for hypertension evaluation, counseling, and management. Patient was referred and last seen by Primary Care Provider on 03/12/2021. I saw him on 03/20/2021 and made no changes.    Medication adherence reported. Did take this morning. Denies any gout flares since d/c HCTZ. Denies chest pains, dyspnea, HA or dizziness.   Current BP Medications include:  amlodipine 10 mg daily, lisinopril 40 mg daily   Dietary habits include: not fully compliant with salt restriction. Does admit to adding salt to food. Trying to drink more water.  Exercise habits include: walks ~0.5 mile daily  Family / Social history:  -Fhx: none -Tobacco: 0.5 PPD every day smoker; did smoke two cigarettes already today  -Alcohol: none   O:  Vitals:   04/20/21 1048  BP: (!) 149/74  Pulse: 76   Home BP readings: none  Last 3 Office BP readings: BP Readings from Last 3 Encounters:  04/20/21 (!) 149/74  03/20/21 (!) 151/76  03/12/21 (!) 149/76   BMET    Component Value Date/Time   NA 143 03/12/2021 1604   K 4.1 03/12/2021 1604   CL 103 03/12/2021 1604   CO2 21 03/12/2021 1604   GLUCOSE 87 03/12/2021 1604   GLUCOSE 78 06/16/2017 2020   BUN 14 03/12/2021 1604   CREATININE 1.31 (H) 03/12/2021 1604   CREATININE 1.53 (H) 12/04/2016 0916   CALCIUM 9.7 03/12/2021 1604   GFRNONAA 53 (L) 09/06/2020 1638   GFRNONAA 50 (L) 12/04/2016 0916   GFRAA 61 09/06/2020 1638   GFRAA 58 (L) 12/04/2016 0916    Renal function: CrCl cannot be calculated (Patient's most recent lab result is older than the maximum 21 days allowed.).  Clinical ASCVD: No  The 10-year ASCVD risk score Mikey Bussing DC Jr., et al., 2013) is: 49.9%   Values used to calculate the score:     Age: 1 years     Sex: Male     Is Non-Hispanic African American: Yes     Diabetic: Yes     Tobacco smoker: Yes     Systolic Blood Pressure: 109 mmHg     Is BP treated: Yes     HDL  Cholesterol: 28 mg/dL     Total Cholesterol: 147 mg/dL   A/P: Hypertension longstanding currently above goal on current medications. BP Goal = < 130/80 mmHg. Medication adherence reported.  -Started carvedilol 3.125 mg BID. -Continue lisinopril, amlodipine at current doses.   -F/u labs ordered - BMP -Counseled on lifestyle modifications for blood pressure control including reduced dietary sodium, increased exercise, adequate sleep.  Results reviewed and written information provided. Total time in face-to-face counseling 15 minutes.   F/U Clinic Visit in 1 month.   Benard Halsted, PharmD, [place], [place] [phone]

## 2021-04-21 LAB — BASIC METABOLIC PANEL
BUN/Creatinine Ratio: 6 — ABNORMAL LOW (ref 10–24)
BUN: 7 mg/dL — ABNORMAL LOW (ref 8–27)
CO2: 21 mmol/L (ref 20–29)
Calcium: 9.9 mg/dL (ref 8.6–10.2)
Chloride: 105 mmol/L (ref 96–106)
Creatinine, Ser: 1.21 mg/dL (ref 0.76–1.27)
Glucose: 113 mg/dL — ABNORMAL HIGH (ref 65–99)
Potassium: 4.2 mmol/L (ref 3.5–5.2)
Sodium: 143 mmol/L (ref 134–144)
eGFR: 69 mL/min/{1.73_m2} (ref >59)

## 2021-04-23 ENCOUNTER — Encounter: Payer: Self-pay | Admitting: *Deleted

## 2021-05-18 ENCOUNTER — Encounter: Payer: Self-pay | Admitting: Pharmacist

## 2021-05-18 ENCOUNTER — Ambulatory Visit: Payer: Medicare Other | Attending: Family Medicine | Admitting: Pharmacist

## 2021-05-18 ENCOUNTER — Other Ambulatory Visit: Payer: Self-pay

## 2021-05-18 VITALS — BP 130/68 | HR 68

## 2021-05-18 DIAGNOSIS — E1159 Type 2 diabetes mellitus with other circulatory complications: Secondary | ICD-10-CM

## 2021-05-18 DIAGNOSIS — I152 Hypertension secondary to endocrine disorders: Secondary | ICD-10-CM

## 2021-05-18 NOTE — Progress Notes (Signed)
   S:    PCP: Dr. Margarita Rana   Patient arrives in good spirits.  Presents to the clinic for hypertension evaluation, counseling, and management. Patient was referred and last seen by Primary Care Provider on 03/12/2021. I saw him on 04/20/2021 and started carvedilol.   Medication adherence reported. Did take this morning. Denies any side effects to the carvedilol. Denies chest pains/dyspnea/HA/blurred vision.  Current BP Medications include:  amlodipine 10 mg daily, carvedilol 3.125 mg BID, lisinopril 40 mg daily   Dietary habits include: not fully compliant with salt restriction. Does admit to adding salt to food. Trying to drink more water.  Exercise habits include: walks ~0.5 mile daily  Family / Social history:  -Fhx: none -Tobacco: 0.5 PPD every day smoker  -Alcohol: none   O:  Vitals:   05/18/21 1043  BP: 130/68  Pulse: 68   Home BP readings: none  Last 3 Office BP readings: BP Readings from Last 3 Encounters:  05/18/21 130/68  04/20/21 (!) 149/74  03/20/21 (!) 151/76   BMET    Component Value Date/Time   NA 143 04/20/2021 1058   K 4.2 04/20/2021 1058   CL 105 04/20/2021 1058   CO2 21 04/20/2021 1058   GLUCOSE 113 (H) 04/20/2021 1058   GLUCOSE 78 06/16/2017 2020   BUN 7 (L) 04/20/2021 1058   CREATININE 1.21 04/20/2021 1058   CREATININE 1.53 (H) 12/04/2016 0916   CALCIUM 9.9 04/20/2021 1058   GFRNONAA 53 (L) 09/06/2020 1638   GFRNONAA 50 (L) 12/04/2016 0916   GFRAA 61 09/06/2020 1638   GFRAA 58 (L) 12/04/2016 0916    Renal function: CrCl cannot be calculated (Patient's most recent lab result is older than the maximum 21 days allowed.).  Clinical ASCVD: No  The 10-year ASCVD risk score Mikey Bussing DC Jr., et al., 2013) is: 41.3%   Values used to calculate the score:     Age: 59 years     Sex: Male     Is Non-Hispanic African American: Yes     Diabetic: Yes     Tobacco smoker: Yes     Systolic Blood Pressure: [id number]     Is BP treated: Yes     HDL Cholesterol:  28 mg/dL     Total Cholesterol: 147 mg/dL   A/P: Hypertension longstanding currently at goal on current medications. BP Goal = < 130/80 mmHg. Medication adherence reported.  -Continue carvedilol 3.125 mg BID. -Continue lisinopril, amlodipine at current doses.   -F/u labs ordered - none -Counseled on lifestyle modifications for blood pressure control including reduced dietary sodium, increased exercise, adequate sleep.  Results reviewed and written information provided. Total time in face-to-face counseling 15 minutes.   F/U Clinic Visit in 1.5 months   Benard Halsted, PharmD, [place], [place] [phone]

## 2021-05-21 ENCOUNTER — Ambulatory Visit: Payer: Medicare Other | Admitting: Family Medicine

## 2021-06-18 ENCOUNTER — Other Ambulatory Visit: Payer: Self-pay

## 2021-06-18 ENCOUNTER — Other Ambulatory Visit (HOSPITAL_COMMUNITY)
Admission: RE | Admit: 2021-06-18 | Discharge: 2021-06-18 | Disposition: A | Discharge status: Home / Self Care | Payer: Medicare Other | Source: Ambulatory Visit | Attending: Physician Assistant | Admitting: Physician Assistant

## 2021-06-18 ENCOUNTER — Ambulatory Visit: Payer: Medicare Other | Admitting: Physician Assistant

## 2021-06-18 VITALS — BP 138/82 | HR 95 | Temp 98.7°F | Resp 18 | Ht 67.0 in | Wt 167.0 lb

## 2021-06-18 DIAGNOSIS — Z113 Encounter for screening for infections with a predominantly sexual mode of transmission: Secondary | ICD-10-CM | POA: Diagnosis present

## 2021-06-18 DIAGNOSIS — B379 Candidiasis, unspecified: Secondary | ICD-10-CM

## 2021-06-18 DIAGNOSIS — J0110 Acute frontal sinusitis, unspecified: Secondary | ICD-10-CM

## 2021-06-18 DIAGNOSIS — N3001 Acute cystitis with hematuria: Secondary | ICD-10-CM

## 2021-06-18 DIAGNOSIS — R300 Dysuria: Secondary | ICD-10-CM | POA: Diagnosis not present

## 2021-06-18 LAB — POCT URINALYSIS DIP (CLINITEK)
Bilirubin, UA: NEGATIVE
Glucose, UA: NEGATIVE mg/dL
Ketones, POC UA: NEGATIVE mg/dL
Nitrite, UA: NEGATIVE
POC PROTEIN,UA: 100 — AB
Spec Grav, UA: 1.02 (ref 1.010–1.025)
Urobilinogen, UA: 4 E.U./dL — AB
pH, UA: 7.5 (ref 5.0–8.0)

## 2021-06-18 MED ORDER — FLUCONAZOLE 150 MG PO TABS: 150.0000 mg | ORAL_TABLET | Freq: Once | ORAL | 0 refills | Status: AC

## 2021-06-18 MED ORDER — FLUTICASONE PROPIONATE 50 MCG/ACT NA SUSP: 2.0000 | Freq: Every day | NASAL | 6 refills | Status: DC

## 2021-06-18 MED ORDER — CETIRIZINE HCL 10 MG PO TABS
10.0000 mg | ORAL_TABLET | Freq: Every day | ORAL | 11 refills | Status: DC
Start: 2021-06-18 — End: 2021-09-12

## 2021-06-18 NOTE — Progress Notes (Signed)
Patient has not eaten today and patient has taken medication today. Patient reports pain in the penis on the head when urinating. Patient reports creamy white discharge noticed since Friday. Patients last intercourse 3 years ago. Patient reports night sweat with ear pain. Patient reports congestion and denies any exposure.

## 2021-06-18 NOTE — Progress Notes (Signed)
Established Patient Office Visit  Subjective:  Patient ID: Henry Walker, male    DOB: April 09, 1961  Age: 60 y.o. MRN: [id number]  CC:  Chief Complaint  Patient presents with   Urinary Tract Infection    HPI [facility] reports that he has been having dysuria and a white creamy penile discharge for the past 4 days.  Denies any sexual intercourse in the past 3 years.  Denies suprapubic pain or lower back pain.  Denies fever, nausea or vomiting.  Reports that he did decrease his intake of water after this started due to the discomfort.  Does endorse that he has also been having night sweats over the last few nights, bilateral ear pain, a feeling of congestion, no nasal discharge no cough.  Reports that he has been taking Sudafed with some relief.  Does endorse significant history of bilateral cerumen impaction.   Past Medical History:  Diagnosis Date   Diabetes mellitus without complication (HCC)    GERD (gastroesophageal reflux disease)    Gout    Hx of adenomatous colonic polyps 10/06/2014   Hyperlipidemia    Hypertension     Past Surgical History:  Procedure Laterality Date   BACK SURGERY     COLONOSCOPY      Family History  Problem Relation Age of Onset   Colon cancer Neg Hx    Esophageal cancer Neg Hx    Rectal cancer Neg Hx    Stomach cancer Neg Hx    Colon polyps Neg Hx     Social History   Socioeconomic History   Marital status: Divorced    Spouse name: Not on file   Number of children: Not on file   Years of education: Not on file   Highest education level: Not on file  Occupational History   Not on file  Tobacco Use   Smoking status: Every Day    Packs/day: 0.50    Types: Cigarettes   Smokeless tobacco: Never  Vaping Use   Vaping Use: Never used  Substance and Sexual Activity   Alcohol use: No    Alcohol/week: 0.0 standard drinks   Drug use: No   Sexual activity: Not Currently  Other Topics Concern   Not on file  Social  History Narrative   Retired Location manager (gas, IT trainer, cable)   Social Determinants of Radio broadcast assistant Strain: Not on file  Food Insecurity: Not on file  Transportation Needs: Not on file  Physical Activity: Not on file  Stress: Not on file  Social Connections: Not on file  Intimate Partner Violence: Not on file    Outpatient Medications Prior to Visit  Medication Sig Dispense Refill   allopurinol (ZYLOPRIM) 100 MG tablet Take 1 tablet (100 mg total) by mouth daily. 30 tablet 6   amLODipine (NORVASC) 10 MG tablet Take 1 tablet (10 mg total) by mouth daily. 90 tablet 1   atorvastatin (LIPITOR) 40 MG tablet Take 1 tablet (40 mg total) by mouth daily. 90 tablet 1   Blood Glucose Monitoring Suppl (ONE TOUCH ULTRA 2) w/Device KIT Use as directed 3 times daily before meals. 1 kit 0   carvedilol (COREG) 3.125 MG tablet Take 1 tablet (3.125 mg total) by mouth 2 (two) times daily with a meal. 60 tablet 3   glucose blood (ONETOUCH ULTRA) test strip Use as instructed 3 times daily 100 each 12   Lancet Devices (ACCU-CHEK SOFTCLIX) lancets Use as instructed 1 each 0  Lancets (ONETOUCH ULTRASOFT) lancets Use as instructed 3 times daily 100 each 12   lisinopril (ZESTRIL) 40 MG tablet Take 1 tablet (40 mg total) by mouth daily. 90 tablet 1   metFORMIN (GLUCOPHAGE-XR) 500 MG 24 hr tablet TAKE 1 TABLET(500 MG) BY MOUTH DAILY WITH BREAKFAST 90 tablet 1   pantoprazole (PROTONIX) 40 MG tablet Take 1 tablet (40 mg total) by mouth daily. 90 tablet 1   aspirin EC 81 MG tablet Take 1 tablet (81 mg total) by mouth daily. (Patient not taking: No sig reported) 30 tablet 11   clotrimazole (LOTRIMIN) 1 % cream Apply 1 application topically 2 (two) times daily. (Patient not taking: No sig reported) 30 g 0   ofloxacin (FLOXIN) 0.3 % OTIC solution Place 10 drops daily into the left ear. For 7 days. (Patient not taking: Reported on 06/18/2021) 5 mL 0   omega-3 acid ethyl esters (LOVAZA) 1 g capsule  TAKE 1 CAPSULE BY MOUTH TWICE DAILY (Patient not taking: No sig reported) 60 capsule 2   sildenafil (VIAGRA) 50 MG tablet Take 1 tablet (50 mg total) by mouth daily as needed for erectile dysfunction. At least 24 hours between doses (Patient not taking: No sig reported) 10 tablet 3   colchicine 0.6 MG tablet Take 2 tabs (1.2 mg) at the onset of a gout attack, may repeat 1 tablet (0.6 mg) after 1 hour if symptoms persist 30 tablet 0   Facility-Administered Medications Prior to Visit  Medication Dose Route Frequency Provider Last Rate Last Admin   0.9 %  sodium chloride infusion  500 mL Intravenous Once Gatha Mayer, MD       carbamide peroxide (DEBROX) 6.5 % otic solution 5 drop  5 drop Left EAR Once Langeland, Dawn T, MD        No Known Allergies  ROS Review of Systems  Constitutional:  Negative for chills and fever.  HENT:  Positive for congestion, ear pain, postnasal drip and sinus pressure. Negative for facial swelling, sinus pain, sneezing, sore throat and trouble swallowing.   Eyes: Negative.   Respiratory:  Negative for cough and shortness of breath.   Cardiovascular:  Negative for chest pain.  Gastrointestinal:  Negative for abdominal pain, diarrhea, nausea and vomiting.  Endocrine: Negative.   Genitourinary:  Positive for dysuria and penile discharge. Negative for difficulty urinating, frequency, hematuria, penile pain, penile swelling, scrotal swelling and testicular pain.  Musculoskeletal:  Negative for back pain.  Skin: Negative.   Allergic/Immunologic: Negative.   Neurological: Negative.   Hematological: Negative.   Psychiatric/Behavioral: Negative.       Objective:    Physical Exam Vitals and nursing note reviewed.  Constitutional:      Appearance: Normal appearance.  HENT:     Head: Normocephalic and atraumatic.     Right Ear: External ear normal.     Left Ear: External ear normal.     Nose: Nose normal.     Mouth/Throat:     Mouth: Mucous membranes are  moist.     Pharynx: Oropharynx is clear.  Eyes:     Extraocular Movements: Extraocular movements intact.     Conjunctiva/sclera: Conjunctivae normal.     Pupils: Pupils are equal, round, and reactive to light.  Cardiovascular:     Rate and Rhythm: Normal rate and regular rhythm.     Pulses: Normal pulses.     Heart sounds: Normal heart sounds.  Pulmonary:     Effort: Pulmonary effort is normal.  Breath sounds: Normal breath sounds.  Musculoskeletal:        General: Normal range of motion.     Cervical back: Normal range of motion and neck supple.  Skin:    General: Skin is warm and dry.  Neurological:     General: No focal deficit present.     Mental Status: He is alert and oriented to person, place, and time.  Psychiatric:        Mood and Affect: Mood normal.        Behavior: Behavior normal.        Thought Content: Thought content normal.        Judgment: Judgment normal.    BP 138/82 (BP Location: Left Arm, Patient Position: Sitting, Cuff Size: Normal)   Pulse 95   Temp 98.7 F (37.1 C) (Oral)   Resp 18   Ht _0  (1.702 m)   Wt 167 lb (75.8 kg)   SpO2 96%   BMI 26.16 kg/m  Wt Readings from Last 3 Encounters:  06/18/21 167 lb (75.8 kg)  03/12/21 171 lb 9.6 oz (77.8 kg)  03/09/21 172 lb (78 kg)     Health Maintenance Due  Topic Date Due   COVID-19 Vaccine (1) Never done   Zoster Vaccines- Shingrix (1 of 2) Never done   Pneumococcal Vaccine 11-64 Years old (2 - PCV) 05/28/2016   OPHTHALMOLOGY EXAM  11/09/2020   HEMOGLOBIN A1C  03/06/2021   INFLUENZA VACCINE  05/28/2021    There are no preventive care reminders to display for this patient.  Lab Results  Component Value Date   TSH 0.986 07/19/2013   Lab Results  Component Value Date   WBC 10.1 06/16/2017   HGB 14.4 06/16/2017   HCT 40.9 06/16/2017   MCV 80.8 06/16/2017   PLT 287 06/16/2017   Lab Results  Component Value Date   NA 143 04/20/2021   K 4.2 04/20/2021   CO2 21 04/20/2021   GLUCOSE  113 (H) 04/20/2021   BUN 7 (L) 04/20/2021   CREATININE 1.21 04/20/2021   BILITOT 0.4 03/12/2021   ALKPHOS 63 03/12/2021   AST 22 03/12/2021   ALT 26 03/12/2021   PROT 7.3 03/12/2021   ALBUMIN 4.6 03/12/2021   CALCIUM 9.9 04/20/2021   ANIONGAP 14 06/16/2017   EGFR 69 04/20/2021   Lab Results  Component Value Date   CHOL 147 02/07/2020   Lab Results  Component Value Date   HDL 28 (L) 02/07/2020   Lab Results  Component Value Date   LDLCALC 92 02/07/2020   Lab Results  Component Value Date   TRIG 150 (H) 02/07/2020   Lab Results  Component Value Date   CHOLHDL 5.3 (H) 02/07/2020   Lab Results  Component Value Date   HGBA1C 6.5 09/06/2020      Assessment & Plan:   Problem List Items Addressed This Visit   None Visit Diagnoses     Screening for STD (sexually transmitted disease)    -  Primary   Relevant Orders   Urine cytology ancillary only   Yeast infection       Relevant Orders   POCT URINALYSIS DIP (CLINITEK) (Completed)   Urine Culture   Acute non-recurrent frontal sinusitis       Relevant Medications   cetirizine (ZYRTEC) 10 MG tablet   fluticasone (FLONASE) 50 MCG/ACT nasal spray       Meds ordered this encounter  Medications   fluconazole (DIFLUCAN) 150 MG tablet  Sig: Take 1 tablet (150 mg total) by mouth once for 1 dose.    Dispense:  1 tablet    Refill:  0    Order Specific Question:   Supervising Provider    Answer:   Asencion Noble E [[id number]]   cetirizine (ZYRTEC) 10 MG tablet    Sig: Take 1 tablet (10 mg total) by mouth daily.    Dispense:  30 tablet    Refill:  11    Order Specific Question:   Supervising Provider    Answer:   Asencion Noble E [[id number]]   fluticasone (FLONASE) 50 MCG/ACT nasal spray    Sig: Place 2 sprays into both nostrils daily.    Dispense:  16 g    Refill:  6    Order Specific Question:   Supervising Provider    Answer:   Joya Gaskins, PATRICK E [[id number]]  1. Yeast infection Trial Diflucan.  Patient encouraged to  increase hydration, red flags given for prompt reevaluation. - POCT URINALYSIS DIP (CLINITEK) - Urine Culture - fluconazole (DIFLUCAN) 150 MG tablet; Take 1 tablet (150 mg total) by mouth once for 1 dose.  Dispense: 1 tablet; Refill: 0  2. Acute non-recurrent frontal sinusitis Trial Zyrtec, Flonase.  Patient education given on proper use of over-the-counter cold medicines with diagnosis of hypertension. - cetirizine (ZYRTEC) 10 MG tablet; Take 1 tablet (10 mg total) by mouth daily.  Dispense: 30 tablet; Refill: 11 - fluticasone (FLONASE) 50 MCG/ACT nasal spray; Place 2 sprays into both nostrils daily.  Dispense: 16 g; Refill: 6  3. Screening for STD (sexually transmitted disease)  - Urine cytology ancillary only   I have reviewed the patient's medical history (PMH, PSH, Social History, Family History, Medications, and allergies) , and have been updated if relevant. I spent 30 minutes reviewing chart and  face to face time with patient.   Follow-up: Return if symptoms worsen or fail to improve.    Loraine Grip Mayers, PA-C

## 2021-06-18 NOTE — Patient Instructions (Signed)
I encourage you to start taking Zyrtec on a daily basis, you will also use Flonase as directed until your symptoms resolve.  To help with your penile discharge, and burning with urination, you will take Diflucan once, I encourage you to increase your water intake.  We will call you with today's lab results.  Kennieth Rad, PA-C Physician Assistant [facility] [email or web address]  Allergic Rhinitis, Adult  Allergic rhinitis is an allergic reaction that affects the mucous membraneinside the nose. The mucous membrane is the tissue that produces mucus. There are two types of allergic rhinitis: Seasonal. This type is also called hay fever and happens only during certain seasons. Perennial. This type can happen at any time of the year. Allergic rhinitis cannot be spread from person to person. This condition can bemild, moderate, or severe. It can develop at any age and may be outgrown. What are the causes? This condition is caused by allergens. These are things that can cause an allergic reaction. Allergens may differ for seasonal allergic rhinitis and perennial allergic rhinitis. Seasonal allergic rhinitis is triggered by pollen. Pollen can come from grasses, trees, and weeds. Perennial allergic rhinitis may be triggered by: Dust mites. Proteins in a pet's urine, saliva, or dander. Dander is dead skin cells from a pet. Smoke, mold, or car fumes. What increases the risk? You are more likely to develop this condition if you have a family history of allergies or other conditions related to allergies, including: Allergic conjunctivitis. This is inflammation of parts of the eyes and eyelids. Asthma. This condition affects the lungs and makes it hard to breathe. Atopic dermatitis or eczema. This is long term (chronic) inflammation of the skin. Food allergies. What are the signs or symptoms? Symptoms of this condition include: Sneezing or  coughing. A stuffy nose (nasal congestion), itchy nose, or nasal discharge. Itchy eyes and tearing of the eyes. A feeling of mucus dripping down the back of your throat (postnasal drip). Trouble sleeping. Tiredness or fatigue. Headache. Sore throat. How is this diagnosed? This condition may be diagnosed with your symptoms, medical history, and physical exam. Your health care provider may check for related conditions, such as: Asthma. Pink eye. This is eye inflammation caused by infection (conjunctivitis). Ear infection. Upper respiratory infection. This is an infection in the nose, throat, or upper airways. You may also have tests to find out which allergens trigger your symptoms.These may include skin tests or blood tests. How is this treated? There is no cure for this condition, but treatment can help control symptoms. Treatment may include: Taking medicines that block allergy symptoms, such as corticosteroids and antihistamines. Medicine may be given as a shot, nasal spray, or pill. Avoiding any allergens. Being exposed again and again to tiny amounts of allergens to help you build a defense against allergens (immunotherapy). This is done if other treatments have not helped. It may include: Allergy shots. These are injected medicines that have small amounts of allergen in them. Sublingual immunotherapy. This involves taking small doses of a medicine with allergen in it under your tongue. If these treatments do not work, your health care provider may prescribe newer,stronger medicines. Follow these instructions at home: Avoiding allergens Find out what you are allergic to and avoid those allergens. These are some things you can do to help avoid allergens: If you have perennial allergies: Replace carpet with wood, tile, or vinyl flooring. Carpet can trap dander and dust. Do not smoke. Do not allow smoking in your home.  Change your heating and air conditioning filters at least once a  month. If you have seasonal allergies, take these steps during allergy season: Keep windows closed as much as possible. Plan outdoor activities when pollen counts are lowest. Check pollen counts before you plan outdoor activities. When coming indoors, change clothing and shower before sitting on furniture or bedding. If you have a pet in the house that produces allergens: Keep the pet out of the bedroom. Vacuum, sweep, and dust regularly. General instructions Take over-the-counter and prescription medicines only as told by your health care provider. Drink enough fluid to keep your urine pale yellow. Keep all follow-up visits as told by your health care provider. This is important. Where to find more information American Academy of Allergy, Asthma & Immunology: www.aaaai.org Contact a health care provider if: You have a fever. You develop a cough that does not go away. You make whistling sounds when you breathe (wheeze). Your symptoms slow you down or stop you from doing your normal activities each day. Get help right away if: You have shortness of breath. This symptom may represent a serious problem that is an emergency. Do not wait to see if the symptom will go away. Get medical help right away. Call your local emergency services (911 in the [place] Do not drive yourself to the hospital. Summary Allergic rhinitis may be managed by taking medicines as directed and avoiding allergens. If you have seasonal allergies, keep windows closed as much as possible during allergy season. Contact your health care provider if you develop a fever or a cough that does not go away. This information is not intended to replace advice given to you by your health care provider. Make sure you discuss any questions you have with your healthcare provider. Document Revised: 12/03/2019 Document Reviewed: 10/12/2019 Elsevier Patient Education  2022 [employer].

## 2021-06-19 ENCOUNTER — Emergency Department (HOSPITAL_COMMUNITY)
Admission: EM | Admit: 2021-06-19 | Discharge: 2021-06-19 | Disposition: A | Discharge status: Home / Self Care | Payer: Medicare Other | Attending: Emergency Medicine | Admitting: Emergency Medicine

## 2021-06-19 ENCOUNTER — Encounter (HOSPITAL_COMMUNITY): Payer: Self-pay | Admitting: Emergency Medicine

## 2021-06-19 ENCOUNTER — Other Ambulatory Visit: Payer: Self-pay

## 2021-06-19 DIAGNOSIS — R202 Paresthesia of skin: Secondary | ICD-10-CM | POA: Diagnosis not present

## 2021-06-19 DIAGNOSIS — Z5321 Procedure and treatment not carried out due to patient leaving prior to being seen by health care provider: Secondary | ICD-10-CM | POA: Insufficient documentation

## 2021-06-19 NOTE — ED Notes (Signed)
I called patient back to a fast track room and no one responded

## 2021-06-19 NOTE — ED Provider Notes (Signed)
Patient left without being seen.  I did not evaluate this patient.   Alfredia Client, PA-C 06/19/21 Yevette Edwards    Varney Biles, MD 06/20/21 1800

## 2021-06-19 NOTE — ED Triage Notes (Signed)
Went to a mobile clinic, got a UA and dose of Diflucan, states he took that but is still having burning with urination. Symptoms started 06/15/21. States he has not been sexually active in 3 years, no concerns for STD.

## 2021-06-20 LAB — URINE CYTOLOGY ANCILLARY ONLY
Bacterial Vaginitis-Urine: NEGATIVE
Chlamydia: NEGATIVE
Comment: NEGATIVE
Comment: NEGATIVE
Comment: NORMAL
Neisseria Gonorrhea: NEGATIVE
Trichomonas: NEGATIVE

## 2021-06-21 LAB — URINE CULTURE

## 2021-06-21 MED ORDER — SULFAMETHOXAZOLE-TRIMETHOPRIM 800-160 MG PO TABS: 1.0000 | ORAL_TABLET | Freq: Two times a day (BID) | ORAL | 0 refills | Status: AC

## 2021-06-21 NOTE — Addendum Note (Signed)
Addended by: Kennieth Rad on: 06/21/2021 02:53 PM   Modules accepted: Orders

## 2021-06-26 ENCOUNTER — Ambulatory Visit: Payer: Medicare Other

## 2021-06-26 ENCOUNTER — Other Ambulatory Visit: Payer: Self-pay

## 2021-06-26 NOTE — Progress Notes (Signed)
Patient presented to the unit and is aware of Rx being sent to the pharmacy for pick up and may return in 2 weeks if concern has not resolved.

## 2021-07-20 ENCOUNTER — Encounter: Payer: Self-pay | Admitting: Pharmacist

## 2021-07-20 ENCOUNTER — Ambulatory Visit: Payer: Medicare Other | Attending: Family Medicine | Admitting: Pharmacist

## 2021-07-20 ENCOUNTER — Other Ambulatory Visit: Payer: Self-pay

## 2021-07-20 VITALS — BP 144/75 | HR 55

## 2021-07-20 DIAGNOSIS — I152 Hypertension secondary to endocrine disorders: Secondary | ICD-10-CM | POA: Diagnosis not present

## 2021-07-20 DIAGNOSIS — E1159 Type 2 diabetes mellitus with other circulatory complications: Secondary | ICD-10-CM

## 2021-07-20 NOTE — Progress Notes (Signed)
   S:    PCP: Dr. Margarita Rana   Patient arrives in good spirits.  Presents to the clinic for hypertension evaluation, counseling, and management. Patient was referred and last seen by Primary Care Provider on 03/12/2021. I saw him on 04/20/2021 and started carvedilol.   Medication adherence reported. Did take this morning. Denies any side effects to the carvedilol. Denies chest pains/dyspnea/HA/blurred vision.  Current BP Medications include:  amlodipine 10 mg daily, carvedilol 3.125 mg BID, lisinopril 40 mg daily   Dietary habits include: not fully compliant with salt restriction. Does admit to adding salt to food. Trying to drink more water. Of note, pt ate chicken wings and fried rice last night with soy sauce.  Exercise habits include: walks ~0.5 mile daily  Family / Social history:  -Fhx: none -Tobacco: 0.5 PPD every day smoker  -Alcohol: none   O:  Vitals:   07/20/21 1141  BP: (!) 144/75  Pulse: (!) 55    Home BP readings: none  Last 3 Office BP readings: BP Readings from Last 3 Encounters:  07/20/21 (!) 144/75  06/19/21 118/78  06/18/21 138/82   BMET    Component Value Date/Time   NA 143 04/20/2021 1058   K 4.2 04/20/2021 1058   CL 105 04/20/2021 1058   CO2 21 04/20/2021 1058   GLUCOSE 113 (H) 04/20/2021 1058   GLUCOSE 78 06/16/2017 2020   BUN 7 (L) 04/20/2021 1058   CREATININE 1.21 04/20/2021 1058   CREATININE 1.53 (H) 12/04/2016 0916   CALCIUM 9.9 04/20/2021 1058   GFRNONAA 53 (L) 09/06/2020 1638   GFRNONAA 50 (L) 12/04/2016 0916   GFRAA 61 09/06/2020 1638   GFRAA 58 (L) 12/04/2016 0916    Renal function: CrCl cannot be calculated (Patient's most recent lab result is older than the maximum 21 days allowed.).  Clinical ASCVD: No  The 10-year ASCVD risk score (Arnett DK, et al., 2019) is: 49.1%   Values used to calculate the score:     Age: 60 years     Sex: Male     Is Non-Hispanic African American: Yes     Diabetic: Yes     Tobacco smoker: Yes      Systolic Blood Pressure: 937 mmHg     Is BP treated: Yes     HDL Cholesterol: 28 mg/dL     Total Cholesterol: 147 mg/dL   A/P: Hypertension longstanding currently above goal on current medications. Could be attributed to excessive sodium consumption last night. Unfortunately, he does not have any readings from home. BP Goal = < 130/80 mmHg. Medication adherence reported. No changes today.  -Continue carvedilol 3.125 mg BID. -Continue lisinopril, amlodipine at current doses.   -F/u labs ordered - none -Counseled on lifestyle modifications for blood pressure control including reduced dietary sodium, increased exercise, adequate sleep.  Results reviewed and written information provided. Total time in face-to-face counseling 15 minutes.   F/U Clinic Visit in Nov with Dr. Margarita Rana.   Benard Halsted, PharmD, Para [place], [place] [phone]

## 2021-08-28 ENCOUNTER — Other Ambulatory Visit: Payer: Self-pay | Admitting: Family Medicine

## 2021-08-28 NOTE — Telephone Encounter (Signed)
Requested medication (s) are due for refill today - no  Requested medication (s) are on the active medication list -yes  Future visit scheduled -yes  Last refill: today  Notes to clinic: patient request 90 day supply- next appointment- 09/12/21. Requires changes from original Rx  Requested Prescriptions  Pending Prescriptions Disp Refills   carvedilol (COREG) 3.125 MG tablet [Pharmacy Med Name: CARVEDILOL 3.125MG  TABLETS] 180 tablet     Sig: TAKE 1 TABLET(3.125 MG) BY MOUTH TWICE DAILY WITH A MEAL     Cardiovascular:  Beta Blockers Failed - 08/28/2021  2:44 PM      Failed - Last BP in normal range    BP Readings from Last 1 Encounters:  07/20/21 (!) 144/75          Passed - Last Heart Rate in normal range    Pulse Readings from Last 1 Encounters:  07/20/21 (!) 55          Passed - Valid encounter within last 6 months    Recent Outpatient Visits           1 month ago Hypertension associated with diabetes [facility])   [place], [place] L, RPH-CPP   3 months ago Hypertension associated with diabetes [facility])   [place], Annie Main L, RPH-CPP   4 months ago Hypertension associated with diabetes [facility])   [place], Annie Main L, RPH-CPP   5 months ago Hypertension associated with diabetes [facility])   [place], Annie Main L, RPH-CPP   5 months ago Hypertension associated with diabetes [facility])   [place] Charlott Rakes, MD       Future Appointments             In 2 weeks Charlott Rakes, MD [place]               Requested Prescriptions  Pending Prescriptions Disp Refills   carvedilol (COREG) 3.125 MG tablet [Pharmacy Med Name: CARVEDILOL 3.125MG  TABLETS] 180 tablet     Sig: TAKE 1 TABLET(3.125 MG) BY MOUTH TWICE DAILY WITH A MEAL      Cardiovascular:  Beta Blockers Failed - 08/28/2021  2:44 PM      Failed - Last BP in normal range    BP Readings from Last 1 Encounters:  07/20/21 (!) 144/75          Passed - Last Heart Rate in normal range    Pulse Readings from Last 1 Encounters:  07/20/21 (!) 55          Passed - Valid encounter within last 6 months    Recent Outpatient Visits           1 month ago Hypertension associated with diabetes [facility])   [place], [place] L, RPH-CPP   3 months ago Hypertension associated with diabetes [facility])   [place], Annie Main L, RPH-CPP   4 months ago Hypertension associated with diabetes [facility])   [place], Annie Main L, RPH-CPP   5 months ago Hypertension associated with diabetes [facility])   [place], Annie Main L, RPH-CPP   5 months ago Hypertension associated with diabetes [facility])   [facility] Charlott Rakes, MD  Future Appointments             In 2 weeks Charlott Rakes, MD [place]              c

## 2021-08-28 NOTE — Telephone Encounter (Signed)
Requested Prescriptions  Pending Prescriptions Disp Refills  . carvedilol (COREG) 3.125 MG tablet [Pharmacy Med Name: CARVEDILOL 3.125MG  TABLETS] 60 tablet 3    Sig: TAKE 1 TABLET(3.125 MG) BY MOUTH TWICE DAILY WITH A MEAL     Cardiovascular:  Beta Blockers Failed - 08/28/2021  2:26 PM      Failed - Last BP in normal range    BP Readings from Last 1 Encounters:  07/20/21 (!) 144/75         Passed - Last Heart Rate in normal range    Pulse Readings from Last 1 Encounters:  07/20/21 (!) 55         Passed - Valid encounter within last 6 months    Recent Outpatient Visits          1 month ago Hypertension associated with diabetes [facility])   [place], Vista L, RPH-CPP   3 months ago Hypertension associated with diabetes [facility])   [place], Annie Main L, RPH-CPP   4 months ago Hypertension associated with diabetes [facility])   [place], Annie Main L, RPH-CPP   5 months ago Hypertension associated with diabetes [facility])   [place], Jarome Matin, RPH-CPP   5 months ago Hypertension associated with diabetes [facility])   [place] Charlott Rakes, MD      Future Appointments            In 2 weeks Charlott Rakes, MD [place]

## 2021-09-10 ENCOUNTER — Other Ambulatory Visit: Payer: Self-pay | Admitting: Family Medicine

## 2021-09-10 DIAGNOSIS — E1169 Type 2 diabetes mellitus with other specified complication: Secondary | ICD-10-CM

## 2021-09-10 NOTE — Telephone Encounter (Signed)
Requested Prescriptions  Pending Prescriptions Disp Refills  . lisinopril (ZESTRIL) 40 MG tablet [Pharmacy Med Name: LISINOPRIL 40MG  TABLETS] 90 tablet 0    Sig: TAKE 1 TABLET(40 MG) BY MOUTH DAILY     Cardiovascular:  ACE Inhibitors Failed - 09/10/2021  2:38 PM      Failed - Last BP in normal range    BP Readings from Last 1 Encounters:  07/20/21 (!) 144/75         Passed - Cr in normal range and within 180 days    Creat  Date Value Ref Range Status  12/04/2016 1.53 (H) 0.70 - 1.33 mg/dL Final    Comment:      For patients > or = 61 years of age: The upper reference limit for Creatinine is approximately 13% higher for people identified as African-American.      Creatinine, Ser  Date Value Ref Range Status  04/20/2021 1.21 0.76 - 1.27 mg/dL Final   Creatinine, Urine  Date Value Ref Range Status  06/03/2016 145 20 - 370 mg/dL Final         Passed - K in normal range and within 180 days    Potassium  Date Value Ref Range Status  04/20/2021 4.2 3.5 - 5.2 mmol/L Final         Passed - Patient is not pregnant      Passed - Valid encounter within last 6 months    Recent Outpatient Visits          1 month ago Hypertension associated with diabetes [facility])   [place], Annie Main L, RPH-CPP   3 months ago Hypertension associated with diabetes [facility])   [place], Annie Main L, RPH-CPP   4 months ago Hypertension associated with diabetes [facility])   [place], Annie Main L, RPH-CPP   5 months ago Hypertension associated with diabetes [facility])   [place], Jarome Matin, RPH-CPP   6 months ago Hypertension associated with diabetes [facility])   [place], MD      Future Appointments            In 2 days Charlott Rakes, MD [place]            . atorvastatin (LIPITOR) 40 MG tablet [Pharmacy Med Name: ATORVASTATIN 40MG  TABLETS] 90 tablet 0    Sig: TAKE 1 TABLET(40 MG) BY MOUTH DAILY     Cardiovascular:  Antilipid - Statins Failed - 09/10/2021  2:38 PM      Failed - Total Cholesterol in normal range and within 360 days    Cholesterol, Total  Date Value Ref Range Status  02/07/2020 147 100 - 199 mg/dL Final         Failed - LDL in normal range and within 360 days    LDL Chol Calc (NIH)  Date Value Ref Range Status  02/07/2020 92 0 - 99 mg/dL Final         Failed - HDL in normal range and within 360 days    HDL  Date Value Ref Range Status  02/07/2020 28 (L) >39 mg/dL Final         Failed - Triglycerides in normal range and within 360 days    Triglycerides  Date Value Ref Range Status  02/07/2020 150 (H) 0 - 149 mg/dL Final  Passed - Patient is not pregnant      Passed - Valid encounter within last 12 months    Recent Outpatient Visits          1 month ago Hypertension associated with diabetes [facility])   [place], Annie Main L, RPH-CPP   3 months ago Hypertension associated with diabetes [facility])   [place], Annie Main L, RPH-CPP   4 months ago Hypertension associated with diabetes [facility])   [place], Jarome Matin, RPH-CPP   5 months ago Hypertension associated with diabetes [facility])   [place], Jarome Matin, RPH-CPP   6 months ago Hypertension associated with diabetes [facility])   [place], MD      Future Appointments            In 2 days Charlott Rakes, MD [place]

## 2021-09-12 ENCOUNTER — Encounter: Payer: Self-pay | Admitting: Family Medicine

## 2021-09-12 ENCOUNTER — Ambulatory Visit: Payer: Medicare Other | Attending: Family Medicine | Admitting: Family Medicine

## 2021-09-12 ENCOUNTER — Other Ambulatory Visit: Payer: Self-pay

## 2021-09-12 VITALS — BP 161/91 | HR 72 | Ht 66.0 in | Wt 174.4 lb

## 2021-09-12 DIAGNOSIS — E1169 Type 2 diabetes mellitus with other specified complication: Secondary | ICD-10-CM | POA: Diagnosis not present

## 2021-09-12 DIAGNOSIS — E1159 Type 2 diabetes mellitus with other circulatory complications: Secondary | ICD-10-CM | POA: Diagnosis not present

## 2021-09-12 DIAGNOSIS — Z23 Encounter for immunization: Secondary | ICD-10-CM | POA: Diagnosis not present

## 2021-09-12 DIAGNOSIS — K219 Gastro-esophageal reflux disease without esophagitis: Secondary | ICD-10-CM | POA: Diagnosis not present

## 2021-09-12 DIAGNOSIS — I152 Hypertension secondary to endocrine disorders: Secondary | ICD-10-CM | POA: Diagnosis not present

## 2021-09-12 DIAGNOSIS — E785 Hyperlipidemia, unspecified: Secondary | ICD-10-CM

## 2021-09-12 LAB — POCT GLYCOSYLATED HEMOGLOBIN (HGB A1C): HbA1c, POC (controlled diabetic range): 6.4 % (ref 0.0–7.0)

## 2021-09-12 MED ORDER — PANTOPRAZOLE SODIUM 40 MG PO TBEC: 40.0000 mg | DELAYED_RELEASE_TABLET | Freq: Every day | ORAL | 1 refills | Status: DC

## 2021-09-12 MED ORDER — CARVEDILOL 6.25 MG PO TABS: 6.2500 mg | ORAL_TABLET | Freq: Two times a day (BID) | ORAL | 1 refills | Status: DC

## 2021-09-12 MED ORDER — ALLOPURINOL 100 MG PO TABS: 100.0000 mg | ORAL_TABLET | Freq: Every day | ORAL | 1 refills | Status: DC

## 2021-09-12 MED ORDER — ATORVASTATIN CALCIUM 40 MG PO TABS: ORAL_TABLET | ORAL | 1 refills | Status: DC

## 2021-09-12 MED ORDER — LISINOPRIL 40 MG PO TABS: 40.0000 mg | ORAL_TABLET | Freq: Every day | ORAL | 1 refills | Status: DC

## 2021-09-12 MED ORDER — METFORMIN HCL ER 500 MG PO TB24: ORAL_TABLET | ORAL | 1 refills | Status: DC

## 2021-09-12 MED ORDER — AMLODIPINE BESYLATE 10 MG PO TABS: 10.0000 mg | ORAL_TABLET | Freq: Every day | ORAL | 1 refills | Status: DC

## 2021-09-12 NOTE — Progress Notes (Signed)
Subjective:  Patient ID: [name], male    DOB: 1961/05/26  Age: 60 y.o. MRN: [id number]  CC: Diabetes   HPI Bayne Ellijah Leffel is a 60 y.o. year old male with a history of type 2 diabetes mellitus (A1c 6.4), hypertension, hyperlipidemia, GERD, gout, and tobacco abuse who presents for follow-up visit  Interval History: He is unhappy that his BP is elevated as he has been trying to adhere to low-sodium diet and exercising regularly.  He walks every day.  Endorses compliance with his antihypertensive and he has also been seen by the clinical pharmacist for monitoring of his blood pressure but at that visit had noted he had a high sodium meal which had caused an elevated blood pressure.  With regards to her diabetes mellitus he is doing well on metformin.  He has no neuropathy, visual concerns or hypoglycemic episodes. Last eye exam was in 10/2020 Compliant with his statin. Denies recent gout flares. GERD has been stable and only acts up when he eats spicy foods but he has been doing his PPI every day.  Past Medical History:  Diagnosis Date   Diabetes mellitus without complication (HCC)    GERD (gastroesophageal reflux disease)    Gout    Hx of adenomatous colonic polyps 10/06/2014   Hyperlipidemia    Hypertension     Past Surgical History:  Procedure Laterality Date   BACK SURGERY     COLONOSCOPY      Family History  Problem Relation Age of Onset   Colon cancer Neg Hx    Esophageal cancer Neg Hx    Rectal cancer Neg Hx    Stomach cancer Neg Hx    Colon polyps Neg Hx     No Known Allergies  Outpatient Medications Prior to Visit  Medication Sig Dispense Refill   aspirin EC 81 MG tablet Take 1 tablet (81 mg total) by mouth daily. 30 tablet 11   Blood Glucose Monitoring Suppl (ONE TOUCH ULTRA 2) w/Device KIT Use as directed 3 times daily before meals. 1 kit 0   clotrimazole (LOTRIMIN) 1 % cream Apply 1 application topically 2 (two) times daily. 30 g 0    glucose blood (ONETOUCH ULTRA) test strip Use as instructed 3 times daily 100 each 12   Lancet Devices (ACCU-CHEK SOFTCLIX) lancets Use as instructed 1 each 0   Lancets (ONETOUCH ULTRASOFT) lancets Use as instructed 3 times daily 100 each 12   allopurinol (ZYLOPRIM) 100 MG tablet Take 1 tablet (100 mg total) by mouth daily. 30 tablet 6   amLODipine (NORVASC) 10 MG tablet Take 1 tablet (10 mg total) by mouth daily. 90 tablet 1   atorvastatin (LIPITOR) 40 MG tablet TAKE 1 TABLET(40 MG) BY MOUTH DAILY 90 tablet 0   carvedilol (COREG) 3.125 MG tablet TAKE 1 TABLET(3.125 MG) BY MOUTH TWICE DAILY WITH A MEAL 60 tablet 0   lisinopril (ZESTRIL) 40 MG tablet TAKE 1 TABLET(40 MG) BY MOUTH DAILY 90 tablet 0   metFORMIN (GLUCOPHAGE-XR) 500 MG 24 hr tablet TAKE 1 TABLET(500 MG) BY MOUTH DAILY WITH BREAKFAST 90 tablet 1   pantoprazole (PROTONIX) 40 MG tablet Take 1 tablet (40 mg total) by mouth daily. 90 tablet 1   ofloxacin (FLOXIN) 0.3 % OTIC solution Place 10 drops daily into the left ear. For 7 days. (Patient not taking: No sig reported) 5 mL 0   omega-3 acid ethyl esters (LOVAZA) 1 g capsule TAKE 1 CAPSULE BY MOUTH TWICE DAILY (Patient not taking:  No sig reported) 60 capsule 2   cetirizine (ZYRTEC) 10 MG tablet Take 1 tablet (10 mg total) by mouth daily. (Patient not taking: Reported on 09/12/2021) 30 tablet 11   fluticasone (FLONASE) 50 MCG/ACT nasal spray Place 2 sprays into both nostrils daily. (Patient not taking: Reported on 09/12/2021) 16 g 6   sildenafil (VIAGRA) 50 MG tablet Take 1 tablet (50 mg total) by mouth daily as needed for erectile dysfunction. At least 24 hours between doses (Patient not taking: No sig reported) 10 tablet 3   Facility-Administered Medications Prior to Visit  Medication Dose Route Frequency Provider Last Rate Last Admin   0.9 %  sodium chloride infusion  500 mL Intravenous Once Gatha Mayer, MD       carbamide peroxide (DEBROX) 6.5 % otic solution 5 drop  5 drop Left EAR  Once [place], Dawn T, MD         ROS Review of Systems  Constitutional:  Negative for activity change and appetite change.  HENT:  Negative for sinus pressure and sore throat.   Eyes:  Negative for visual disturbance.  Respiratory:  Negative for cough, chest tightness and shortness of breath.   Cardiovascular:  Negative for chest pain and leg swelling.  Gastrointestinal:  Negative for abdominal distention, abdominal pain, constipation and diarrhea.  Endocrine: Negative.   Genitourinary:  Negative for dysuria.  Musculoskeletal:  Negative for joint swelling and myalgias.  Skin:  Negative for rash.  Allergic/Immunologic: Negative.   Neurological:  Negative for weakness, light-headedness and numbness.  Psychiatric/Behavioral:  Negative for dysphoric mood and suicidal ideas.    Objective:  BP (!) 161/91 (BP Location: Right Arm, Patient Position: Sitting, Cuff Size: Normal)   Pulse 72   Ht '5\' 6"'  (1.676 m)   Wt 174 lb 6.4 oz (79.1 kg)   SpO2 97%   BMI 28.15 kg/m   BP/Weight 09/12/2021 07/20/2021 6/44/0347  Systolic BP 425 956 387  Diastolic BP 91 75 78  Wt. (Lbs) 174.4 - 166.06  BMI 28.15 - 26.01      Physical Exam Constitutional:      Appearance: He is well-developed.  Cardiovascular:     Rate and Rhythm: Normal rate.     Heart sounds: Normal heart sounds. No murmur heard. Pulmonary:     Effort: Pulmonary effort is normal.     Breath sounds: Normal breath sounds. No wheezing or rales.  Chest:     Chest wall: No tenderness.  Abdominal:     General: Bowel sounds are normal. There is no distension.     Palpations: Abdomen is soft. There is no mass.     Tenderness: There is no abdominal tenderness.  Musculoskeletal:        General: Normal range of motion.     Right lower leg: No edema.     Left lower leg: No edema.  Neurological:     Mental Status: He is alert and oriented to person, place, and time.  Psychiatric:        Mood and Affect: Mood normal.    CMP  Latest Ref Rng & Units 04/20/2021 03/12/2021 09/06/2020  Glucose 65 - 99 mg/dL 113(H) 87 140(H)  BUN 8 - 27 mg/dL 7(L) 14 16  Creatinine 0.76 - 1.27 mg/dL 1.21 1.31(H) 1.43(H)  Sodium 134 - 144 mmol/L 143 143 143  Potassium 3.5 - 5.2 mmol/L 4.2 4.1 3.7  Chloride 96 - 106 mmol/L 105 103 104  CO2 20 - 29 mmol/L 21 21 23  Calcium 8.6 - 10.2 mg/dL 9.9 9.7 9.8  Total Protein 6.0 - 8.5 g/dL - 7.3 7.3  Total Bilirubin 0.0 - 1.2 mg/dL - 0.4 0.4  Alkaline Phos 44 - 121 IU/L - 63 65  AST 0 - 40 IU/L - 22 22  ALT 0 - 44 IU/L - 26 30    Lipid Panel     Component Value Date/Time   CHOL 147 02/07/2020 1012   TRIG 150 (H) 02/07/2020 1012   HDL 28 (L) 02/07/2020 1012   CHOLHDL 5.3 (H) 02/07/2020 1012   CHOLHDL 5.9 (H) 01/05/2016 1155   VLDL 30 01/05/2016 1155   LDLCALC 92 02/07/2020 1012    CBC    Component Value Date/Time   WBC 10.1 06/16/2017 2020   RBC 5.06 06/16/2017 2020   HGB 14.4 06/16/2017 2020   HCT 40.9 06/16/2017 2020   PLT 287 06/16/2017 2020   MCV 80.8 06/16/2017 2020   MCH 28.5 06/16/2017 2020   MCHC 35.2 06/16/2017 2020   RDW 13.7 06/16/2017 2020   LYMPHSABS 3,999 (H) 06/03/2016 1616   MONOABS 744 06/03/2016 1616   EOSABS 186 06/03/2016 1616   BASOSABS 0 06/03/2016 1616    Lab Results  Component Value Date   HGBA1C 6.4 09/12/2021    Assessment & Plan:  1. Type 2 diabetes mellitus with other specified complication, without long-term current use of insulin (HCC) Controlled with A1c of 6.4 Continue current regimen Counseled on Diabetic diet, my plate method, 962 minutes of moderate intensity exercise/week Blood sugar logs with fasting goals of 80-120 mg/dl, random of less than 180 and in the event of sugars less than 60 mg/dl or greater than 400 mg/dl encouraged to notify the clinic. Advised on the need for annual eye exams, annual foot exams, Pneumonia vaccine. - POCT glycosylated hemoglobin (Hb A1C) - Microalbumin / creatinine urine ratio - LP+Non-HDL  Cholesterol - CMP14+EGFR - metFORMIN (GLUCOPHAGE-XR) 500 MG 24 hr tablet; TAKE 1 TABLET(500 MG) BY MOUTH DAILY WITH BREAKFAST  Dispense: 90 tablet; Refill: 1  2. Hypertension associated with diabetes ([place]) Uncontrolled Coreg dose increased He will follow-up with the clinical pharmacist to reassess his blood pressure in 2 weeks Counseled on blood pressure goal of less than 130/80, low-sodium, DASH diet, medication compliance, 150 minutes of moderate intensity exercise per week. Discussed medication compliance, adverse effects. - amLODipine (NORVASC) 10 MG tablet; Take 1 tablet (10 mg total) by mouth daily.  Dispense: 90 tablet; Refill: 1 - carvedilol (COREG) 6.25 MG tablet; Take 1 tablet (6.25 mg total) by mouth 2 (two) times daily with a meal.  Dispense: 180 tablet; Refill: 1 - lisinopril (ZESTRIL) 40 MG tablet; Take 1 tablet (40 mg total) by mouth daily.  Dispense: 90 tablet; Refill: 1  3. Hyperlipidemia associated with type 2 diabetes mellitus (HCC) Controlled Low-cholesterol diet - atorvastatin (LIPITOR) 40 MG tablet; TAKE 1 TABLET(40 MG) BY MOUTH DAILY  Dispense: 90 tablet; Refill: 1  4. Gastroesophageal reflux disease without esophagitis Stable Advised to take his PPI as needed rather than round-the-clock given he has been asymptomatic - pantoprazole (PROTONIX) 40 MG tablet; Take 1 tablet (40 mg total) by mouth daily.  Dispense: 90 tablet; Refill: 1  5. Need for pneumococcal vaccine - Pneumococcal conjugate vaccine 20-valent  6. Need for immunization against influenza - Flu Vaccine QUAD 67moIM (Fluarix, Fluzone & Alfiuria Quad PF)    Meds ordered this encounter  Medications   amLODipine (NORVASC) 10 MG tablet    Sig: Take 1 tablet (10  mg total) by mouth daily.    Dispense:  90 tablet    Refill:  1   atorvastatin (LIPITOR) 40 MG tablet    Sig: TAKE 1 TABLET(40 MG) BY MOUTH DAILY    Dispense:  90 tablet    Refill:  1   carvedilol (COREG) 6.25 MG tablet    Sig: Take 1  tablet (6.25 mg total) by mouth 2 (two) times daily with a meal.    Dispense:  180 tablet    Refill:  1   allopurinol (ZYLOPRIM) 100 MG tablet    Sig: Take 1 tablet (100 mg total) by mouth daily.    Dispense:  90 tablet    Refill:  1    Start on 03/19/21   lisinopril (ZESTRIL) 40 MG tablet    Sig: Take 1 tablet (40 mg total) by mouth daily.    Dispense:  90 tablet    Refill:  1   pantoprazole (PROTONIX) 40 MG tablet    Sig: Take 1 tablet (40 mg total) by mouth daily.    Dispense:  90 tablet    Refill:  1   metFORMIN (GLUCOPHAGE-XR) 500 MG 24 hr tablet    Sig: TAKE 1 TABLET(500 MG) BY MOUTH DAILY WITH BREAKFAST    Dispense:  90 tablet    Refill:  1    Follow-up: Return in about 2 weeks (around 09/26/2021) for Blood pressure follow-up with Lurena Joiner; 6 months PCP-diabetes.       Charlott Rakes, MD, FAAFP. [facility] [place], [place]   09/12/2021, 11:16 AM

## 2021-09-12 NOTE — Patient Instructions (Signed)

## 2021-09-13 LAB — LP+NON-HDL CHOLESTEROL
Cholesterol, Total: 171 mg/dL (ref 100–199)
HDL: 33 mg/dL — ABNORMAL LOW (ref >39)
LDL Chol Calc (NIH): 115 mg/dL — ABNORMAL HIGH (ref 0–99)
Total Non-HDL-Chol (LDL+VLDL): 138 mg/dL — ABNORMAL HIGH (ref 0–129)
Triglycerides: 129 mg/dL (ref 0–149)
VLDL Cholesterol Cal: 23 mg/dL (ref 5–40)

## 2021-09-13 LAB — CMP14+EGFR
ALT: 30 IU/L (ref 0–44)
AST: 21 IU/L (ref 0–40)
Albumin/Globulin Ratio: 2 (ref 1.2–2.2)
Albumin: 4.7 g/dL (ref 3.8–4.8)
Alkaline Phosphatase: 65 IU/L (ref 44–121)
BUN/Creatinine Ratio: 12 (ref 10–24)
BUN: 13 mg/dL (ref 8–27)
Bilirubin Total: 0.3 mg/dL (ref 0.0–1.2)
CO2: 24 mmol/L (ref 20–29)
Calcium: 9.7 mg/dL (ref 8.6–10.2)
Chloride: 105 mmol/L (ref 96–106)
Creatinine, Ser: 1.13 mg/dL (ref 0.76–1.27)
Globulin, Total: 2.4 g/dL (ref 1.5–4.5)
Glucose: 133 mg/dL — ABNORMAL HIGH (ref 70–99)
Potassium: 4.2 mmol/L (ref 3.5–5.2)
Sodium: 143 mmol/L (ref 134–144)
Total Protein: 7.1 g/dL (ref 6.0–8.5)
eGFR: 74 mL/min/{1.73_m2} (ref >59)

## 2021-09-13 LAB — MICROALBUMIN / CREATININE URINE RATIO
Creatinine, Urine: 90 mg/dL
Microalb/Creat Ratio: 6 mg/g creat (ref 0–29)
Microalbumin, Urine: 5.7 ug/mL

## 2021-09-14 ENCOUNTER — Other Ambulatory Visit: Payer: Self-pay | Admitting: Family Medicine

## 2021-09-14 DIAGNOSIS — E785 Hyperlipidemia, unspecified: Secondary | ICD-10-CM

## 2021-09-14 DIAGNOSIS — E1169 Type 2 diabetes mellitus with other specified complication: Secondary | ICD-10-CM

## 2021-09-14 MED ORDER — ATORVASTATIN CALCIUM 80 MG PO TABS: ORAL_TABLET | ORAL | 1 refills | Status: DC

## 2021-09-17 ENCOUNTER — Telehealth: Payer: Self-pay

## 2021-09-17 NOTE — Telephone Encounter (Signed)
Patient name and DOB has been verified Patient was informed of lab results. Patient had no questions.  

## 2021-09-17 NOTE — Telephone Encounter (Signed)
-----   Message from Charlott Rakes, MD sent at 09/14/2021  5:19 PM EST ----- Please inform him cholesterol is slightly elevated and I have sent an increased dose of Lipitor 80mg  to his Pharmacy. If he has 40mg  tas he can take two of those to make 80mg . Low cholesterol diet will be beneficial. Other labs are stable.

## 2021-09-26 ENCOUNTER — Ambulatory Visit: Payer: Medicare Other | Admitting: Pharmacist

## 2021-10-04 ENCOUNTER — Other Ambulatory Visit: Payer: Self-pay

## 2021-10-04 ENCOUNTER — Telehealth: Payer: Self-pay | Admitting: Family Medicine

## 2021-10-04 ENCOUNTER — Ambulatory Visit: Payer: Medicare Other | Attending: Family Medicine | Admitting: Pharmacist

## 2021-10-04 VITALS — BP 137/73 | HR 58

## 2021-10-04 DIAGNOSIS — I152 Hypertension secondary to endocrine disorders: Secondary | ICD-10-CM

## 2021-10-04 DIAGNOSIS — E1159 Type 2 diabetes mellitus with other circulatory complications: Secondary | ICD-10-CM | POA: Diagnosis not present

## 2021-10-04 NOTE — Telephone Encounter (Signed)
Disability paperwork for plaque

## 2021-10-04 NOTE — Telephone Encounter (Signed)
Form has been received and patient will be called once complete.

## 2021-10-04 NOTE — Progress Notes (Signed)
   S:    PCP: Dr. Margarita Rana   Patient arrives in good spirits.  Presents to the clinic for hypertension evaluation, counseling, and management. Patient was referred and last seen by Primary Care Provider on 09/12/2021. BP was 161/91 at that visit. Carvedilol dose was increased. Today, pt tells me he may have been anxious at that visit.  Medication adherence reported. Did take this morning. Denies any side effects to the carvedilol. Denies chest pains/dyspnea/HA/blurred vision.  Current BP Medications include:  amlodipine 10 mg daily, carvedilol 6.25 mg BID, lisinopril 40 mg daily   Dietary habits include: more compliant with salt restriction. Ate a fried bologna sandwich this morning but is trying to limit sodium.  Exercise habits include: walks ~0.5 mile daily  Family / Social history:  -Fhx: none -Tobacco: 0.5 PPD every day smoker  -Alcohol: none   O:  Vitals:   10/04/21 1402  BP: 137/73  Pulse: (!) 58    Home BP readings: none  Last 3 Office BP readings: BP Readings from Last 3 Encounters:  10/04/21 137/73  09/12/21 (!) 161/91  07/20/21 (!) 144/75   BMET    Component Value Date/Time   NA 143 09/12/2021 0943   K 4.2 09/12/2021 0943   CL 105 09/12/2021 0943   CO2 24 09/12/2021 0943   GLUCOSE 133 (H) 09/12/2021 0943   GLUCOSE 78 06/16/2017 2020   BUN 13 09/12/2021 0943   CREATININE 1.13 09/12/2021 0943   CREATININE 1.53 (H) 12/04/2016 0916   CALCIUM 9.7 09/12/2021 0943   GFRNONAA 53 (L) 09/06/2020 1638   GFRNONAA 50 (L) 12/04/2016 0916   GFRAA 61 09/06/2020 1638   GFRAA 58 (L) 12/04/2016 0916    Renal function: CrCl cannot be calculated (Patient's most recent lab result is older than the maximum 21 days allowed.).  Clinical ASCVD: No  The 10-year ASCVD risk score (Arnett DK, et al., 2019) is: 45.7%   Values used to calculate the score:     Age: 60 years     Sex: Male     Is Non-Hispanic African American: Yes     Diabetic: Yes     Tobacco smoker: Yes      Systolic Blood Pressure: 060 mmHg     Is BP treated: Yes     HDL Cholesterol: 33 mg/dL     Total Cholesterol: 171 mg/dL   A/P: Hypertension longstanding currently close to goal on current medications. BP Goal = < 130/80 mmHg. Medication adherence reported. No changes today.  -Continue carvedilol, lisinopril, amlodipine at current doses.   -F/u labs ordered - none -Counseled on lifestyle modifications for blood pressure control including reduced dietary sodium, increased exercise, adequate sleep.  Results reviewed and written information provided. Total time in face-to-face counseling 15 minutes.   F/U Clinic Visit in May with Dr. Margarita Rana.   Benard Halsted, PharmD, [place], [place] [phone]

## 2021-10-05 NOTE — Telephone Encounter (Signed)
I do not see any indication for handicap placard from his chart.

## 2021-10-05 NOTE — Telephone Encounter (Signed)
Pt states that he has a fusion surgery and he states that he can not walk a long distance He states that he has been handicapp for over 10 years.

## 2021-10-05 NOTE — Telephone Encounter (Signed)
Paitent called in not understanding why paperwork wasn't signed for handicappe sticker, says he's been this way over 10 years. Please call back

## 2021-10-08 NOTE — Telephone Encounter (Signed)
Completed.

## 2021-10-22 ENCOUNTER — Other Ambulatory Visit: Payer: Self-pay | Admitting: Family Medicine

## 2021-10-22 DIAGNOSIS — E1159 Type 2 diabetes mellitus with other circulatory complications: Secondary | ICD-10-CM

## 2021-12-10 ENCOUNTER — Other Ambulatory Visit: Payer: Self-pay | Admitting: Family Medicine

## 2021-12-10 DIAGNOSIS — E1159 Type 2 diabetes mellitus with other circulatory complications: Secondary | ICD-10-CM

## 2021-12-10 DIAGNOSIS — I152 Hypertension secondary to endocrine disorders: Secondary | ICD-10-CM

## 2021-12-11 NOTE — Telephone Encounter (Signed)
Duplicate request, refused in another encounter today.

## 2021-12-11 NOTE — Telephone Encounter (Signed)
[place] called and spoke to [place], [facility] about the refill(s) Lisinopril requested. Advised it was sent on 09/12/21 #90/1 refill(s). He says the patient picked it up yesterday and the request came out before they could stop it, so no refill is needed at this time. Will refuse this request.  Requested Prescriptions  Pending Prescriptions Disp Refills   lisinopril (ZESTRIL) 40 MG tablet [Pharmacy Med Name: LISINOPRIL 40MG  TABLETS] 90 tablet 1    Sig: TAKE 1 TABLET(40 MG) BY MOUTH DAILY     Cardiovascular:  ACE Inhibitors Passed - 12/10/2021 12:55 PM      Passed - Cr in normal range and within 180 days    Creat  Date Value Ref Range Status  12/04/2016 1.53 (H) 0.70 - 1.33 mg/dL Final    Comment:      For patients > or = 62 years of age: The upper reference limit for Creatinine is approximately 13% higher for people identified as African-American.      Creatinine, Ser  Date Value Ref Range Status  09/12/2021 1.13 0.76 - 1.27 mg/dL Final   Creatinine, Urine  Date Value Ref Range Status  06/03/2016 145 20 - 370 mg/dL Final          Passed - K in normal range and within 180 days    Potassium  Date Value Ref Range Status  09/12/2021 4.2 3.5 - 5.2 mmol/L Final          Passed - Patient is not pregnant      Passed - Last BP in normal range    BP Readings from Last 1 Encounters:  10/04/21 137/73          Passed - Valid encounter within last 6 months    Recent Outpatient Visits           2 months ago Hypertension associated with diabetes [facility])   [place], Annie Main L, RPH-CPP   3 months ago Type 2 diabetes mellitus with other specified complication, without long-term current use of insulin ([place])   [place], Charlane Ferretti, MD   4 months ago Hypertension associated with diabetes [facility])   [place], Annie Main L, RPH-CPP   6 months ago Hypertension  associated with diabetes [facility])   [place], Annie Main L, RPH-CPP   7 months ago Hypertension associated with diabetes [facility])   [place], RPH-CPP       Future Appointments             In 3 months Charlott Rakes, MD [place]

## 2021-12-11 NOTE — Telephone Encounter (Signed)
Duplicate request, refused in another encounter today. Will refuse this.

## 2022-01-11 DIAGNOSIS — E119 Type 2 diabetes mellitus without complications: Secondary | ICD-10-CM | POA: Diagnosis not present

## 2022-01-11 DIAGNOSIS — H2513 Age-related nuclear cataract, bilateral: Secondary | ICD-10-CM | POA: Diagnosis not present

## 2022-01-11 LAB — HM DIABETES EYE EXAM

## 2022-02-07 ENCOUNTER — Ambulatory Visit: Payer: Medicare Other | Attending: Physician Assistant | Admitting: Physician Assistant

## 2022-02-07 ENCOUNTER — Encounter: Payer: Self-pay | Admitting: Physician Assistant

## 2022-02-07 VITALS — BP 128/78 | HR 66 | Wt 170.8 lb

## 2022-02-07 DIAGNOSIS — K219 Gastro-esophageal reflux disease without esophagitis: Secondary | ICD-10-CM

## 2022-02-07 DIAGNOSIS — I152 Hypertension secondary to endocrine disorders: Secondary | ICD-10-CM | POA: Diagnosis not present

## 2022-02-07 DIAGNOSIS — E1159 Type 2 diabetes mellitus with other circulatory complications: Secondary | ICD-10-CM | POA: Diagnosis not present

## 2022-02-07 DIAGNOSIS — E1169 Type 2 diabetes mellitus with other specified complication: Secondary | ICD-10-CM

## 2022-02-07 DIAGNOSIS — M1A0710 Idiopathic chronic gout, right ankle and foot, without tophus (tophi): Secondary | ICD-10-CM | POA: Diagnosis not present

## 2022-02-07 DIAGNOSIS — E785 Hyperlipidemia, unspecified: Secondary | ICD-10-CM

## 2022-02-07 LAB — POCT GLYCOSYLATED HEMOGLOBIN (HGB A1C): HbA1c, POC (controlled diabetic range): 6.6 % (ref 0.0–7.0)

## 2022-02-07 LAB — GLUCOSE, POCT (MANUAL RESULT ENTRY): POC Glucose: 114 mg/dl — AB (ref 70–99)

## 2022-02-07 MED ORDER — PANTOPRAZOLE SODIUM 40 MG PO TBEC: 40.0000 mg | DELAYED_RELEASE_TABLET | Freq: Every day | ORAL | 1 refills | Status: DC

## 2022-02-07 MED ORDER — CARVEDILOL 6.25 MG PO TABS: 6.2500 mg | ORAL_TABLET | Freq: Two times a day (BID) | ORAL | 1 refills | Status: DC

## 2022-02-07 MED ORDER — ALLOPURINOL 100 MG PO TABS: 100.0000 mg | ORAL_TABLET | Freq: Every day | ORAL | 1 refills | Status: DC

## 2022-02-07 MED ORDER — ATORVASTATIN CALCIUM 80 MG PO TABS: ORAL_TABLET | ORAL | 1 refills | Status: DC

## 2022-02-07 MED ORDER — METFORMIN HCL ER 500 MG PO TB24: ORAL_TABLET | ORAL | 1 refills | Status: DC

## 2022-02-07 MED ORDER — LISINOPRIL 40 MG PO TABS: 40.0000 mg | ORAL_TABLET | Freq: Every day | ORAL | 1 refills | Status: DC

## 2022-02-07 MED ORDER — AMLODIPINE BESYLATE 10 MG PO TABS: 10.0000 mg | ORAL_TABLET | Freq: Every day | ORAL | 1 refills | Status: DC

## 2022-02-07 NOTE — Progress Notes (Signed)
Patient ID: Henry Walker, male   DOB: 1960-11-20, 61 y.o.   MRN: [id number] ? ? ?Henry Walker, is a 61 y.o. male ? ?LOV:[id number] ? ?OAC:[id number] ? ?DOB - 10-28-1961 ? ?No chief complaint on file. ?    ? ?Subjective:  ? ?Henry Walker is a 61 y.o. male here today for med RF.  He denies any issues or concerns today.  Still smoking but <1ppd.  No CP/HA/SOB.  He usu walks for exercise almost daily.   ? ? ?No problems updated. ? ?ALLERGIES: ?No Known Allergies ? ?PAST MEDICAL HISTORY: ?Past Medical History:  ?Diagnosis Date  ? Diabetes mellitus without complication ([place])   ? GERD (gastroesophageal reflux disease)   ? Gout   ? Hx of adenomatous colonic polyps 10/06/2014  ? Hyperlipidemia   ? Hypertension   ? ? ?MEDICATIONS AT HOME: ?Prior to Admission medications   ?Medication Sig Start Date End Date Taking? Authorizing Provider  ?allopurinol (ZYLOPRIM) 100 MG tablet Take 1 tablet (100 mg total) by mouth daily. 02/07/22   Argentina Donovan, PA-C  ?amLODipine (NORVASC) 10 MG tablet Take 1 tablet (10 mg total) by mouth daily. 02/07/22   Argentina Donovan, PA-C  ?aspirin EC 81 MG tablet Take 1 tablet (81 mg total) by mouth daily. ?Patient not taking: Reported on 02/07/2022 12/04/16   Lottie Mussel T, MD  ?atorvastatin (LIPITOR) 80 MG tablet TAKE 1 TABLET(40 MG) BY MOUTH DAILY 02/07/22   Argentina Donovan, PA-C  ?Blood Glucose Monitoring Suppl (ONE TOUCH ULTRA 2) w/Device KIT Use as directed 3 times daily before meals. ?Patient not taking: Reported on 02/07/2022 07/06/19   Charlott Rakes, MD  ?carvedilol (COREG) 6.25 MG tablet Take 1 tablet (6.25 mg total) by mouth 2 (two) times daily with a meal. 02/07/22   Argentina Donovan, PA-C  ?clotrimazole (LOTRIMIN) 1 % cream Apply 1 application topically 2 (two) times daily. ?Patient not taking: Reported on 02/07/2022 10/05/19   Charlott Rakes, MD  ?glucose blood (ONETOUCH ULTRA) test strip Use as instructed 3 times daily ?Patient not taking: Reported on 02/07/2022 07/06/19    Charlott Rakes, MD  ?Lancet Devices [facility]) lancets Use as instructed ?Patient not taking: Reported on 02/07/2022 08/04/15   Lance Bosch, NP  ?Lancets (ONETOUCH ULTRASOFT) lancets Use as instructed 3 times daily ?Patient not taking: Reported on 02/07/2022 07/06/19   Charlott Rakes, MD  ?lisinopril (ZESTRIL) 40 MG tablet Take 1 tablet (40 mg total) by mouth daily. 02/07/22   Argentina Donovan, PA-C  ?metFORMIN (GLUCOPHAGE-XR) 500 MG 24 hr tablet 1 bid with food 02/07/22   Argentina Donovan, PA-C  ?ofloxacin (FLOXIN) 0.3 % OTIC solution Place 10 drops daily into the left ear. For 7 days. ?Patient not taking: Reported on 06/18/2021 09/11/17   Alfonse Spruce, FNP  ?pantoprazole (PROTONIX) 40 MG tablet Take 1 tablet (40 mg total) by mouth daily. 02/07/22   Argentina Donovan, PA-C  ?tadalafil (CIALIS) 20 MG tablet Take 0.5-1 tablets (10-20 mg total) by mouth every other day as needed for erectile dysfunction. ?Patient not taking: Reported on 09/19/2014 07/21/14 07/17/15  Lorayne Marek, MD  ? ? ?ROS: ?Neg HEENT ?Neg resp ?Neg cardiac ?Neg GI ?Neg GU ?Neg MS ?Neg psych ?Neg neuro ? ?Objective:  ? ?Vitals:  ? 02/07/22 1359  ?BP: 128/78  ?Pulse: 66  ?SpO2: 96%  ?Weight: 170 lb 12.8 oz (77.5 kg)  ? ?Exam ?General appearance : Awake, alert, not in any distress. Speech Clear. Not toxic looking ?  HEENT: Atraumatic and Normocephalic ?Neck: Supple, no JVD. No cervical lymphadenopathy.  ?Chest: Good air entry bilaterally, CTAB.  No rales/rhonchi/wheezing ?CVS: S1 S2 regular, no murmurs. ?Extremities: B/L Lower Ext shows no edema, both legs are warm to touch ?Neurology: Awake alert, and oriented X 3, CN II-XII intact, Non focal ?Skin: No Rash ? ?Data Review ?Lab Results  ?Component Value Date  ? HGBA1C 6.6 02/07/2022  ? HGBA1C 6.4 09/12/2021  ? HGBA1C 6.5 09/06/2020  ? ? ?Assessment & Plan  ? ?1. Type 2 diabetes mellitus with other specified complication, without long-term current use of insulin ([place]) ?Continue  metformin ?- Glucose (CBG) ?- HgB A1c ?- Lipid panel ?- CBC with Differential/Platelet ?- metFORMIN (GLUCOPHAGE-XR) 500 MG 24 hr tablet; 1 bid with food  Dispense: 180 tablet; Refill: 1 ? ?2. Hyperlipidemia associated with type 2 diabetes mellitus ([place]) ?- Lipid panel ?- Comprehensive metabolic panel ?- CBC with Differential/Platelet ?- atorvastatin (LIPITOR) 80 MG tablet; TAKE 1 TABLET(40 MG) BY MOUTH DAILY  Dispense: 90 tablet; Refill: 1 ? ?3. Hypertension associated with diabetes ([place]) ?controlled ?- Lipid panel ?- Comprehensive metabolic panel ?- CBC with Differential/Platelet ?- amLODipine (NORVASC) 10 MG tablet; Take 1 tablet (10 mg total) by mouth daily.  Dispense: 90 tablet; Refill: 1 ?- lisinopril (ZESTRIL) 40 MG tablet; Take 1 tablet (40 mg total) by mouth daily.  Dispense: 90 tablet; Refill: 1 ?- carvedilol (COREG) 6.25 MG tablet; Take 1 tablet (6.25 mg total) by mouth 2 (two) times daily with a meal.  Dispense: 180 tablet; Refill: 1 ? ?4. Gastroesophageal reflux disease without esophagitis ?stable ?- pantoprazole (PROTONIX) 40 MG tablet; Take 1 tablet (40 mg total) by mouth daily.  Dispense: 90 tablet; Refill: 1 ? ?5. Chronic idiopathic gout involving toe of right foot without tophus ?- allopurinol (ZYLOPRIM) 100 MG tablet; Take 1 tablet (100 mg total) by mouth daily.  Dispense: 90 tablet; Refill: 1 ? ?Smoking and dangers of nicotine have been discussed at length. Long term health consequences of smoking reviewed in detail.  Methods for helping with cessation have been reviewed.  Patient expresses understanding. ? ? ?Patient have been counseled extensively about nutrition and exercise. Other issues discussed during this visit include: low cholesterol diet, weight control and daily exercise, foot care, annual eye examinations at Ophthalmology, importance of adherence with medications and regular follow-up. We also discussed long term complications of uncontrolled diabetes and hypertension.  ? ?Return in  about 4 months (around 06/09/2022) for PCP for chronic conditio. ? ?The patient was given clear instructions to go to ER or return to medical center if symptoms don't improve, worsen or new problems develop. The patient verbalized understanding. The patient was told to call to get lab results if they haven't heard anything in the next week.  ? ? ? ? ?Freeman Caldron, PA-C ?[facility] ?[place], [place] ?[phone]   ?02/07/2022, 2:20 PM  ?

## 2022-02-07 NOTE — Patient Instructions (Signed)
Tobacco Use Disorder ?Tobacco use disorder (TUD) occurs when a person craves, seeks, and uses tobacco, regardless of the consequences. This disorder can cause problems with mental and physical health. It can affect your ability to have healthy relationships, and it can keep you from meeting your responsibilities at work, home, or school. ?Tobacco may be: ?Smoked as a cigarette or cigar. ?Inhaled using e-cigarettes. ?Smoked in a pipe or hookah. ?Chewed as smokeless tobacco. ?Inhaled into the nostrils as snuff. ?Tobacco products contain a dangerous chemical called nicotine, which is very addictive. Nicotine triggers hormones that make the body feel stimulated and works on areas of the brain that make you feel good. These effects can make it hard for people to quit nicotine. ?Tobacco contains many other unsafe chemicals that can damage almost every organ in the body. Smoking tobacco also puts others in danger due to fire risk and possible health problems caused by breathing in secondhand smoke. ?What are the signs or symptoms? ?Symptoms of TUD may include: ?Being unable to slow down or stop your tobacco use. ?Spending an abnormal amount of time getting or using tobacco. ?Craving tobacco. Cravings may last for up to 6 months after quitting. ?Tobacco use that: ?Interferes with your work, school, or home life. ?Interferes with your personal and social relationships. ?Makes you give up activities that you once enjoyed or found important. ?Using tobacco even though you know that it is: ?Dangerous or bad for your health or someone else's health. ?Causing problems in your life. ?Needing more and more of the substance to get the same effect (developing tolerance). ?Experiencing unpleasant symptoms if you do not use the substance (withdrawal). Withdrawal symptoms may include: ?Depressed, anxious, or irritable mood. ?Difficulty concentrating. ?Increased appetite. ?Restlessness or trouble sleeping. ?Using the substance to avoid  withdrawal. ?How is this diagnosed? ?This condition may be diagnosed based on: ?Your current and past tobacco use. Your health care provider may ask questions about how your tobacco use affects your life. ?A physical exam. ?You may be diagnosed with TUD if you have at least two symptoms within a 88-monthperiod. ?How is this treated? ?This condition is treated by stopping tobacco use. Many people are unable to quit on their own and need help. Treatment may include: ?Nicotine replacement therapy (NRT). NRT provides nicotine without the other harmful chemicals in tobacco. NRT gradually lowers the dosage of nicotine in the body and reduces withdrawal symptoms. NRT is available as: ?Over-the-counter gums, lozenges, and skin patches. ?Prescription mouth inhalers and nasal sprays. ?Medicine that acts on the brain to reduce cravings and withdrawal symptoms. ?A type of talk therapy that examines your triggers for tobacco use, how to avoid them, and how to cope with cravings (behavioral therapy). ?Hypnosis. This may help with withdrawal symptoms. ?Joining a support group for others coping with TUD. ?The best treatment for TUD is usually a combination of medicine, talk therapy, and support groups. Recovery can be a long process. Many people start using tobacco again after stopping (relapse). If you relapse, it does not mean that treatment will not work. ?Follow these instructions at home: ?Lifestyle ?Do not use any products that contain nicotine or tobacco, such as cigarettes and e-cigarettes. ?Avoid things that trigger tobacco use as much as you can. Triggers include people and situations that usually cause you to use tobacco. ?Avoid drinks that contain caffeine, including coffee. These may worsen some withdrawal symptoms. ?Find ways to manage stress. Wanting to smoke may cause stress, and stress can make you want to  smoke. Relaxation techniques such as deep breathing, meditation, and yoga may help. ?Attend support groups as  needed. These groups are an important part of long-term recovery for many people. ?General instructions ?Take over-the-counter and prescription medicines only as told by your health care provider. ?Check with your health care provider before taking any new prescription or over-the-counter medicines. ?Decide on a friend, family member, or smoking quit-line (such as 1-800-QUIT-NOW in the [place] that you can call or text when you feel the urge to smoke or when you need help coping with cravings. ?Keep all follow-up visits as told by your health care provider and therapist. This is important. ?Contact a health care provider if: ?You are not able to take your medicines as prescribed. ?Your symptoms get worse, even with treatment. ?Summary ?Tobacco use disorder (TUD) occurs when a person craves, seeks, and uses tobacco regardless of the consequences. ?This condition may be diagnosed based on your current and past tobacco use and a physical exam. ?Many people are unable to quit on their own and need help. Recovery can be a long process. ?The most effective treatment for TUD is usually a combination of medicine, talk therapy, and support groups. ?This information is not intended to replace advice given to you by your health care provider. Make sure you discuss any questions you have with your health care provider. ?Document Revised: 06/22/2021 Document Reviewed: 09/05/2020 ?Elsevier Patient Education ? [place]. ? ?

## 2022-02-08 LAB — CBC WITH DIFFERENTIAL/PLATELET
Basophils Absolute: 0.1 10*3/uL (ref 0.0–0.2)
Basos: 1 %
EOS (ABSOLUTE): 0.2 10*3/uL (ref 0.0–0.4)
Eos: 2 %
Hematocrit: 44.9 % (ref 37.5–51.0)
Hemoglobin: 15.1 g/dL (ref 13.0–17.7)
Immature Grans (Abs): 0 10*3/uL (ref 0.0–0.1)
Immature Granulocytes: 0 %
Lymphocytes Absolute: 3.8 10*3/uL — ABNORMAL HIGH (ref 0.7–3.1)
Lymphs: 39 %
MCH: 27.3 pg (ref 26.6–33.0)
MCHC: 33.6 g/dL (ref 31.5–35.7)
MCV: 81 fL (ref 79–97)
Monocytes Absolute: 0.8 10*3/uL (ref 0.1–0.9)
Monocytes: 8 %
Neutrophils Absolute: 4.8 10*3/uL (ref 1.4–7.0)
Neutrophils: 50 %
Platelets: 315 10*3/uL (ref 150–450)
RBC: 5.54 x10E6/uL (ref 4.14–5.80)
RDW: 13.7 % (ref 11.6–15.4)
WBC: 9.7 10*3/uL (ref 3.4–10.8)

## 2022-02-08 LAB — COMPREHENSIVE METABOLIC PANEL
ALT: 31 IU/L (ref 0–44)
AST: 23 IU/L (ref 0–40)
Albumin/Globulin Ratio: 1.6 (ref 1.2–2.2)
Albumin: 4.4 g/dL (ref 3.8–4.8)
Alkaline Phosphatase: 77 IU/L (ref 44–121)
BUN/Creatinine Ratio: 8 — ABNORMAL LOW (ref 10–24)
BUN: 10 mg/dL (ref 8–27)
Bilirubin Total: 0.6 mg/dL (ref 0.0–1.2)
CO2: 21 mmol/L (ref 20–29)
Calcium: 10 mg/dL (ref 8.6–10.2)
Chloride: 104 mmol/L (ref 96–106)
Creatinine, Ser: 1.2 mg/dL (ref 0.76–1.27)
Globulin, Total: 2.8 g/dL (ref 1.5–4.5)
Glucose: 102 mg/dL — ABNORMAL HIGH (ref 70–99)
Potassium: 4.1 mmol/L (ref 3.5–5.2)
Sodium: 141 mmol/L (ref 134–144)
Total Protein: 7.2 g/dL (ref 6.0–8.5)
eGFR: 69 mL/min/{1.73_m2} (ref >59)

## 2022-02-08 LAB — LIPID PANEL
Chol/HDL Ratio: 4.6 ratio (ref 0.0–5.0)
Cholesterol, Total: 132 mg/dL (ref 100–199)
HDL: 29 mg/dL — ABNORMAL LOW (ref >39)
LDL Chol Calc (NIH): 86 mg/dL (ref 0–99)
Triglycerides: 89 mg/dL (ref 0–149)
VLDL Cholesterol Cal: 17 mg/dL (ref 5–40)

## 2022-03-12 ENCOUNTER — Encounter: Payer: Self-pay | Admitting: Family Medicine

## 2022-03-12 ENCOUNTER — Ambulatory Visit: Payer: Medicare Other | Attending: Family Medicine | Admitting: Family Medicine

## 2022-03-12 VITALS — BP 147/75 | HR 54 | Ht 66.0 in | Wt 173.0 lb

## 2022-03-12 DIAGNOSIS — E1169 Type 2 diabetes mellitus with other specified complication: Secondary | ICD-10-CM | POA: Diagnosis not present

## 2022-03-12 DIAGNOSIS — I152 Hypertension secondary to endocrine disorders: Secondary | ICD-10-CM

## 2022-03-12 DIAGNOSIS — E1159 Type 2 diabetes mellitus with other circulatory complications: Secondary | ICD-10-CM | POA: Diagnosis not present

## 2022-03-12 DIAGNOSIS — F17210 Nicotine dependence, cigarettes, uncomplicated: Secondary | ICD-10-CM

## 2022-03-12 DIAGNOSIS — E785 Hyperlipidemia, unspecified: Secondary | ICD-10-CM

## 2022-03-12 NOTE — Patient Instructions (Signed)

## 2022-03-12 NOTE — Progress Notes (Signed)
? ?Subjective:  ?Patient ID: Henry Walker, male    DOB: 07-Sep-1961  Age: 61 y.o. MRN: [id number] ? ?CC: Hypertension ? ? ?HPI ?Henry Walker is a 61 y.o. year old male with a history of type 2 diabetes mellitus (A1c 6.6, hypertension, hyperlipidemia, GERD, gout, and tobacco abuse (0.5 ppd since he was 59) who presents for follow-up visit ?He was seen by the physician assistant last month for chronic disease management ? ?Interval History: ?BP slightly elevated today but was normal at last visit 1 month ago.  Endorses compliance with his antihypertensive and his statin. ? ?Not ready to quit smoking.  States he smokes for stress relief. ? ?Doing well on metformin for his diabetes. ?Denies hypoglycemia, neuropathy, visual concerns  ?Last eye exam was in 11/2021 ?He walks daily for exercise.  He is unable to do any high intensity exercise due to his back pain. ?Past Medical History:  ?Diagnosis Date  ? Diabetes mellitus without complication ([place])   ? GERD (gastroesophageal reflux disease)   ? Gout   ? Hx of adenomatous colonic polyps 10/06/2014  ? Hyperlipidemia   ? Hypertension   ? ? ?Past Surgical History:  ?Procedure Laterality Date  ? BACK SURGERY    ? COLONOSCOPY    ? ? ?Family History  ?Problem Relation Age of Onset  ? Colon cancer Neg Hx   ? Esophageal cancer Neg Hx   ? Rectal cancer Neg Hx   ? Stomach cancer Neg Hx   ? Colon polyps Neg Hx   ? ? ?Social History  ? ?Socioeconomic History  ? Marital status: Divorced  ?  Spouse name: Not on file  ? Number of children: Not on file  ? Years of education: Not on file  ? Highest education level: Not on file  ?Occupational History  ? Not on file  ?Tobacco Use  ? Smoking status: Every Day  ?  Packs/day: 0.50  ?  Types: Cigarettes  ? Smokeless tobacco: Never  ?Vaping Use  ? Vaping Use: Never used  ?Substance and Sexual Activity  ? Alcohol use: No  ?  Alcohol/week: 0.0 standard drinks  ? Drug use: No  ? Sexual activity: Not Currently  ?Other Topics Concern   ? Not on file  ?Social History Narrative  ? 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 ?Social Determinants of Health  ? ?Financial Resource Strain: Not on file  ?Food Insecurity: Not on file  ?Transportation Needs: Not on file  ?Physical Activity: Not on file  ?Stress: Not on file  ?Social Connections: Not on file  ? ? ?No Known Allergies ? ?Outpatient Medications Prior to Visit  ?Medication Sig Dispense Refill  ? allopurinol (ZYLOPRIM) 100 MG tablet Take 1 tablet (100 mg total) by mouth daily. 90 tablet 1  ? amLODipine (NORVASC) 10 MG tablet Take 1 tablet (10 mg total) by mouth daily. 90 tablet 1  ? aspirin EC 81 MG tablet Take 1 tablet (81 mg total) by mouth daily. 30 tablet 11  ? atorvastatin (LIPITOR) 80 MG tablet TAKE 1 TABLET(40 MG) BY MOUTH DAILY 90 tablet 1  ? Blood Glucose Monitoring Suppl (ONE TOUCH ULTRA 2) w/Device KIT Use as directed 3 times daily before meals. 1 kit 0  ? carvedilol (COREG) 6.25 MG tablet Take 1 tablet (6.25 mg total) by mouth 2 (two) times daily with a meal. 180 tablet 1  ? clotrimazole (LOTRIMIN) 1 % cream Apply 1 application topically 2 (two) times daily. 30 g 0  ?  glucose blood (ONETOUCH ULTRA) test strip Use as instructed 3 times daily 100 each 12  ? Lancet Devices (ACCU-CHEK SOFTCLIX) lancets Use as instructed 1 each 0  ? Lancets (ONETOUCH ULTRASOFT) lancets Use as instructed 3 times daily 100 each 12  ? lisinopril (ZESTRIL) 40 MG tablet Take 1 tablet (40 mg total) by mouth daily. 90 tablet 1  ? metFORMIN (GLUCOPHAGE-XR) 500 MG 24 hr tablet 1 bid with food 180 tablet 1  ? ofloxacin (FLOXIN) 0.3 % OTIC solution Place 10 drops daily into the left ear. For 7 days. 5 mL 0  ? pantoprazole (PROTONIX) 40 MG tablet Take 1 tablet (40 mg total) by mouth daily. 90 tablet 1  ? ?Facility-Administered Medications Prior to Visit  ?Medication Dose Route Frequency Provider Last Rate Last Admin  ? 0.9 %  sodium chloride infusion  500 mL Intravenous Once Gatha Mayer, MD      ?  carbamide peroxide (DEBROX) 6.5 % otic solution 5 drop  5 drop Left EAR Once Maren Reamer, MD      ? ? ? ?ROS ?Review of Systems  ?Constitutional:  Negative for activity change and appetite change.  ?HENT:  Negative for sinus pressure and sore throat.   ?Respiratory:  Negative for chest tightness, shortness of breath and wheezing.   ?Cardiovascular:  Negative for chest pain and palpitations.  ?Gastrointestinal:  Negative for abdominal distention, abdominal pain and constipation.  ?Genitourinary: Negative.   ?Musculoskeletal: Negative.   ?Psychiatric/Behavioral:  Negative for behavioral problems and dysphoric mood.   ? ?Objective:  ?BP (!) 147/75   Pulse (!) 54   Ht '5\' 6"'  (1.676 m)   Wt 173 lb (78.5 kg)   SpO2 98%   BMI 27.92 kg/m?  ? ? ?  03/12/2022  ?  9:31 AM 02/07/2022  ?  1:59 PM 10/04/2021  ?  2:02 PM  ?BP/Weight  ?Systolic BP 676 195 093  ?Diastolic BP 75 78 73  ?Wt. (Lbs) 173 170.8   ?BMI 27.92 kg/m2 27.57 kg/m2   ? ? ? ? ?Physical Exam ?Constitutional:   ?   Appearance: He is well-developed.  ?Cardiovascular:  ?   Rate and Rhythm: Bradycardia present.  ?   Heart sounds: Normal heart sounds. No murmur heard. ?Pulmonary:  ?   Effort: Pulmonary effort is normal.  ?   Breath sounds: Normal breath sounds. No wheezing or rales.  ?Chest:  ?   Chest wall: No tenderness.  ?Abdominal:  ?   General: Bowel sounds are normal. There is no distension.  ?   Palpations: Abdomen is soft. There is no mass.  ?   Tenderness: There is no abdominal tenderness.  ?Musculoskeletal:     ?   General: Normal range of motion.  ?   Right lower leg: No edema.  ?   Left lower leg: No edema.  ?Neurological:  ?   Mental Status: He is alert and oriented to person, place, and time.  ?Psychiatric:     ?   Mood and Affect: Mood normal.  ? ?Diabetic Foot Exam - Simple   ?Simple Foot Form ?Diabetic Foot exam was performed with the following findings: Yes 03/12/2022  9:59 AM  ?Visual Inspection ?No deformities, no ulcerations, no other skin  breakdown bilaterally: Yes ?Sensation Testing ?Intact to touch and monofilament testing bilaterally: Yes ?Pulse Check ?Posterior Tibialis and Dorsalis pulse intact bilaterally: Yes ?Comments ?  ? ? ? ?  Latest Ref Rng & Units 02/07/2022  ?  2:25 PM 09/12/2021  ?  9:43 AM 04/20/2021  ? 10:58 AM  ?CMP  ?Glucose 70 - 99 mg/dL 102   133   113    ?BUN 8 - 27 mg/dL '10   13   7    ' ?Creatinine 0.76 - 1.27 mg/dL 1.20   1.13   1.21    ?Sodium 134 - 144 mmol/L 141   143   143    ?Potassium 3.5 - 5.2 mmol/L 4.1   4.2   4.2    ?Chloride 96 - 106 mmol/L 104   105   105    ?CO2 20 - 29 mmol/L '21   24   21    ' ?Calcium 8.6 - 10.2 mg/dL 10.0   9.7   9.9    ?Total Protein 6.0 - 8.5 g/dL 7.2   7.1     ?Total Bilirubin 0.0 - 1.2 mg/dL 0.6   0.3     ?Alkaline Phos 44 - 121 IU/L 77   65     ?AST 0 - 40 IU/L 23   21     ?ALT 0 - 44 IU/L 31   30     ? ? ?Lipid Panel  ?   ?Component Value Date/Time  ? CHOL 132 02/07/2022 1425  ? TRIG 89 02/07/2022 1425  ? HDL 29 (L) 02/07/2022 1425  ? CHOLHDL 4.6 02/07/2022 1425  ? CHOLHDL 5.9 (H) 01/05/2016 1155  ? VLDL 30 01/05/2016 1155  ? [place] 86 02/07/2022 1425  ? ? ?CBC ?   ?Component Value Date/Time  ? WBC 9.7 02/07/2022 1425  ? WBC 10.1 06/16/2017 2020  ? RBC 5.54 02/07/2022 1425  ? RBC 5.06 06/16/2017 2020  ? HGB 15.1 02/07/2022 1425  ? HCT 44.9 02/07/2022 1425  ? PLT 315 02/07/2022 1425  ? MCV 81 02/07/2022 1425  ? MCH 27.3 02/07/2022 1425  ? MCH 28.5 06/16/2017 2020  ? MCHC 33.6 02/07/2022 1425  ? MCHC 35.2 06/16/2017 2020  ? RDW 13.7 02/07/2022 1425  ? LYMPHSABS 3.8 (H) 02/07/2022 1425  ? MONOABS 744 06/03/2016 1616  ? EOSABS 0.2 02/07/2022 1425  ? BASOSABS 0.1 02/07/2022 1425  ? ? ?Lab Results  ?Component Value Date  ? HGBA1C 6.6 02/07/2022  ? ? ?Assessment & Plan:  ?1. Type 2 diabetes mellitus with other specified complication, without long-term current use of insulin ([facility]) ?Controlled with A1c of 6.6 ?Continue metformin ?Up-to-date on annual eye exam ?Normal microalbumin creatinine ratio from  08/2021 ?Counseled on Diabetic diet, my plate method, 630 minutes of moderate intensity exercise/week ?Blood sugar logs with fasting goals of 80-120 mg/dl, random of less than 180 and in the event of sugars less

## 2022-04-04 ENCOUNTER — Ambulatory Visit
Admission: RE | Admit: 2022-04-04 | Discharge: 2022-04-04 | Disposition: A | Discharge status: Home / Self Care | Payer: Medicare Other | Source: Ambulatory Visit | Attending: Family Medicine | Admitting: Family Medicine

## 2022-04-04 DIAGNOSIS — F17210 Nicotine dependence, cigarettes, uncomplicated: Secondary | ICD-10-CM

## 2022-04-04 DIAGNOSIS — J8410 Pulmonary fibrosis, unspecified: Secondary | ICD-10-CM | POA: Diagnosis not present

## 2022-04-04 DIAGNOSIS — I2510 Atherosclerotic heart disease of native coronary artery without angina pectoris: Secondary | ICD-10-CM | POA: Diagnosis not present

## 2022-04-04 DIAGNOSIS — Z87891 Personal history of nicotine dependence: Secondary | ICD-10-CM | POA: Diagnosis not present

## 2022-04-04 DIAGNOSIS — J432 Centrilobular emphysema: Secondary | ICD-10-CM | POA: Diagnosis not present

## 2022-04-08 ENCOUNTER — Ambulatory Visit: Payer: Self-pay | Admitting: *Deleted

## 2022-04-08 NOTE — Telephone Encounter (Signed)
Noted  

## 2022-04-08 NOTE — Telephone Encounter (Signed)
Tiffany with [facility] calling in CT chest lung cancer screening report.  Report in Epic.   Message sent to [facility] for Dr. Margarita Rana making her aware of the results. Reason for Disposition  [1] Follow-up call to recent contact AND [2] information only call, no triage required  Answer Assessment - Initial Assessment Questions 1. REASON FOR CALL or QUESTION: "What is your reason for calling today?" or "How can I best help you?" or "What question do you have that I can help answer?"     Tiffany with [facility] calling in to make Dr. Margarita Rana aware of CT scan of chest results.   Report in Epic.  Protocols used: Information Only Call - No Triage-A-AH

## 2022-05-03 ENCOUNTER — Other Ambulatory Visit: Payer: Self-pay | Admitting: Family Medicine

## 2022-05-03 DIAGNOSIS — E1169 Type 2 diabetes mellitus with other specified complication: Secondary | ICD-10-CM

## 2022-05-03 NOTE — Telephone Encounter (Signed)
Spoke with pharmacy, states they are getting both meds ready for pt now.

## 2022-05-03 NOTE — Telephone Encounter (Signed)
Spoke with pharmacy, "Getting meds ready at this time" Requested Prescriptions  Pending Prescriptions Disp Refills  . atorvastatin (LIPITOR) 80 MG tablet [Pharmacy Med Name: ATORVASTATIN 80MG TABLETS] 90 tablet 1    Sig: TAKE 1 TABLET BY MOUTH EVERY DAY     Cardiovascular:  Antilipid - Statins Failed - 05/03/2022 12:25 PM      Failed - Lipid Panel in normal range within the last 12 months    Cholesterol, Total  Date Value Ref Range Status  02/07/2022 132 100 - 199 mg/dL Final   LDL Chol Calc (NIH)  Date Value Ref Range Status  02/07/2022 86 0 - 99 mg/dL Final   HDL  Date Value Ref Range Status  02/07/2022 29 (L) >39 mg/dL Final   Triglycerides  Date Value Ref Range Status  02/07/2022 89 0 - 149 mg/dL Final         Passed - Patient is not pregnant      Passed - Valid encounter within last 12 months    Recent Outpatient Visits          1 month ago Type 2 diabetes mellitus with other specified complication, without long-term current use of insulin ([place])   [place], [place], MD   2 months ago Type 2 diabetes mellitus with other specified complication, without long-term current use of insulin [facility])   Raymond Perkins, [place], [place]   7 months ago Hypertension associated with diabetes [facility])   [place], Annie Main L, RPH-CPP   7 months ago Type 2 diabetes mellitus with other specified complication, without long-term current use of insulin ([place])   [place], Charlane Ferretti, MD   9 months ago Hypertension associated with diabetes [facility])   [place], RPH-CPP      Future Appointments            In 4 months Charlott Rakes, MD [place]           . metFORMIN (GLUCOPHAGE-XR) 500 MG 24 hr tablet [Pharmacy Med Name: METFORMIN ER 500MG 24HR TABS] 90  tablet     Sig: TAKE 1 TABLET(500 MG) BY MOUTH DAILY WITH BREAKFAST     Endocrinology:  Diabetes - Biguanides Failed - 05/03/2022 12:25 PM      Failed - B12 Level in normal range and within 720 days    No results found for: "VITAMINB12"       Passed - Cr in normal range and within 360 days    Creat  Date Value Ref Range Status  12/04/2016 1.53 (H) 0.70 - 1.33 mg/dL Final    Comment:      For patients > or = 62 years of age: The upper reference limit for Creatinine is approximately 13% higher for people identified as African-American.      Creatinine, Ser  Date Value Ref Range Status  02/07/2022 1.20 0.76 - 1.27 mg/dL Final   Creatinine, Urine  Date Value Ref Range Status  06/03/2016 145 20 - 370 mg/dL Final         Passed - HBA1C is between 0 and 7.9 and within 180 days    HbA1c, POC (controlled diabetic range)  Date Value Ref Range Status  02/07/2022 6.6 0.0 - 7.0 % Final         Passed - eGFR in normal range  and within 360 days    GFR, Est African American  Date Value Ref Range Status  12/04/2016 58 (L) >=60 mL/min Final   GFR calc Af Amer  Date Value Ref Range Status  09/06/2020 61 >59 mL/min/1.73 Final    Comment:    **In accordance with recommendations from the NKF-ASN Task force,**   [facility] is in the process of updating its eGFR calculation to the   2021 CKD-EPI creatinine equation that estimates kidney function   without a race variable.    GFR, Est Non African American  Date Value Ref Range Status  12/04/2016 50 (L) >=60 mL/min Final   GFR calc non Af Amer  Date Value Ref Range Status  09/06/2020 53 (L) >59 mL/min/1.73 Final   eGFR  Date Value Ref Range Status  02/07/2022 69 >59 mL/min/1.73 Final         Passed - Valid encounter within last 6 months    Recent Outpatient Visits          1 month ago Type 2 diabetes mellitus with other specified complication, without long-term current use of insulin ([place])   [place], [place], MD   2 months ago Type 2 diabetes mellitus with other specified complication, without long-term current use of insulin ([place])   [place], [place], [place]   7 months ago Hypertension associated with diabetes [facility])   [place], Annie Main L, RPH-CPP   7 months ago Type 2 diabetes mellitus with other specified complication, without long-term current use of insulin ([place])   [place], Charlane Ferretti, MD   9 months ago Hypertension associated with diabetes [facility])   [place], Jarome [place], RPH-CPP      Future Appointments            In 4 months Charlott Rakes, MD [place] - CBC within normal limits and completed in the last 12 months    WBC  Date Value Ref Range Status  02/07/2022 9.7 3.4 - 10.8 x10E3/uL Final  06/16/2017 10.1 4.0 - 10.5 K/uL Final   RBC  Date Value Ref Range Status  02/07/2022 5.54 4.14 - 5.80 x10E6/uL Final  06/16/2017 5.06 4.22 - 5.81 MIL/uL Final   Hemoglobin  Date Value Ref Range Status  02/07/2022 15.1 13.0 - 17.7 g/dL Final   Hematocrit  Date Value Ref Range Status  02/07/2022 44.9 37.5 - 51.0 % Final   MCHC  Date Value Ref Range Status  02/07/2022 33.6 31.5 - 35.7 g/dL Final  06/16/2017 35.2 30.0 - 36.0 g/dL Final   [facility]  Date Value Ref Range Status  02/07/2022 27.3 26.6 - 33.0 pg Final  06/16/2017 28.5 26.0 - 34.0 pg Final   MCV  Date Value Ref Range Status  02/07/2022 81 79 - 97 fL Final   No results found for: "PLTCOUNTKUC", "LABPLAT", "POCPLA" RDW  Date Value Ref Range Status  02/07/2022 13.7 11.6 - 15.4 % Final

## 2022-06-10 ENCOUNTER — Ambulatory Visit: Payer: Medicare Other | Admitting: Family Medicine

## 2022-06-18 ENCOUNTER — Ambulatory Visit: Payer: Medicare Other | Attending: Family Medicine

## 2022-06-18 DIAGNOSIS — Z0000 Encounter for general adult medical examination without abnormal findings: Secondary | ICD-10-CM

## 2022-06-18 MED ORDER — ZOSTER VAC RECOMB ADJUVANTED 50 MCG/0.5ML IM SUSR: 0.5000 mL | Freq: Once | INTRAMUSCULAR | 1 refills | Status: AC

## 2022-06-18 NOTE — Patient Instructions (Signed)
Henry Walker , Thank you for taking time to come for your Medicare Wellness Visit. I appreciate your ongoing commitment to your health goals. Please review the following plan we discussed and let me know if I can assist you in the future.   These are the goals we discussed:  Goals   None     This is a list of the screening recommended for you and due dates:  Health Maintenance  Topic Date Due   COVID-19 Vaccine (1) Never done   Zoster (Shingles) Vaccine (1 of 2) Never done   Flu Shot  05/28/2022   Hemoglobin A1C  08/09/2022   Eye exam for diabetics  01/12/2023   Complete foot exam   03/13/2023   Colon Cancer Screening  05/18/2024   Tetanus Vaccine  06/03/2026   Hepatitis C Screening: USPSTF Recommendation to screen - Ages 18-79 yo.  Completed   HIV Screening  Completed   HPV Vaccine  Aged Out  Health Maintenance, Male Adopting a healthy lifestyle and getting preventive care are important in promoting health and wellness. Ask your health care provider about: The right schedule for you to have regular tests and exams. Things you can do on your own to prevent diseases and keep yourself healthy. What should I know about diet, weight, and exercise? Eat a healthy diet  Eat a diet that includes plenty of vegetables, fruits, low-fat dairy products, and lean protein. Do not eat a lot of foods that are high in solid fats, added sugars, or sodium. Maintain a healthy weight Body mass index (BMI) is a measurement that can be used to identify possible weight problems. It estimates body fat based on height and weight. Your health care provider can help determine your BMI and help you achieve or maintain a healthy weight. Get regular exercise Get regular exercise. This is one of the most important things you can do for your health. Most adults should: Exercise for at least 150 minutes each week. The exercise should increase your heart rate and make you sweat (moderate-intensity exercise). Do  strengthening exercises at least twice a week. This is in addition to the moderate-intensity exercise. Spend less time sitting. Even light physical activity can be beneficial. Watch cholesterol and blood lipids Have your blood tested for lipids and cholesterol at 62 years of age, then have this test every 5 years. You may need to have your cholesterol levels checked more often if: Your lipid or cholesterol levels are high. You are older than 62 years of age. You are at high risk for heart disease. What should I know about cancer screening? Many types of cancers can be detected early and may often be prevented. Depending on your health history and family history, you may need to have cancer screening at various ages. This may include screening for: Colorectal cancer. Prostate cancer. Skin cancer. Lung cancer. What should I know about heart disease, diabetes, and high blood pressure? Blood pressure and heart disease High blood pressure causes heart disease and increases the risk of stroke. This is more likely to develop in people who have high blood pressure readings or are overweight. Talk with your health care provider about your target blood pressure readings. Have your blood pressure checked: Every 3-5 years if you are 40-1 years of age. Every year if you are 55 years old or older. If you are between the ages of 19 and 64 and are a current or former smoker, ask your health care provider if you should  have a one-time screening for abdominal aortic aneurysm (AAA). Diabetes Have regular diabetes screenings. This checks your fasting blood sugar level. Have the screening done: Once every three years after age 60 if you are at a normal weight and have a low risk for diabetes. More often and at a younger age if you are overweight or have a high risk for diabetes. What should I know about preventing infection? Hepatitis B If you have a higher risk for hepatitis B, you should be screened for  this virus. Talk with your health care provider to find out if you are at risk for hepatitis B infection. Hepatitis C Blood testing is recommended for: Everyone born from [birth year] through 1966. Anyone with known risk factors for hepatitis C. Sexually transmitted infections (STIs) You should be screened each year for STIs, including gonorrhea and chlamydia, if: You are sexually active and are younger than 62 years of age. You are older than 62 years of age and your health care provider tells you that you are at risk for this type of infection. Your sexual activity has changed since you were last screened, and you are at increased risk for chlamydia or gonorrhea. Ask your health care provider if you are at risk. Ask your health care provider about whether you are at high risk for HIV. Your health care provider may recommend a prescription medicine to help prevent HIV infection. If you choose to take medicine to prevent HIV, you should first get tested for HIV. You should then be tested every 3 months for as long as you are taking the medicine. Follow these instructions at home: Alcohol use Do not drink alcohol if your health care provider tells you not to drink. If you drink alcohol: Limit how much you have to 0-2 drinks a day. Know how much alcohol is in your drink. In the [place] one drink equals one 12 oz bottle of beer (355 mL), one 5 oz glass of wine (148 mL), or one 1 oz glass of hard liquor (44 mL). Lifestyle Do not use any products that contain nicotine or tobacco. These products include cigarettes, chewing tobacco, and vaping devices, such as e-cigarettes. If you need help quitting, ask your health care provider. Do not use street drugs. Do not share needles. Ask your health care provider for help if you need support or information about quitting drugs. General instructions Schedule regular health, dental, and eye exams. Stay current with your vaccines. Tell your health care provider  if: You often feel depressed. You have ever been abused or do not feel safe at home. Summary Adopting a healthy lifestyle and getting preventive care are important in promoting health and wellness. Follow your health care provider's instructions about healthy diet, exercising, and getting tested or screened for diseases. Follow your health care provider's instructions on monitoring your cholesterol and blood pressure. This information is not intended to replace advice given to you by your health care provider. Make sure you discuss any questions you have with your health care provider. Document Revised: 03/05/2021 Document Reviewed: 03/05/2021 Elsevier Patient Education  [place].

## 2022-06-18 NOTE — Progress Notes (Unsigned)
 Subjective:   Henry Walker is a 61 y.o. male who presents for an Initial Medicare Annual Wellness Visit.  Review of Systems    *** Cardiac Risk Factors include: none     Objective:    There were no vitals filed for this visit. There is no height or weight on file to calculate BMI.     06/18/2022    2:51 PM 06/19/2021    1:20 PM 03/09/2021   12:01 PM 02/24/2021    1:11 PM 02/07/2020    9:13 AM 06/16/2017    8:20 PM 05/06/2017    2:51 PM  Advanced Directives  Does Patient Have a Medical Advance Directive? No No No No No No No  Would patient like information on creating a medical advance directive? Yes (ED - Information included in AVS)  No - Patient declined    Yes (ED - Information included in AVS)    Current Medications (verified) Outpatient Encounter Medications as of 06/18/2022  Medication Sig   allopurinol (ZYLOPRIM) 100 MG tablet Take 1 tablet (100 mg total) by mouth daily.   amLODipine (NORVASC) 10 MG tablet Take 1 tablet (10 mg total) by mouth daily.   atorvastatin (LIPITOR) 80 MG tablet TAKE 1 TABLET BY MOUTH EVERY DAY   Blood Glucose Monitoring Suppl (ONE TOUCH ULTRA 2) w/Device KIT Use as directed 3 times daily before meals.   carvedilol (COREG) 6.25 MG tablet Take 1 tablet (6.25 mg total) by mouth 2 (two) times daily with a meal.   clotrimazole (LOTRIMIN) 1 % cream Apply 1 application topically 2 (two) times daily.   glucose blood (ONETOUCH ULTRA) test strip Use as instructed 3 times daily   Lancet Devices (ACCU-CHEK SOFTCLIX) lancets Use as instructed   Lancets (ONETOUCH ULTRASOFT) lancets Use as instructed 3 times daily   lisinopril (ZESTRIL) 40 MG tablet Take 1 tablet (40 mg total) by mouth daily.   metFORMIN (GLUCOPHAGE-XR) 500 MG 24 hr tablet TAKE 1 TABLET(500 MG) BY MOUTH DAILY WITH BREAKFAST   ofloxacin (FLOXIN) 0.3 % OTIC solution Place 10 drops daily into the left ear. For 7 days.   pantoprazole (PROTONIX) 40 MG tablet Take 1 tablet (40 mg total) by  mouth daily.   aspirin EC 81 MG tablet Take 1 tablet (81 mg total) by mouth daily. (Patient not taking: Reported on 06/18/2022)   [DISCONTINUED] tadalafil (CIALIS) 20 MG tablet Take 0.5-1 tablets (10-20 mg total) by mouth every other day as needed for erectile dysfunction. (Patient not taking: Reported on 09/19/2014)   Facility-Administered Encounter Medications as of 06/18/2022  Medication   0.9 %  sodium chloride infusion   carbamide peroxide (DEBROX) 6.5 % otic solution 5 drop    Allergies (verified) Patient has no known allergies.   History: Past Medical History:  Diagnosis Date   Diabetes mellitus without complication (HCC)    GERD (gastroesophageal reflux disease)    Gout    Hx of adenomatous colonic polyps 10/06/2014   Hyperlipidemia    Hypertension    Past Surgical History:  Procedure Laterality Date   BACK SURGERY     COLONOSCOPY     Family History  Problem Relation Age of Onset   Colon cancer Neg Hx    Esophageal cancer Neg Hx    Rectal cancer Neg Hx    Stomach cancer Neg Hx    Colon polyps Neg Hx    Social History   Socioeconomic History   Marital status: Divorced    Spouse name: Not   on file   Number of children: Not on file   Years of education: Not on file   Highest education level: Not on file  Occupational History   Not on file  Tobacco Use   Smoking status: Every Day    Packs/day: 0.50    Types: Cigarettes   Smokeless tobacco: Never  Vaping Use   Vaping Use: Never used  Substance and Sexual Activity   Alcohol use: No    Alcohol/week: 0.0 standard drinks of alcohol   Drug use: No   Sexual activity: Not Currently  Other Topics Concern   Not on file  Social History Narrative   Retired Location manager (gas, IT trainer, cable)   Social Determinants of Health   Financial Resource Strain: Low Risk  (06/18/2022)   Overall Financial Resource Strain (CARDIA)    Difficulty of Paying Living Expenses: Not very hard  Food Insecurity: No Food  Insecurity (06/18/2022)   Hunger Vital Sign    Worried About Running Out of Food in the Last Year: Never true    Ran Out of Food in the Last Year: Never true  Transportation Needs: No Transportation Needs (06/18/2022)   PRAPARE - Hydrologist (Medical): No    Lack of Transportation (Non-Medical): No  Physical Activity: Insufficiently Active (06/18/2022)   Exercise Vital Sign    Days of Exercise per Week: 7 days    Minutes of Exercise per Session: 20 min  Stress: No Stress Concern Present (06/18/2022)   [place]    Feeling of Stress : Not at all  Social Connections: Socially Isolated (06/18/2022)   Social Connection and Isolation Panel [NHANES]    Frequency of Communication with Friends and Family: More than three times a week    Frequency of Social Gatherings with Friends and Family: Three times a week    Attends Religious Services: Never    Active Member of Clubs or Organizations: No    Attends Music therapist: Never    Marital Status: Divorced    Tobacco Counseling Ready to quit: Not Answered Counseling given: Not Answered   Clinical Intake:  Pre-visit preparation completed: No  Pain : No/denies pain     Nutritional Risks: None Diabetes: Yes CBG done?: No Did pt. bring in CBG monitor from home?: No  How often do you need to have someone help you when you read instructions, pamphlets, or other written materials from your doctor or pharmacy?: 1 - Never  Diabetic?Yes  Interpreter Needed?: No      Activities of Daily Living    06/18/2022    2:51 PM  In your present state of health, do you have any difficulty performing the following activities:  Hearing? 0  Vision? 0  Difficulty concentrating or making decisions? 0  Walking or climbing stairs? 0  Dressing or bathing? 0  Doing errands, shopping? 0  Preparing Food and eating ? N  Using the Toilet? N  In  the past six months, have you accidently leaked urine? N  Do you have problems with loss of bowel control? N  Managing your Medications? N  Managing your Finances? N  Housekeeping or managing your Housekeeping? N    Patient Care Team: Charlott Rakes, MD as PCP - General (Family Medicine)  Indicate any recent Medical Services you may have received from other than [facility] providers in the past year (date may be approximate).     Assessment:  This is a routine wellness examination for Henry Walker.  Hearing/Vision screen No results found.  Dietary issues and exercise activities discussed: Current Exercise Habits: Home exercise routine, Type of exercise: walking, Time (Minutes): 20, Frequency (Times/Week): 7, Weekly Exercise (Minutes/Week): 140, Intensity: Mild, Exercise limited by: None identified   Goals Addressed   None    Depression Screen    06/18/2022    2:51 PM 03/12/2022    9:40 AM 02/07/2022    2:04 PM 09/12/2021    9:24 AM 06/18/2021    9:19 AM 03/12/2021    3:02 PM 03/12/2021    2:53 PM  PHQ 2/9 Scores  PHQ - 2 Score 0 0 0 0 0 0 0  PHQ- 9 Score  0 2 0 6 0 0    Fall Risk    06/18/2022    2:51 PM 03/12/2022    9:31 AM 02/07/2022    2:04 PM 09/12/2021    9:07 AM 03/12/2021    2:51 PM  Fall Risk   Falls in the past year? 0 0 0 0 0  Number falls in past yr: 0 0 0  0  Injury with Fall? 0 0 0  0  Risk for fall due to : No Fall Risks  No Fall Risks    Follow up Education provided        FALL RISK PREVENTION PERTAINING TO THE HOME:  Any stairs in or around the home? Yes  If so, are there any without handrails? Yes  Home free of loose throw rugs in walkways, pet beds, electrical cords, etc? Yes  Adequate lighting in your home to reduce risk of falls? Yes   ASSISTIVE DEVICES UTILIZED TO PREVENT FALLS:  Life alert? No  Use of a cane, walker or w/c? No  Grab bars in the bathroom? No  Shower chair or bench in shower? No  Elevated toilet seat or a handicapped toilet? No    TIMED UP AND GO:  Was the test performed? No .  Length of time to ambulate 10 feet: 0  sec.   Gait slow and steady without use of assistive device  Cognitive Function:    06/18/2022    2:53 PM  MMSE - Mini Mental State Exam  Orientation to time 5  Orientation to Place 5  Registration 3  Attention/ Calculation 5  Recall 3  Language- name 2 objects 2  Language- repeat 1  Language- follow 3 step command 3  Language- read & follow direction 1  Write a sentence 1  Copy design 1  Total score 30        06/18/2022    2:53 PM  6CIT Screen  What Year? 0 points  What month? 0 points  What time? 0 points  Count back from 20 0 points  Months in reverse 0 points  Repeat phrase 0 points  Total Score 0 points    Immunizations Immunization History  Administered Date(s) Administered   Influenza,inj,Quad PF,6+ Mos 10/12/2013, 06/03/2016, 06/19/2018, 07/07/2019, 09/06/2020, 09/12/2021   PNEUMOCOCCAL CONJUGATE-20 09/12/2021   PPD Test 07/20/2018   Pneumococcal Polysaccharide-23 05/29/2015   Tdap 06/03/2016    TDAP status: Up to date  Flu Vaccine status: Due, Education has been provided regarding the importance of this vaccine. Advised may receive this vaccine at local pharmacy or Health Dept. Aware to provide a copy of the vaccination record if obtained from local pharmacy or Health Dept. Verbalized acceptance and understanding.  Pneumococcal vaccine status: Up to date  Covid-19   vaccine status: Completed vaccines  Qualifies for Shingles Vaccine? Yes   Zostavax completed No   Shingrix Completed?: No.    Education has been provided regarding the importance of this vaccine. Patient has been advised to call insurance company to determine out of pocket expense if they have not yet received this vaccine. Advised may also receive vaccine at local pharmacy or Health Dept. Verbalized acceptance and understanding.  Screening Tests Health Maintenance  Topic Date Due   COVID-19  Vaccine (1) Never done   Zoster Vaccines- Shingrix (1 of 2) Never done   INFLUENZA VACCINE  05/28/2022   HEMOGLOBIN A1C  08/09/2022   OPHTHALMOLOGY EXAM  01/12/2023   FOOT EXAM  03/13/2023   COLONOSCOPY (Pts 45-57yr Insurance coverage will need to be confirmed)  05/18/2024   TETANUS/TDAP  06/03/2026   Hepatitis C Screening  Completed   HIV Screening  Completed   HPV VACCINES  Aged Out    Health Maintenance  Health Maintenance Due  Topic Date Due   COVID-19 Vaccine (1) Never done   Zoster Vaccines- Shingrix (1 of 2) Never done   INFLUENZA VACCINE  05/28/2022    Colorectal cancer screening: Type of screening: Colonoscopy. Completed ***. Repeat every *** years  Lung Cancer Screening: (Low Dose CT Chest recommended if Age 62-80years, 30 pack-year currently smoking OR have quit w/in 15years.) does qualify.   Lung Cancer Screening Referral: Completed on 04/04/2022  Additional Screening:  Hepatitis C Screening: does qualify; Completed 06/03/2016   Vision Screening: Recommended annual ophthalmology exams for early detection of glaucoma and other disorders of the eye. Is the patient up to date with their annual eye exam?  Yes  Who is the provider or what is the name of the office in which the patient attends annual eye exams? [facility] If pt is not established with a provider, would they like to be referred to a provider to establish care? No .   Dental Screening: Recommended annual dental exams for proper oral hygiene  Community Resource Referral / Chronic Care Management: CRR required this visit?  No   CCM required this visit?  No      Plan:     I have personally reviewed and noted the following in the patient's chart:   Medical and social history Use of alcohol, tobacco or illicit drugs  Current medications and supplements including opioid prescriptions. Patient is not currently taking opioid prescriptions. Functional ability and status Nutritional status Physical  activity Advanced directives List of other physicians Hospitalizations, surgeries, and ER visits in previous 12 months Vitals Screenings to include cognitive, depression, and falls Referrals and appointments  In addition, I have reviewed and discussed with patient certain preventive protocols, quality metrics, and best practice recommendations. A written personalized care plan for preventive services as well as general preventive health recommendations were provided to patient.     AGomez [place]  06/18/2022   Nurse Notes: ***

## 2022-07-30 ENCOUNTER — Telehealth: Payer: Self-pay | Admitting: *Deleted

## 2022-07-30 NOTE — Telephone Encounter (Signed)
'  Merrilee Seashore' from [facility] calling. Pt states he is taking Atorvastatin 80 mg AND a '40mg'$  tab as well. Calling for clarification. Reviewed current med profile and OV notes.  Advised last order was for '80mg'$  once daily.   States he will inform pt.

## 2022-09-17 ENCOUNTER — Ambulatory Visit: Payer: Medicare Other | Attending: Family Medicine | Admitting: Family Medicine

## 2022-09-17 ENCOUNTER — Encounter: Payer: Self-pay | Admitting: Family Medicine

## 2022-09-17 VITALS — BP 145/60 | HR 59 | Temp 98.1°F | Ht 67.0 in | Wt 170.4 lb

## 2022-09-17 DIAGNOSIS — K219 Gastro-esophageal reflux disease without esophagitis: Secondary | ICD-10-CM

## 2022-09-17 DIAGNOSIS — I152 Hypertension secondary to endocrine disorders: Secondary | ICD-10-CM | POA: Diagnosis not present

## 2022-09-17 DIAGNOSIS — M1A0710 Idiopathic chronic gout, right ankle and foot, without tophus (tophi): Secondary | ICD-10-CM

## 2022-09-17 DIAGNOSIS — E1159 Type 2 diabetes mellitus with other circulatory complications: Secondary | ICD-10-CM | POA: Diagnosis not present

## 2022-09-17 DIAGNOSIS — E1169 Type 2 diabetes mellitus with other specified complication: Secondary | ICD-10-CM

## 2022-09-17 DIAGNOSIS — E785 Hyperlipidemia, unspecified: Secondary | ICD-10-CM | POA: Diagnosis not present

## 2022-09-17 DIAGNOSIS — R918 Other nonspecific abnormal finding of lung field: Secondary | ICD-10-CM | POA: Diagnosis not present

## 2022-09-17 LAB — POCT GLYCOSYLATED HEMOGLOBIN (HGB A1C): HbA1c, POC (controlled diabetic range): 6.7 % (ref 0.0–7.0)

## 2022-09-17 MED ORDER — VALSARTAN-HYDROCHLOROTHIAZIDE 320-25 MG PO TABS: 1.0000 | ORAL_TABLET | Freq: Every day | ORAL | 1 refills | Status: DC

## 2022-09-17 MED ORDER — PANTOPRAZOLE SODIUM 40 MG PO TBEC: 40.0000 mg | DELAYED_RELEASE_TABLET | Freq: Every day | ORAL | 1 refills | Status: DC

## 2022-09-17 MED ORDER — ATORVASTATIN CALCIUM 80 MG PO TABS: ORAL_TABLET | ORAL | 1 refills | Status: DC

## 2022-09-17 MED ORDER — LISINOPRIL 40 MG PO TABS: 40.0000 mg | ORAL_TABLET | Freq: Every day | ORAL | 1 refills | Status: DC

## 2022-09-17 MED ORDER — CARVEDILOL 6.25 MG PO TABS: 6.2500 mg | ORAL_TABLET | Freq: Two times a day (BID) | ORAL | 1 refills | Status: DC

## 2022-09-17 MED ORDER — METFORMIN HCL ER 500 MG PO TB24: ORAL_TABLET | ORAL | 1 refills | Status: DC

## 2022-09-17 MED ORDER — AMLODIPINE BESYLATE 10 MG PO TABS: 10.0000 mg | ORAL_TABLET | Freq: Every day | ORAL | 1 refills | Status: DC

## 2022-09-17 MED ORDER — ALLOPURINOL 100 MG PO TABS: 100.0000 mg | ORAL_TABLET | Freq: Every day | ORAL | 1 refills | Status: DC

## 2022-09-17 NOTE — Patient Instructions (Signed)

## 2022-09-17 NOTE — Progress Notes (Signed)
Subjective:  Patient ID: Henry Walker, male    DOB: 1961/07/16  Age: 61 y.o. MRN: [id number]  CC: Diabetes   HPI Henry Walker is a 61 y.o. year old male with a history of type 2 diabetes mellitus (A1c 6.7), hypertension, hyperlipidemia, GERD, gout, and tobacco abuse (0.5 ppd since he was 48) who presents for follow-up visit   Interval History: He is due for follow-up chest CT due to abnormal findings of CT chest lung cancer screen from 03/2022 and he continues to smoke, is not ready to quit. CT chest lung cancer screen from 03/2022: IMPRESSION: 1. Lung-RADS 3, probably benign findings. Short-term follow-up in 6 months is recommended with repeat low-dose chest CT without contrast (please use the following order, "CT CHEST LCS NODULE FOLLOW-UP W/O CM"). 2. Aortic Atherosclerosis (ICD10-I70.0) and Emphysema (ICD10-J43.9).     He has no gout flares and is adherent with his statin.  Doing well on metformin and he has no hypoglycemia.  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  He is up-to-date on his annual eye exams. Denies additional concerns today.  Past Medical History:  Diagnosis Date   Diabetes mellitus without complication (HCC)    GERD (gastroesophageal reflux disease)    Gout    Hx of adenomatous colonic polyps 10/06/2014   Hyperlipidemia    Hypertension     Past Surgical History:  Procedure Laterality Date   BACK SURGERY     COLONOSCOPY      Family History  Problem Relation Age of Onset   Colon cancer Neg Hx    Esophageal cancer Neg Hx    Rectal cancer Neg Hx    Stomach cancer Neg Hx    Colon polyps Neg Hx     Social History   Socioeconomic History   Marital status: Divorced    Spouse name: Not on file   Number of children: Not on file   Years of education: Not on file   Highest education level: Not on file  Occupational History   Not on file  Tobacco Use   Smoking status: Every Day    Packs/day: 0.50     Types: Cigarettes   Smokeless tobacco: Never  Vaping Use   Vaping Use: Never used  Substance and Sexual Activity   Alcohol use: No    Alcohol/week: 0.0 standard drinks of alcohol   Drug use: No   Sexual activity: Not Currently  Other Topics Concern   Not on file  Social History Narrative   Retired Location manager (gas, IT trainer, cable)   Social Determinants of Health   Financial Resource Strain: Low Risk  (06/18/2022)   Overall Financial Resource Strain (CARDIA)    Difficulty of Paying Living Expenses: Not very hard  Food Insecurity: No Food Insecurity (06/18/2022)   Hunger Vital Sign    Worried About Running Out of Food in the Last Year: Never true    Ran Out of Food in the Last Year: Never true  Transportation Needs: No Transportation Needs (06/18/2022)   PRAPARE - Transportation    Lack of Transportation (Medical): No    Lack of Transportation (Non-Medical): No  Physical Activity: Insufficiently Active (06/18/2022)   Exercise Vital Sign    Days of Exercise per Week: 7 days    Minutes of Exercise per Session: 20 min  Stress: No Stress Concern Present (06/18/2022)   [place]    Feeling of Stress : Not  at all  Social Connections: Socially Isolated (06/18/2022)   Social Connection and Isolation Panel [NHANES]    Frequency of Communication with Friends and Family: More than three times a week    Frequency of Social Gatherings with Friends and Family: Three times a week    Attends Religious Services: Never    Active Member of Clubs or Organizations: No    Attends Archivist Meetings: Never    Marital Status: Divorced    No Known Allergies  Outpatient Medications Prior to Visit  Medication Sig Dispense Refill   aspirin EC 81 MG tablet Take 1 tablet (81 mg total) by mouth daily. 30 tablet 11   Blood Glucose Monitoring Suppl (ONE TOUCH ULTRA 2) w/Device KIT Use as directed 3 times daily before  meals. 1 kit 0   clotrimazole (LOTRIMIN) 1 % cream Apply 1 application topically 2 (two) times daily. 30 g 0   glucose blood (ONETOUCH ULTRA) test strip Use as instructed 3 times daily 100 each 12   Lancet Devices (ACCU-CHEK SOFTCLIX) lancets Use as instructed 1 each 0   Lancets (ONETOUCH ULTRASOFT) lancets Use as instructed 3 times daily 100 each 12   ofloxacin (FLOXIN) 0.3 % OTIC solution Place 10 drops daily into the left ear. For 7 days. 5 mL 0   allopurinol (ZYLOPRIM) 100 MG tablet Take 1 tablet (100 mg total) by mouth daily. 90 tablet 1   amLODipine (NORVASC) 10 MG tablet Take 1 tablet (10 mg total) by mouth daily. 90 tablet 1   atorvastatin (LIPITOR) 80 MG tablet TAKE 1 TABLET BY MOUTH EVERY DAY 90 tablet 1   carvedilol (COREG) 6.25 MG tablet Take 1 tablet (6.25 mg total) by mouth 2 (two) times daily with a meal. 180 tablet 1   lisinopril (ZESTRIL) 40 MG tablet Take 1 tablet (40 mg total) by mouth daily. 90 tablet 1   metFORMIN (GLUCOPHAGE-XR) 500 MG 24 hr tablet TAKE 1 TABLET(500 MG) BY MOUTH DAILY WITH BREAKFAST 90 tablet 0   pantoprazole (PROTONIX) 40 MG tablet Take 1 tablet (40 mg total) by mouth daily. 90 tablet 1   Facility-Administered Medications Prior to Visit  Medication Dose Route Frequency Provider Last Rate Last Admin   0.9 %  sodium chloride infusion  500 mL Intravenous Once Gatha Mayer, MD       carbamide peroxide (DEBROX) 6.5 % otic solution 5 drop  5 drop Left EAR Once Langeland, Dawn T, MD         ROS Review of Systems  Constitutional:  Negative for activity change and appetite change.  HENT:  Negative for sinus pressure and sore throat.   Respiratory:  Negative for chest tightness, shortness of breath and wheezing.   Cardiovascular:  Negative for chest pain and palpitations.  Gastrointestinal:  Negative for abdominal distention, abdominal pain and constipation.  Genitourinary: Negative.   Musculoskeletal: Negative.   Neurological:  Positive for numbness.   Psychiatric/Behavioral:  Negative for behavioral problems and dysphoric mood.     Objective:  BP (!) 145/60   Pulse (!) 59   Temp 98.1 F (36.7 C) (Oral)   Ht _0  (1.702 m)   Wt 170 lb 6.4 oz (77.3 kg)   SpO2 97%   BMI 26.69 kg/m      09/17/2022    9:37 AM 09/17/2022    9:24 AM 03/12/2022    9:31 AM  BP/Weight  Systolic BP 010 071 219  Diastolic BP 60 80 75  Wt. (Lbs)  170.4 173  BMI  26.69 kg/m2 27.92 kg/m2      Physical Exam Constitutional:      Appearance: He is well-developed.  Cardiovascular:     Rate and Rhythm: Normal rate.     Heart sounds: Normal heart sounds. No murmur heard. Pulmonary:     Effort: Pulmonary effort is normal.     Breath sounds: Normal breath sounds. No wheezing or rales.  Chest:     Chest wall: No tenderness.  Abdominal:     General: Bowel sounds are normal. There is no distension.     Palpations: Abdomen is soft. There is no mass.     Tenderness: There is no abdominal tenderness.  Musculoskeletal:        General: Normal range of motion.     Right lower leg: No edema.     Left lower leg: No edema.  Neurological:     Mental Status: He is alert and oriented to person, place, and time.  Psychiatric:        Mood and Affect: Mood normal.        Latest Ref Rng & Units 02/07/2022    2:25 PM 09/12/2021    9:43 AM 04/20/2021   10:58 AM  CMP  Glucose 70 - 99 mg/dL 102  133  113   BUN 8 - 27 mg/dL _0 Creatinine 0.76 - 1.27 mg/dL 1.20  1.13  1.21   Sodium 134 - 144 mmol/L 141  143  143   Potassium 3.5 - 5.2 mmol/L 4.1  4.2  4.2   Chloride 96 - 106 mmol/L 104  105  105   CO2 20 - 29 mmol/L _1 Calcium 8.6 - 10.2 mg/dL 10.0  9.7  9.9   Total Protein 6.0 - 8.5 g/dL 7.2  7.1    Total Bilirubin 0.0 - 1.2 mg/dL 0.6  0.3    Alkaline Phos 44 - 121 IU/L 77  65    AST 0 - 40 IU/L 23  21    ALT 0 - 44 IU/L 31  30      Lipid Panel     Component Value Date/Time   CHOL 132 02/07/2022 1425   TRIG 89 02/07/2022 1425    HDL 29 (L) 02/07/2022 1425   CHOLHDL 4.6 02/07/2022 1425   CHOLHDL 5.9 (H) 01/05/2016 1155   VLDL 30 01/05/2016 1155   LDLCALC 86 02/07/2022 1425    CBC    Component Value Date/Time   WBC 9.7 02/07/2022 1425   WBC 10.1 06/16/2017 2020   RBC 5.54 02/07/2022 1425   RBC 5.06 06/16/2017 2020   HGB 15.1 02/07/2022 1425   HCT 44.9 02/07/2022 1425   PLT 315 02/07/2022 1425   MCV 81 02/07/2022 1425   MCH 27.3 02/07/2022 1425   MCH 28.5 06/16/2017 2020   MCHC 33.6 02/07/2022 1425   MCHC 35.2 06/16/2017 2020   RDW 13.7 02/07/2022 1425   LYMPHSABS 3.8 (H) 02/07/2022 1425   MONOABS 744 06/03/2016 1616   EOSABS 0.2 02/07/2022 1425   BASOSABS 0.1 02/07/2022 1425    Lab Results  Component Value Date   HGBA1C 6.7 09/17/2022    Assessment & Plan:  1. Type 2 diabetes mellitus with other specified complication, without long-term current use of insulin (HCC) Controlled with A1c of 6.7 Continue metformin Counseled on Diabetic diet, my plate method, 315 minutes of moderate intensity exercise/week Blood sugar logs with fasting  goals of 80-120 mg/dl, random of less than 180 and in the event of sugars less than 60 mg/dl or greater than 400 mg/dl encouraged to notify the clinic. Advised on the need for annual eye exams, annual foot exams, Pneumonia vaccine. - POCT glycosylated hemoglobin (Hb A1C) - Microalbumin/Creatinine Ratio, Urine - CMP14+EGFR - metFORMIN (GLUCOPHAGE-XR) 500 MG 24 hr tablet; TAKE 1 TABLET(500 MG) BY MOUTH DAILY WITH BREAKFAST  Dispense: 90 tablet; Refill: 1  2. Lung nodules Incidental finding on low-dose chest CT lung cancer screen from 6 months ago Follow-up imaging is due - CT CHEST NODULE FOLLOW UP LOW DOSE W/O; Future  3. Chronic idiopathic gout involving toe of right foot without tophus Stable - allopurinol (ZYLOPRIM) 100 MG tablet; Take 1 tablet (100 mg total) by mouth daily.  Dispense: 90 tablet; Refill: 1  4. Hypertension associated with diabetes  ([place]) Slightly above goal We will switch from lisinopril to Diovan/HCTZ Counseled on blood pressure goal of less than 130/80, low-sodium, DASH diet, medication compliance, 150 minutes of moderate intensity exercise per week. Discussed medication compliance, adverse effects. - amLODipine (NORVASC) 10 MG tablet; Take 1 tablet (10 mg total) by mouth daily.  Dispense: 90 tablet; Refill: 1 - carvedilol (COREG) 6.25 MG tablet; Take 1 tablet (6.25 mg total) by mouth 2 (two) times daily with a meal.  Dispense: 180 tablet; Refill: 1 - valsartan-hydrochlorothiazide (DIOVAN-HCT) 320-25 MG tablet; Take 1 tablet by mouth daily.  Dispense: 90 tablet; Refill: 1  5. Hyperlipidemia associated with type 2 diabetes mellitus (HCC) Controlled Low-cholesterol diet - atorvastatin (LIPITOR) 80 MG tablet; TAKE 1 TABLET BY MOUTH EVERY DAY  Dispense: 90 tablet; Refill: 1  6. Gastroesophageal reflux disease without esophagitis Stable - pantoprazole (PROTONIX) 40 MG tablet; Take 1 tablet (40 mg total) by mouth daily.  Dispense: 90 tablet; Refill: 1   Meds ordered this encounter  Medications   allopurinol (ZYLOPRIM) 100 MG tablet    Sig: Take 1 tablet (100 mg total) by mouth daily.    Dispense:  90 tablet    Refill:  1    Start on 03/19/21   amLODipine (NORVASC) 10 MG tablet    Sig: Take 1 tablet (10 mg total) by mouth daily.    Dispense:  90 tablet    Refill:  1   atorvastatin (LIPITOR) 80 MG tablet    Sig: TAKE 1 TABLET BY MOUTH EVERY DAY    Dispense:  90 tablet    Refill:  1   carvedilol (COREG) 6.25 MG tablet    Sig: Take 1 tablet (6.25 mg total) by mouth 2 (two) times daily with a meal.    Dispense:  180 tablet    Refill:  1   DISCONTD: lisinopril (ZESTRIL) 40 MG tablet    Sig: Take 1 tablet (40 mg total) by mouth daily.    Dispense:  90 tablet    Refill:  1   metFORMIN (GLUCOPHAGE-XR) 500 MG 24 hr tablet    Sig: TAKE 1 TABLET(500 MG) BY MOUTH DAILY WITH BREAKFAST    Dispense:  90 tablet     Refill:  1   pantoprazole (PROTONIX) 40 MG tablet    Sig: Take 1 tablet (40 mg total) by mouth daily.    Dispense:  90 tablet    Refill:  1   valsartan-hydrochlorothiazide (DIOVAN-HCT) 320-25 MG tablet    Sig: Take 1 tablet by mouth daily.    Dispense:  90 tablet    Refill:  1  Discontinue lisinopril    Follow-up: Return in about 6 months (around 03/18/2023).       Charlott Rakes, MD, FAAFP. [facility] [place], [place]   09/17/2022, 9:56 AM

## 2022-09-18 LAB — MICROALBUMIN / CREATININE URINE RATIO
Creatinine, Urine: 100.3 mg/dL
Microalb/Creat Ratio: 8 mg/g creat (ref 0–29)
Microalbumin, Urine: 7.6 ug/mL

## 2022-09-18 LAB — CMP14+EGFR
ALT: 26 IU/L (ref 0–44)
AST: 23 IU/L (ref 0–40)
Albumin/Globulin Ratio: 1.6 (ref 1.2–2.2)
Albumin: 4.4 g/dL (ref 3.9–4.9)
Alkaline Phosphatase: 88 IU/L (ref 44–121)
BUN/Creatinine Ratio: 10 (ref 10–24)
BUN: 10 mg/dL (ref 8–27)
Bilirubin Total: 0.5 mg/dL (ref 0.0–1.2)
CO2: 23 mmol/L (ref 20–29)
Calcium: 9.3 mg/dL (ref 8.6–10.2)
Chloride: 102 mmol/L (ref 96–106)
Creatinine, Ser: 1.05 mg/dL (ref 0.76–1.27)
Globulin, Total: 2.7 g/dL (ref 1.5–4.5)
Glucose: 115 mg/dL — ABNORMAL HIGH (ref 70–99)
Potassium: 3.9 mmol/L (ref 3.5–5.2)
Sodium: 143 mmol/L (ref 134–144)
Total Protein: 7.1 g/dL (ref 6.0–8.5)
eGFR: 80 mL/min/{1.73_m2} (ref >59)

## 2022-10-23 ENCOUNTER — Ambulatory Visit
Admission: RE | Admit: 2022-10-23 | Discharge: 2022-10-23 | Disposition: A | Discharge status: Home / Self Care | Payer: Medicare Other | Source: Ambulatory Visit | Attending: Family Medicine | Admitting: Family Medicine

## 2022-10-23 DIAGNOSIS — I700 Atherosclerosis of aorta: Secondary | ICD-10-CM | POA: Diagnosis not present

## 2022-10-23 DIAGNOSIS — J439 Emphysema, unspecified: Secondary | ICD-10-CM | POA: Diagnosis not present

## 2022-10-23 DIAGNOSIS — R918 Other nonspecific abnormal finding of lung field: Secondary | ICD-10-CM | POA: Diagnosis not present

## 2022-10-23 DIAGNOSIS — R911 Solitary pulmonary nodule: Secondary | ICD-10-CM | POA: Diagnosis not present

## 2023-01-06 ENCOUNTER — Encounter: Payer: Self-pay | Admitting: Physician Assistant

## 2023-01-06 ENCOUNTER — Telehealth: Payer: Medicare Other | Admitting: Physician Assistant

## 2023-01-06 DIAGNOSIS — F17210 Nicotine dependence, cigarettes, uncomplicated: Secondary | ICD-10-CM | POA: Diagnosis not present

## 2023-01-06 DIAGNOSIS — J302 Other seasonal allergic rhinitis: Secondary | ICD-10-CM

## 2023-01-06 DIAGNOSIS — J0110 Acute frontal sinusitis, unspecified: Secondary | ICD-10-CM | POA: Diagnosis not present

## 2023-01-06 MED ORDER — AMOXICILLIN-POT CLAVULANATE 875-125 MG PO TABS: 1.0000 | ORAL_TABLET | Freq: Two times a day (BID) | ORAL | 0 refills | Status: DC

## 2023-01-06 MED ORDER — CETIRIZINE HCL 10 MG PO TABS: 10.0000 mg | ORAL_TABLET | Freq: Every day | ORAL | 11 refills | Status: AC

## 2023-01-06 MED ORDER — FLUTICASONE PROPIONATE 50 MCG/ACT NA SUSP: 2.0000 | Freq: Every day | NASAL | 6 refills | Status: DC

## 2023-01-06 NOTE — Patient Instructions (Signed)
[employer], thank you for joining [employer], PA-C for today's virtual visit.  While this provider is not your primary care provider (PCP), if your PCP is located in our provider database this encounter information will be shared with them immediately following your visit.   [facility] account gives you access to today's visit and all your visits, tests, and labs performed at [facility] " click here if you don't have a [place] account or go to [email or web address]  Consent: (Patient) Henry Walker provided verbal consent for this virtual visit at the beginning of the encounter.  Current Medications:  Current Outpatient Medications:    amoxicillin-clavulanate (AUGMENTIN) 875-125 MG tablet, Take 1 tablet by mouth 2 (two) times daily., Disp: 20 tablet, Rfl: 0   cetirizine (ZYRTEC ALLERGY) 10 MG tablet, Take 1 tablet (10 mg total) by mouth daily., Disp: 30 tablet, Rfl: 11   fluticasone (FLONASE) 50 MCG/ACT nasal spray, Place 2 sprays into both nostrils daily., Disp: 16 g, Rfl: 6   allopurinol (ZYLOPRIM) 100 MG tablet, Take 1 tablet (100 mg total) by mouth daily., Disp: 90 tablet, Rfl: 1   amLODipine (NORVASC) 10 MG tablet, Take 1 tablet (10 mg total) by mouth daily., Disp: 90 tablet, Rfl: 1   aspirin EC 81 MG tablet, Take 1 tablet (81 mg total) by mouth daily., Disp: 30 tablet, Rfl: 11   atorvastatin (LIPITOR) 80 MG tablet, TAKE 1 TABLET BY MOUTH EVERY DAY, Disp: 90 tablet, Rfl: 1   Blood Glucose Monitoring Suppl (ONE TOUCH ULTRA 2) w/Device KIT, Use as directed 3 times daily before meals., Disp: 1 kit, Rfl: 0   carvedilol (COREG) 6.25 MG tablet, Take 1 tablet (6.25 mg total) by mouth 2 (two) times daily with a meal., Disp: 180 tablet, Rfl: 1   clotrimazole (LOTRIMIN) 1 % cream, Apply 1 application topically 2 (two) times daily., Disp: 30 g, Rfl: 0   glucose blood (ONETOUCH ULTRA) test strip, Use as instructed 3 times daily, Disp: 100  each, Rfl: 12   Lancet Devices (ACCU-CHEK SOFTCLIX) lancets, Use as instructed, Disp: 1 each, Rfl: 0   Lancets (ONETOUCH ULTRASOFT) lancets, Use as instructed 3 times daily, Disp: 100 each, Rfl: 12   metFORMIN (GLUCOPHAGE-XR) 500 MG 24 hr tablet, TAKE 1 TABLET(500 MG) BY MOUTH DAILY WITH BREAKFAST, Disp: 90 tablet, Rfl: 1   ofloxacin (FLOXIN) 0.3 % OTIC solution, Place 10 drops daily into the left ear. For 7 days., Disp: 5 mL, Rfl: 0   pantoprazole (PROTONIX) 40 MG tablet, Take 1 tablet (40 mg total) by mouth daily., Disp: 90 tablet, Rfl: 1   valsartan-hydrochlorothiazide (DIOVAN-HCT) 320-25 MG tablet, Take 1 tablet by mouth daily., Disp: 90 tablet, Rfl: 1  Current Facility-Administered Medications:    0.9 %  sodium chloride infusion, 500 mL, Intravenous, Once, Gatha Mayer, MD   carbamide peroxide (DEBROX) 6.5 % otic solution 5 drop, 5 drop, Left EAR, Once, Langeland, Dawn T, MD   Medications ordered in this encounter:  Meds ordered this encounter  Medications   amoxicillin-clavulanate (AUGMENTIN) 875-125 MG tablet    Sig: Take 1 tablet by mouth 2 (two) times daily.    Dispense:  20 tablet    Refill:  0    Order Specific Question:   Supervising Provider    Answer:   Tresa Garter [id number]   fluticasone (FLONASE) 50 MCG/ACT nasal spray    Sig: Place 2 sprays into both nostrils daily.    Dispense:  16 g    Refill:  6    Order Specific Question:   Supervising Provider    Answer:   Tresa Garter [id number]   cetirizine (ZYRTEC ALLERGY) 10 MG tablet    Sig: Take 1 tablet (10 mg total) by mouth daily.    Dispense:  30 tablet    Refill:  11    Order Specific Question:   Supervising Provider    Answer:   Tresa Garter [id number]     *If you need refills on other medications prior to your next appointment, please contact your pharmacy*  Follow-Up: Call back or seek an in-person evaluation if the symptoms worsen or if the condition fails to improve as  anticipated.  [place] [phone]  Other Instructions Sinus Infection, Adult A sinus infection, also called sinusitis, is inflammation of your sinuses. Sinuses are hollow spaces in the bones around your face. Your sinuses are located: Around your eyes. In the middle of your forehead. Behind your nose. In your cheekbones. Mucus normally drains out of your sinuses. When your nasal tissues become inflamed or swollen, mucus can become trapped or blocked. This allows bacteria, viruses, and fungi to grow, which leads to infection. Most infections of the sinuses are caused by a virus. A sinus infection can develop quickly. It can last for up to 4 weeks (acute) or for more than 12 weeks (chronic). A sinus infection often develops after a cold. What are the causes? This condition is caused by anything that creates swelling in the sinuses or stops mucus from draining. This includes: Allergies. Asthma. Infection from bacteria or viruses. Deformities or blockages in your nose or sinuses. Abnormal growths in the nose (nasal polyps). Pollutants, such as chemicals or irritants in the air. Infection from fungi. This is rare. What increases the risk? You are more likely to develop this condition if you: Have a weak body defense system (immune system). Do a lot of swimming or diving. Overuse nasal sprays. Smoke. What are the signs or symptoms? The main symptoms of this condition are pain and a feeling of pressure around the affected sinuses. Other symptoms include: Stuffy nose or congestion that makes it difficult to breathe through your nose. Thick yellow or greenish drainage from your nose. Tenderness, swelling, and warmth over the affected sinuses. A cough that may get worse at night. Decreased sense of smell and taste. Extra mucus that collects in the throat or the back of the nose (postnasal drip) causing a sore throat or bad breath. Tiredness (fatigue). Fever. How is  this diagnosed? This condition is diagnosed based on: Your symptoms. Your medical history. A physical exam. Tests to find out if your condition is acute or chronic. This may include: Checking your nose for nasal polyps. Viewing your sinuses using a device that has a light (endoscope). Testing for allergies or bacteria. Imaging tests, such as an MRI or CT scan. In rare cases, a bone biopsy may be done to rule out more serious types of fungal sinus disease. How is this treated? Treatment for a sinus infection depends on the cause and whether your condition is chronic or acute. If caused by a virus, your symptoms should go away on their own within 10 days. You may be given medicines to relieve symptoms. They include: Medicines that shrink swollen nasal passages (decongestants). A spray that eases inflammation of the nostrils (topical intranasal corticosteroids). Rinses that help get rid of thick mucus in your nose (nasal saline washes).  Medicines that treat allergies (antihistamines). Over-the-counter pain relievers. If caused by bacteria, your health care provider may recommend waiting to see if your symptoms improve. Most bacterial infections will get better without antibiotic medicine. You may be given antibiotics if you have: A severe infection. A weak immune system. If caused by narrow nasal passages or nasal polyps, surgery may be needed. Follow these instructions at home: Medicines Take, use, or apply over-the-counter and prescription medicines only as told by your health care provider. These may include nasal sprays. If you were prescribed an antibiotic medicine, take it as told by your health care provider. Do not stop taking the antibiotic even if you start to feel better. Hydrate and humidify  Drink enough fluid to keep your urine pale yellow. Staying hydrated will help to thin your mucus. Use a cool mist humidifier to keep the humidity level in your home above 50%. Inhale  steam for 10-15 minutes, 3-4 times a day, or as told by your health care provider. You can do this in the bathroom while a hot shower is running. Limit your exposure to cool or dry air. Rest Rest as much as possible. Sleep with your head raised (elevated). Make sure you get enough sleep each night. General instructions  Apply a warm, moist washcloth to your face 3-4 times a day or as told by your health care provider. This will help with discomfort. Use nasal saline washes as often as told by your health care provider. Wash your hands often with soap and water to reduce your exposure to germs. If soap and water are not available, use hand sanitizer. Do not smoke. Avoid being around people who are smoking (secondhand smoke). Keep all follow-up visits. This is important. Contact a health care provider if: You have a fever. Your symptoms get worse. Your symptoms do not improve within 10 days. Get help right away if: You have a severe headache. You have persistent vomiting. You have severe pain or swelling around your face or eyes. You have vision problems. You develop confusion. Your neck is stiff. You have trouble breathing. These symptoms may be an emergency. Get help right away. Call 911. Do not wait to see if the symptoms will go away. Do not drive yourself to the hospital. Summary A sinus infection is soreness and inflammation of your sinuses. Sinuses are hollow spaces in the bones around your face. This condition is caused by nasal tissues that become inflamed or swollen. The swelling traps or blocks the flow of mucus. This allows bacteria, viruses, and fungi to grow, which leads to infection. If you were prescribed an antibiotic medicine, take it as told by your health care provider. Do not stop taking the antibiotic even if you start to feel better. Keep all follow-up visits. This is important. This information is not intended to replace advice given to you by your health care  provider. Make sure you discuss any questions you have with your health care provider. Document Revised: 09/18/2021 Document Reviewed: 09/18/2021 Elsevier Patient Education  [place].    If you have been instructed to have an in-person evaluation today at a local Urgent Care facility, please use the link below. It will take you to a list of all of our available [place] [facility], including address, phone number and hours of operation. Please do not delay care.  [facility] [facility]  If you or a family member do not have a primary care provider, use the link below to  schedule a visit and establish care. When you choose a [facility] primary care physician or advanced practice provider, you gain a long-term partner in health. Find a Primary Care Provider  Learn more about [place]'s in-office and virtual care options: [facility]

## 2023-01-06 NOTE — Progress Notes (Signed)
Established Patient Office Visit  Subjective   Patient ID: Henry Walker, male    DOB: November 30, 1960  Age: 62 y.o. MRN: [id number]  Chief Complaint  Patient presents with   Sinusitis   Virtual Visit via Video Note  I connected with Henry Walker on 01/06/23 at  2:20 PM EDT by a video enabled telemedicine application and verified that I am speaking with the correct person using two identifiers.  Location: Patient: Home  Provider: [facility]    I discussed the limitations of evaluation and management by telemedicine and the availability of in person appointments. The patient expressed understanding and agreed to proceed.  History of Present Illness: States that he has been having sinus pressure, a feeling of a "tin can on his head", significant nasal drainage for the past 10 days.  States that he has been eating and drinking okay, denies fever, chills, cough, body aches.  States that he has been using over-the-counter medications without relief.  No known sick contacts, did not take home COVID test.  States that he has significant history of getting sinus infections on a yearly basis.    No recent antibiotic use.     Observations/Objective: Medical history and current medications reviewed, no physical exam completed   Past Medical History:  Diagnosis Date   Diabetes mellitus without complication (HCC)    GERD (gastroesophageal reflux disease)    Gout    Hx of adenomatous colonic polyps 10/06/2014   Hyperlipidemia    Hypertension    Social History   Socioeconomic History   Marital status: Divorced    Spouse name: Not on file   Number of children: Not on file   Years of education: Not on file   Highest education level: Not on file  Occupational History   Not on file  Tobacco Use   Smoking status: Every Day    Packs/day: 0.50    Types: Cigarettes   Smokeless tobacco: Never  Vaping Use   Vaping Use: Never used  Substance and  Sexual Activity   Alcohol use: No    Alcohol/week: 0.0 standard drinks of alcohol   Drug use: No   Sexual activity: Not Currently  Other Topics Concern   Not on file  Social History Narrative   Retired Location manager (gas, IT trainer, cable)   Social Determinants of Health   Financial Resource Strain: Low Risk  (06/18/2022)   Overall Financial Resource Strain (CARDIA)    Difficulty of Paying Living Expenses: Not very hard  Food Insecurity: No Food Insecurity (06/18/2022)   Hunger Vital Sign    Worried About Running Out of Food in the Last Year: Never true    Ran Out of Food in the Last Year: Never true  Transportation Needs: No Transportation Needs (06/18/2022)   PRAPARE - Transportation    Lack of Transportation (Medical): No    Lack of Transportation (Non-Medical): No  Physical Activity: Insufficiently Active (06/18/2022)   Exercise Vital Sign    Days of Exercise per Week: 7 days    Minutes of Exercise per Session: 20 min  Stress: No Stress Concern Present (06/18/2022)   [place]    Feeling of Stress : Not at all  Social Connections: Socially Isolated (06/18/2022)   Social Connection and Isolation Panel [NHANES]    Frequency of Communication with Friends and Family: More than three times a week    Frequency of Social Gatherings with Friends  and Family: Three times a week    Attends Religious Services: Never    Active Member of Clubs or Organizations: No    Attends Archivist Meetings: Never    Marital Status: Divorced  Human resources officer Violence: Not on file   Family History  Problem Relation Age of Onset   Colon cancer Neg Hx    Esophageal cancer Neg Hx    Rectal cancer Neg Hx    Stomach cancer Neg Hx    Colon polyps Neg Hx    No Known Allergies  Review of Systems  Constitutional:  Negative for chills and fever.  HENT:  Positive for congestion and sinus pain. Negative for ear pain and sore  throat.   Eyes: Negative.   Respiratory:  Negative for cough and shortness of breath.   Cardiovascular:  Negative for chest pain.  Gastrointestinal:  Negative for abdominal pain, nausea and vomiting.  Genitourinary: Negative.   Musculoskeletal:  Negative for myalgias.  Skin: Negative.   Neurological:  Positive for headaches.  Endo/Heme/Allergies: Negative.   Psychiatric/Behavioral: Negative.       Assessment & Plan:   Problem List Items Addressed This Visit   None Visit Diagnoses     Acute non-recurrent frontal sinusitis    -  Primary   Relevant Medications   amoxicillin-clavulanate (AUGMENTIN) 875-125 MG tablet   fluticasone (FLONASE) 50 MCG/ACT nasal spray   cetirizine (ZYRTEC ALLERGY) 10 MG tablet   Seasonal allergies       Relevant Medications   cetirizine (ZYRTEC ALLERGY) 10 MG tablet       Assessment and Plan: 1. Acute non-recurrent frontal sinusitis Trial Augmentin, Flonase, Zyrtec.  Patient education given on supportive care, red flags given for prompt reevaluation - amoxicillin-clavulanate (AUGMENTIN) 875-125 MG tablet; Take 1 tablet by mouth 2 (two) times daily.  Dispense: 20 tablet; Refill: 0 - fluticasone (FLONASE) 50 MCG/ACT nasal spray; Place 2 sprays into both nostrils daily.  Dispense: 16 g; Refill: 6  2. Seasonal allergies  - cetirizine (ZYRTEC ALLERGY) 10 MG tablet; Take 1 tablet (10 mg total) by mouth daily.  Dispense: 30 tablet; Refill: 11   Follow Up Instructions:    I discussed the assessment and treatment plan with the patient. The patient was provided an opportunity to ask questions and all were answered. The patient agreed with the plan and demonstrated an understanding of the instructions.   The patient was advised to call back or seek an in-person evaluation if the symptoms worsen or if the condition fails to improve as anticipated.  I provided 15 minutes of non-face-to-face time during this encounter.    Return if symptoms worsen or  fail to improve.    Loraine Grip Mayers, PA-C

## 2023-01-13 DIAGNOSIS — H2513 Age-related nuclear cataract, bilateral: Secondary | ICD-10-CM | POA: Diagnosis not present

## 2023-01-13 DIAGNOSIS — E119 Type 2 diabetes mellitus without complications: Secondary | ICD-10-CM | POA: Diagnosis not present

## 2023-01-13 LAB — HM DIABETES EYE EXAM

## 2023-02-28 IMAGING — CT CT CHEST LUNG CANCER SCREENING LOW DOSE W/O CM
2 of 5 series · 15 of 40 positions shown, 18 images · non-contrast
Comparison: None Available.

CLINICAL DATA: 60-year-old male with 45 pack-year history of
smoking. Lung cancer screening.



[Series 4: lung 1.00 br44 cor · coronal · 0.66mm/px · 3 of 319 slices shown]
[im 64/319  lung]
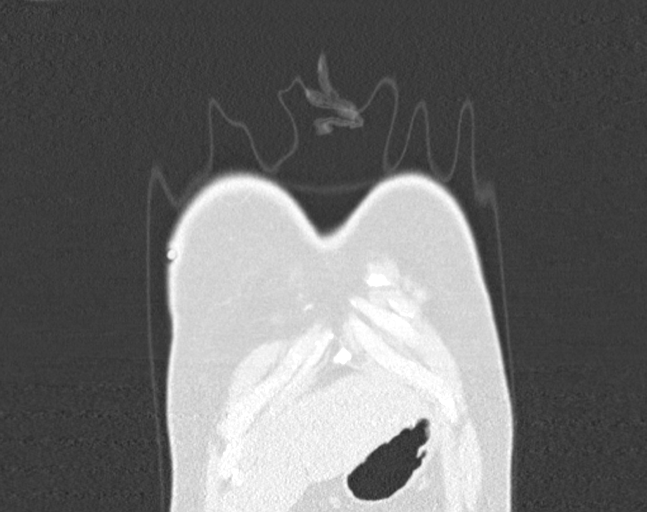
[im 128/319  lung]
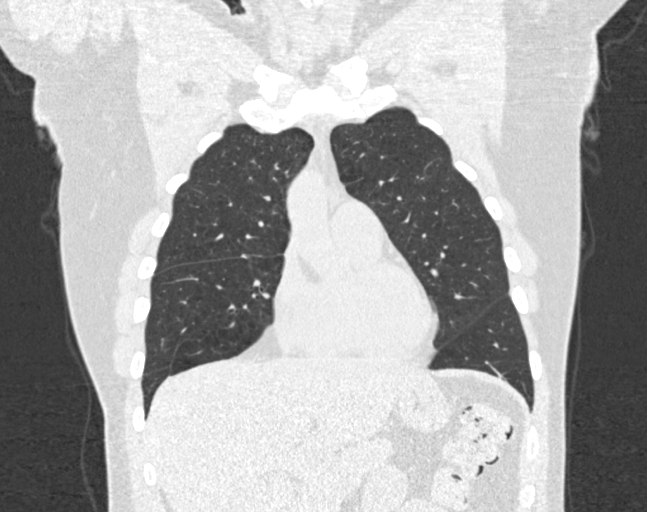
[im 191/319  lung]
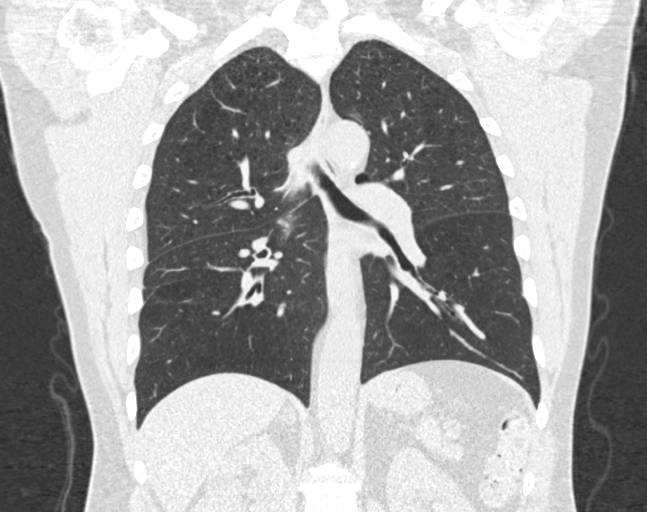

[Series 9: lung 1.00 br60 axial · axial · 0.81mm/px · z∈[-1229,-924]mm · 12 of 337 slices shown, 15 images]
[im 16/337  mediastinal]
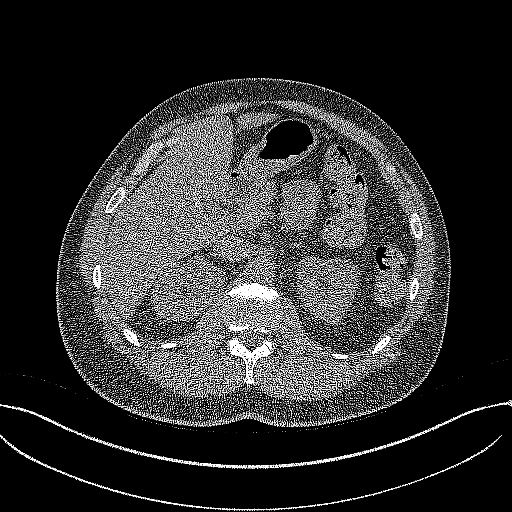
[im 16/337  lung]
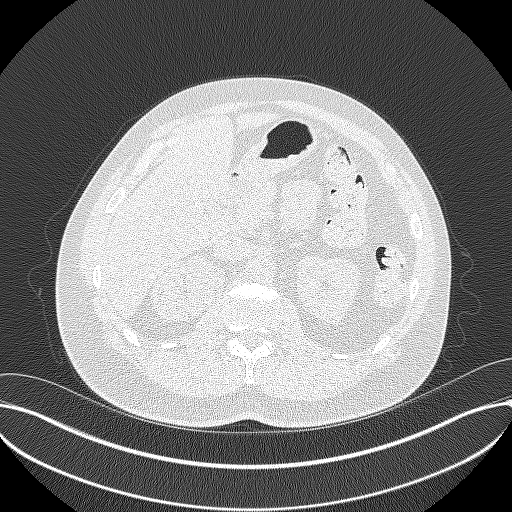
[im 46/337  lung]
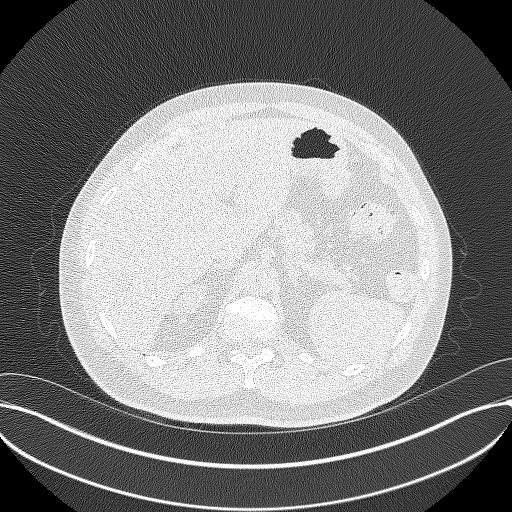
[im 77/337  lung]
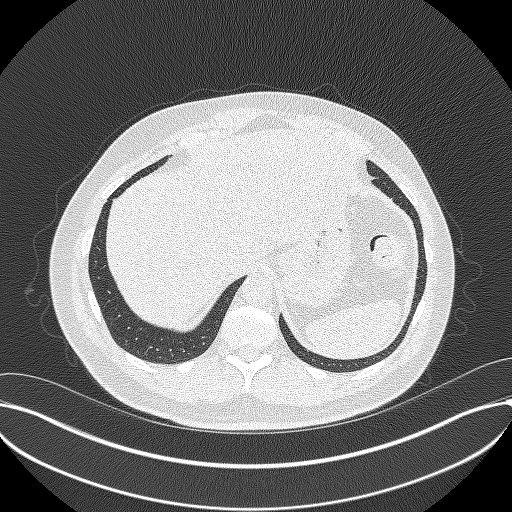
[im 107/337  lung]
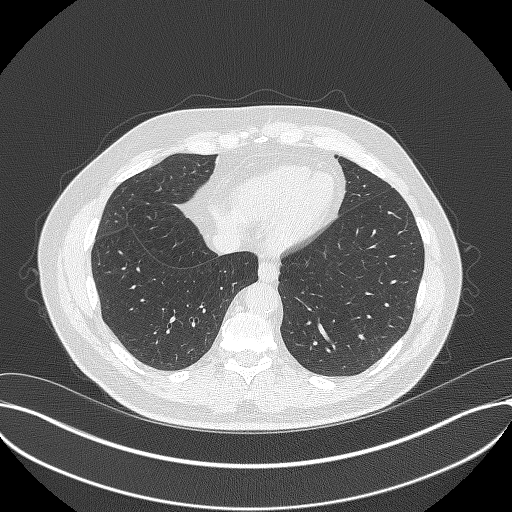
[im 123/337  mediastinal]
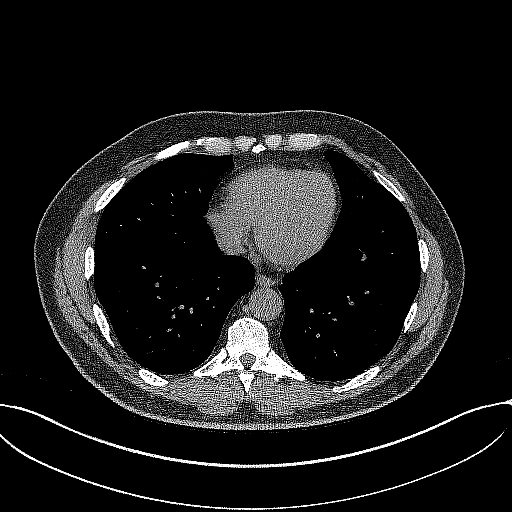
[im 123/337  lung]
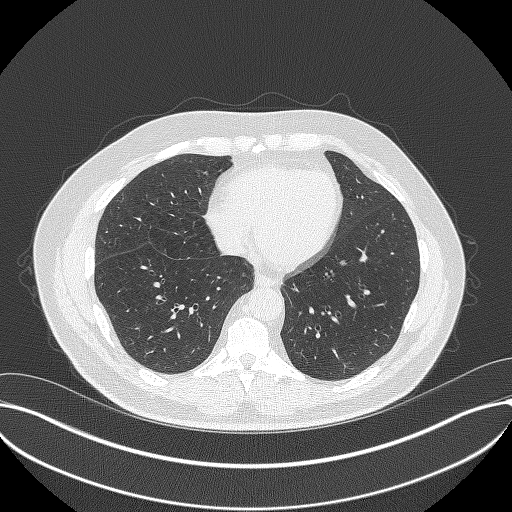
[im 153/337  lung]
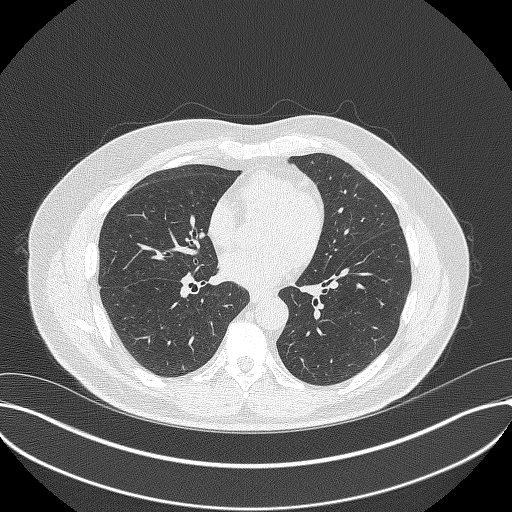
[im 184/337  lung]
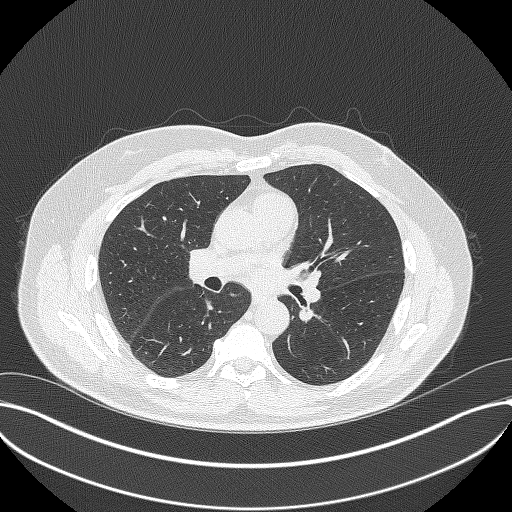
[im 214/337  lung]
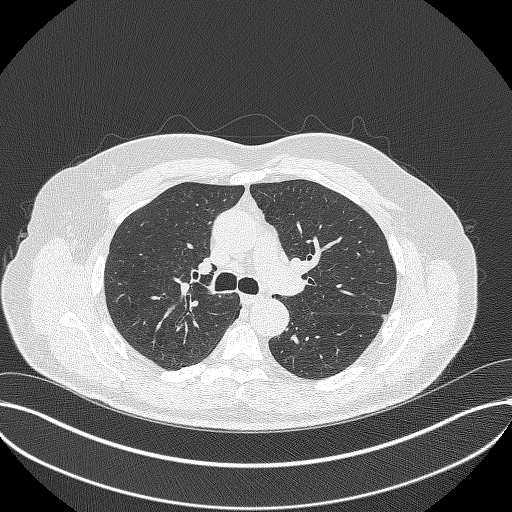
[im 230/337  mediastinal]
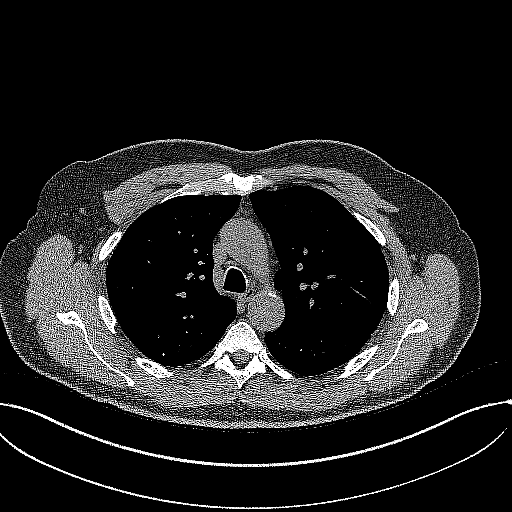
[im 230/337  lung]
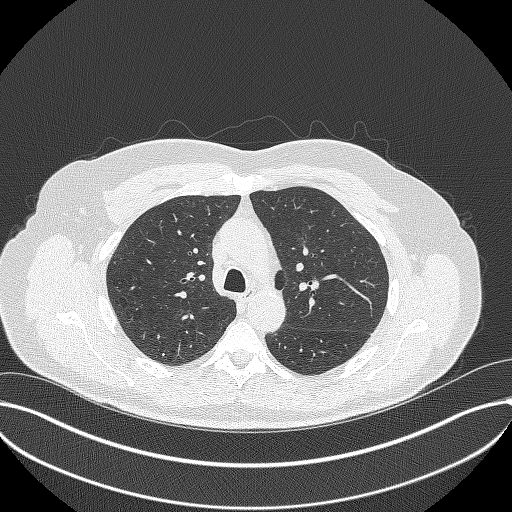
[im 260/337  lung]
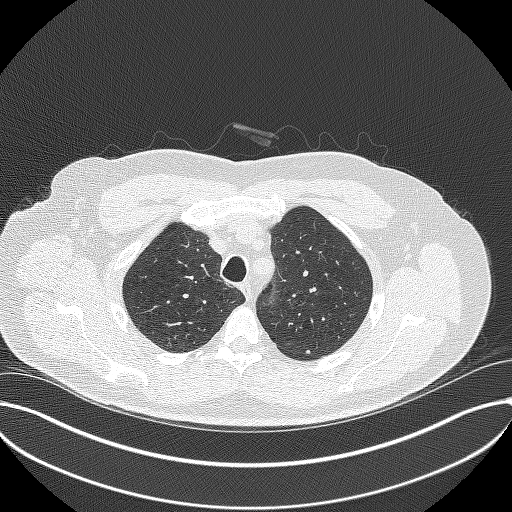
[im 291/337  lung]
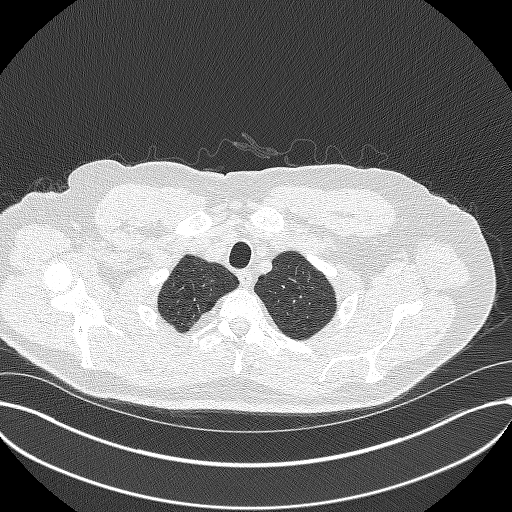
[im 321/337  lung]
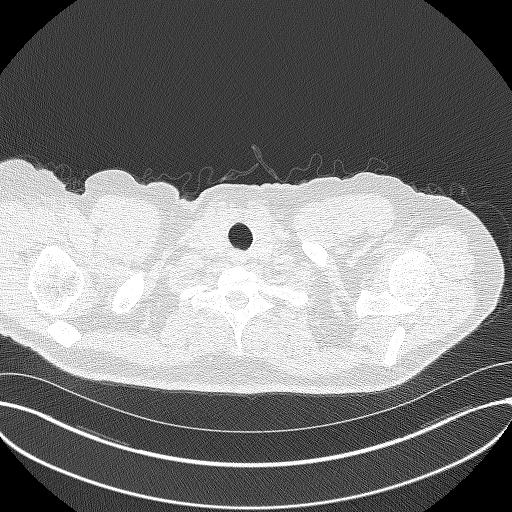

[15 of 40 positions shown; findings below may reference images not displayed]

FINDINGS: Cardiovascular: The heart size is normal. No substantial pericardial
effusion. Coronary artery calcification is evident. Mild
atherosclerotic calcification is noted in the wall of the thoracic
aorta.

Mediastinum/Nodes: No mediastinal lymphadenopathy. No evidence for
gross hilar lymphadenopathy although assessment is limited by the
lack of intravenous contrast on the current study. The esophagus has
normal imaging features. There is no axillary lymphadenopathy.

Lungs/Pleura: Centrilobular and paraseptal emphysema evident.
Numerous calcified granulomata are identified in the lungs
bilaterally. Dominant noncalcified nodule is a central left lower
lobe 6.3 mm nodule on image 196. 3.7 mm posterior left upper lobe
nodule identified on image 78. No focal airspace consolidation. No
pleural effusion.

Upper Abdomen: Unremarkable.

Musculoskeletal: No worrisome lytic or sclerotic osseous
abnormality.
IMPRESSION: 1. Lung-RADS 3, probably benign findings. Short-term follow-up in 6
months is recommended with repeat low-dose chest CT without contrast
(please use the following order, "CT CHEST LCS NODULE FOLLOW-UP W/O
CM").
2. Aortic Atherosclerosis (GH4GR-KV5.5) and Emphysema (GH4GR-ZDB.N).

These results will be called to the ordering clinician or
representative by the Radiologist Assistant, and communication
documented in the PACS or [REDACTED].

## 2023-03-14 ENCOUNTER — Other Ambulatory Visit: Payer: Self-pay

## 2023-03-14 NOTE — Progress Notes (Signed)
   Henry Walker 23-Apr-1961 [id number]  Patient showed interest in getting home BP cuff Patient is currently taking ACEi and ARB, reported no signs/symptoms of hypotension Reported diarrhea and ED.  Patient outreached by Francetta Found, PharmD Candidate on 03/14/2023 while they were picking up prescriptions at [facility].  Blood Pressure Readings: Last documented ambulatory systolic blood pressure: 145 Last documented ambulatory diastolic blood pressure: 60 Does the patient have a validated home blood pressure machine?: No   Medication review was performed. Is the patient taking their medications as prescribed?: Yes   The following barriers to adherence were noted: Does the patient have cost concerns?: No Does the patient have transportation concerns?: No Does the patient need assistance obtaining refills?: No Does the patient occassionally forget to take some of their prescribed medications?: No Does the patient feel like one/some of their medications make them feel poorly?: Yes (Recently has had diarrhea, does not know what medication is causing) Does the patient have questions or concerns about their medications?: No (Patient concerned about erectile disfunction) Does the patient have a follow up scheduled with their primary care provider/cardiologist?: Yes   Interventions: Interventions Completed: Medications were reviewed, Patient was educated on how to access home blood pressure machine  The patient has follow up scheduled:  PCP: Hoy Register, MD   Francetta Found, Student-PharmD

## 2023-03-18 ENCOUNTER — Encounter: Payer: Self-pay | Admitting: Family Medicine

## 2023-03-18 ENCOUNTER — Ambulatory Visit: Payer: Medicare Other | Attending: Family Medicine | Admitting: Family Medicine

## 2023-03-18 VITALS — BP 144/77 | HR 54 | Ht 66.0 in | Wt 174.8 lb

## 2023-03-18 DIAGNOSIS — I152 Hypertension secondary to endocrine disorders: Secondary | ICD-10-CM | POA: Diagnosis not present

## 2023-03-18 DIAGNOSIS — K219 Gastro-esophageal reflux disease without esophagitis: Secondary | ICD-10-CM | POA: Diagnosis not present

## 2023-03-18 DIAGNOSIS — G4709 Other insomnia: Secondary | ICD-10-CM

## 2023-03-18 DIAGNOSIS — E1169 Type 2 diabetes mellitus with other specified complication: Secondary | ICD-10-CM | POA: Diagnosis not present

## 2023-03-18 DIAGNOSIS — I700 Atherosclerosis of aorta: Secondary | ICD-10-CM | POA: Diagnosis not present

## 2023-03-18 DIAGNOSIS — E1159 Type 2 diabetes mellitus with other circulatory complications: Secondary | ICD-10-CM

## 2023-03-18 DIAGNOSIS — J438 Other emphysema: Secondary | ICD-10-CM | POA: Diagnosis not present

## 2023-03-18 DIAGNOSIS — B351 Tinea unguium: Secondary | ICD-10-CM | POA: Diagnosis not present

## 2023-03-18 DIAGNOSIS — J439 Emphysema, unspecified: Secondary | ICD-10-CM | POA: Insufficient documentation

## 2023-03-18 DIAGNOSIS — Z7984 Long term (current) use of oral hypoglycemic drugs: Secondary | ICD-10-CM

## 2023-03-18 DIAGNOSIS — Z125 Encounter for screening for malignant neoplasm of prostate: Secondary | ICD-10-CM

## 2023-03-18 DIAGNOSIS — E785 Hyperlipidemia, unspecified: Secondary | ICD-10-CM

## 2023-03-18 LAB — POCT GLYCOSYLATED HEMOGLOBIN (HGB A1C): HbA1c, POC (controlled diabetic range): 6.7 % (ref 0.0–7.0)

## 2023-03-18 MED ORDER — TRAZODONE HCL 50 MG PO TABS
50.0000 mg | ORAL_TABLET | Freq: Every evening | ORAL | 1 refills | Status: DC | PRN
Start: 2023-03-18 — End: 2023-09-22

## 2023-03-18 MED ORDER — VALSARTAN-HYDROCHLOROTHIAZIDE 320-25 MG PO TABS: 1.0000 | ORAL_TABLET | Freq: Every day | ORAL | 1 refills | Status: DC

## 2023-03-18 MED ORDER — METFORMIN HCL ER 500 MG PO TB24
ORAL_TABLET | ORAL | 1 refills | Status: DC
Start: 2023-03-18 — End: 2023-09-22

## 2023-03-18 MED ORDER — PANTOPRAZOLE SODIUM 40 MG PO TBEC: 40.0000 mg | DELAYED_RELEASE_TABLET | Freq: Every day | ORAL | 1 refills | Status: DC

## 2023-03-18 MED ORDER — AMLODIPINE BESYLATE 10 MG PO TABS
10.0000 mg | ORAL_TABLET | Freq: Every day | ORAL | 1 refills | Status: DC
Start: 2023-03-18 — End: 2023-09-16

## 2023-03-18 MED ORDER — ALBUTEROL SULFATE HFA 108 (90 BASE) MCG/ACT IN AERS
2.0000 | INHALATION_SPRAY | Freq: Four times a day (QID) | RESPIRATORY_TRACT | 2 refills | Status: DC | PRN
Start: 2023-03-18 — End: 2024-06-22

## 2023-03-18 MED ORDER — CARVEDILOL 12.5 MG PO TABS: 12.5000 mg | ORAL_TABLET | Freq: Two times a day (BID) | ORAL | 1 refills | Status: DC

## 2023-03-18 MED ORDER — ATORVASTATIN CALCIUM 80 MG PO TABS: ORAL_TABLET | ORAL | 1 refills | Status: DC

## 2023-03-18 NOTE — Patient Instructions (Signed)
Insomnia Insomnia is a sleep disorder that makes it difficult to fall asleep or stay asleep. Insomnia can cause fatigue, low energy, difficulty concentrating, mood swings, and poor performance at work or school. There are three different ways to classify insomnia: Difficulty falling asleep. Difficulty staying asleep. Waking up too early in the morning. Any type of insomnia can be long-term (chronic) or short-term (acute). Both are common. Short-term insomnia usually lasts for 3 months or less. Chronic insomnia occurs at least three times a week for longer than 3 months. What are the causes? Insomnia may be caused by another condition, situation, or substance, such as: Having certain mental health conditions, such as anxiety and depression. Using caffeine, alcohol, tobacco, or drugs. Having gastrointestinal conditions, such as gastroesophageal reflux disease (GERD). Having certain medical conditions. These include: Asthma. Alzheimer's disease. Stroke. Chronic pain. An overactive thyroid gland (hyperthyroidism). Other sleep disorders, such as restless legs syndrome and sleep apnea. Menopause. Sometimes, the cause of insomnia may not be known. What increases the risk? Risk factors for insomnia include: Gender. Females are affected more often than males. Age. Insomnia is more common as people get older. Stress and certain medical and mental health conditions. Lack of exercise. Having an irregular work schedule. This may include working night shifts and traveling between different time zones. What are the signs or symptoms? If you have insomnia, the main symptom is having trouble falling asleep or having trouble staying asleep. This may lead to other symptoms, such as: Feeling tired or having low energy. Feeling nervous about going to sleep. Not feeling rested in the morning. Having trouble concentrating. Feeling irritable, anxious, or depressed. How is this diagnosed? This condition  may be diagnosed based on: Your symptoms and medical history. Your health care provider may ask about: Your sleep habits. Any medical conditions you have. Your mental health. A physical exam. How is this treated? Treatment for insomnia depends on the cause. Treatment may focus on treating an underlying condition that is causing the insomnia. Treatment may also include: Medicines to help you sleep. Counseling or therapy. Lifestyle adjustments to help you sleep better. Follow these instructions at home: Eating and drinking  Limit or avoid alcohol, caffeinated beverages, and products that contain nicotine and tobacco, especially close to bedtime. These can disrupt your sleep. Do not eat a large meal or eat spicy foods right before bedtime. This can lead to digestive discomfort that can make it hard for you to sleep. Sleep habits  Keep a sleep diary to help you and your health care provider figure out what could be causing your insomnia. Write down: When you sleep. When you wake up during the night. How well you sleep and how rested you feel the next day. Any side effects of medicines you are taking. What you eat and drink. Make your bedroom a dark, comfortable place where it is easy to fall asleep. Put up shades or blackout curtains to block light from outside. Use a white noise machine to block noise. Keep the temperature cool. Limit screen use before bedtime. This includes: Not watching TV. Not using your smartphone, tablet, or computer. Stick to a routine that includes going to bed and waking up at the same times every day and night. This can help you fall asleep faster. Consider making a quiet activity, such as reading, part of your nighttime routine. Try to avoid taking naps during the day so that you sleep better at night. Get out of bed if you are still awake after   15 minutes of trying to sleep. Keep the lights down, but try reading or doing a quiet activity. When you feel  sleepy, go back to bed. General instructions Take over-the-counter and prescription medicines only as told by your health care provider. Exercise regularly as told by your health care provider. However, avoid exercising in the hours right before bedtime. Use relaxation techniques to manage stress. Ask your health care provider to suggest some techniques that may work well for you. These may include: Breathing exercises. Routines to release muscle tension. Visualizing peaceful scenes. Make sure that you drive carefully. Do not drive if you feel very sleepy. Keep all follow-up visits. This is important. Contact a health care provider if: You are tired throughout the day. You have trouble in your daily routine due to sleepiness. You continue to have sleep problems, or your sleep problems get worse. Get help right away if: You have thoughts about hurting yourself or someone else. Get help right away if you feel like you may hurt yourself or others, or have thoughts about taking your own life. Go to your nearest emergency room or: Call 911. Call the National Suicide Prevention Lifeline at 1-800-273-8255 or 988. This is open 24 hours a day. Text the Crisis Text Line at 741741. Summary Insomnia is a sleep disorder that makes it difficult to fall asleep or stay asleep. Insomnia can be long-term (chronic) or short-term (acute). Treatment for insomnia depends on the cause. Treatment may focus on treating an underlying condition that is causing the insomnia. Keep a sleep diary to help you and your health care provider figure out what could be causing your insomnia. This information is not intended to replace advice given to you by your health care provider. Make sure you discuss any questions you have with your health care provider. Document Revised: 09/24/2021 Document Reviewed: 09/24/2021 Elsevier Patient Education  2023 [employer]  

## 2023-03-18 NOTE — Progress Notes (Signed)
Not sleeping 

## 2023-03-18 NOTE — Progress Notes (Signed)
Subjective:  Patient ID: Henry Walker, male    DOB: [birth year]  Age: 62 y.o. MRN: [id number]  CC: Diabetes   HPI Henry Walker is a 62 y.o. year old male with a history of type 2 diabetes mellitus (A1c 6.7), hypertension, hyperlipidemia, GERD, gout, emphysema and tobacco abuse (0.5 ppd since he was 14) who presents for follow-up visit   Interval History:  He goes to bed at 11pm and is awake at 2-3am. Denies caffeine intake and does not take naps. He walks daily. Symptoms have been present for a few months. He does not think he snores.  He has had no Gout flares in a while and no longer needs his Allopurinol as he cut out beef. He is doing well on metformin and he has no hypoglycemia, no neuropathy or visual concerns.  Tolerating his antihypertensive and his statin. Low-dose chest CT for lung cancer screen had revealed presence of emphysema and aortic atherosclerosis.  He does endorse intermittent dyspnea but no chronic cough.  He would like a prescription for an inhaler. His left big toe sometimes hurts on the medial aspect of his toenail especially when he wears sneakers but he does not have the problem when he wears slides. Past Medical History:  Diagnosis Date   Diabetes mellitus without complication (HCC)    GERD (gastroesophageal reflux disease)    Gout    Hx of adenomatous colonic polyps 10/06/2014   Hyperlipidemia    Hypertension     Past Surgical History:  Procedure Laterality Date   BACK SURGERY     COLONOSCOPY      Family History  Problem Relation Age of Onset   Colon cancer Neg Hx    Esophageal cancer Neg Hx    Rectal cancer Neg Hx    Stomach cancer Neg Hx    Colon polyps Neg Hx     Social History   Socioeconomic History   Marital status: Divorced    Spouse name: Not on file   Number of children: Not on file   Years of education: Not on file   Highest education level: Not on file  Occupational History   Not on file  Tobacco Use    Smoking status: Every Day    Packs/day: .5    Types: Cigarettes   Smokeless tobacco: Never  Vaping Use   Vaping Use: Never used  Substance and Sexual Activity   Alcohol use: No    Alcohol/week: 0.0 standard drinks of alcohol   Drug use: No   Sexual activity: Not Currently  Other Topics Concern   Not on file  Social History Narrative   Retired Customer service manager (gas, Mining engineer, cable)   Social Determinants of Health   Financial Resource Strain: Low Risk  (06/18/2022)   Overall Financial Resource Strain (CARDIA)    Difficulty of Paying Living Expenses: Not very hard  Food Insecurity: No Food Insecurity (06/18/2022)   Hunger Vital Sign    Worried About Running Out of Food in the Last Year: Never true    Ran Out of Food in the Last Year: Never true  Transportation Needs: No Transportation Needs (06/18/2022)   PRAPARE - Transportation    Lack of Transportation (Medical): No    Lack of Transportation (Non-Medical): No  Physical Activity: Insufficiently Active (06/18/2022)   Exercise Vital Sign    Days of Exercise per Week: 7 days    Minutes of Exercise per Session: 20 min  Stress: No Stress Concern Present (06/18/2022)  [employer] of Occupational Health - Occupational Stress Questionnaire    Feeling of Stress : Not at all  Social Connections: Socially Isolated (06/18/2022)   Social Connection and Isolation Panel [NHANES]    Frequency of Communication with Friends and Family: More than three times a week    Frequency of Social Gatherings with Friends and Family: Three times a week    Attends Religious Services: Never    Active Member of Clubs or Organizations: No    Attends Banker Meetings: Never    Marital Status: Divorced    No Known Allergies  Outpatient Medications Prior to Visit  Medication Sig Dispense Refill   allopurinol (ZYLOPRIM) 100 MG tablet Take 1 tablet (100 mg total) by mouth daily. 90 tablet 1   amoxicillin-clavulanate (AUGMENTIN) 875-125  MG tablet Take 1 tablet by mouth 2 (two) times daily. 20 tablet 0   aspirin EC 81 MG tablet Take 1 tablet (81 mg total) by mouth daily. 30 tablet 11   Blood Glucose Monitoring Suppl (ONE TOUCH ULTRA 2) w/Device KIT Use as directed 3 times daily before meals. 1 kit 0   cetirizine (ZYRTEC ALLERGY) 10 MG tablet Take 1 tablet (10 mg total) by mouth daily. 30 tablet 11   clotrimazole (LOTRIMIN) 1 % cream Apply 1 application topically 2 (two) times daily. 30 g 0   fluticasone (FLONASE) 50 MCG/ACT nasal spray Place 2 sprays into both nostrils daily. 16 g 6   glucose blood (ONETOUCH ULTRA) test strip Use as instructed 3 times daily 100 each 12   Lancet Devices (ACCU-CHEK SOFTCLIX) lancets Use as instructed 1 each 0   Lancets (ONETOUCH ULTRASOFT) lancets Use as instructed 3 times daily 100 each 12   ofloxacin (FLOXIN) 0.3 % OTIC solution Place 10 drops daily into the left ear. For 7 days. 5 mL 0   amLODipine (NORVASC) 10 MG tablet Take 1 tablet (10 mg total) by mouth daily. 90 tablet 1   atorvastatin (LIPITOR) 80 MG tablet TAKE 1 TABLET BY MOUTH EVERY DAY 90 tablet 1   carvedilol (COREG) 6.25 MG tablet Take 1 tablet (6.25 mg total) by mouth 2 (two) times daily with a meal. 180 tablet 1   lisinopril (ZESTRIL) 40 MG tablet Take 40 mg by mouth daily.     metFORMIN (GLUCOPHAGE-XR) 500 MG 24 hr tablet TAKE 1 TABLET(500 MG) BY MOUTH DAILY WITH BREAKFAST 90 tablet 1   pantoprazole (PROTONIX) 40 MG tablet Take 1 tablet (40 mg total) by mouth daily. 90 tablet 1   valsartan-hydrochlorothiazide (DIOVAN-HCT) 320-25 MG tablet Take 1 tablet by mouth daily. 90 tablet 1   Facility-Administered Medications Prior to Visit  Medication Dose Route Frequency Provider Last Rate Last Admin   0.9 %  sodium chloride infusion  500 mL Intravenous Once Iva Boop, MD       carbamide peroxide (DEBROX) 6.5 % otic solution 5 drop  5 drop Left EAR Once Langeland, Dawn T, MD         ROS Review of Systems  Constitutional:   Negative for activity change and appetite change.  HENT:  Negative for sinus pressure and sore throat.   Respiratory:  Negative for chest tightness, shortness of breath and wheezing.   Cardiovascular:  Negative for chest pain and palpitations.  Gastrointestinal:  Negative for abdominal distention, abdominal pain and constipation.  Genitourinary: Negative.   Musculoskeletal: Negative.   Psychiatric/Behavioral:  Negative for behavioral problems and dysphoric mood.     Objective:  BP Marland Kitchen)  144/77   Pulse (!) 54   Ht 5\' 6"  (1.676 m)   Wt 174 lb 12.8 oz (79.3 kg)   SpO2 97%   BMI 28.21 kg/m      03/18/2023   11:08 AM 03/18/2023   10:28 AM 09/17/2022    9:37 AM  BP/Weight  Systolic BP 144 146 145  Diastolic BP 77 83 60  Wt. (Lbs)  174.8   BMI  28.21 kg/m2       Physical Exam Constitutional:      Appearance: He is well-developed.  Cardiovascular:     Rate and Rhythm: Bradycardia present.     Heart sounds: Normal heart sounds. No murmur heard. Pulmonary:     Effort: Pulmonary effort is normal.     Breath sounds: Normal breath sounds. No wheezing or rales.  Chest:     Chest wall: No tenderness.  Abdominal:     General: Bowel sounds are normal. There is no distension.     Palpations: Abdomen is soft. There is no mass.     Tenderness: There is no abdominal tenderness.  Musculoskeletal:        General: Normal range of motion.     Right lower leg: No edema.     Left lower leg: No edema.  Neurological:     Mental Status: He is alert and oriented to person, place, and time.  Psychiatric:        Mood and Affect: Mood normal.    Diabetic Foot Exam - Simple   Simple Foot Form Diabetic Foot exam was performed with the following findings: Yes 03/18/2023 11:25 AM  Visual Inspection See comments: Yes Sensation Testing Intact to touch and monofilament testing bilaterally: Yes Pulse Check Posterior Tibialis and Dorsalis pulse intact bilaterally: Yes Comments Left great toenail  with thickened dystrophic toenail, early splitting of nail and skin        Latest Ref Rng & Units 09/17/2022    9:58 AM 02/07/2022    2:25 PM 09/12/2021    9:43 AM  CMP  Glucose 70 - 99 mg/dL 762  831  517   BUN 8 - 27 mg/dL 10  10  13    Creatinine 0.76 - 1.27 mg/dL 6.16  0.73  7.10   Sodium 134 - 144 mmol/L 143  141  143   Potassium 3.5 - 5.2 mmol/L 3.9  4.1  4.2   Chloride 96 - 106 mmol/L 102  104  105   CO2 20 - 29 mmol/L 23  21  24    Calcium 8.6 - 10.2 mg/dL 9.3  62.6  9.7   Total Protein 6.0 - 8.5 g/dL 7.1  7.2  7.1   Total Bilirubin 0.0 - 1.2 mg/dL 0.5  0.6  0.3   Alkaline Phos 44 - 121 IU/L 88  77  65   AST 0 - 40 IU/L 23  23  21    ALT 0 - 44 IU/L 26  31  30      Lipid Panel     Component Value Date/Time   CHOL 132 02/07/2022 1425   TRIG 89 02/07/2022 1425   HDL 29 (L) 02/07/2022 1425   CHOLHDL 4.6 02/07/2022 1425   CHOLHDL 5.9 (H) 01/05/2016 1155   VLDL 30 01/05/2016 1155   LDLCALC 86 02/07/2022 1425    CBC    Component Value Date/Time   WBC 9.7 02/07/2022 1425   WBC 10.1 06/16/2017 2020   RBC 5.54 02/07/2022 1425   RBC 5.06 06/16/2017 2020  HGB 15.1 02/07/2022 1425   HCT 44.9 02/07/2022 1425   PLT 315 02/07/2022 1425   MCV 81 02/07/2022 1425   MCH 27.3 02/07/2022 1425   MCH 28.5 06/16/2017 2020   MCHC 33.6 02/07/2022 1425   MCHC 35.2 06/16/2017 2020   RDW 13.7 02/07/2022 1425   LYMPHSABS 3.8 (H) 02/07/2022 1425   MONOABS 744 06/03/2016 1616   EOSABS 0.2 02/07/2022 1425   BASOSABS 0.1 02/07/2022 1425    Lab Results  Component Value Date   HGBA1C 6.7 03/18/2023    Assessment & Plan:  1. Type 2 diabetes mellitus with other specified complication, without long-term current use of insulin (HCC) Controlled with A1c of 6.7 Continue current regimen Counseled on Diabetic diet, my plate method, 161 minutes of moderate intensity exercise/week Blood sugar logs with fasting goals of 80-120 mg/dl, random of less than 096 and in the event of sugars less  than 60 mg/dl or greater than 045 mg/dl encouraged to notify the clinic. Advised on the need for annual eye exams, annual foot exams, Pneumonia vaccine. - POCT glycosylated hemoglobin (Hb A1C) - metFORMIN (GLUCOPHAGE-XR) 500 MG 24 hr tablet; TAKE 1 TABLET(500 MG) BY MOUTH DAILY WITH BREAKFAST  Dispense: 90 tablet; Refill: 1  2. Hypertension associated with diabetes (HCC) Uncontrolled Review of his medication bottles indicates he has been taking both lisinopril and valsartan/HCTZ Advised he was supposed to discontinue lisinopril and I have marked this on his bottle Increase Coreg dose from 6.25 mg twice daily to 12.5 mg twice daily Counseled on blood pressure goal of less than 130/80, low-sodium, DASH diet, medication compliance, 150 minutes of moderate intensity exercise per week. Discussed medication compliance, adverse effects. - amLODipine (NORVASC) 10 MG tablet; Take 1 tablet (10 mg total) by mouth daily.  Dispense: 90 tablet; Refill: 1 - carvedilol (COREG) 12.5 MG tablet; Take 1 tablet (12.5 mg total) by mouth 2 (two) times daily with a meal.  Dispense: 180 tablet; Refill: 1 - valsartan-hydrochlorothiazide (DIOVAN-HCT) 320-25 MG tablet; Take 1 tablet by mouth daily.  Dispense: 90 tablet; Refill: 1  3. Hyperlipidemia associated with type 2 diabetes mellitus (HCC) Controlled Low-cholesterol diet - atorvastatin (LIPITOR) 80 MG tablet; TAKE 1 TABLET BY MOUTH EVERY DAY  Dispense: 90 tablet; Refill: 1  4. Gastroesophageal reflux disease without esophagitis Stable Advised to use PPI intermittently as symptoms infrequent - pantoprazole (PROTONIX) 40 MG tablet; Take 1 tablet (40 mg total) by mouth daily.  Dispense: 90 tablet; Refill: 1  5. Other insomnia Uncontrolled Sleep hygiene discussed - traZODone (DESYREL) 50 MG tablet; Take 1 tablet (50 mg total) by mouth at bedtime as needed for sleep.  Dispense: 90 tablet; Refill: 1  6. Other emphysema (HCC) Incidental finding on CT He is mildly  symptomatic Will initiate albuterol MDI and if symptoms persist consider initiation of LAMA - albuterol (VENTOLIN HFA) 108 (90 Base) MCG/ACT inhaler; Inhale 2 puffs into the lungs every 6 (six) hours as needed for wheezing or shortness of breath.  Dispense: 8 g; Refill: 2  7. Screening for prostate cancer - PSA, total and free  8. Onychomycosis - Ambulatory referral to Podiatry  9. Aortic atherosclerosis (HCC) Continue statin Risk factor modification    Meds ordered this encounter  Medications   traZODone (DESYREL) 50 MG tablet    Sig: Take 1 tablet (50 mg total) by mouth at bedtime as needed for sleep.    Dispense:  90 tablet    Refill:  1   amLODipine (NORVASC) 10 MG tablet  Sig: Take 1 tablet (10 mg total) by mouth daily.    Dispense:  90 tablet    Refill:  1   atorvastatin (LIPITOR) 80 MG tablet    Sig: TAKE 1 TABLET BY MOUTH EVERY DAY    Dispense:  90 tablet    Refill:  1   carvedilol (COREG) 12.5 MG tablet    Sig: Take 1 tablet (12.5 mg total) by mouth 2 (two) times daily with a meal.    Dispense:  180 tablet    Refill:  1    Dose increase   metFORMIN (GLUCOPHAGE-XR) 500 MG 24 hr tablet    Sig: TAKE 1 TABLET(500 MG) BY MOUTH DAILY WITH BREAKFAST    Dispense:  90 tablet    Refill:  1   pantoprazole (PROTONIX) 40 MG tablet    Sig: Take 1 tablet (40 mg total) by mouth daily.    Dispense:  90 tablet    Refill:  1   valsartan-hydrochlorothiazide (DIOVAN-HCT) 320-25 MG tablet    Sig: Take 1 tablet by mouth daily.    Dispense:  90 tablet    Refill:  1   albuterol (VENTOLIN HFA) 108 (90 Base) MCG/ACT inhaler    Sig: Inhale 2 puffs into the lungs every 6 (six) hours as needed for wheezing or shortness of breath.    Dispense:  8 g    Refill:  2    Follow-up: Return in about 6 months (around 09/18/2023) for Chronic medical conditions.       Hoy Register, MD, FAAFP. [facility] [place], [place] [phone]   03/18/2023,  11:25 AM

## 2023-03-19 ENCOUNTER — Other Ambulatory Visit: Payer: Self-pay | Admitting: Family Medicine

## 2023-03-19 DIAGNOSIS — R972 Elevated prostate specific antigen [PSA]: Secondary | ICD-10-CM

## 2023-03-19 LAB — PSA, TOTAL AND FREE
PSA, Free Pct: 21.6 %
PSA, Free: 1.47 ng/mL
Prostate Specific Ag, Serum: 6.8 ng/mL — ABNORMAL HIGH (ref 0.0–4.0)

## 2023-04-04 ENCOUNTER — Ambulatory Visit: Payer: Medicare Other | Admitting: Podiatry

## 2023-04-04 DIAGNOSIS — Z79899 Other long term (current) drug therapy: Secondary | ICD-10-CM

## 2023-04-04 DIAGNOSIS — B351 Tinea unguium: Secondary | ICD-10-CM | POA: Diagnosis not present

## 2023-04-04 NOTE — Progress Notes (Unsigned)
Subjective:  Patient ID: Henry Walker, male    DOB: October 18, 1961,  MRN: [id number]  Chief Complaint  Patient presents with   Nail Problem    Left toenail fungus     62 y.o. male presents with the above complaint.  Patient presents with left hallux thickened and again dystrophic mycotic nails x 1.  Patient states been present for quite some time he wanted discuss treatment options for he has not seen and was progressing.  Denies any other complaints 7 out of 10 dull achy in nature.   Review of Systems: Negative except as noted in the HPI. Denies N/V/F/Ch.  Past Medical History:  Diagnosis Date   Diabetes mellitus without complication (HCC)    GERD (gastroesophageal reflux disease)    Gout    Hx of adenomatous colonic polyps 10/06/2014   Hyperlipidemia    Hypertension     Current Outpatient Medications:    albuterol (VENTOLIN HFA) 108 (90 Base) MCG/ACT inhaler, Inhale 2 puffs into the lungs every 6 (six) hours as needed for wheezing or shortness of breath., Disp: 8 g, Rfl: 2   allopurinol (ZYLOPRIM) 100 MG tablet, Take 1 tablet (100 mg total) by mouth daily., Disp: 90 tablet, Rfl: 1   amLODipine (NORVASC) 10 MG tablet, Take 1 tablet (10 mg total) by mouth daily., Disp: 90 tablet, Rfl: 1   amoxicillin-clavulanate (AUGMENTIN) 875-125 MG tablet, Take 1 tablet by mouth 2 (two) times daily., Disp: 20 tablet, Rfl: 0   aspirin EC 81 MG tablet, Take 1 tablet (81 mg total) by mouth daily., Disp: 30 tablet, Rfl: 11   atorvastatin (LIPITOR) 80 MG tablet, TAKE 1 TABLET BY MOUTH EVERY DAY, Disp: 90 tablet, Rfl: 1   Blood Glucose Monitoring Suppl (ONE TOUCH ULTRA 2) w/Device KIT, Use as directed 3 times daily before meals., Disp: 1 kit, Rfl: 0   carvedilol (COREG) 12.5 MG tablet, Take 1 tablet (12.5 mg total) by mouth 2 (two) times daily with a meal., Disp: 180 tablet, Rfl: 1   cetirizine (ZYRTEC ALLERGY) 10 MG tablet, Take 1 tablet (10 mg total) by mouth daily., Disp: 30 tablet, Rfl: 11    clotrimazole (LOTRIMIN) 1 % cream, Apply 1 application topically 2 (two) times daily., Disp: 30 g, Rfl: 0   fluticasone (FLONASE) 50 MCG/ACT nasal spray, Place 2 sprays into both nostrils daily., Disp: 16 g, Rfl: 6   glucose blood (ONETOUCH ULTRA) test strip, Use as instructed 3 times daily, Disp: 100 each, Rfl: 12   Lancet Devices (ACCU-CHEK SOFTCLIX) lancets, Use as instructed, Disp: 1 each, Rfl: 0   Lancets (ONETOUCH ULTRASOFT) lancets, Use as instructed 3 times daily, Disp: 100 each, Rfl: 12   metFORMIN (GLUCOPHAGE-XR) 500 MG 24 hr tablet, TAKE 1 TABLET(500 MG) BY MOUTH DAILY WITH BREAKFAST, Disp: 90 tablet, Rfl: 1   ofloxacin (FLOXIN) 0.3 % OTIC solution, Place 10 drops daily into the left ear. For 7 days., Disp: 5 mL, Rfl: 0   pantoprazole (PROTONIX) 40 MG tablet, Take 1 tablet (40 mg total) by mouth daily., Disp: 90 tablet, Rfl: 1   traZODone (DESYREL) 50 MG tablet, Take 1 tablet (50 mg total) by mouth at bedtime as needed for sleep., Disp: 90 tablet, Rfl: 1   valsartan-hydrochlorothiazide (DIOVAN-HCT) 320-25 MG tablet, Take 1 tablet by mouth daily., Disp: 90 tablet, Rfl: 1  Current Facility-Administered Medications:    0.9 %  sodium chloride infusion, 500 mL, Intravenous, Once, Iva Boop, MD   carbamide peroxide (DEBROX) 6.5 % otic  solution 5 drop, 5 drop, Left EAR, Once, Langeland, Dawn T, MD  Social History   Tobacco Use  Smoking Status Every Day   Packs/day: .5   Types: Cigarettes  Smokeless Tobacco Never    No Known Allergies Objective:  There were no vitals filed for this visit. There is no height or weight on file to calculate BMI. Constitutional Well developed. Well nourished.  Vascular Dorsalis pedis pulses palpable bilaterally. Posterior tibial pulses palpable bilaterally. Capillary refill normal to all digits.  No cyanosis or clubbing noted. Pedal hair growth normal.  Neurologic Normal speech. Oriented to person, place, and time. Epicritic sensation to  light touch grossly present bilaterally.  Dermatologic Nails thickened elongated dystrophic mycotic toenails x 1 to the left hallux Skin within normal limits  Orthopedic: Normal joint ROM without pain or crepitus bilaterally. No visible deformities. No bony tenderness.   Radiographs: None Assessment:   1. Long-term use of high-risk medication    Plan:  Patient was evaluated and treated and all questions answered.  Left hallux onychomycosis -Educated the patient on the etiology of onychomycosis and various treatment options associated with improving the fungal load.  I explained to the patient that there is 3 treatment options available to treat the onychomycosis including topical, p.o., laser treatment.  Patient elected to undergo p.o. options with Lamisil/terbinafine therapy.  In order for me to start the medication therapy, I explained to the patient the importance of evaluating the liver and obtaining the liver function test.  Once the liver function test comes back normal I will start him on 58-month course of Lamisil therapy.  Patient understood all risk and would like to proceed with Lamisil therapy.  I have asked the patient to immediately stop the Lamisil therapy if she has any reactions to it and call the office or go to the emergency room right away.  Patient states understanding   No follow-ups on file.

## 2023-04-05 LAB — HEPATIC FUNCTION PANEL
ALT: 27 IU/L (ref 0–44)
AST: 21 IU/L (ref 0–40)
Albumin: 4.3 g/dL (ref 3.9–4.9)
Alkaline Phosphatase: 72 IU/L (ref 44–121)
Bilirubin Total: 0.6 mg/dL (ref 0.0–1.2)
Bilirubin, Direct: 0.18 mg/dL (ref 0.00–0.40)
Total Protein: 6.9 g/dL (ref 6.0–8.5)

## 2023-04-07 MED ORDER — TERBINAFINE HCL 250 MG PO TABS: 250.0000 mg | ORAL_TABLET | Freq: Every day | ORAL | 0 refills | Status: DC

## 2023-04-16 ENCOUNTER — Ambulatory Visit: Payer: 59 | Attending: Family Medicine

## 2023-04-16 ENCOUNTER — Telehealth: Payer: Self-pay

## 2023-04-16 VITALS — Ht 66.0 in | Wt 174.0 lb

## 2023-04-16 DIAGNOSIS — Z0000 Encounter for general adult medical examination without abnormal findings: Secondary | ICD-10-CM

## 2023-04-16 NOTE — Telephone Encounter (Signed)
Patient was seen for an annual wellness visit and states that he has been unable to start Terbinafine because the pharmacy is telling him that there is a problem with the prescription.

## 2023-04-16 NOTE — Progress Notes (Signed)
Subjective:   Henry Walker is a 62 y.o. male who presents for Medicare Annual/Subsequent preventive examination.  Visit Complete: Virtual  I connected with  [name] on 04/16/23 by a audio enabled telemedicine application and verified that I am speaking with the correct person using two identifiers.  Patient Location: Home  Provider Location: Home Office  I discussed the limitations of evaluation and management by telemedicine. The patient expressed understanding and agreed to proceed.  Review of Systems     Cardiac Risk Factors include: advanced age (>40men, >75 women);diabetes mellitus;dyslipidemia;hypertension;male gender     Objective:    Today's Vitals   04/16/23 1145  Weight: 174 lb (78.9 kg)  Height: 5\' 6"  (1.676 m)   Body mass index is 28.08 kg/m.     04/16/2023    1:16 PM 06/18/2022    2:51 PM 06/19/2021    1:20 PM 03/09/2021   12:01 PM 02/24/2021    1:11 PM 02/07/2020    9:13 AM 06/16/2017    8:20 PM  Advanced Directives  Does Patient Have a Medical Advance Directive? No No No No No No No  Would patient like information on creating a medical advance directive? Yes (MAU/Ambulatory/Procedural Areas - Information given) Yes (ED - Information included in AVS)  No - Patient declined       Current Medications (verified) Outpatient Encounter Medications as of 04/16/2023  Medication Sig   albuterol (VENTOLIN HFA) 108 (90 Base) MCG/ACT inhaler Inhale 2 puffs into the lungs every 6 (six) hours as needed for wheezing or shortness of breath.   allopurinol (ZYLOPRIM) 100 MG tablet Take 1 tablet (100 mg total) by mouth daily.   amLODipine (NORVASC) 10 MG tablet Take 1 tablet (10 mg total) by mouth daily.   amoxicillin-clavulanate (AUGMENTIN) 875-125 MG tablet Take 1 tablet by mouth 2 (two) times daily.   aspirin EC 81 MG tablet Take 1 tablet (81 mg total) by mouth daily.   atorvastatin (LIPITOR) 80 MG tablet TAKE 1 TABLET BY MOUTH EVERY DAY   Blood  Glucose Monitoring Suppl (ONE TOUCH ULTRA 2) w/Device KIT Use as directed 3 times daily before meals.   carvedilol (COREG) 12.5 MG tablet Take 1 tablet (12.5 mg total) by mouth 2 (two) times daily with a meal.   cetirizine (ZYRTEC ALLERGY) 10 MG tablet Take 1 tablet (10 mg total) by mouth daily.   clotrimazole (LOTRIMIN) 1 % cream Apply 1 application topically 2 (two) times daily.   fluticasone (FLONASE) 50 MCG/ACT nasal spray Place 2 sprays into both nostrils daily.   glucose blood (ONETOUCH ULTRA) test strip Use as instructed 3 times daily   Lancet Devices (ACCU-CHEK SOFTCLIX) lancets Use as instructed   Lancets (ONETOUCH ULTRASOFT) lancets Use as instructed 3 times daily   metFORMIN (GLUCOPHAGE-XR) 500 MG 24 hr tablet TAKE 1 TABLET(500 MG) BY MOUTH DAILY WITH BREAKFAST   ofloxacin (FLOXIN) 0.3 % OTIC solution Place 10 drops daily into the left ear. For 7 days.   pantoprazole (PROTONIX) 40 MG tablet Take 1 tablet (40 mg total) by mouth daily.   terbinafine (LAMISIL) 250 MG tablet Take 1 tablet (250 mg total) by mouth daily. (Patient not taking: Reported on 04/16/2023)   traZODone (DESYREL) 50 MG tablet Take 1 tablet (50 mg total) by mouth at bedtime as needed for sleep.   valsartan-hydrochlorothiazide (DIOVAN-HCT) 320-25 MG tablet Take 1 tablet by mouth daily.   [DISCONTINUED] tadalafil (CIALIS) 20 MG tablet Take 0.5-1 tablets (10-20 mg total) by mouth  every other day as needed for erectile dysfunction. (Patient not taking: Reported on 09/19/2014)   Facility-Administered Encounter Medications as of 04/16/2023  Medication   0.9 %  sodium chloride infusion   carbamide peroxide (DEBROX) 6.5 % otic solution 5 drop    Allergies (verified) Patient has no known allergies.   History: Past Medical History:  Diagnosis Date   Diabetes mellitus without complication (HCC)    GERD (gastroesophageal reflux disease)    Gout    Hx of adenomatous colonic polyps 10/06/2014   Hyperlipidemia     Hypertension    Past Surgical History:  Procedure Laterality Date   BACK SURGERY     COLONOSCOPY     Family History  Problem Relation Age of Onset   Colon cancer Neg Hx    Esophageal cancer Neg Hx    Rectal cancer Neg Hx    Stomach cancer Neg Hx    Colon polyps Neg Hx    Social History   Socioeconomic History   Marital status: Divorced    Spouse name: Not on file   Number of children: Not on file   Years of education: Not on file   Highest education level: Not on file  Occupational History   Not on file  Tobacco Use   Smoking status: Every Day    Packs/day: .5    Types: Cigarettes   Smokeless tobacco: Never  Vaping Use   Vaping Use: Never used  Substance and Sexual Activity   Alcohol use: No    Alcohol/week: 0.0 standard drinks of alcohol   Drug use: No   Sexual activity: Not Currently  Other Topics Concern   Not on file  Social History Narrative   Retired Customer service manager (gas, Mining engineer, cable)   Social Determinants of Health   Financial Resource Strain: Low Risk  (04/16/2023)   Overall Financial Resource Strain (CARDIA)    Difficulty of Paying Living Expenses: Not hard at all  Food Insecurity: No Food Insecurity (04/16/2023)   Hunger Vital Sign    Worried About Running Out of Food in the Last Year: Never true    Ran Out of Food in the Last Year: Never true  Transportation Needs: No Transportation Needs (04/16/2023)   PRAPARE - Administrator, Civil Service (Medical): No    Lack of Transportation (Non-Medical): No  Physical Activity: Sufficiently Active (04/16/2023)   Exercise Vital Sign    Days of Exercise per Week: 5 days    Minutes of Exercise per Session: 30 min  Stress: No Stress Concern Present (04/16/2023)   [employer] of Occupational Health - Occupational Stress Questionnaire    Feeling of Stress : Not at all  Social Connections: Socially Isolated (04/16/2023)   Social Connection and Isolation Panel [NHANES]    Frequency of  Communication with Friends and Family: More than three times a week    Frequency of Social Gatherings with Friends and Family: Three times a week    Attends Religious Services: Never    Active Member of Clubs or Organizations: No    Attends Banker Meetings: Never    Marital Status: Divorced    Tobacco Counseling Ready to quit: Not Answered Counseling given: Not Answered   Clinical Intake:  Pre-visit preparation completed: Yes  Pain : No/denies pain     Diabetes: Yes CBG done?: No Did pt. bring in CBG monitor from home?: No  How often do you need to have someone help you when you read instructions,  pamphlets, or other written materials from your doctor or pharmacy?: 1 - Never  Interpreter Needed?: No  Information entered by :: Kandis Fantasia LPN   Activities of Daily Living    04/16/2023   12:55 PM 06/18/2022    2:51 PM  In your present state of health, do you have any difficulty performing the following activities:  Hearing? 0 0  Vision? 0 0  Difficulty concentrating or making decisions? 0 0  Walking or climbing stairs? 0 0  Dressing or bathing? 0 0  Doing errands, shopping? 0 0  Preparing Food and eating ? N N  Using the Toilet? N N  In the past six months, have you accidently leaked urine? N N  Do you have problems with loss of bowel control? N N  Managing your Medications? N N  Managing your Finances? N N  Housekeeping or managing your Housekeeping? N N    Patient Care Team: Hoy Register, MD as PCP - General (Family Medicine) Candelaria Stagers, DPM as Consulting Physician (Podiatry) [facility], [facility]  Indicate any recent Medical Services you may have received from other than [facility] providers in the past year (date may be approximate).     Assessment:   This is a routine wellness examination for Kiley.  Hearing/Vision screen Hearing Screening - Comments:: Denies hearing difficulties    Vision Screening - Comments:: up to date with routine [facility] exams with [facility]    Dietary issues and exercise activities discussed:     Goals Addressed             This Visit's Progress    Remain active and independent         Depression Screen    04/16/2023    1:16 PM 03/18/2023   10:29 AM 09/17/2022    9:31 AM 06/18/2022    2:51 PM 03/12/2022    9:40 AM 02/07/2022    2:04 PM 09/12/2021    9:24 AM  PHQ 2/9 Scores  PHQ - 2 Score 0 0 0 0 0 0 0  PHQ- 9 Score 3 3 0  0 2 0    Fall Risk    04/16/2023    1:17 PM 03/18/2023   10:29 AM 09/17/2022    9:23 AM 06/18/2022    2:51 PM 03/12/2022    9:31 AM  Fall Risk   Falls in the past year? 0 0 0 0 0  Number falls in past yr: 0 0 0 0 0  Injury with Fall? 0 0 0 0 0  Risk for fall due to : No Fall Risks No Fall Risks No Fall Risks No Fall Risks   Follow up Falls prevention discussed;Education provided;Falls evaluation completed   Education provided     MEDICARE RISK AT HOME:  Medicare Risk at Home - 04/16/23 1317     Any stairs in or around the home? No    If so, are there any without handrails? No    Home free of loose throw rugs in walkways, pet beds, electrical cords, etc? Yes    Adequate lighting in your home to reduce risk of falls? Yes    Life alert? No    Use of a cane, walker or w/c? No    Grab bars in the bathroom? Yes    Shower chair or bench in shower? No    Elevated toilet seat or a handicapped toilet? Yes  TIMED UP AND GO:  Was the test performed?  No    Cognitive Function:    06/18/2022    2:53 PM  MMSE - Mini Mental State Exam  Orientation to time 5  Orientation to Place 5  Registration 3  Attention/ Calculation 5  Recall 3  Language- name 2 objects 2  Language- repeat 1  Language- follow 3 step command 3  Language- read & follow direction 1  Write a sentence 1  Copy design 1  Total score 30        04/16/2023    1:17 PM 06/18/2022    2:53 PM  6CIT Screen  What  Year? 0 points 0 points  What month? 0 points 0 points  What time? 0 points 0 points  Count back from 20 0 points 0 points  Months in reverse 0 points 0 points  Repeat phrase 0 points 0 points  Total Score 0 points 0 points    Immunizations Immunization History  Administered Date(s) Administered   Influenza,inj,Quad PF,6+ Mos 10/12/2013, 06/03/2016, 06/19/2018, 07/07/2019, 09/06/2020, 09/12/2021   PNEUMOCOCCAL CONJUGATE-20 09/12/2021   PPD Test 07/20/2018   Pneumococcal Polysaccharide-23 05/29/2015   Tdap 06/03/2016    TDAP status: Up to date  Pneumococcal vaccine status: Up to date  Covid-19 vaccine status: Information provided on how to obtain vaccines.   Qualifies for Shingles Vaccine? Yes   Zostavax completed No   Shingrix Completed?: No.    Education has been provided regarding the importance of this vaccine. Patient has been advised to call insurance company to determine out of pocket expense if they have not yet received this vaccine. Advised may also receive vaccine at local pharmacy or Health Dept. Verbalized acceptance and understanding.  Screening Tests Health Maintenance  Topic Date Due   COVID-19 Vaccine (1) 05/02/2023 (Originally 05/23/1966)   Zoster Vaccines- Shingrix (1 of 2) 07/17/2023 (Originally 05/24/1979)   INFLUENZA VACCINE  05/29/2023   Diabetic kidney evaluation - eGFR measurement  09/18/2023   Diabetic kidney evaluation - Urine ACR  09/18/2023   HEMOGLOBIN A1C  09/18/2023   Lung Cancer Screening  10/24/2023   OPHTHALMOLOGY EXAM  01/13/2024   FOOT EXAM  03/17/2024   Medicare Annual Wellness (AWV)  04/15/2024   Colonoscopy  05/18/2024   DTaP/Tdap/Td (2 - Td or Tdap) 06/03/2026   Hepatitis C Screening  Completed   HIV Screening  Completed   HPV VACCINES  Aged Out    Health Maintenance  There are no preventive care reminders to display for this patient.   Colorectal cancer screening: Type of screening: Colonoscopy. Completed 05/19/19. Repeat  every 5 years  Lung Cancer Screening: (Low Dose CT Chest recommended if Age 36-80 years, 20 pack-year currently smoking OR have quit w/in 15years.) does qualify.   Lung Cancer Screening Referral: last 10/23/22  Additional Screening:  Hepatitis C Screening: does qualify; Completed 06/03/16  Vision Screening: Recommended annual ophthalmology exams for early detection of glaucoma and other disorders of the eye. Is the patient up to date with their annual eye exam?  Yes  Who is the provider or what is the name of the office in which the patient attends annual eye exams? [facility] If pt is not established with a provider, would they like to be referred to a provider to establish care? No .   Dental Screening: Recommended annual dental exams for proper oral hygiene  Diabetic Foot Exam: Diabetic Foot Exam: Completed 03/18/23  Community Resource Referral / Chronic Care Management:  CRR required this visit?  No   CCM required this visit?  No     Plan:     I have personally reviewed and noted the following in the patient's chart:   Medical and social history Use of alcohol, tobacco or illicit drugs  Current medications and supplements including opioid prescriptions. Patient is not currently taking opioid prescriptions. Functional ability and status Nutritional status Physical activity Advanced directives List of other physicians Hospitalizations, surgeries, and ER visits in previous 12 months Vitals Screenings to include cognitive, depression, and falls Referrals and appointments  In addition, I have reviewed and discussed with patient certain preventive protocols, quality metrics, and best practice recommendations. A written personalized care plan for preventive services as well as general preventive health recommendations were provided to patient.     Kandis Fantasia [place], [place]   1/61/0960   After Visit Summary: (MyChart) Due to this being a telephonic visit, the after  visit summary with patients personalized plan was offered to patient via MyChart   Nurse Notes: No concerns

## 2023-04-16 NOTE — Patient Instructions (Addendum)
[name] , Thank you for taking time to come for your Medicare Wellness Visit. I appreciate your ongoing commitment to your health goals. Please review the following plan we discussed and let me know if I can assist you in the future.   These are the goals we discussed:  Goals      Remain active and independent        This is a list of the screening recommended for you and due dates:  Health Maintenance  Topic Date Due   COVID-19 Vaccine (1) 05/02/2023*   Zoster (Shingles) Vaccine (1 of 2) 07/17/2023*   Flu Shot  05/29/2023   Yearly kidney function blood test for diabetes  09/18/2023   Yearly kidney health urinalysis for diabetes  09/18/2023   Hemoglobin A1C  09/18/2023   Screening for Lung Cancer  10/24/2023   Eye exam for diabetics  01/13/2024   Complete foot exam   03/17/2024   Medicare Annual Wellness Visit  04/15/2024   Colon Cancer Screening  05/18/2024   DTaP/Tdap/Td vaccine (2 - Td or Tdap) 06/03/2026   Hepatitis C Screening  Completed   HIV Screening  Completed   HPV Vaccine  Aged Out  *Topic was postponed. The date shown is not the original due date.    Advanced directives: Information on Advanced Care Planning can be found at [facility] Advance Health Care Directives Advance Health Care Directives ([email or web address] Please bring a copy of your health care power of attorney and living will to the office to be added to your chart at your convenience.  Conditions/risks identified: Aim for 30 minutes of exercise or brisk walking, 6-8 glasses of water, and 5 servings of fruits and vegetables each day  Next appointment: Follow up in one year for your annual wellness visit   Preventive Care 40-64 Years, Male Preventive care refers to lifestyle choices and visits with your health care provider that can promote health and wellness. What does preventive care include? A yearly physical exam. This is also called an annual well check. Dental exams once or  twice a year. Routine eye exams. Ask your health care provider how often you should have your eyes checked. Personal lifestyle choices, including: Daily care of your teeth and gums. Regular physical activity. Eating a healthy diet. Avoiding tobacco and drug use. Limiting alcohol use. Practicing safe sex. Taking low-dose aspirin every day starting at age 17. What happens during an annual well check? The services and screenings done by your health care provider during your annual well check will depend on your age, overall health, lifestyle risk factors, and family history of disease. Counseling  Your health care provider may ask you questions about your: Alcohol use. Tobacco use. Drug use. Emotional well-being. Home and relationship well-being. Sexual activity. Eating habits. Work and work Astronomer. Screening  You may have the following tests or measurements: Height, weight, and BMI. Blood pressure. Lipid and cholesterol levels. These may be checked every 5 years, or more frequently if you are over 64 years old. Skin check. Lung cancer screening. You may have this screening every year starting at age 70 if you have a 30-pack-year history of smoking and currently smoke or have quit within the past 15 years. Fecal occult blood test (FOBT) of the stool. You may have this test every year starting at age 47. Flexible sigmoidoscopy or colonoscopy. You may have a sigmoidoscopy every 5 years or a colonoscopy every 10 years starting at age 48. Prostate cancer  screening. Recommendations will vary depending on your family history and other risks. Hepatitis C blood test. Hepatitis B blood test. Sexually transmitted disease (STD) testing. Diabetes screening. This is done by checking your blood sugar (glucose) after you have not eaten for a while (fasting). You may have this done every 1-3 years. Discuss your test results, treatment options, and if necessary, the need for more tests with your  health care provider. Vaccines  Your health care provider may recommend certain vaccines, such as: Influenza vaccine. This is recommended every year. Tetanus, diphtheria, and acellular pertussis (Tdap, Td) vaccine. You may need a Td booster every 10 years. Zoster vaccine. You may need this after age 66. Pneumococcal 13-valent conjugate (PCV13) vaccine. You may need this if you have certain conditions and have not been vaccinated. Pneumococcal polysaccharide (PPSV23) vaccine. You may need one or two doses if you smoke cigarettes or if you have certain conditions. Talk to your health care provider about which screenings and vaccines you need and how often you need them. This information is not intended to replace advice given to you by your health care provider. Make sure you discuss any questions you have with your health care provider. Document Released: 11/10/2015 Document Revised: 07/03/2016 Document Reviewed: 08/15/2015 Elsevier Interactive Patient Education  2017 [employer].  Fall Prevention in the Home Falls can cause injuries. They can happen to people of all ages. There are many things you can do to make your home safe and to help prevent falls. What can I do on the outside of my home? Regularly fix the edges of walkways and driveways and fix any cracks. Remove anything that might make you trip as you walk through a door, such as a raised step or threshold. Trim any bushes or trees on the path to your home. Use bright outdoor lighting. Clear any walking paths of anything that might make someone trip, such as rocks or tools. Regularly check to see if handrails are loose or broken. Make sure that both sides of any steps have handrails. Any raised decks and porches should have guardrails on the edges. Have any leaves, snow, or ice cleared regularly. Use sand or salt on walking paths during winter. Clean up any spills in your garage right away. This includes oil or grease spills. What  can I do in the bathroom? Use night lights. Install grab bars by the toilet and in the tub and shower. Do not use towel bars as grab bars. Use non-skid mats or decals in the tub or shower. If you need to sit down in the shower, use a plastic, non-slip stool. Keep the floor dry. Clean up any water that spills on the floor as soon as it happens. Remove soap buildup in the tub or shower regularly. Attach bath mats securely with double-sided non-slip rug tape. Do not have throw rugs and other things on the floor that can make you trip. What can I do in the bedroom? Use night lights. Make sure that you have a light by your bed that is easy to reach. Do not use any sheets or blankets that are too big for your bed. They should not hang down onto the floor. Have a firm chair that has side arms. You can use this for support while you get dressed. Do not have throw rugs and other things on the floor that can make you trip. What can I do in the kitchen? Clean up any spills right away. Avoid walking on wet floors.  Keep items that you use a lot in easy-to-reach places. If you need to reach something above you, use a strong step stool that has a grab bar. Keep electrical cords out of the way. Do not use floor polish or wax that makes floors slippery. If you must use wax, use non-skid floor wax. Do not have throw rugs and other things on the floor that can make you trip. What can I do with my stairs? Do not leave any items on the stairs. Make sure that there are handrails on both sides of the stairs and use them. Fix handrails that are broken or loose. Make sure that handrails are as long as the stairways. Check any carpeting to make sure that it is firmly attached to the stairs. Fix any carpet that is loose or worn. Avoid having throw rugs at the top or bottom of the stairs. If you do have throw rugs, attach them to the floor with carpet tape. Make sure that you have a light switch at the top of the  stairs and the bottom of the stairs. If you do not have them, ask someone to add them for you. What else can I do to help prevent falls? Wear shoes that: Do not have high heels. Have rubber bottoms. Are comfortable and fit you well. Are closed at the toe. Do not wear sandals. If you use a stepladder: Make sure that it is fully opened. Do not climb a closed stepladder. Make sure that both sides of the stepladder are locked into place. Ask someone to hold it for you, if possible. Clearly mark and make sure that you can see: Any grab bars or handrails. First and last steps. Where the edge of each step is. Use tools that help you move around (mobility aids) if they are needed. These include: Canes. Walkers. Scooters. Crutches. Turn on the lights when you go into a dark area. Replace any light bulbs as soon as they burn out. Set up your furniture so you have a clear path. Avoid moving your furniture around. If any of your floors are uneven, fix them. If there are any pets around you, be aware of where they are. Review your medicines with your doctor. Some medicines can make you feel dizzy. This can increase your chance of falling. Ask your doctor what other things that you can do to help prevent falls. This information is not intended to replace advice given to you by your health care provider. Make sure you discuss any questions you have with your health care provider. Document Released: 08/10/2009 Document Revised: 03/21/2016 Document Reviewed: 11/18/2014 Elsevier Interactive Patient Education  2017 [employer].

## 2023-04-17 ENCOUNTER — Other Ambulatory Visit: Payer: Self-pay | Admitting: Family Medicine

## 2023-04-17 DIAGNOSIS — M1A0710 Idiopathic chronic gout, right ankle and foot, without tophus (tophi): Secondary | ICD-10-CM

## 2023-04-18 ENCOUNTER — Other Ambulatory Visit: Payer: Self-pay | Admitting: Family Medicine

## 2023-04-18 DIAGNOSIS — K219 Gastro-esophageal reflux disease without esophagitis: Secondary | ICD-10-CM

## 2023-04-18 NOTE — Telephone Encounter (Signed)
Unable to refill per protocol, Rx request is too soon.  Requested Prescriptions  Pending Prescriptions Disp Refills   pantoprazole (PROTONIX) 40 MG tablet [Pharmacy Med Name: PANTOPRAZOLE 40MG  TABLETS] 90 tablet 1    Sig: TAKE 1 TABLET(40 MG) BY MOUTH DAILY     Gastroenterology: Proton Pump Inhibitors Passed - 04/18/2023 12:55 PM      Passed - Valid encounter within last 12 months    Recent Outpatient Visits           1 month ago Type 2 diabetes mellitus with other specified complication, without long-term current use of insulin (HCC)   [place] [facility] [place], [place], [place]   7 months ago Type 2 diabetes mellitus with other specified complication, without long-term current use of insulin (HCC)   [place] [facility] [place], [place], [place]   1 year ago Type 2 diabetes mellitus with other specified complication, without long-term current use of insulin (HCC)   [place] [facility] [place], [name], MD   1 year ago Type 2 diabetes mellitus with other specified complication, without long-term current use of insulin [facility])   [place] [facility] [place], [place], [place]   1 year ago Hypertension associated with diabetes [facility])   [place] [facility] [place], Cornelius Moras, RPH-CPP       Future Appointments             In 5 months Hoy Register, MD [facility]

## 2023-04-23 DIAGNOSIS — R3914 Feeling of incomplete bladder emptying: Secondary | ICD-10-CM | POA: Diagnosis not present

## 2023-04-30 NOTE — Telephone Encounter (Signed)
Pt called back reporting that his insurance will only cover 30 days and not 90 days for the prescription. He is asking for a 30 day supply to get submitted. Please advise   [id number] [place], [place] - [address]  [address] [place] [address]  Phone: [phone] Fax: [phone]

## 2023-04-30 NOTE — Telephone Encounter (Addendum)
Rx not prescribed by PCP. Would need to f/u with provider that prescribed medication to send refill.  Message to [facility]

## 2023-05-12 ENCOUNTER — Encounter: Payer: Self-pay | Admitting: Physician Assistant

## 2023-05-12 ENCOUNTER — Ambulatory Visit: Payer: 59 | Admitting: Physician Assistant

## 2023-05-12 DIAGNOSIS — J0110 Acute frontal sinusitis, unspecified: Secondary | ICD-10-CM | POA: Diagnosis not present

## 2023-05-12 MED ORDER — AMOXICILLIN-POT CLAVULANATE 875-125 MG PO TABS: 1.0000 | ORAL_TABLET | Freq: Two times a day (BID) | ORAL | 0 refills | Status: DC

## 2023-05-12 NOTE — Patient Instructions (Signed)
You are going to take Augmentin twice a day for 10 days.  I encourage you to continue using Flonase and I strongly encourage you to start taking Zyrtec on a daily basis even when you start feeling better.  This is going to help prevent recurrence of sinus infections.  You will purchase Zyrtec over-the-counter, the generic version is perfectly fine.  I hope that you feel better soon.  Please let us know if there is anything else we can do for you.  Roney Jaffe, PA-C Physician Assistant [facility] [email or web address]   Sinus Infection, Adult A sinus infection, also called sinusitis, is inflammation of your sinuses. Sinuses are hollow spaces in the bones around your face. Your sinuses are located: Around your eyes. In the middle of your forehead. Behind your nose. In your cheekbones. Mucus normally drains out of your sinuses. When your nasal tissues become inflamed or swollen, mucus can become trapped or blocked. This allows bacteria, viruses, and fungi to grow, which leads to infection. Most infections of the sinuses are caused by a virus. A sinus infection can develop quickly. It can last for up to 4 weeks (acute) or for more than 12 weeks (chronic). A sinus infection often develops after a cold. What are the causes? This condition is caused by anything that creates swelling in the sinuses or stops mucus from draining. This includes: Allergies. Asthma. Infection from bacteria or viruses. Deformities or blockages in your nose or sinuses. Abnormal growths in the nose (nasal polyps). Pollutants, such as chemicals or irritants in the air. Infection from fungi. This is rare. What increases the risk? You are more likely to develop this condition if you: Have a weak body defense system (immune system). Do a lot of swimming or diving. Overuse nasal sprays. Smoke. What are the signs or symptoms? The main symptoms of this condition  are pain and a feeling of pressure around the affected sinuses. Other symptoms include: Stuffy nose or congestion that makes it difficult to breathe through your nose. Thick yellow or greenish drainage from your nose. Tenderness, swelling, and warmth over the affected sinuses. A cough that may get worse at night. Decreased sense of smell and taste. Extra mucus that collects in the throat or the back of the nose (postnasal drip) causing a sore throat or bad breath. Tiredness (fatigue). Fever. How is this diagnosed? This condition is diagnosed based on: Your symptoms. Your medical history. A physical exam. Tests to find out if your condition is acute or chronic. This may include: Checking your nose for nasal polyps. Viewing your sinuses using a device that has a light (endoscope). Testing for allergies or bacteria. Imaging tests, such as an MRI or CT scan. In rare cases, a bone biopsy may be done to rule out more serious types of fungal sinus disease. How is this treated? Treatment for a sinus infection depends on the cause and whether your condition is chronic or acute. If caused by a virus, your symptoms should go away on their own within 10 days. You may be given medicines to relieve symptoms. They include: Medicines that shrink swollen nasal passages (decongestants). A spray that eases inflammation of the nostrils (topical intranasal corticosteroids). Rinses that help get rid of thick mucus in your nose (nasal saline washes). Medicines that treat allergies (antihistamines). Over-the-counter pain relievers. If caused by bacteria, your health care provider may recommend waiting to see if your symptoms improve. Most bacterial infections will get better without antibiotic medicine. You may  be given antibiotics if you have: A severe infection. A weak immune system. If caused by narrow nasal passages or nasal polyps, surgery may be needed. Follow these instructions at  home: Medicines Take, use, or apply over-the-counter and prescription medicines only as told by your health care provider. These may include nasal sprays. If you were prescribed an antibiotic medicine, take it as told by your health care provider. Do not stop taking the antibiotic even if you start to feel better. Hydrate and humidify  Drink enough fluid to keep your urine pale yellow. Staying hydrated will help to thin your mucus. Use a cool mist humidifier to keep the humidity level in your home above 50%. Inhale steam for 10-15 minutes, 3-4 times a day, or as told by your health care provider. You can do this in the bathroom while a hot shower is running. Limit your exposure to cool or dry air. Rest Rest as much as possible. Sleep with your head raised (elevated). Make sure you get enough sleep each night. General instructions  Apply a warm, moist washcloth to your face 3-4 times a day or as told by your health care provider. This will help with discomfort. Use nasal saline washes as often as told by your health care provider. Wash your hands often with soap and water to reduce your exposure to germs. If soap and water are not available, use hand sanitizer. Do not smoke. Avoid being around people who are smoking (secondhand smoke). Keep all follow-up visits. This is important. Contact a health care provider if: You have a fever. Your symptoms get worse. Your symptoms do not improve within 10 days. Get help right away if: You have a severe headache. You have persistent vomiting. You have severe pain or swelling around your face or eyes. You have vision problems. You develop confusion. Your neck is stiff. You have trouble breathing. These symptoms may be an emergency. Get help right away. Call 911. Do not wait to see if the symptoms will go away. Do not drive yourself to the hospital. Summary A sinus infection is soreness and inflammation of your sinuses. Sinuses are hollow  spaces in the bones around your face. This condition is caused by nasal tissues that become inflamed or swollen. The swelling traps or blocks the flow of mucus. This allows bacteria, viruses, and fungi to grow, which leads to infection. If you were prescribed an antibiotic medicine, take it as told by your health care provider. Do not stop taking the antibiotic even if you start to feel better. Keep all follow-up visits. This is important. This information is not intended to replace advice given to you by your health care provider. Make sure you discuss any questions you have with your health care provider. Document Revised: 09/18/2021 Document Reviewed: 09/18/2021 Elsevier Patient Education  2024 [employer].

## 2023-05-12 NOTE — Progress Notes (Signed)
Established Patient Office Visit  Subjective   Patient ID: Henry Walker, male    DOB: 1960-12-11  Age: 62 y.o. MRN: [id number]  Chief Complaint  Patient presents with   Sinusitis    Symptoms started last Tuesday    States that he has been experiencing sinus pressure, sinus headache, pain in both ears for the past 7 days.  Feels like his symptoms are becoming worse.  Endorses clear nasal drainage.  States that he has been using Flonase and Vicks mucus relief without much relief.  Denies sick contacts, states that his air conditioner had broken and had to sleep with a fan and started feeling poorly the next day.      Past Medical History:  Diagnosis Date   Diabetes mellitus without complication (HCC)    GERD (gastroesophageal reflux disease)    Gout    Hx of adenomatous colonic polyps 10/06/2014   Hyperlipidemia    Hypertension    Social History   Socioeconomic History   Marital status: Divorced    Spouse name: Not on file   Number of children: Not on file   Years of education: Not on file   Highest education level: Not on file  Occupational History   Not on file  Tobacco Use   Smoking status: Every Day    Current packs/day: 0.50    Types: Cigarettes   Smokeless tobacco: Never  Vaping Use   Vaping status: Never Used  Substance and Sexual Activity   Alcohol use: No    Alcohol/week: 0.0 standard drinks of alcohol   Drug use: No   Sexual activity: Not Currently  Other Topics Concern   Not on file  Social History Narrative   Retired Customer service manager (gas, Mining engineer, cable)   Social Determinants of Health   Financial Resource Strain: Low Risk  (04/16/2023)   Overall Financial Resource Strain (CARDIA)    Difficulty of Paying Living Expenses: Not hard at all  Food Insecurity: No Food Insecurity (04/16/2023)   Hunger Vital Sign    Worried About Running Out of Food in the Last Year: Never true    Ran Out of Food in the Last Year: Never true   Transportation Needs: No Transportation Needs (04/16/2023)   PRAPARE - Administrator, Civil Service (Medical): No    Lack of Transportation (Non-Medical): No  Physical Activity: Sufficiently Active (04/16/2023)   Exercise Vital Sign    Days of Exercise per Week: 5 days    Minutes of Exercise per Session: 30 min  Stress: No Stress Concern Present (04/16/2023)   [employer] of Occupational Health - Occupational Stress Questionnaire    Feeling of Stress : Not at all  Social Connections: Socially Isolated (04/16/2023)   Social Connection and Isolation Panel [NHANES]    Frequency of Communication with Friends and Family: More than three times a week    Frequency of Social Gatherings with Friends and Family: Three times a week    Attends Religious Services: Never    Active Member of Clubs or Organizations: No    Attends Banker Meetings: Never    Marital Status: Divorced  Catering manager Violence: Not At Risk (04/16/2023)   Humiliation, Afraid, Rape, and Kick questionnaire    Fear of Current or Ex-Partner: No    Emotionally Abused: No    Physically Abused: No    Sexually Abused: No   Family History  Problem Relation Age of Onset   Colon cancer Neg Hx  Esophageal cancer Neg Hx    Rectal cancer Neg Hx    Stomach cancer Neg Hx    Colon polyps Neg Hx    No Known Allergies  Review of Systems  Constitutional:  Negative for chills and fever.  HENT:  Positive for congestion, ear pain and sinus pain. Negative for sore throat.   Eyes: Negative.   Respiratory:  Negative for cough, shortness of breath and wheezing.   Cardiovascular:  Negative for chest pain.  Gastrointestinal:  Negative for nausea and vomiting.  Genitourinary: Negative.   Musculoskeletal: Negative.   Skin: Negative.   Neurological:  Positive for headaches.  Endo/Heme/Allergies: Negative.   Psychiatric/Behavioral: Negative.        Objective:     BP 113/75 (BP Location: Left Arm,  Patient Position: Sitting, Cuff Size: Large)   Pulse 82   Ht 5\' 7"  (1.702 m)   Wt 170 lb (77.1 kg)   SpO2 96%   BMI 26.63 kg/m  BP Readings from Last 3 Encounters:  05/12/23 113/75  03/18/23 (!) 144/77  09/17/22 (!) 145/60   Wt Readings from Last 3 Encounters:  05/12/23 170 lb (77.1 kg)  04/16/23 174 lb (78.9 kg)  03/18/23 174 lb 12.8 oz (79.3 kg)      Physical Exam Vitals and nursing note reviewed.  Constitutional:      Appearance: Normal appearance.  HENT:     Head: Normocephalic.     Salivary Glands: Right salivary gland is diffusely enlarged and tender. Left salivary gland is diffusely enlarged and tender.     Right Ear: Tympanic membrane, ear canal and external ear normal.     Left Ear: Tympanic membrane, ear canal and external ear normal.     Nose:     Right Turbinates: Enlarged and swollen.     Left Turbinates: Enlarged and swollen.     Right Sinus: Maxillary sinus tenderness present.     Left Sinus: Maxillary sinus tenderness present.     Mouth/Throat:     Lips: Pink.     Mouth: Mucous membranes are moist.     Pharynx: Oropharynx is clear.  Cardiovascular:     Rate and Rhythm: Normal rate and regular rhythm.     Pulses: Normal pulses.     Heart sounds: Normal heart sounds.  Pulmonary:     Effort: Pulmonary effort is normal.     Breath sounds: Normal breath sounds.  Musculoskeletal:        General: Normal range of motion.     Cervical back: Normal range of motion and neck supple.  Skin:    General: Skin is warm and dry.  Neurological:     General: No focal deficit present.     Mental Status: He is alert and oriented to person, place, and time.  Psychiatric:        Mood and Affect: Mood normal.        Behavior: Behavior normal.        Thought Content: Thought content normal.        Judgment: Judgment normal.        Assessment & Plan:   Problem List Items Addressed This Visit   None Visit Diagnoses     Acute non-recurrent frontal sinusitis        Relevant Medications   amoxicillin-clavulanate (AUGMENTIN) 875-125 MG tablet     1. Acute non-recurrent frontal sinusitis Trial Augmentin.  Patient encouraged to continue Flonase, strongly encouraged to take Zyrtec on a daily basis due to  history of recurrent sinus infections.  Patient understands and agrees.  Patient education given on supportive care, red flags given for prompt reevaluation - amoxicillin-clavulanate (AUGMENTIN) 875-125 MG tablet; Take 1 tablet by mouth 2 (two) times daily.  Dispense: 20 tablet; Refill: 0    I have reviewed the patient's medical history (PMH, PSH, Social History, Family History, Medications, and allergies) , and have been updated if relevant. I spent 20 minutes reviewing chart and  face to face time with patient.   Return if symptoms worsen or fail to improve.    Kasandra Knudsen Mayers, PA-C

## 2023-05-29 DIAGNOSIS — C61 Malignant neoplasm of prostate: Secondary | ICD-10-CM

## 2023-05-29 HISTORY — DX: Malignant neoplasm of prostate: C61

## 2023-06-20 DIAGNOSIS — R3914 Feeling of incomplete bladder emptying: Secondary | ICD-10-CM | POA: Diagnosis not present

## 2023-07-02 DIAGNOSIS — R3914 Feeling of incomplete bladder emptying: Secondary | ICD-10-CM | POA: Diagnosis not present

## 2023-07-14 NOTE — Progress Notes (Signed)
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 {:18581} Pacemaker/ICD? {:18581} Possible current pregnancy? Male Is the patient on methotrexate? No  Current Complaints / other details:

## 2023-07-15 ENCOUNTER — Encounter: Payer: Self-pay | Admitting: Radiation Oncology

## 2023-07-15 ENCOUNTER — Ambulatory Visit
Admission: RE | Admit: 2023-07-15 | Discharge: 2023-07-15 | Disposition: A | Discharge status: Home / Self Care | Payer: 59 | Source: Ambulatory Visit | Attending: Radiation Oncology | Admitting: Radiation Oncology

## 2023-07-15 VITALS — BP 126/73 | HR 69 | Temp 97.3°F | Resp 18 | Ht 67.0 in | Wt 172.2 lb

## 2023-07-15 DIAGNOSIS — I10 Essential (primary) hypertension: Secondary | ICD-10-CM | POA: Diagnosis not present

## 2023-07-15 DIAGNOSIS — M109 Gout, unspecified: Secondary | ICD-10-CM | POA: Insufficient documentation

## 2023-07-15 DIAGNOSIS — Z191 Hormone sensitive malignancy status: Secondary | ICD-10-CM | POA: Diagnosis not present

## 2023-07-15 DIAGNOSIS — K219 Gastro-esophageal reflux disease without esophagitis: Secondary | ICD-10-CM | POA: Insufficient documentation

## 2023-07-15 DIAGNOSIS — Z79899 Other long term (current) drug therapy: Secondary | ICD-10-CM | POA: Insufficient documentation

## 2023-07-15 DIAGNOSIS — Z86010 Personal history of colonic polyps: Secondary | ICD-10-CM | POA: Insufficient documentation

## 2023-07-15 DIAGNOSIS — F17210 Nicotine dependence, cigarettes, uncomplicated: Secondary | ICD-10-CM | POA: Diagnosis not present

## 2023-07-15 DIAGNOSIS — C61 Malignant neoplasm of prostate: Secondary | ICD-10-CM | POA: Insufficient documentation

## 2023-07-15 DIAGNOSIS — E119 Type 2 diabetes mellitus without complications: Secondary | ICD-10-CM | POA: Insufficient documentation

## 2023-07-15 DIAGNOSIS — E785 Hyperlipidemia, unspecified: Secondary | ICD-10-CM | POA: Diagnosis not present

## 2023-07-15 DIAGNOSIS — Z7984 Long term (current) use of oral hypoglycemic drugs: Secondary | ICD-10-CM | POA: Diagnosis not present

## 2023-07-15 NOTE — Progress Notes (Signed)
Introduced myself to the patient as the prostate nurse navigator.  He is here to discuss his radiation treatment options and will proceed with 5.5 weeks of daily radiation.  Patient could benefit from financial assistance during treatment, once treatment starts I will refer him for the [employer].  I gave him my business card and asked him to call me with questions or concerns.  Verbalized understanding.

## 2023-07-15 NOTE — Progress Notes (Signed)
Radiation Oncology         [phone] ________________________________  Initial Outpatient Consultation  Name: Henry Walker MRN: [id number]  Date: 07/15/2023  DOB: 12/10/1960  NW:GNFAOZ, [name], MD  Noel Christmas, MD   REFERRING PHYSICIAN: Noel Christmas, MD  DIAGNOSIS: 62 y.o. gentleman with Stage T1c adenocarcinoma of the prostate with Gleason score of 3+4, and PSA of 6.9.    ICD-10-CM   1. Malignant neoplasm of prostate (HCC)  C61       HISTORY OF PRESENT ILLNESS: Henry Walker is a 62 y.o. male with a diagnosis of prostate cancer. He was noted to have an elevated PSA of 6.8 by his primary care physician, Dr. Alvis Lemmings.  Accordingly, he was referred for evaluation in urology by Dr. Arita Miss on 04/23/23. Digital rectal examination was performed at that time showing a prominent central sulcus with slight asymmetry;right larger than left, but no discrete nodules. A repeat PSA was obtained that day and remained stable elevated at 6.88. The patient proceeded to transrectal ultrasound with 12 biopsies of the prostate on 06/20/23.  The prostate volume measured 78.6 cc.  Out of 12 core biopsies, only 1 was positive.  The maximum Gleason score was 3+4, and this was seen in left base lateral (10%).  The patient reviewed the biopsy results with his urologist and he has kindly been referred today for discussion of potential radiation treatment options.   PREVIOUS RADIATION THERAPY: No  PAST MEDICAL HISTORY:  Past Medical History:  Diagnosis Date   Diabetes mellitus without complication (HCC)    GERD (gastroesophageal reflux disease)    Gout    Hx of adenomatous colonic polyps 10/06/2014   Hyperlipidemia    Hypertension       PAST SURGICAL HISTORY: Past Surgical History:  Procedure Laterality Date   BACK SURGERY     COLONOSCOPY     PROSTATE BIOPSY      FAMILY HISTORY:  Family History  Problem Relation Age of Onset   Hypertension Mother    Cirrhosis Father     Hypertension Sister    Stroke Brother    Colon cancer Neg Hx    Esophageal cancer Neg Hx    Rectal cancer Neg Hx    Stomach cancer Neg Hx    Colon polyps Neg Hx     SOCIAL HISTORY:  Social History   Socioeconomic History   Marital status: Divorced    Spouse name: Not on file   Number of children: Not on file   Years of education: Not on file   Highest education level: Not on file  Occupational History   Not on file  Tobacco Use   Smoking status: Every Day    Current packs/day: 0.50    Types: Cigarettes   Smokeless tobacco: Never  Vaping Use   Vaping status: Never Used  Substance and Sexual Activity   Alcohol use: No    Alcohol/week: 0.0 standard drinks of alcohol   Drug use: No   Sexual activity: Not Currently  Other Topics Concern   Not on file  Social History Narrative   Retired Customer service manager (gas, Mining engineer, cable)   Social Determinants of Health   Financial Resource Strain: Low Risk  (04/16/2023)   Overall Financial Resource Strain (CARDIA)    Difficulty of Paying Living Expenses: Not hard at all  Food Insecurity: Food Insecurity Present (07/15/2023)   Hunger Vital Sign    Worried About Running Out of Food in the Last Year: Often  true    Ran Out of Food in the Last Year: Sometimes true  Transportation Needs: No Transportation Needs (07/15/2023)   PRAPARE - Administrator, Civil Service (Medical): No    Lack of Transportation (Non-Medical): No  Physical Activity: Sufficiently Active (04/16/2023)   Exercise Vital Sign    Days of Exercise per Week: 5 days    Minutes of Exercise per Session: 30 min  Stress: No Stress Concern Present (04/16/2023)   [employer] of Occupational Health - Occupational Stress Questionnaire    Feeling of Stress : Not at all  Social Connections: Socially Isolated (04/16/2023)   Social Connection and Isolation Panel [NHANES]    Frequency of Communication with Friends and Family: More than three times a week     Frequency of Social Gatherings with Friends and Family: Three times a week    Attends Religious Services: Never    Active Member of Clubs or Organizations: No    Attends Banker Meetings: Never    Marital Status: Divorced  Catering manager Violence: Not At Risk (07/15/2023)   Humiliation, Afraid, Rape, and Kick questionnaire    Fear of Current or Ex-Partner: No    Emotionally Abused: No    Physically Abused: No    Sexually Abused: No    ALLERGIES: Patient has no known allergies.  MEDICATIONS:  Current Outpatient Medications  Medication Sig Dispense Refill   albuterol (VENTOLIN HFA) 108 (90 Base) MCG/ACT inhaler Inhale 2 puffs into the lungs every 6 (six) hours as needed for wheezing or shortness of breath. 8 g 2   allopurinol (ZYLOPRIM) 100 MG tablet TAKE 1 TABLET(100 MG) BY MOUTH DAILY 90 tablet 1   amLODipine (NORVASC) 10 MG tablet Take 1 tablet (10 mg total) by mouth daily. 90 tablet 1   atorvastatin (LIPITOR) 80 MG tablet TAKE 1 TABLET BY MOUTH EVERY DAY 90 tablet 1   Blood Glucose Monitoring Suppl (ONE TOUCH ULTRA 2) w/Device KIT Use as directed 3 times daily before meals. 1 kit 0   carvedilol (COREG) 12.5 MG tablet Take 1 tablet (12.5 mg total) by mouth 2 (two) times daily with a meal. 180 tablet 1   cetirizine (ZYRTEC ALLERGY) 10 MG tablet Take 1 tablet (10 mg total) by mouth daily. 30 tablet 11   clotrimazole (LOTRIMIN) 1 % cream Apply 1 application topically 2 (two) times daily. 30 g 0   fluticasone (FLONASE) 50 MCG/ACT nasal spray Place 2 sprays into both nostrils daily. 16 g 6   glucose blood (ONETOUCH ULTRA) test strip Use as instructed 3 times daily 100 each 12   Lancet Devices (ACCU-CHEK SOFTCLIX) lancets Use as instructed 1 each 0   Lancets (ONETOUCH ULTRASOFT) lancets Use as instructed 3 times daily 100 each 12   metFORMIN (GLUCOPHAGE-XR) 500 MG 24 hr tablet TAKE 1 TABLET(500 MG) BY MOUTH DAILY WITH BREAKFAST 90 tablet 1   pantoprazole (PROTONIX) 40 MG  tablet Take 1 tablet (40 mg total) by mouth daily. 90 tablet 1   traZODone (DESYREL) 50 MG tablet Take 1 tablet (50 mg total) by mouth at bedtime as needed for sleep. 90 tablet 1   valsartan-hydrochlorothiazide (DIOVAN-HCT) 320-25 MG tablet Take 1 tablet by mouth daily. 90 tablet 1   Current Facility-Administered Medications  Medication Dose Route Frequency Provider Last Rate Last Admin   0.9 %  sodium chloride infusion  500 mL Intravenous Once Iva Boop, MD       carbamide peroxide (DEBROX) 6.5 % otic  solution 5 drop  5 drop Left EAR Once Pete Glatter, MD        REVIEW OF SYSTEMS:  On review of systems, the patient reports that he is doing well overall. He denies any chest pain, shortness of breath, cough, fevers, chills, night sweats, unintended weight changes. He denies any bowel disturbances, and denies abdominal pain, nausea or vomiting. He denies any new musculoskeletal or joint aches or pains. His IPSS was 14, indicating moderate urinary symptoms. His SHIM was 9, indicating he has moderate-severe erectile dysfunction. A complete review of systems is obtained and is otherwise negative.    PHYSICAL EXAM:  Wt Readings from Last 3 Encounters:  07/15/23 172 lb 4 oz (78.1 kg)  05/12/23 170 lb (77.1 kg)  04/16/23 174 lb (78.9 kg)   Temp Readings from Last 3 Encounters:  07/15/23 (!) 97.3 F (36.3 C) (Temporal)  09/17/22 98.1 F (36.7 C) (Oral)  06/19/21 99.6 F (37.6 C) (Oral)   BP Readings from Last 3 Encounters:  07/15/23 126/73  05/12/23 113/75  03/18/23 (!) 144/77   Pulse Readings from Last 3 Encounters:  07/15/23 69  05/12/23 82  03/18/23 (!) 54   Pain Assessment Pain Score: 3  Pain Loc: Back/10  In general this is a well appearing African American man in no acute distress. He's alert and oriented x4 and appropriate throughout the examination. Cardiopulmonary assessment is negative for acute distress, and he exhibits normal effort.     KPS = 100  100 -  Normal; no complaints; no evidence of disease. 90   - Able to carry on normal activity; minor signs or symptoms of disease. 80   - Normal activity with effort; some signs or symptoms of disease. 72   - Cares for self; unable to carry on normal activity or to do active work. 60   - Requires occasional assistance, but is able to care for most of his personal needs. 50   - Requires considerable assistance and frequent medical care. 40   - Disabled; requires special care and assistance. 30   - Severely disabled; hospital admission is indicated although death not imminent. 20   - Very sick; hospital admission necessary; active supportive treatment necessary. 10   - Moribund; fatal processes progressing rapidly. 0     - Dead  Karnofsky DA, Abelmann WH, Craver LS and [facility] [phone]) The use of the nitrogen mustards in the palliative treatment of carcinoma: with particular reference to bronchogenic carcinoma Cancer 1 634-56  LABORATORY DATA:  Lab Results  Component Value Date   WBC 9.7 02/07/2022   HGB 15.1 02/07/2022   HCT 44.9 02/07/2022   MCV 81 02/07/2022   PLT 315 02/07/2022   Lab Results  Component Value Date   NA 143 09/17/2022   K 3.9 09/17/2022   CL 102 09/17/2022   CO2 23 09/17/2022   Lab Results  Component Value Date   ALT 27 04/04/2023   AST 21 04/04/2023   ALKPHOS 72 04/04/2023   BILITOT 0.6 04/04/2023     RADIOGRAPHY: No results found.    IMPRESSION/PLAN: 1. 62 y.o. gentleman with Stage T1c adenocarcinoma of the prostate with Gleason Score of 3+4, and PSA of 6.9. We discussed the patient's workup and outlined the nature of prostate cancer in this setting. The patient's T stage, Gleason's score, and PSA put him into the favorable intermediate risk group. Accordingly, he is eligible for a variety of potential treatment options including brachytherapy, 5.5 weeks of  external radiation, or prostatectomy. We also discussed active surveillance in light of his very low  volume, favorable intermediate risk disease. We reviewed the available radiation techniques, and focused on the details and logistics of delivery. The patient is not an ideal candidate for brachytherapy with a prostate volume of 79 cc.  We therefore, we discussed and outlined the risks, benefits, short and long-term effects associated with daily external beam radiotherapy and compared and contrasted these with prostatectomy. We discussed the role of SpaceOAR gel in reducing the rectal toxicity associated with radiotherapy. He was encouraged to ask questions that were answered to his stated satisfaction.  At the conclusion of our conversation, the patient is interested in moving forward with 5.5 weeks of external beam therapy. We will share our discussion with Dr. Arita Miss and make arrangements for fiducial markers and SpaceOAR gel placement, first available, prior to simulation, to reduce rectal toxicity from radiotherapy. The patient appears to have a good understanding of his disease and our treatment recommendations which are of curative intent and is in agreement with the stated plan.  Therefore, we will move forward with treatment planning accordingly, in anticipation of beginning IMRT in the near future. We enjoyed meeting him and look forward to continuing to participate in his care. .  We personally spent 60 minutes in this encounter including chart review, reviewing radiological studies, meeting face-to-face with the patient, entering orders and completing documentation.    Marguarite Arbour, PA-C    Margaretmary Dys, MD  [facility]  Radiation Oncology Direct Dial: [phone]  Fax: [phone] [email or web address]  Skype  [email or web address]   This document serves as a record of services personally performed by Margaretmary Dys, MD and Marcello Fennel, PA-C. It was created on their behalf by Mickie Bail, a trained medical scribe. The creation of this record is based on the scribe's personal  observations and the provider's statements to them. This document has been checked and approved by the attending provider.

## 2023-07-16 ENCOUNTER — Telehealth: Payer: Self-pay | Admitting: *Deleted

## 2023-07-16 ENCOUNTER — Other Ambulatory Visit: Payer: Self-pay | Admitting: Urology

## 2023-07-16 NOTE — Telephone Encounter (Signed)
CALLED PATIENT TO INFORM OF FID. MARKER AND SPACE OAR GEL PLACEMENT ON 07-25-23 AND HIS SIM ON 08-01-23- ARRIVAL TIME- 2:45 PM, INFORMED PATIENT TO ARRIVE WITH A FULL BLADDER, SPOKE WITH PATIENT AND HE IS AWARE OF THESE APPTS. AND THE INSTRUCTIONS

## 2023-07-17 ENCOUNTER — Encounter (HOSPITAL_BASED_OUTPATIENT_CLINIC_OR_DEPARTMENT_OTHER): Payer: Self-pay | Admitting: Urology

## 2023-07-17 NOTE — Progress Notes (Signed)
Spoke w/ via phone for pre-op interview--- pt Lab needs dos----  [employer], ekg       Lab results------ no COVID test -----patient states asymptomatic no test needed Arrive at ------- 0630 on 07-25-2023 NPO after MN NO Solid Food.  Clear liquids from MN until--- 0530 Med rec completed Medications to take morning of surgery ----- coreg, norvasc, protonix Diabetic medication ----- do not take metformin morning of surgery Patient instructed no nail polish to be worn day of surgery Patient instructed to bring photo id and insurance card day of surgery Patient aware to have Driver (ride ) / caregiver    for 24 hours after surgery -- friend, Engineer, production  Patient Special Instructions ----- will do one fleet enema night before surgery Pre-Op special Instructions ----- n/a Patient verbalized understanding of instructions that were given at this phone interview. Patient denies chest pain, sob, fever, cough at the interview.

## 2023-07-25 ENCOUNTER — Encounter (HOSPITAL_BASED_OUTPATIENT_CLINIC_OR_DEPARTMENT_OTHER): Payer: Self-pay | Admitting: Urology

## 2023-07-25 ENCOUNTER — Ambulatory Visit (HOSPITAL_BASED_OUTPATIENT_CLINIC_OR_DEPARTMENT_OTHER): Payer: 59 | Admitting: Certified Registered"

## 2023-07-25 ENCOUNTER — Encounter (HOSPITAL_BASED_OUTPATIENT_CLINIC_OR_DEPARTMENT_OTHER)
Admission: RE | Disposition: A | Discharge status: Home / Self Care | Payer: Self-pay | Source: Ambulatory Visit | Attending: Urology

## 2023-07-25 ENCOUNTER — Other Ambulatory Visit: Payer: Self-pay

## 2023-07-25 ENCOUNTER — Ambulatory Visit (HOSPITAL_BASED_OUTPATIENT_CLINIC_OR_DEPARTMENT_OTHER)
Admission: RE | Admit: 2023-07-25 | Discharge: 2023-07-25 | Disposition: A | Discharge status: Home / Self Care | Payer: 59 | Source: Ambulatory Visit | Attending: Urology | Admitting: Urology

## 2023-07-25 DIAGNOSIS — Z01818 Encounter for other preprocedural examination: Secondary | ICD-10-CM

## 2023-07-25 DIAGNOSIS — J449 Chronic obstructive pulmonary disease, unspecified: Secondary | ICD-10-CM

## 2023-07-25 DIAGNOSIS — E119 Type 2 diabetes mellitus without complications: Secondary | ICD-10-CM | POA: Insufficient documentation

## 2023-07-25 DIAGNOSIS — Z8546 Personal history of malignant neoplasm of prostate: Secondary | ICD-10-CM | POA: Diagnosis not present

## 2023-07-25 DIAGNOSIS — I10 Essential (primary) hypertension: Secondary | ICD-10-CM | POA: Insufficient documentation

## 2023-07-25 DIAGNOSIS — M1990 Unspecified osteoarthritis, unspecified site: Secondary | ICD-10-CM | POA: Diagnosis not present

## 2023-07-25 DIAGNOSIS — C61 Malignant neoplasm of prostate: Secondary | ICD-10-CM | POA: Insufficient documentation

## 2023-07-25 DIAGNOSIS — F17210 Nicotine dependence, cigarettes, uncomplicated: Secondary | ICD-10-CM | POA: Diagnosis not present

## 2023-07-25 DIAGNOSIS — K219 Gastro-esophageal reflux disease without esophagitis: Secondary | ICD-10-CM | POA: Diagnosis not present

## 2023-07-25 DIAGNOSIS — J439 Emphysema, unspecified: Secondary | ICD-10-CM | POA: Diagnosis not present

## 2023-07-25 DIAGNOSIS — F17200 Nicotine dependence, unspecified, uncomplicated: Secondary | ICD-10-CM | POA: Diagnosis not present

## 2023-07-25 HISTORY — DX: Complete loss of teeth, unspecified cause, unspecified class: K08.109

## 2023-07-25 HISTORY — PX: SPACE OAR INSTILLATION: SHX6769

## 2023-07-25 HISTORY — DX: Benign prostatic hyperplasia with lower urinary tract symptoms: N40.1

## 2023-07-25 HISTORY — DX: Chronic gout, unspecified, without tophus (tophi): M1A.9XX0

## 2023-07-25 HISTORY — DX: Male erectile dysfunction, unspecified: N52.9

## 2023-07-25 HISTORY — DX: Emphysema, unspecified: J43.9

## 2023-07-25 HISTORY — PX: GOLD SEED IMPLANT: SHX6343

## 2023-07-25 HISTORY — DX: Type 2 diabetes mellitus without complications: E11.9

## 2023-07-25 LAB — POCT I-STAT, CHEM 8
BUN: 15 mg/dL (ref 8–23)
Calcium, Ion: 1.16 mmol/L (ref 1.15–1.40)
Chloride: 101 mmol/L (ref 98–111)
Creatinine, Ser: 1.3 mg/dL — ABNORMAL HIGH (ref 0.61–1.24)
Glucose, Bld: 133 mg/dL — ABNORMAL HIGH (ref 70–99)
HCT: 42 % (ref 39.0–52.0)
Hemoglobin: 14.3 g/dL (ref 13.0–17.0)
Potassium: 3.7 mmol/L (ref 3.5–5.1)
Sodium: 141 mmol/L (ref 135–145)
TCO2: 27 mmol/L (ref 22–32)

## 2023-07-25 LAB — GLUCOSE, CAPILLARY: Glucose-Capillary: 133 mg/dL — ABNORMAL HIGH (ref 70–99)

## 2023-07-25 SURGERY — INSERTION, GOLD SEEDS
Anesthesia: Monitor Anesthesia Care | Site: Prostate

## 2023-07-25 MED ORDER — OXYCODONE HCL 5 MG PO TABS: 5.0000 mg | ORAL_TABLET | Freq: Once | ORAL | Status: DC | PRN

## 2023-07-25 MED ORDER — MIDAZOLAM HCL 2 MG/2ML IJ SOLN
INTRAMUSCULAR | Status: DC | PRN
  Administered 2023-07-25: 1 mg via INTRAVENOUS

## 2023-07-25 MED ORDER — PROPOFOL 500 MG/50ML IV EMUL
INTRAVENOUS | Status: DC | PRN
  Administered 2023-07-25: 200 ug/kg/min via INTRAVENOUS

## 2023-07-25 MED ORDER — CEFAZOLIN SODIUM-DEXTROSE 2-4 GM/100ML-% IV SOLN
INTRAVENOUS | Status: AC
  Filled 2023-07-25: qty 100

## 2023-07-25 MED ORDER — PROPOFOL 10 MG/ML IV BOLUS
INTRAVENOUS | Status: DC | PRN
  Administered 2023-07-25: 30 mg via INTRAVENOUS

## 2023-07-25 MED ORDER — CEFAZOLIN SODIUM-DEXTROSE 2-4 GM/100ML-% IV SOLN
2.0000 g | INTRAVENOUS | Status: AC
  Administered 2023-07-25: 2 g via INTRAVENOUS

## 2023-07-25 MED ORDER — DROPERIDOL 2.5 MG/ML IJ SOLN: 0.6250 mg | Freq: Once | INTRAMUSCULAR | Status: DC | PRN

## 2023-07-25 MED ORDER — FENTANYL CITRATE (PF) 100 MCG/2ML IJ SOLN: 25.0000 ug | INTRAMUSCULAR | Status: DC | PRN

## 2023-07-25 MED ORDER — BUPIVACAINE HCL (PF) 0.25 % IJ SOLN
INTRAMUSCULAR | Status: DC | PRN
  Administered 2023-07-25: 10 mL

## 2023-07-25 MED ORDER — PROPOFOL 500 MG/50ML IV EMUL
INTRAVENOUS | Status: AC
  Filled 2023-07-25: qty 100

## 2023-07-25 MED ORDER — LACTATED RINGERS IV SOLN: INTRAVENOUS | Status: DC

## 2023-07-25 MED ORDER — SODIUM CHLORIDE (PF) 0.9 % IJ SOLN
INTRAMUSCULAR | Status: DC | PRN
  Administered 2023-07-25: 10 mL

## 2023-07-25 MED ORDER — FENTANYL CITRATE (PF) 100 MCG/2ML IJ SOLN
INTRAMUSCULAR | Status: AC
  Filled 2023-07-25: qty 2

## 2023-07-25 MED ORDER — OXYCODONE HCL 5 MG/5ML PO SOLN: 5.0000 mg | Freq: Once | ORAL | Status: DC | PRN

## 2023-07-25 MED ORDER — ACETAMINOPHEN 500 MG PO TABS: 1000.0000 mg | ORAL_TABLET | Freq: Once | ORAL | Status: DC

## 2023-07-25 MED ORDER — MIDAZOLAM HCL 2 MG/2ML IJ SOLN
INTRAMUSCULAR | Status: AC
  Filled 2023-07-25: qty 2

## 2023-07-25 MED ORDER — FENTANYL CITRATE (PF) 100 MCG/2ML IJ SOLN
INTRAMUSCULAR | Status: DC | PRN
  Administered 2023-07-25: 50 ug via INTRAVENOUS

## 2023-07-25 SURGICAL SUPPLY — 27 items
BLADE CLIPPER SENSICLIP SURGIC (BLADE) ×1 IMPLANT
CNTNR URN SCR LID CUP LEK RST (MISCELLANEOUS) ×1 IMPLANT
CONT SPEC 4OZ STRL OR WHT (MISCELLANEOUS) ×1
COVER BACK TABLE 60X90IN (DRAPES) ×1 IMPLANT
DRSG TEGADERM 4X4.75 (GAUZE/BANDAGES/DRESSINGS) ×1 IMPLANT
DRSG TEGADERM 8X12 (GAUZE/BANDAGES/DRESSINGS) ×1 IMPLANT
GAUZE SPONGE 4X4 12PLY STRL (GAUZE/BANDAGES/DRESSINGS) ×1 IMPLANT
GLOVE BIO SURGEON STRL SZ7.5 (GLOVE) ×1 IMPLANT
GLOVE BIOGEL PI IND STRL 6.5 (GLOVE) IMPLANT
GLOVE BIOGEL PI IND STRL 7.0 (GLOVE) IMPLANT
GLOVE SURG ORTHO 8.5 STRL (GLOVE) ×1 IMPLANT
IMPL SPACEOAR VUE SYSTEM (Spacer) ×1 IMPLANT
IMPLANT SPACEOAR VUE SYSTEM (Spacer) ×1 IMPLANT
KIT TURNOVER CYSTO (KITS) ×1 IMPLANT
MARKER GOLD PRELOAD 1.2X3 (Urological Implant) ×1 IMPLANT
MARKER SKIN DUAL TIP RULER LAB (MISCELLANEOUS) ×1 IMPLANT
NDL SPNL 22GX3.5 QUINCKE BK (NEEDLE) ×1 IMPLANT
NEEDLE SPNL 22GX3.5 QUINCKE BK (NEEDLE) ×1
SEED GOLD PRELOAD 1.2X3 (Urological Implant) ×1 IMPLANT
SHEATH ULTRASOUND LF (SHEATH) IMPLANT
SHEATH ULTRASOUND LTX NONSTRL (SHEATH) IMPLANT
SLEEVE SCD COMPRESS KNEE MED (STOCKING) ×1 IMPLANT
SURGILUBE 2OZ TUBE FLIPTOP (MISCELLANEOUS) ×1 IMPLANT
SYR 10ML LL (SYRINGE) ×1 IMPLANT
SYR CONTROL 10ML LL (SYRINGE) ×1 IMPLANT
TOWEL OR 17X24 6PK STRL BLUE (TOWEL DISPOSABLE) ×1 IMPLANT
UNDERPAD 30X36 HEAVY ABSORB (UNDERPADS AND DIAPERS) ×1 IMPLANT

## 2023-07-25 NOTE — Op Note (Signed)
Operative Note  Preoperative diagnosis:  1.  Clinically localized adenocarcinoma of the prostate   Postoperative diagnosis: 1.  Clinically localized adenocarcinoma of the prostate  Procedure(s): 1. Placement of fiducial markers into prostate 2. Insertion of SpaceOAR hydrogel   Surgeon: Jettie Pagan, MD  Assistants:  None  Anesthesia:  General  Complications:  None  EBL:  Minimal  Specimens: 1. None  Drains/Catheters: 1.  None  Indication:  Henry Walker is a 62 y.o. male with clinically localized prostate cancer. After discussing management options for treatment, he elected to proceed with radiotherapy. He presents today for the above procedures. The potential risks, complications, alternative options, and expected recovery course have been discussed in detail with the patient and he has provided informed consent to proceed.  Description of procedure: The patient was administered preoperative antibiotics, placed in the dorsal lithotomy position, and prepped and draped in the usual sterile fashion. Next, transrectal ultrasonography was utilized to visualize the prostate. Three gold fiducial markers were then placed into the prostate via transperineal needles under ultrasound guidance at the right apex, right base, and left mid gland under direct ultrasound guidance. A site in the midline was then selected on the perineum for placement of an 18 g needle with saline. The needle was advanced above the rectum and below Denonvillier's fascia to the mid gland and confirmed to be in the midline on transverse imaging. One cc of saline was injected confirming appropriate expansion of this space. A total of 5 cc of saline was then injected to open the space further bilaterally. The saline syringe was then removed and the SpaceOAR hydrogel was injected with good distribution bilaterally. He tolerated the procedure well and without complications. He was given a voiding trial prior to  discharge from the PACU.  Matt R. Ivin Rosenbloom MD [facility]  Pager: [phone]

## 2023-07-25 NOTE — H&P (Signed)
Visit Report     07/02/2023   --------------------------------------------------------------------------------   Henry Walker  MRN: [id number]  DOB: 16-Apr-1961, 62 year old Male  SSN:    PRIMARY CARE:  Hoy Register, MD  PRIMARY CARE FAX:  [phone]  REFERRING:  MaryEllen D. Arita Miss, MD  PROVIDER:  Kasandra Knudsen, M.D.  LOCATION:  [facility] [phone]     --------------------------------------------------------------------------------   CC/HPI: cc: prostate cancer   04/23/23: 62 yo man referred for PSA 6.8. Patient also has mild lower urinary tract symptoms including feeling of incomplete emptying as well as erectile dysfunction. He does not have a family history of prostate cancer and has not had any recent UTIs or hematuria.   IPSS: 5, 1, 0, 0, not answered, 0, 1=7/35  Unhappy QOL 5/6  SHIM: 1,1,1,1,1=5/25   06/20/2023: Here for TRUS prostate biopsy. He confirmed taking antibiotic prior to apt.   07/02/23: 63 year old male with a elevated PSA of 6.88 here for prostate biopsy results. I had previously called him on the phone and let him know that there was prostate cancer in 1/12 core. He is here today with his mom. Of note he did not tolerate the prostate biopsy very well at all.   Gleason 3+4=7 in 1/core cores  PSA 6.88   MSK Pre-radical prostatectomy  15 yr OS - 98%  PFS 34yr-85%, 10 yr-75%  OC  71  ECE 27%  LN 3%  SVI 2%     ALLERGIES: No Known Drug Allergies    MEDICATIONS: Allopurinol  Metformin Hcl  Albuterol Sulfate  Amlodipine Besilate  Atorvastatin Calcium  Carvedilol  Pantoprazole Sodium  Trazodone Hcl  Valsartan     GU PSH: Prostate Needle Biopsy - 06/20/2023     NON-GU PSH: Back surgery Surgical Pathology, Gross And Microscopic Examination For Prostate Needle - 06/20/2023     GU PMH: BPH w/LUTS - 06/20/2023, - 04/23/2023 Elevated PSA - 06/20/2023, - 04/23/2023 Incomplete bladder emptying - 06/20/2023, - 04/23/2023 ED due  to arterial insufficiency - 04/23/2023    NON-GU PMH: No Non-GU PMH    FAMILY HISTORY: 1 Daughter - Other 1 son - Other   SOCIAL HISTORY: Marital Status: Single Ethnicity: Not Hispanic Or Latino; Race: Black or African American Current Smoking Status: Patient does not smoke anymore.   Tobacco Use Assessment Completed: Used Tobacco in last 30 days? Drinks 2 caffeinated drinks per day.    REVIEW OF SYSTEMS:    GU Review Male:   Patient denies frequent urination, hard to postpone urination, burning/ pain with urination, get up at night to urinate, leakage of urine, stream starts and stops, trouble starting your stream, have to strain to urinate , erection problems, and penile pain.  Gastrointestinal (Upper):   Patient denies nausea, vomiting, and indigestion/ heartburn.  Gastrointestinal (Lower):   Patient denies diarrhea and constipation.  Constitutional:   Patient denies fever, night sweats, weight loss, and fatigue.  Skin:   Patient denies skin rash/ lesion and itching.  Eyes:   Patient denies blurred vision and double vision.  Ears/ Nose/ Throat:   Patient denies sinus problems and sore throat.  Hematologic/Lymphatic:   Patient denies swollen glands and easy bruising.  Cardiovascular:   Patient denies leg swelling and chest pains.  Respiratory:   Patient denies cough and shortness of breath.  Endocrine:   Patient denies excessive thirst.  Musculoskeletal:   Patient denies back pain and joint pain.  Neurological:   Patient denies headaches and dizziness.  Psychologic:   Patient denies depression and anxiety.   VITAL SIGNS: None   MULTI-SYSTEM PHYSICAL EXAMINATION:    Constitutional: Well-nourished. No physical deformities. Normally developed. Good grooming.  Neck: Neck symmetrical, not swollen. Normal tracheal position.  Respiratory: No labored breathing, no use of accessory muscles.   Skin: No paleness, no jaundice, no cyanosis. No lesion, no ulcer, no rash.  Neurologic /  Psychiatric: Oriented to time, oriented to place, oriented to person. No depression, no anxiety, no agitation.  Eyes: Normal conjunctivae. Normal eyelids.  Ears, Nose, Mouth, and Throat: Left ear no scars, no lesions, no masses. Right ear no scars, no lesions, no masses. Nose no scars, no lesions, no masses. Normal hearing. Normal lips.  Musculoskeletal: Normal gait and station of head and neck.     Complexity of Data:  Lab Test Review:   Path Report  Records Review:   Previous Patient Records, POC Tool  Urine Test Review:   Urinalysis   04/23/23  PSA  Total PSA 6.88 ng/mL  Free PSA 1.67 ng/mL  % Free PSA 24 % PSA    PROCEDURES:          Urinalysis w/Scope Dipstick Dipstick Cont'd Micro  Color: Yellow Bilirubin: Neg mg/dL WBC/hpf: 0 - 5/hpf  Appearance: Clear Ketones: Neg mg/dL RBC/hpf: 3 - 78/GNF  Specific Gravity: 1.020 Blood: 2+ ery/uL Bacteria: Rare (0-9/hpf)  pH: 6.0 Protein: Neg mg/dL Cystals: NS (Not Seen)  Glucose: Neg mg/dL Urobilinogen: 0.2 mg/dL Casts: NS (Not Seen)    Nitrites: Neg Trichomonas: Not Present    Leukocyte Esterase: Neg leu/uL Mucous: Not Present      Epithelial Cells: NS (Not Seen)      Yeast: NS (Not Seen)      Sperm: Not Present    ASSESSMENT:      ICD-10 Details  1 GU:   BPH w/LUTS - N40.1 Chronic, Stable  2   ED due to arterial insufficiency - N52.01 Chronic, Stable  3   Incomplete bladder emptying - R39.14 Chronic, Stable  4   Prostate Cancer - C61 Undiagnosed New Problem   PLAN:           Document Letter(s):  Created for Patient: Clinical Summary         Notes:   Prostate cancer:  Gleason 3+4= 7, grade group 2, favorable intermediate risk  -I gave patient printed copy of pathology report, patient information from [facility] on early stage prostate cancer, and a copy of MSK preradical prostatectomy nomogram  -We discussed management options for patient including active surveillance, surgery and radiation  -While patient  only has 1 core positive of Gleason 7 and we could discuss active surveillance he did not tolerate a prostate biopsy well and it would be difficult to perform a confirmatory biopsy or an MR fusion biopsy.  -He is interested in proceeding with radiation and we will refer him over to Dr. Kathrynn Running to discuss brachytherapy versus EBRT.   CC: Dr. Hoy Register   Urology Preoperative H&P   Chief Complaint: Prostate cancer  History of Present Illness: Henry Walker is a 62 y.o. male with localized prostate cancer here for fiducial markers and [employer]. Denies fevers, chills, dysuria.   Past Medical History:  Diagnosis Date   Benign localized prostatic hyperplasia with lower urinary tract symptoms (LUTS)    Chronic gout    ED (erectile dysfunction)    Emphysema of lung (HCC)    Full dentures    GERD (gastroesophageal reflux  disease)    Hx of adenomatous colonic polyps 10/06/2014   Hyperlipidemia    Hypertension    Malignant neoplasm prostate [facility]) 05/2023   urologist--- dr pace/  radiation onologist--- dr Kathrynn Running;  dx 08/ 2024, gleason 3 +4   Type 2 diabetes mellitus (HCC)    followed by pcp;    (07-17-2023  per pt checks blood sugar every other day)    Past Surgical History:  Procedure Laterality Date   COLONOSCOPY WITH PROPOFOL  05/19/2019   dr Leone Payor   POSTERIOR FUSION LUMBAR SPINE  09/03/2006   @[facility]  by dr Judie Petit. Noel Gerold;  laminectomy/  disectomy/ fusion L5--S1    Allergies: No Known Allergies  Family History  Problem Relation Age of Onset   Hypertension Mother    Cirrhosis Father    Hypertension Sister    Stroke Brother    Colon cancer Neg Hx    Esophageal cancer Neg Hx    Rectal cancer Neg Hx    Stomach cancer Neg Hx    Colon polyps Neg Hx     Social History:  reports that he has been smoking cigarettes. He has never used smokeless tobacco. He reports that he does not drink alcohol and does not use drugs.  ROS: A complete review of systems was performed.  All  systems are negative except for pertinent findings as noted.  Physical Exam:  Vital signs in last 24 hours: Temp:  [97.8 F (36.6 C)] 97.8 F (36.6 C) (09/27 0702) Pulse Rate:  [67] 67 (09/27 0702) Resp:  [17] 17 (09/27 0702) BP: (121)/(76) 121/76 (09/27 0702) SpO2:  [99 %] 99 % (09/27 0702) Weight:  [78.3 kg] 78.3 kg (09/27 0702) Constitutional:  Alert and oriented, No acute distress Cardiovascular: Regular rate and rhythm Respiratory: Normal respiratory effort, Lungs clear bilaterally GI: Abdomen is soft, nontender, nondistended, no abdominal masses GU: No CVA tenderness Lymphatic: No lymphadenopathy Neurologic: Grossly intact, no focal deficits Psychiatric: Normal mood and affect  Laboratory Data:  Recent Labs    07/25/23 0722  HGB 14.3  HCT 42.0    Recent Labs    07/25/23 0722  NA 141  K 3.7  CL 101  GLUCOSE 133*  BUN 15  CREATININE 1.30*     Results for orders placed or performed during the hospital encounter of 07/25/23 (from the past 24 hour(s))  I-STAT, chem 8     Status: Abnormal   Collection Time: 07/25/23  7:22 AM  Result Value Ref Range   Sodium 141 135 - 145 mmol/L   Potassium 3.7 3.5 - 5.1 mmol/L   Chloride 101 98 - 111 mmol/L   BUN 15 8 - 23 mg/dL   Creatinine, Ser 9.62 (H) 0.61 - 1.24 mg/dL   Glucose, Bld 952 (H) 70 - 99 mg/dL   Calcium, Ion 8.41 3.24 - 1.40 mmol/L   TCO2 27 22 - 32 mmol/L   Hemoglobin 14.3 13.0 - 17.0 g/dL   HCT 40.1 02.7 - 25.3 %   No results found for this or any previous visit (from the past 240 hour(s)).  Renal Function: Recent Labs    07/25/23 6644  CREATININE 1.30*   Estimated Creatinine Clearance: 54.4 mL/min (A) (by C-G formula based on SCr of 1.3 mg/dL (H)).  Radiologic Imaging: No results found.  I independently reviewed the above imaging studies.  Assessment and Plan Henry Walker is a 62 y.o. male with localized prostate cancer here for fiducial markers and [employer].   Henry R. Cardell Peach  MD 07/25/2023, 8:39 AM  [facility] Pager: [phone]): [phone]

## 2023-07-25 NOTE — Anesthesia Procedure Notes (Signed)
Procedure Name: MAC Date/Time: 07/25/2023 9:07 AM  Performed by: Francie Massing, CRNAPre-anesthesia Checklist: Patient identified, Emergency Drugs available, Suction available and Patient being monitored Oxygen Delivery Method: Simple face mask

## 2023-07-25 NOTE — Discharge Instructions (Addendum)
Activity:  You are encouraged to ambulate frequently (about every hour during waking hours) to help prevent blood clots from forming in your legs or lungs.  However, you should not engage in any heavy lifting (> 10-15 lbs), strenuous activity, or straining.  Diet: You should advance your diet as instructed by your physician.  It will be normal to have some bloating, nausea, and abdominal discomfort intermittently.  What to call us about: You should call the office [phone]) if you develop fever > 101 or develop persistent vomiting. Activity:  You are encouraged to ambulate frequently (about every hour during waking hours) to help prevent blood clots from forming in your legs or lungs.  However, you should not engage in any heavy lifting (> 10-15 lbs), strenuous activity, or straining.        Post Anesthesia Home Care Instructions  Activity: Get plenty of rest for the remainder of the day. A responsible individual must stay with you for 24 hours following the procedure.  For the next 24 hours, DO NOT: -Drive a car -Advertising copywriter -Drink alcoholic beverages -Take any medication unless instructed by your physician -Make any legal decisions or sign important papers.  Meals: Start with liquid foods such as gelatin or soup. Progress to regular foods as tolerated. Avoid greasy, spicy, heavy foods. If nausea and/or vomiting occur, drink only clear liquids until the nausea and/or vomiting subsides. Call your physician if vomiting continues.  Special Instructions/Symptoms: Your throat may feel dry or sore from the anesthesia or the breathing tube placed in your throat during surgery. If this causes discomfort, gargle with warm salt water. The discomfort should disappear within 24 hours.

## 2023-07-25 NOTE — Transfer of Care (Signed)
Immediate Anesthesia Transfer of Care Note  Patient: Henry Walker  Procedure(s) Performed: Procedure(s) (LRB): GOLD SEED IMPLANT (N/A) SPACE OAR INSTILLATION (N/A)  Patient Location: PACU  Anesthesia Type: General  Level of Consciousness: awake, oriented, sedated and patient cooperative  Airway & Oxygen Therapy: Patient Spontanous Breathing and Patient connected to face mask oxygen  Post-op Assessment: Report given to PACU RN and Post -op Vital signs reviewed and stable  Post vital signs: Reviewed and stable  Complications: No apparent anesthesia complications Last Vitals:  Vitals Value Taken Time  BP    Temp    Pulse 85 07/25/23 0930  Resp 10 07/25/23 0932  SpO2 100 % 07/25/23 0930  Vitals shown include unfiled device data.  Last Pain:  Vitals:   07/25/23 0702  TempSrc: Oral  PainSc: 0-No pain      Patients Stated Pain Goal: 0 (07/25/23 1610)  Complications: No notable events documented.

## 2023-07-25 NOTE — Anesthesia Preprocedure Evaluation (Addendum)
Anesthesia Evaluation  Patient identified by MRN, date of birth, ID band Patient awake    Reviewed: Allergy & Precautions, NPO status , Patient's Chart, lab work & pertinent test results, reviewed documented beta blocker date and time   Airway Mallampati: III  TM Distance: >3 FB Neck ROM: Full    Dental  (+) Edentulous Upper, Edentulous Lower, Dental Advisory Given, Upper Dentures, Lower Dentures   Pulmonary COPD, Current Smoker   breath sounds clear to auscultation + decreased breath sounds      Cardiovascular hypertension, Pt. on home beta blockers and Pt. on medications  Rhythm:Regular Rate:Normal     Neuro/Psych negative neurological ROS     GI/Hepatic Neg liver ROS,GERD  ,,  Endo/Other  diabetes    Renal/GU negative Renal ROS     Musculoskeletal  (+) Arthritis ,    Abdominal   Peds  Hematology negative hematology ROS (+)   Anesthesia Other Findings   Reproductive/Obstetrics                             Anesthesia Physical Anesthesia Plan  ASA: 3  Anesthesia Plan: MAC   Post-op Pain Management: Tylenol PO (pre-op)*   Induction: Intravenous  PONV Risk Score and Plan: 1 and Propofol infusion, TIVA and Treatment may vary due to age or medical condition  Airway Management Planned: Natural Airway  Additional Equipment:   Intra-op Plan:   Post-operative Plan:   Informed Consent: I have reviewed the patients History and Physical, chart, labs and discussed the procedure including the risks, benefits and alternatives for the proposed anesthesia with the patient or authorized representative who has indicated his/her understanding and acceptance.     Dental advisory given  Plan Discussed with: CRNA  Anesthesia Plan Comments:        Anesthesia Quick Evaluation

## 2023-07-28 ENCOUNTER — Encounter (HOSPITAL_BASED_OUTPATIENT_CLINIC_OR_DEPARTMENT_OTHER): Payer: Self-pay | Admitting: Urology

## 2023-07-28 NOTE — Anesthesia Postprocedure Evaluation (Signed)
Anesthesia Post Note  Patient: Henry Walker  Procedure(s) Performed: GOLD SEED IMPLANT (Prostate) SPACE OAR INSTILLATION (Prostate)     Patient location during evaluation: PACU Anesthesia Type: MAC Level of consciousness: awake and alert Pain management: pain level controlled Vital Signs Assessment: post-procedure vital signs reviewed and stable Respiratory status: spontaneous breathing Cardiovascular status: stable Anesthetic complications: no   No notable events documented.  Last Vitals:  Vitals:   07/25/23 1002 07/25/23 1020  BP:  123/72  Pulse:  (!) 56  Resp:  14  Temp: 36.4 C 36.6 C  SpO2:  95%    Last Pain:  Vitals:   07/25/23 1020  TempSrc:   PainSc: 0-No pain                 Lewie Loron

## 2023-07-30 ENCOUNTER — Telehealth: Payer: Self-pay | Admitting: *Deleted

## 2023-07-30 NOTE — Telephone Encounter (Signed)
Called patient to remind of sim appt.for 08-01-23, - arrival time- 2:45 pm @ [facility], informed patient to arrive with a full bladder, spoke with patient and he is aware of this appt. and the instructions

## 2023-08-01 ENCOUNTER — Ambulatory Visit
Admission: RE | Admit: 2023-08-01 | Discharge: 2023-08-01 | Disposition: A | Discharge status: Home / Self Care | Payer: 59 | Source: Ambulatory Visit | Attending: Radiation Oncology | Admitting: Radiation Oncology

## 2023-08-01 DIAGNOSIS — R221 Localized swelling, mass and lump, neck: Secondary | ICD-10-CM | POA: Diagnosis not present

## 2023-08-01 DIAGNOSIS — C61 Malignant neoplasm of prostate: Secondary | ICD-10-CM | POA: Diagnosis present

## 2023-08-01 DIAGNOSIS — Z510 Encounter for antineoplastic radiation therapy: Secondary | ICD-10-CM | POA: Insufficient documentation

## 2023-08-01 DIAGNOSIS — Z191 Hormone sensitive malignancy status: Secondary | ICD-10-CM | POA: Diagnosis not present

## 2023-08-01 NOTE — Progress Notes (Signed)
Radiation Oncology         [phone] ________________________________  Name: Jozeph Romer MRN: [id number]  Date: 08/01/2023  DOB: 01-23-1961  SIMULATION AND TREATMENT PLANNING NOTE    ICD-10-CM   1. Malignant neoplasm of prostate (HCC)  C61       DIAGNOSIS:   62 y.o. gentleman with Stage T1c adenocarcinoma of the prostate with Gleason score of 3+4, and PSA of 6.9.  NARRATIVE:  The patient was brought to the CT Simulation planning suite.  Identity was confirmed.  All relevant records and images related to the planned course of therapy were reviewed.  The patient freely provided informed written consent to proceed with treatment after reviewing the details related to the planned course of therapy. The consent form was witnessed and verified by the simulation staff.  Then, the patient was set-up in a stable reproducible supine position for radiation therapy.  A vacuum lock pillow device was custom fabricated to position his legs in a reproducible immobilized position.  Then, supervised the performance of a urethrogram under sterile conditions to identify the prostatic apex.  CT images were obtained.  Surface markings were placed.  The CT images were loaded into the planning software.  Then the prostate target and avoidance structures including the rectum, bladder, bowel and hips were contoured.  Treatment planning then occurred.  The radiation prescription was entered and confirmed.  A total of one complex treatment devices was fabricated. I have requested : Intensity Modulated Radiotherapy (IMRT) is medically necessary for this case for the following reason:  Rectal sparing.  I have requested daily cone beam CT volumetric image gudiance to track gold fiducial posiitoning along with bladder and rectal filling, this is medically necessary to assure accurate positioning of high dose radiation.  PLAN:  The patient will receive 70 Gy in 28  fractions.  ________________________________  Artist Pais Kathrynn Running, M.D.

## 2023-08-04 DIAGNOSIS — Z510 Encounter for antineoplastic radiation therapy: Secondary | ICD-10-CM | POA: Diagnosis not present

## 2023-08-04 DIAGNOSIS — Z191 Hormone sensitive malignancy status: Secondary | ICD-10-CM | POA: Diagnosis not present

## 2023-08-07 ENCOUNTER — Inpatient Hospital Stay: Payer: 59 | Attending: Radiation Oncology | Admitting: Licensed Clinical Social Worker

## 2023-08-07 DIAGNOSIS — C61 Malignant neoplasm of prostate: Secondary | ICD-10-CM

## 2023-08-07 NOTE — Progress Notes (Signed)
Referral placed for social work to evaluate for any financial resources that can be offered to patient due to [employer] not be available at this time.

## 2023-08-07 NOTE — Progress Notes (Signed)
[facility] Clinical Social Work  Initial Assessment   Henry Walker is a 62 y.o. year old male contacted by phone. Clinical Social Work was referred by medical provider for assessment of psychosocial needs.   SDOH (Social Determinants of Health) assessments performed: Yes SDOH Interventions    Flowsheet Row Clinical Support from 04/16/2023 in [facility]  SDOH Interventions   Food Insecurity Interventions Intervention Not Indicated  Housing Interventions Intervention Not Indicated  Transportation Interventions Intervention Not Indicated  Utilities Interventions Intervention Not Indicated  Alcohol Usage Interventions Intervention Not Indicated (Score <7)  Financial Strain Interventions Intervention Not Indicated  Physical Activity Interventions Intervention Not Indicated  Stress Interventions Intervention Not Indicated  Social Connections Interventions Intervention Not Indicated       SDOH Screenings   Food Insecurity: Food Insecurity Present (07/15/2023)  Housing: Low Risk  (07/15/2023)  Transportation Needs: No Transportation Needs (07/15/2023)  Utilities: Not At Risk (07/15/2023)  Alcohol Screen: Low Risk  (07/15/2023)  Depression (PHQ2-9): Low Risk  (07/15/2023)  Financial Resource Strain: Low Risk  (04/16/2023)  Physical Activity: Sufficiently Active (04/16/2023)  Social Connections: Socially Isolated (04/16/2023)  Stress: No Stress Concern Present (04/16/2023)  Tobacco Use: High Risk (07/25/2023)     Distress Screen completed: No     No data to display            Family/Social Information:  Housing Arrangement: patient lives alone Family members/support persons in your life? Pt reports his 25 y/o mother is the only family close by and he is the one who provides assistance to her.  Transportation concerns: yes, due to the price of gas  Employment: Retired pt sustained a back injury at work 14 years ago and has been receiving  disability since that time.  Income source: Secretary/administrator concerns: Yes, current concerns Type of concern: Lobbyist access concerns: no Religious or spiritual practice: Yes-Christian Services Currently in place:  pt receives $25/month in [employer] benefits  Coping/ Adjustment to diagnosis: Patient understands treatment plan and what happens next? yes Concerns about diagnosis and/or treatment: How I will pay for the services I need Patient reported stressors: Finances Hopes and/or priorities: pt's priority is to start radiation w/ the hope of positive results Patient enjoys  not addressed Current coping skills/ strengths: Capable of independent living , Motivation for treatment/growth , and Physical Health     SUMMARY: Current SDOH Barriers:  Financial constraints related to fixed income  Clinical Social Work Clinical Goal(s):  Explore community resource options for unmet needs related to:  Insurance risk surveyor Strain   Interventions: Discussed common feeling and emotions when being diagnosed with cancer, and the importance of support during treatment Informed patient of the support team roles and support services at [facility] Provided CSW contact information and encouraged patient to call with any questions or concerns Referred patient to transportation to assist w/ setting up Medicaid transport.  Informed pt of food pantry to assist w/ food while undergoing treatment.   Follow Up Plan: Patient will contact CSW with any support or resource needs Patient verbalizes understanding of plan: Yes    Rachel Moulds, LCSW Clinical Social Worker [facility]

## 2023-08-18 ENCOUNTER — Other Ambulatory Visit: Payer: Self-pay

## 2023-08-18 ENCOUNTER — Ambulatory Visit
Admission: RE | Admit: 2023-08-18 | Discharge: 2023-08-18 | Disposition: A | Discharge status: Home / Self Care | Payer: 59 | Source: Ambulatory Visit | Attending: Radiation Oncology

## 2023-08-18 DIAGNOSIS — Z510 Encounter for antineoplastic radiation therapy: Secondary | ICD-10-CM | POA: Diagnosis not present

## 2023-08-18 DIAGNOSIS — Z191 Hormone sensitive malignancy status: Secondary | ICD-10-CM | POA: Diagnosis not present

## 2023-08-18 LAB — RAD ONC ARIA SESSION SUMMARY
Course Elapsed Days: 0
Plan Fractions Treated to Date: 1
Plan Prescribed Dose Per Fraction: 2.5 Gy
Plan Total Fractions Prescribed: 28
Plan Total Prescribed Dose: 70 Gy
Reference Point Dosage Given to Date: 2.5 Gy
Reference Point Session Dosage Given: 2.5 Gy
Session Number: 1

## 2023-08-19 ENCOUNTER — Other Ambulatory Visit: Payer: Self-pay

## 2023-08-19 ENCOUNTER — Ambulatory Visit
Admission: RE | Admit: 2023-08-19 | Discharge: 2023-08-19 | Disposition: A | Discharge status: Home / Self Care | Payer: 59 | Source: Ambulatory Visit | Attending: Radiation Oncology

## 2023-08-19 DIAGNOSIS — Z510 Encounter for antineoplastic radiation therapy: Secondary | ICD-10-CM | POA: Diagnosis not present

## 2023-08-19 DIAGNOSIS — Z191 Hormone sensitive malignancy status: Secondary | ICD-10-CM | POA: Diagnosis not present

## 2023-08-19 LAB — RAD ONC ARIA SESSION SUMMARY
Course Elapsed Days: 1
Plan Fractions Treated to Date: 2
Plan Prescribed Dose Per Fraction: 2.5 Gy
Plan Total Fractions Prescribed: 28
Plan Total Prescribed Dose: 70 Gy
Reference Point Dosage Given to Date: 5 Gy
Reference Point Session Dosage Given: 2.5 Gy
Session Number: 2

## 2023-08-20 ENCOUNTER — Ambulatory Visit
Admission: RE | Admit: 2023-08-20 | Discharge: 2023-08-20 | Disposition: A | Discharge status: Home / Self Care | Payer: 59 | Source: Ambulatory Visit | Attending: Radiation Oncology | Admitting: Radiation Oncology

## 2023-08-20 ENCOUNTER — Other Ambulatory Visit: Payer: Self-pay

## 2023-08-20 DIAGNOSIS — Z191 Hormone sensitive malignancy status: Secondary | ICD-10-CM | POA: Diagnosis not present

## 2023-08-20 DIAGNOSIS — Z510 Encounter for antineoplastic radiation therapy: Secondary | ICD-10-CM | POA: Diagnosis not present

## 2023-08-20 LAB — RAD ONC ARIA SESSION SUMMARY
Course Elapsed Days: 2
Plan Fractions Treated to Date: 3
Plan Prescribed Dose Per Fraction: 2.5 Gy
Plan Total Fractions Prescribed: 28
Plan Total Prescribed Dose: 70 Gy
Reference Point Dosage Given to Date: 7.5 Gy
Reference Point Session Dosage Given: 2.5 Gy
Session Number: 3

## 2023-08-21 ENCOUNTER — Ambulatory Visit: Payer: 59

## 2023-08-22 ENCOUNTER — Other Ambulatory Visit: Payer: Self-pay

## 2023-08-22 ENCOUNTER — Ambulatory Visit
Admission: RE | Admit: 2023-08-22 | Discharge: 2023-08-22 | Disposition: A | Discharge status: Home / Self Care | Payer: 59 | Source: Ambulatory Visit | Attending: Radiation Oncology

## 2023-08-22 ENCOUNTER — Ambulatory Visit
Admission: RE | Admit: 2023-08-22 | Discharge: 2023-08-22 | Disposition: A | Discharge status: Home / Self Care | Payer: 59 | Source: Ambulatory Visit | Attending: Radiation Oncology | Admitting: Radiation Oncology

## 2023-08-22 ENCOUNTER — Other Ambulatory Visit: Payer: Self-pay | Admitting: Radiation Oncology

## 2023-08-22 DIAGNOSIS — Z191 Hormone sensitive malignancy status: Secondary | ICD-10-CM | POA: Diagnosis not present

## 2023-08-22 DIAGNOSIS — Z510 Encounter for antineoplastic radiation therapy: Secondary | ICD-10-CM | POA: Diagnosis not present

## 2023-08-22 LAB — RAD ONC ARIA SESSION SUMMARY
Course Elapsed Days: 4
Plan Fractions Treated to Date: 4
Plan Prescribed Dose Per Fraction: 2.5 Gy
Plan Total Fractions Prescribed: 28
Plan Total Prescribed Dose: 70 Gy
Reference Point Dosage Given to Date: 10 Gy
Reference Point Session Dosage Given: 2.5 Gy
Session Number: 4

## 2023-08-22 MED ORDER — TAMSULOSIN HCL 0.4 MG PO CAPS: 0.4000 mg | ORAL_CAPSULE | Freq: Every day | ORAL | 5 refills | Status: DC

## 2023-08-25 ENCOUNTER — Ambulatory Visit
Admission: RE | Admit: 2023-08-25 | Discharge: 2023-08-25 | Disposition: A | Discharge status: Home / Self Care | Payer: 59 | Source: Ambulatory Visit | Attending: Radiation Oncology

## 2023-08-25 ENCOUNTER — Other Ambulatory Visit: Payer: Self-pay

## 2023-08-25 DIAGNOSIS — Z510 Encounter for antineoplastic radiation therapy: Secondary | ICD-10-CM | POA: Diagnosis not present

## 2023-08-25 DIAGNOSIS — Z191 Hormone sensitive malignancy status: Secondary | ICD-10-CM | POA: Diagnosis not present

## 2023-08-25 LAB — RAD ONC ARIA SESSION SUMMARY
Course Elapsed Days: 7
Plan Fractions Treated to Date: 5
Plan Prescribed Dose Per Fraction: 2.5 Gy
Plan Total Fractions Prescribed: 28
Plan Total Prescribed Dose: 70 Gy
Reference Point Dosage Given to Date: 12.5 Gy
Reference Point Session Dosage Given: 2.5 Gy
Session Number: 5

## 2023-08-26 ENCOUNTER — Ambulatory Visit
Admission: RE | Admit: 2023-08-26 | Discharge: 2023-08-26 | Disposition: A | Discharge status: Home / Self Care | Payer: 59 | Source: Ambulatory Visit | Attending: Radiation Oncology

## 2023-08-26 ENCOUNTER — Other Ambulatory Visit: Payer: Self-pay

## 2023-08-26 DIAGNOSIS — Z191 Hormone sensitive malignancy status: Secondary | ICD-10-CM | POA: Diagnosis not present

## 2023-08-26 DIAGNOSIS — Z510 Encounter for antineoplastic radiation therapy: Secondary | ICD-10-CM | POA: Diagnosis not present

## 2023-08-26 LAB — RAD ONC ARIA SESSION SUMMARY
Course Elapsed Days: 8
Plan Fractions Treated to Date: 6
Plan Prescribed Dose Per Fraction: 2.5 Gy
Plan Total Fractions Prescribed: 28
Plan Total Prescribed Dose: 70 Gy
Reference Point Dosage Given to Date: 15 Gy
Reference Point Session Dosage Given: 2.5 Gy
Session Number: 6

## 2023-08-27 ENCOUNTER — Other Ambulatory Visit: Payer: Self-pay

## 2023-08-27 ENCOUNTER — Ambulatory Visit
Admission: RE | Admit: 2023-08-27 | Discharge: 2023-08-27 | Disposition: A | Discharge status: Home / Self Care | Payer: 59 | Source: Ambulatory Visit | Attending: Radiation Oncology | Admitting: Radiation Oncology

## 2023-08-27 DIAGNOSIS — Z510 Encounter for antineoplastic radiation therapy: Secondary | ICD-10-CM | POA: Diagnosis not present

## 2023-08-27 DIAGNOSIS — Z191 Hormone sensitive malignancy status: Secondary | ICD-10-CM | POA: Diagnosis not present

## 2023-08-27 LAB — RAD ONC ARIA SESSION SUMMARY
Course Elapsed Days: 9
Plan Fractions Treated to Date: 7
Plan Prescribed Dose Per Fraction: 2.5 Gy
Plan Total Fractions Prescribed: 28
Plan Total Prescribed Dose: 70 Gy
Reference Point Dosage Given to Date: 17.5 Gy
Reference Point Session Dosage Given: 2.5 Gy
Session Number: 7

## 2023-08-28 ENCOUNTER — Other Ambulatory Visit: Payer: Self-pay

## 2023-08-28 ENCOUNTER — Ambulatory Visit
Admission: RE | Admit: 2023-08-28 | Discharge: 2023-08-28 | Disposition: A | Discharge status: Home / Self Care | Payer: 59 | Source: Ambulatory Visit | Attending: Radiation Oncology | Admitting: Radiation Oncology

## 2023-08-28 DIAGNOSIS — Z510 Encounter for antineoplastic radiation therapy: Secondary | ICD-10-CM | POA: Diagnosis not present

## 2023-08-28 DIAGNOSIS — Z191 Hormone sensitive malignancy status: Secondary | ICD-10-CM | POA: Diagnosis not present

## 2023-08-28 LAB — RAD ONC ARIA SESSION SUMMARY
Course Elapsed Days: 10
Plan Fractions Treated to Date: 8
Plan Prescribed Dose Per Fraction: 2.5 Gy
Plan Total Fractions Prescribed: 28
Plan Total Prescribed Dose: 70 Gy
Reference Point Dosage Given to Date: 20 Gy
Reference Point Session Dosage Given: 2.5 Gy
Session Number: 8

## 2023-08-29 ENCOUNTER — Other Ambulatory Visit: Payer: Self-pay

## 2023-08-29 ENCOUNTER — Ambulatory Visit
Admission: RE | Admit: 2023-08-29 | Discharge: 2023-08-29 | Disposition: A | Discharge status: Home / Self Care | Payer: 59 | Source: Ambulatory Visit | Attending: Radiation Oncology | Admitting: Radiation Oncology

## 2023-08-29 DIAGNOSIS — R310 Gross hematuria: Secondary | ICD-10-CM | POA: Insufficient documentation

## 2023-08-29 DIAGNOSIS — C61 Malignant neoplasm of prostate: Secondary | ICD-10-CM | POA: Diagnosis present

## 2023-08-29 DIAGNOSIS — Z510 Encounter for antineoplastic radiation therapy: Secondary | ICD-10-CM | POA: Insufficient documentation

## 2023-08-29 DIAGNOSIS — Z191 Hormone sensitive malignancy status: Secondary | ICD-10-CM | POA: Diagnosis not present

## 2023-08-29 LAB — RAD ONC ARIA SESSION SUMMARY
Course Elapsed Days: 11
Plan Fractions Treated to Date: 9
Plan Prescribed Dose Per Fraction: 2.5 Gy
Plan Total Fractions Prescribed: 28
Plan Total Prescribed Dose: 70 Gy
Reference Point Dosage Given to Date: 22.5 Gy
Reference Point Session Dosage Given: 2.5 Gy
Session Number: 9

## 2023-09-01 ENCOUNTER — Other Ambulatory Visit: Payer: Self-pay

## 2023-09-01 ENCOUNTER — Ambulatory Visit
Admission: RE | Admit: 2023-09-01 | Discharge: 2023-09-01 | Disposition: A | Discharge status: Home / Self Care | Payer: 59 | Source: Ambulatory Visit | Attending: Radiation Oncology | Admitting: Radiation Oncology

## 2023-09-01 DIAGNOSIS — Z510 Encounter for antineoplastic radiation therapy: Secondary | ICD-10-CM | POA: Diagnosis not present

## 2023-09-01 DIAGNOSIS — R310 Gross hematuria: Secondary | ICD-10-CM | POA: Diagnosis not present

## 2023-09-01 DIAGNOSIS — Z191 Hormone sensitive malignancy status: Secondary | ICD-10-CM | POA: Diagnosis not present

## 2023-09-01 LAB — RAD ONC ARIA SESSION SUMMARY
Course Elapsed Days: 14
Plan Fractions Treated to Date: 10
Plan Prescribed Dose Per Fraction: 2.5 Gy
Plan Total Fractions Prescribed: 28
Plan Total Prescribed Dose: 70 Gy
Reference Point Dosage Given to Date: 25 Gy
Reference Point Session Dosage Given: 2.5 Gy
Session Number: 10

## 2023-09-02 ENCOUNTER — Ambulatory Visit
Admission: RE | Admit: 2023-09-02 | Discharge: 2023-09-02 | Disposition: A | Discharge status: Home / Self Care | Payer: 59 | Source: Ambulatory Visit | Attending: Radiation Oncology | Admitting: Radiation Oncology

## 2023-09-02 ENCOUNTER — Other Ambulatory Visit: Payer: Self-pay

## 2023-09-02 DIAGNOSIS — Z191 Hormone sensitive malignancy status: Secondary | ICD-10-CM | POA: Diagnosis not present

## 2023-09-02 DIAGNOSIS — R310 Gross hematuria: Secondary | ICD-10-CM | POA: Diagnosis not present

## 2023-09-02 DIAGNOSIS — Z510 Encounter for antineoplastic radiation therapy: Secondary | ICD-10-CM | POA: Diagnosis not present

## 2023-09-02 LAB — RAD ONC ARIA SESSION SUMMARY
Course Elapsed Days: 15
Plan Fractions Treated to Date: 11
Plan Prescribed Dose Per Fraction: 2.5 Gy
Plan Total Fractions Prescribed: 28
Plan Total Prescribed Dose: 70 Gy
Reference Point Dosage Given to Date: 27.5 Gy
Reference Point Session Dosage Given: 2.5 Gy
Session Number: 11

## 2023-09-03 ENCOUNTER — Ambulatory Visit
Admission: RE | Admit: 2023-09-03 | Discharge: 2023-09-03 | Disposition: A | Discharge status: Home / Self Care | Payer: 59 | Source: Ambulatory Visit | Attending: Radiation Oncology | Admitting: Radiation Oncology

## 2023-09-03 ENCOUNTER — Other Ambulatory Visit: Payer: Self-pay

## 2023-09-03 DIAGNOSIS — Z191 Hormone sensitive malignancy status: Secondary | ICD-10-CM | POA: Diagnosis not present

## 2023-09-03 DIAGNOSIS — R310 Gross hematuria: Secondary | ICD-10-CM | POA: Diagnosis not present

## 2023-09-03 DIAGNOSIS — Z510 Encounter for antineoplastic radiation therapy: Secondary | ICD-10-CM | POA: Diagnosis not present

## 2023-09-03 LAB — RAD ONC ARIA SESSION SUMMARY
Course Elapsed Days: 16
Plan Fractions Treated to Date: 12
Plan Prescribed Dose Per Fraction: 2.5 Gy
Plan Total Fractions Prescribed: 28
Plan Total Prescribed Dose: 70 Gy
Reference Point Dosage Given to Date: 30 Gy
Reference Point Session Dosage Given: 2.5 Gy
Session Number: 12

## 2023-09-04 ENCOUNTER — Ambulatory Visit: Payer: 59

## 2023-09-04 ENCOUNTER — Ambulatory Visit
Admission: RE | Admit: 2023-09-04 | Discharge: 2023-09-04 | Disposition: A | Discharge status: Home / Self Care | Payer: 59 | Source: Ambulatory Visit | Attending: Radiation Oncology | Admitting: Radiation Oncology

## 2023-09-04 ENCOUNTER — Other Ambulatory Visit: Payer: Self-pay

## 2023-09-04 DIAGNOSIS — Z191 Hormone sensitive malignancy status: Secondary | ICD-10-CM | POA: Diagnosis not present

## 2023-09-04 DIAGNOSIS — Z510 Encounter for antineoplastic radiation therapy: Secondary | ICD-10-CM | POA: Diagnosis not present

## 2023-09-04 DIAGNOSIS — R310 Gross hematuria: Secondary | ICD-10-CM | POA: Diagnosis not present

## 2023-09-04 LAB — RAD ONC ARIA SESSION SUMMARY
Course Elapsed Days: 17
Plan Fractions Treated to Date: 13
Plan Prescribed Dose Per Fraction: 2.5 Gy
Plan Total Fractions Prescribed: 28
Plan Total Prescribed Dose: 70 Gy
Reference Point Dosage Given to Date: 32.5 Gy
Reference Point Session Dosage Given: 2.5 Gy
Session Number: 13

## 2023-09-05 ENCOUNTER — Ambulatory Visit
Admission: RE | Admit: 2023-09-05 | Discharge: 2023-09-05 | Disposition: A | Discharge status: Home / Self Care | Payer: 59 | Source: Ambulatory Visit | Attending: Radiation Oncology

## 2023-09-05 ENCOUNTER — Other Ambulatory Visit: Payer: Self-pay

## 2023-09-05 ENCOUNTER — Ambulatory Visit: Payer: 59

## 2023-09-05 DIAGNOSIS — Z191 Hormone sensitive malignancy status: Secondary | ICD-10-CM | POA: Diagnosis not present

## 2023-09-05 DIAGNOSIS — R310 Gross hematuria: Secondary | ICD-10-CM | POA: Diagnosis not present

## 2023-09-05 DIAGNOSIS — Z510 Encounter for antineoplastic radiation therapy: Secondary | ICD-10-CM | POA: Diagnosis not present

## 2023-09-05 LAB — RAD ONC ARIA SESSION SUMMARY
Course Elapsed Days: 18
Plan Fractions Treated to Date: 14
Plan Prescribed Dose Per Fraction: 2.5 Gy
Plan Total Fractions Prescribed: 28
Plan Total Prescribed Dose: 70 Gy
Reference Point Dosage Given to Date: 35 Gy
Reference Point Session Dosage Given: 2.5 Gy
Session Number: 14

## 2023-09-08 ENCOUNTER — Ambulatory Visit
Admission: RE | Admit: 2023-09-08 | Discharge: 2023-09-08 | Disposition: A | Discharge status: Home / Self Care | Payer: 59 | Source: Ambulatory Visit | Attending: Radiation Oncology

## 2023-09-08 ENCOUNTER — Other Ambulatory Visit: Payer: Self-pay

## 2023-09-08 DIAGNOSIS — R310 Gross hematuria: Secondary | ICD-10-CM | POA: Diagnosis not present

## 2023-09-08 DIAGNOSIS — Z510 Encounter for antineoplastic radiation therapy: Secondary | ICD-10-CM | POA: Diagnosis not present

## 2023-09-08 DIAGNOSIS — Z191 Hormone sensitive malignancy status: Secondary | ICD-10-CM | POA: Diagnosis not present

## 2023-09-08 LAB — RAD ONC ARIA SESSION SUMMARY
Course Elapsed Days: 21
Plan Fractions Treated to Date: 15
Plan Prescribed Dose Per Fraction: 2.5 Gy
Plan Total Fractions Prescribed: 28
Plan Total Prescribed Dose: 70 Gy
Reference Point Dosage Given to Date: 37.5 Gy
Reference Point Session Dosage Given: 2.5 Gy
Session Number: 15

## 2023-09-09 ENCOUNTER — Other Ambulatory Visit: Payer: Self-pay

## 2023-09-09 ENCOUNTER — Ambulatory Visit
Admission: RE | Admit: 2023-09-09 | Discharge: 2023-09-09 | Disposition: A | Discharge status: Home / Self Care | Payer: 59 | Source: Ambulatory Visit | Attending: Radiation Oncology | Admitting: Radiation Oncology

## 2023-09-09 DIAGNOSIS — Z191 Hormone sensitive malignancy status: Secondary | ICD-10-CM | POA: Diagnosis not present

## 2023-09-09 DIAGNOSIS — R310 Gross hematuria: Secondary | ICD-10-CM | POA: Diagnosis not present

## 2023-09-09 DIAGNOSIS — Z510 Encounter for antineoplastic radiation therapy: Secondary | ICD-10-CM | POA: Diagnosis not present

## 2023-09-09 LAB — RAD ONC ARIA SESSION SUMMARY
Course Elapsed Days: 22
Plan Fractions Treated to Date: 16
Plan Prescribed Dose Per Fraction: 2.5 Gy
Plan Total Fractions Prescribed: 28
Plan Total Prescribed Dose: 70 Gy
Reference Point Dosage Given to Date: 40 Gy
Reference Point Session Dosage Given: 2.5 Gy
Session Number: 16

## 2023-09-10 ENCOUNTER — Other Ambulatory Visit: Payer: Self-pay

## 2023-09-10 ENCOUNTER — Ambulatory Visit
Admission: RE | Admit: 2023-09-10 | Discharge: 2023-09-10 | Discharge status: Home / Self Care | Payer: 59 | Source: Ambulatory Visit | Attending: Radiation Oncology

## 2023-09-10 DIAGNOSIS — Z510 Encounter for antineoplastic radiation therapy: Secondary | ICD-10-CM | POA: Diagnosis not present

## 2023-09-10 DIAGNOSIS — R310 Gross hematuria: Secondary | ICD-10-CM | POA: Diagnosis not present

## 2023-09-10 DIAGNOSIS — Z191 Hormone sensitive malignancy status: Secondary | ICD-10-CM | POA: Diagnosis not present

## 2023-09-10 LAB — RAD ONC ARIA SESSION SUMMARY
Course Elapsed Days: 23
Plan Fractions Treated to Date: 17
Plan Prescribed Dose Per Fraction: 2.5 Gy
Plan Total Fractions Prescribed: 28
Plan Total Prescribed Dose: 70 Gy
Reference Point Dosage Given to Date: 42.5 Gy
Reference Point Session Dosage Given: 2.5 Gy
Session Number: 17

## 2023-09-11 ENCOUNTER — Ambulatory Visit
Admission: RE | Admit: 2023-09-11 | Discharge: 2023-09-11 | Disposition: A | Discharge status: Home / Self Care | Payer: 59 | Source: Ambulatory Visit | Attending: Radiation Oncology | Admitting: Radiation Oncology

## 2023-09-11 ENCOUNTER — Other Ambulatory Visit: Payer: Self-pay

## 2023-09-11 DIAGNOSIS — R310 Gross hematuria: Secondary | ICD-10-CM | POA: Diagnosis not present

## 2023-09-11 DIAGNOSIS — Z191 Hormone sensitive malignancy status: Secondary | ICD-10-CM | POA: Diagnosis not present

## 2023-09-11 DIAGNOSIS — Z510 Encounter for antineoplastic radiation therapy: Secondary | ICD-10-CM | POA: Diagnosis not present

## 2023-09-11 LAB — RAD ONC ARIA SESSION SUMMARY
Course Elapsed Days: 24
Plan Fractions Treated to Date: 18
Plan Prescribed Dose Per Fraction: 2.5 Gy
Plan Total Fractions Prescribed: 28
Plan Total Prescribed Dose: 70 Gy
Reference Point Dosage Given to Date: 45 Gy
Reference Point Session Dosage Given: 2.5 Gy
Session Number: 18

## 2023-09-12 ENCOUNTER — Other Ambulatory Visit: Payer: Self-pay

## 2023-09-12 ENCOUNTER — Ambulatory Visit
Admission: RE | Admit: 2023-09-12 | Discharge: 2023-09-12 | Disposition: A | Discharge status: Home / Self Care | Payer: 59 | Source: Ambulatory Visit | Attending: Radiation Oncology

## 2023-09-12 DIAGNOSIS — R310 Gross hematuria: Secondary | ICD-10-CM | POA: Diagnosis not present

## 2023-09-12 DIAGNOSIS — Z191 Hormone sensitive malignancy status: Secondary | ICD-10-CM | POA: Diagnosis not present

## 2023-09-12 DIAGNOSIS — Z510 Encounter for antineoplastic radiation therapy: Secondary | ICD-10-CM | POA: Diagnosis not present

## 2023-09-12 LAB — RAD ONC ARIA SESSION SUMMARY
Course Elapsed Days: 25
Plan Fractions Treated to Date: 19
Plan Prescribed Dose Per Fraction: 2.5 Gy
Plan Total Fractions Prescribed: 28
Plan Total Prescribed Dose: 70 Gy
Reference Point Dosage Given to Date: 47.5 Gy
Reference Point Session Dosage Given: 2.5 Gy
Session Number: 19

## 2023-09-15 ENCOUNTER — Other Ambulatory Visit: Payer: Self-pay

## 2023-09-15 ENCOUNTER — Ambulatory Visit
Admission: RE | Admit: 2023-09-15 | Discharge: 2023-09-15 | Disposition: A | Discharge status: Home / Self Care | Payer: 59 | Source: Ambulatory Visit | Attending: Radiation Oncology

## 2023-09-15 ENCOUNTER — Encounter (HOSPITAL_COMMUNITY): Payer: Self-pay | Admitting: Emergency Medicine

## 2023-09-15 ENCOUNTER — Emergency Department (HOSPITAL_COMMUNITY)
Admission: EM | Admit: 2023-09-15 | Discharge: 2023-09-15 | Disposition: A | Discharge status: Home / Self Care | Payer: 59 | Attending: Emergency Medicine | Admitting: Emergency Medicine

## 2023-09-15 DIAGNOSIS — I10 Essential (primary) hypertension: Secondary | ICD-10-CM | POA: Insufficient documentation

## 2023-09-15 DIAGNOSIS — Z8546 Personal history of malignant neoplasm of prostate: Secondary | ICD-10-CM | POA: Insufficient documentation

## 2023-09-15 DIAGNOSIS — E876 Hypokalemia: Secondary | ICD-10-CM | POA: Insufficient documentation

## 2023-09-15 DIAGNOSIS — Z191 Hormone sensitive malignancy status: Secondary | ICD-10-CM | POA: Diagnosis not present

## 2023-09-15 DIAGNOSIS — R339 Retention of urine, unspecified: Secondary | ICD-10-CM | POA: Insufficient documentation

## 2023-09-15 DIAGNOSIS — F17200 Nicotine dependence, unspecified, uncomplicated: Secondary | ICD-10-CM | POA: Diagnosis not present

## 2023-09-15 DIAGNOSIS — Z510 Encounter for antineoplastic radiation therapy: Secondary | ICD-10-CM | POA: Diagnosis not present

## 2023-09-15 DIAGNOSIS — C61 Malignant neoplasm of prostate: Secondary | ICD-10-CM | POA: Diagnosis not present

## 2023-09-15 LAB — CBC WITH DIFFERENTIAL/PLATELET
Abs Immature Granulocytes: 0.02 10*3/uL (ref 0.00–0.07)
Basophils Absolute: 0 10*3/uL (ref 0.0–0.1)
Basophils Relative: 0 %
Eosinophils Absolute: 0.1 10*3/uL (ref 0.0–0.5)
Eosinophils Relative: 2 %
HCT: 40.7 % (ref 39.0–52.0)
Hemoglobin: 14 g/dL (ref 13.0–17.0)
Immature Granulocytes: 0 %
Lymphocytes Relative: 19 %
Lymphs Abs: 1.4 10*3/uL (ref 0.7–4.0)
MCH: 28.3 pg (ref 26.0–34.0)
MCHC: 34.4 g/dL (ref 30.0–36.0)
MCV: 82.4 fL (ref 80.0–100.0)
Monocytes Absolute: 0.8 10*3/uL (ref 0.1–1.0)
Monocytes Relative: 12 %
Neutro Abs: 4.8 10*3/uL (ref 1.7–7.7)
Neutrophils Relative %: 67 %
Platelets: 195 10*3/uL (ref 150–400)
RBC: 4.94 MIL/uL (ref 4.22–5.81)
RDW: 14.1 % (ref 11.5–15.5)
WBC: 7.2 10*3/uL (ref 4.0–10.5)
nRBC: 0 % (ref 0.0–0.2)

## 2023-09-15 LAB — COMPREHENSIVE METABOLIC PANEL
ALT: 31 U/L (ref 0–44)
AST: 25 U/L (ref 15–41)
Albumin: 4.2 g/dL (ref 3.5–5.0)
Alkaline Phosphatase: 59 U/L (ref 38–126)
Anion gap: 15 (ref 5–15)
BUN: 17 mg/dL (ref 8–23)
CO2: 20 mmol/L — ABNORMAL LOW (ref 22–32)
Calcium: 9.4 mg/dL (ref 8.9–10.3)
Chloride: 104 mmol/L (ref 98–111)
Creatinine, Ser: 1.29 mg/dL — ABNORMAL HIGH (ref 0.61–1.24)
GFR, Estimated: >60 mL/min (ref >60)
Glucose, Bld: 156 mg/dL — ABNORMAL HIGH (ref 70–99)
Potassium: 2.8 mmol/L — ABNORMAL LOW (ref 3.5–5.1)
Sodium: 139 mmol/L (ref 135–145)
Total Bilirubin: 0.9 mg/dL (ref <1.2)
Total Protein: 8.1 g/dL (ref 6.5–8.1)

## 2023-09-15 LAB — RAD ONC ARIA SESSION SUMMARY
Course Elapsed Days: 28
Plan Fractions Treated to Date: 20
Plan Prescribed Dose Per Fraction: 2.5 Gy
Plan Total Fractions Prescribed: 28
Plan Total Prescribed Dose: 70 Gy
Reference Point Dosage Given to Date: 50 Gy
Reference Point Session Dosage Given: 2.5 Gy
Session Number: 20

## 2023-09-15 LAB — URINALYSIS, ROUTINE W REFLEX MICROSCOPIC
Bilirubin Urine: NEGATIVE
Glucose, UA: NEGATIVE mg/dL
Hgb urine dipstick: NEGATIVE
Ketones, ur: NEGATIVE mg/dL
Leukocytes,Ua: NEGATIVE
Nitrite: NEGATIVE
Protein, ur: NEGATIVE mg/dL
Specific Gravity, Urine: 1.012 (ref 1.005–1.030)
pH: 5 (ref 5.0–8.0)

## 2023-09-15 MED ORDER — OXYCODONE-ACETAMINOPHEN 5-325 MG PO TABS
1.0000 | ORAL_TABLET | Freq: Once | ORAL | Status: AC
  Administered 2023-09-15: 1 via ORAL
  Filled 2023-09-15: qty 1

## 2023-09-15 MED ORDER — POTASSIUM CHLORIDE CRYS ER 20 MEQ PO TBCR
40.0000 meq | EXTENDED_RELEASE_TABLET | Freq: Once | ORAL | Status: AC
  Administered 2023-09-15: 40 meq via ORAL
  Filled 2023-09-15: qty 2

## 2023-09-15 MED ORDER — POTASSIUM CHLORIDE CRYS ER 20 MEQ PO TBCR: 40.0000 meq | EXTENDED_RELEASE_TABLET | Freq: Every day | ORAL | 0 refills | Status: AC

## 2023-09-15 NOTE — ED Notes (Signed)
Bladder scan 848 ml.

## 2023-09-15 NOTE — ED Triage Notes (Signed)
Patient arrives ambulatory by POV c/o urinary retention since last night 9:30. Patient states every time attempting to void he has diarrhea. Recently started on radiation for prostate cancer.

## 2023-09-15 NOTE — Progress Notes (Signed)
RN received voicemail from patient that he is unable to urinate.  Patient is currently in ED now.  Voicemail left requesting call back once he is discharged.  MD's and radiation department updated.

## 2023-09-15 NOTE — ED Provider Notes (Signed)
[place] EMERGENCY DEPARTMENT AT [facility] Provider Note   CSN: [id number] Arrival date & time: 09/15/23  0857     History  Chief Complaint  Patient presents with   Urinary Retention    Sally Tonie Adkisson is a 62 y.o. male with past medical history significant hypertension, tobacco abuse, emphysema, with malignant neoplasm of prostate presents with concern for severe lower abdominal discomfort, and acute urinary retention since last night.  Recently started on radiation for prostate cancer, endorses acute urinary retention since last night at 930.  Endorses diarrhea with attempted voiding.  Greater than 800 on bladder scan on arrival.  HPI     Home Medications Prior to Admission medications   Medication Sig Start Date End Date Taking? Authorizing Provider  potassium chloride SA (KLOR-CON M) 20 MEQ tablet Take 2 tablets (40 mEq total) by mouth daily. 09/15/23  Yes Enolia Koepke H, PA-C  albuterol (VENTOLIN HFA) 108 (90 Base) MCG/ACT inhaler Inhale 2 puffs into the lungs every 6 (six) hours as needed for wheezing or shortness of breath. Patient taking differently: Inhale 2 puffs into the lungs every 6 (six) hours as needed for wheezing or shortness of breath. 03/18/23   Hoy Register, MD  allopurinol (ZYLOPRIM) 100 MG tablet TAKE 1 TABLET(100 MG) BY MOUTH DAILY Patient taking differently: Take 100 mg by mouth daily. 04/17/23   Hoy Register, MD  amLODipine (NORVASC) 10 MG tablet Take 1 tablet (10 mg total) by mouth daily. 03/18/23   Hoy Register, MD  atorvastatin (LIPITOR) 80 MG tablet TAKE 1 TABLET BY MOUTH EVERY DAY Patient taking differently: Take 80 mg by mouth daily. TAKE 1 TABLET BY MOUTH EVERY DAY 03/18/23   Hoy Register, MD  Blood Glucose Monitoring Suppl (ONE TOUCH ULTRA 2) w/Device KIT Use as directed 3 times daily before meals. 07/06/19   Hoy Register, MD  carvedilol (COREG) 12.5 MG tablet Take 1 tablet (12.5 mg total) by mouth 2 (two) times  daily with a meal. Patient taking differently: Take 12.5 mg by mouth 2 (two) times daily with a meal. 03/18/23   Hoy Register, MD  cetirizine (ZYRTEC ALLERGY) 10 MG tablet Take 1 tablet (10 mg total) by mouth daily. 01/06/23   Mayers, Cari S, PA-C  fluticasone (FLONASE) 50 MCG/ACT nasal spray Place 2 sprays into both nostrils daily. Patient taking differently: Place 2 sprays into both nostrils daily as needed for allergies. 01/06/23   Mayers, Cari S, PA-C  glucose blood (ONETOUCH ULTRA) test strip Use as instructed 3 times daily 07/06/19   Hoy Register, MD  Lancet Devices [facility]) lancets Use as instructed 08/04/15   Ambrose Finland, NP  Lancets [facility] ULTRASOFT) lancets Use as instructed 3 times daily 07/06/19   Hoy Register, MD  metFORMIN (GLUCOPHAGE-XR) 500 MG 24 hr tablet TAKE 1 TABLET(500 MG) BY MOUTH DAILY WITH BREAKFAST Patient taking differently: Take 500 mg by mouth daily with breakfast. TAKE 1 TABLET(500 MG) BY MOUTH DAILY WITH BREAKFAST 03/18/23   Hoy Register, MD  pantoprazole (PROTONIX) 40 MG tablet Take 1 tablet (40 mg total) by mouth daily. Patient taking differently: Take 40 mg by mouth daily. 03/18/23   Hoy Register, MD  tamsulosin (FLOMAX) 0.4 MG CAPS capsule Take 1 capsule (0.4 mg total) by mouth daily after supper. 08/22/23   Margaretmary Dys, MD  traZODone (DESYREL) 50 MG tablet Take 1 tablet (50 mg total) by mouth at bedtime as needed for sleep. 03/18/23   Hoy Register, MD  valsartan-hydrochlorothiazide (DIOVAN-HCT) 320-25  MG tablet Take 1 tablet by mouth daily. Patient taking differently: Take 1 tablet by mouth daily. 03/18/23   Hoy Register, MD  tadalafil (CIALIS) 20 MG tablet Take 0.5-1 tablets (10-20 mg total) by mouth every other day as needed for erectile dysfunction. Patient not taking: Reported on 09/19/2014 07/21/14 07/17/15  Doris Cheadle, MD      Allergies    Patient has no known allergies.    Review of Systems   Review of Systems  All  other systems reviewed and are negative.   Physical Exam Updated Vital Signs BP (!) 152/74   Pulse 67   Temp (!) 97.4 F (36.3 C) (Oral)   Resp 18   Ht 5\' 7"  (1.702 m)   Wt 78 kg   SpO2 98%   BMI 26.94 kg/m  Physical Exam Vitals and nursing note reviewed.  Constitutional:      General: He is not in acute distress.    Appearance: Normal appearance.  HENT:     Head: Normocephalic and atraumatic.  Eyes:     General:        Right eye: No discharge.        Left eye: No discharge.  Cardiovascular:     Rate and Rhythm: Normal rate and regular rhythm.  Pulmonary:     Effort: Pulmonary effort is normal. No respiratory distress.  Abdominal:     Comments: Some swelling and tenderness palpation of the suprapubic region  Genitourinary:    Comments: Normal appearance of penis, testes Musculoskeletal:        General: No deformity.  Skin:    General: Skin is warm and dry.  Neurological:     Mental Status: He is alert and oriented to person, place, and time.  Psychiatric:        Mood and Affect: Mood normal.        Behavior: Behavior normal.     ED Results / Procedures / Treatments   Labs (all labs ordered are listed, but only abnormal results are displayed) Labs Reviewed  COMPREHENSIVE METABOLIC PANEL - Abnormal; Notable for the following components:      Result Value   Potassium 2.8 (*)    CO2 20 (*)    Glucose, Bld 156 (*)    Creatinine, Ser 1.29 (*)    All other components within normal limits  CBC WITH DIFFERENTIAL/PLATELET  URINALYSIS, ROUTINE W REFLEX MICROSCOPIC    EKG None  Radiology No results found.  Procedures Procedures    Medications Ordered in ED Medications  potassium chloride SA (KLOR-CON M) CR tablet 40 mEq (40 mEq Oral Given 09/15/23 1131)  oxyCODONE-acetaminophen (PERCOCET/ROXICET) 5-325 MG per tablet 1 tablet (1 tablet Oral Given 09/15/23 1131)    ED Course/ Medical Decision Making/ A&P                                  Medical  Decision Making Amount and/or Complexity of Data Reviewed Labs: ordered.  Risk Prescription drug management.   This patient is a 62 y.o. male  who presents to the ED for concern of acute urinary retention, lower abdominal pain   Differential diagnoses prior to evaluation: The emergent differential diagnosis includes, but is not limited to, urinary retention, acute urinary tract infection, intra-abdominal abnormality including but not limited to The causes of generalized abdominal pain include but are not limited to AAA, mesenteric ischemia, appendicitis, diverticulitis, DKA, gastritis, gastroenteritis, AMI, nephrolithiasis,  pancreatitis, peritonitis, adrenal insufficiency,lead poisoning, iron toxicity, intestinal ischemia, constipation, SBO/LBO, splenic rupture, biliary disease, IBD, IBS, PUD, or hepatitis. This is not an exhaustive differential.   Past Medical History / Co-morbidities / Social History: hypertension, tobacco abuse, emphysema, with malignant neoplasm of prostate   Additional history: Chart reviewed. Pertinent results include: I reviewed patient's outpatient oncology notes, with recent treatments for radiation  Physical Exam: Physical exam performed. The pertinent findings include: With some tenderness of the lower abdomen, and distention, significant relief of swelling and symptoms after catheter placed.  Normal appearance of penis, vital signs overall stable in the emergency department other than some hypertension, blood pressure 152/74 at time of discharge.  Lab Tests/Imaging studies: I personally interpreted labs/imaging and the pertinent results include: CBC unremarkable, CMP notable for hypokalemia, testing 2.8, likely secondary to the diarrhea that patient had described which is presumed secondary to his recent radiation, UA unremarkable.  We will replete potassium, as patient has an oncology appointment earlier today after 40 mill equivalents of oral potassium plan to  discharge him on oral potassium for home, and encourage close recheck in lieu of IV potassium in the emergency department today.. I agree with the radiologist interpretation.  Cardiac monitoring: EKG obtained and interpreted by myself and attending physician which shows: No EKG changes of hyperkalemia noted   Medications: I ordered medication including Percocet for pain, potassium for hypokalemia.  I have reviewed the patients home medicines and have made adjustments as needed.   Disposition: After consideration of the diagnostic results and the patients response to treatment, I feel that patient stable for discharge, will follow-up closely with oncologist, urologist.   emergency department workup does not suggest an emergent condition requiring admission or immediate intervention beyond what has been performed at this time. The plan is: as above. The patient is safe for discharge and has been instructed to return immediately for worsening symptoms, change in symptoms or any other concerns.  Final Clinical Impression(s) / ED Diagnoses Final diagnoses:  Urinary retention  Hypokalemia    Rx / DC Orders ED Discharge Orders          Ordered    potassium chloride SA (KLOR-CON M) 20 MEQ tablet  Daily        09/15/23 1109              Olene Floss, PA-C 09/15/23 1220    Alvira Monday, MD 09/16/23 2308

## 2023-09-15 NOTE — Discharge Instructions (Addendum)
In addition to urinary retention your potassium was low, likely because of the diarrhea that you described, I have prescribed some potassium supplementation for you to take daily for the next few days, and then I recommend that you follow-up closely with your oncologist or primary care doctor to recheck your potassium within the next 1 to 2 weeks.  Please go to your oncologist after you leave here.

## 2023-09-16 ENCOUNTER — Other Ambulatory Visit: Payer: Self-pay | Admitting: Family Medicine

## 2023-09-16 ENCOUNTER — Other Ambulatory Visit: Payer: Self-pay

## 2023-09-16 ENCOUNTER — Ambulatory Visit
Admission: RE | Admit: 2023-09-16 | Discharge: 2023-09-16 | Discharge status: Home / Self Care | Payer: 59 | Source: Ambulatory Visit | Attending: Radiation Oncology

## 2023-09-16 DIAGNOSIS — Z191 Hormone sensitive malignancy status: Secondary | ICD-10-CM | POA: Diagnosis not present

## 2023-09-16 DIAGNOSIS — Z510 Encounter for antineoplastic radiation therapy: Secondary | ICD-10-CM | POA: Diagnosis not present

## 2023-09-16 DIAGNOSIS — R310 Gross hematuria: Secondary | ICD-10-CM | POA: Diagnosis not present

## 2023-09-16 DIAGNOSIS — I152 Hypertension secondary to endocrine disorders: Secondary | ICD-10-CM

## 2023-09-16 LAB — RAD ONC ARIA SESSION SUMMARY
Course Elapsed Days: 29
Plan Fractions Treated to Date: 21
Plan Prescribed Dose Per Fraction: 2.5 Gy
Plan Total Fractions Prescribed: 28
Plan Total Prescribed Dose: 70 Gy
Reference Point Dosage Given to Date: 52.5 Gy
Reference Point Session Dosage Given: 2.5 Gy
Session Number: 21

## 2023-09-17 ENCOUNTER — Other Ambulatory Visit: Payer: Self-pay

## 2023-09-17 ENCOUNTER — Ambulatory Visit
Admission: RE | Admit: 2023-09-17 | Discharge: 2023-09-17 | Disposition: A | Discharge status: Home / Self Care | Payer: 59 | Source: Ambulatory Visit | Attending: Radiation Oncology | Admitting: Radiation Oncology

## 2023-09-17 DIAGNOSIS — Z191 Hormone sensitive malignancy status: Secondary | ICD-10-CM | POA: Diagnosis not present

## 2023-09-17 DIAGNOSIS — Z510 Encounter for antineoplastic radiation therapy: Secondary | ICD-10-CM | POA: Diagnosis not present

## 2023-09-17 DIAGNOSIS — R310 Gross hematuria: Secondary | ICD-10-CM | POA: Diagnosis not present

## 2023-09-17 LAB — RAD ONC ARIA SESSION SUMMARY
Course Elapsed Days: 30
Plan Fractions Treated to Date: 22
Plan Prescribed Dose Per Fraction: 2.5 Gy
Plan Total Fractions Prescribed: 28
Plan Total Prescribed Dose: 70 Gy
Reference Point Dosage Given to Date: 55 Gy
Reference Point Session Dosage Given: 2.5 Gy
Session Number: 22

## 2023-09-18 ENCOUNTER — Other Ambulatory Visit: Payer: Self-pay

## 2023-09-18 ENCOUNTER — Other Ambulatory Visit: Payer: Self-pay | Admitting: Radiation Oncology

## 2023-09-18 ENCOUNTER — Ambulatory Visit
Admission: RE | Admit: 2023-09-18 | Discharge: 2023-09-18 | Disposition: A | Discharge status: Home / Self Care | Payer: 59 | Source: Ambulatory Visit | Attending: Radiation Oncology | Admitting: Radiation Oncology

## 2023-09-18 ENCOUNTER — Ambulatory Visit: Payer: Medicare Other | Admitting: Family Medicine

## 2023-09-18 DIAGNOSIS — R310 Gross hematuria: Secondary | ICD-10-CM

## 2023-09-18 DIAGNOSIS — Z191 Hormone sensitive malignancy status: Secondary | ICD-10-CM | POA: Diagnosis not present

## 2023-09-18 DIAGNOSIS — Z510 Encounter for antineoplastic radiation therapy: Secondary | ICD-10-CM | POA: Diagnosis not present

## 2023-09-18 LAB — RAD ONC ARIA SESSION SUMMARY
Course Elapsed Days: 31
Plan Fractions Treated to Date: 23
Plan Prescribed Dose Per Fraction: 2.5 Gy
Plan Total Fractions Prescribed: 28
Plan Total Prescribed Dose: 70 Gy
Reference Point Dosage Given to Date: 57.5 Gy
Reference Point Session Dosage Given: 2.5 Gy
Session Number: 23

## 2023-09-18 LAB — URINALYSIS, COMPLETE (UACMP) WITH MICROSCOPIC
Bilirubin Urine: NEGATIVE
Glucose, UA: NEGATIVE mg/dL
Ketones, ur: NEGATIVE mg/dL
Nitrite: NEGATIVE
Protein, ur: 100 mg/dL — AB
RBC / HPF: >50 RBC/hpf (ref 0–5)
Specific Gravity, Urine: 1.016 (ref 1.005–1.030)
pH: 5 (ref 5.0–8.0)

## 2023-09-18 MED ORDER — CIPROFLOXACIN HCL 500 MG PO TABS: 500.0000 mg | ORAL_TABLET | Freq: Two times a day (BID) | ORAL | 0 refills | Status: DC

## 2023-09-18 MED ORDER — OXYCODONE HCL 5 MG PO TABS: 5.0000 mg | ORAL_TABLET | Freq: Four times a day (QID) | ORAL | 0 refills | Status: DC | PRN

## 2023-09-18 NOTE — Progress Notes (Signed)
Patient is scheduled to follow up with Urology on Monday 11/25 @ 1pm due to recent episode of urinary retention with cath placement from ED.  Patient aware of appointment time and date.

## 2023-09-19 ENCOUNTER — Other Ambulatory Visit: Payer: Self-pay

## 2023-09-19 ENCOUNTER — Ambulatory Visit: Payer: 59

## 2023-09-19 ENCOUNTER — Ambulatory Visit
Admission: RE | Admit: 2023-09-19 | Discharge: 2023-09-19 | Disposition: A | Discharge status: Home / Self Care | Payer: 59 | Source: Ambulatory Visit | Attending: Radiation Oncology

## 2023-09-19 DIAGNOSIS — Z510 Encounter for antineoplastic radiation therapy: Secondary | ICD-10-CM | POA: Diagnosis not present

## 2023-09-19 DIAGNOSIS — Z191 Hormone sensitive malignancy status: Secondary | ICD-10-CM | POA: Diagnosis not present

## 2023-09-19 DIAGNOSIS — R310 Gross hematuria: Secondary | ICD-10-CM | POA: Diagnosis not present

## 2023-09-19 LAB — RAD ONC ARIA SESSION SUMMARY
Course Elapsed Days: 32
Plan Fractions Treated to Date: 24
Plan Prescribed Dose Per Fraction: 2.5 Gy
Plan Total Fractions Prescribed: 28
Plan Total Prescribed Dose: 70 Gy
Reference Point Dosage Given to Date: 60 Gy
Reference Point Session Dosage Given: 2.5 Gy
Session Number: 24

## 2023-09-20 LAB — URINE CULTURE: Culture: 20000 — AB

## 2023-09-21 ENCOUNTER — Ambulatory Visit
Admission: RE | Admit: 2023-09-21 | Discharge: 2023-09-21 | Disposition: A | Discharge status: Home / Self Care | Payer: 59 | Source: Ambulatory Visit | Attending: Radiation Oncology | Admitting: Radiation Oncology

## 2023-09-21 ENCOUNTER — Other Ambulatory Visit: Payer: Self-pay

## 2023-09-21 DIAGNOSIS — R310 Gross hematuria: Secondary | ICD-10-CM | POA: Diagnosis not present

## 2023-09-21 DIAGNOSIS — Z191 Hormone sensitive malignancy status: Secondary | ICD-10-CM | POA: Diagnosis not present

## 2023-09-21 DIAGNOSIS — Z510 Encounter for antineoplastic radiation therapy: Secondary | ICD-10-CM | POA: Diagnosis not present

## 2023-09-21 LAB — RAD ONC ARIA SESSION SUMMARY
Course Elapsed Days: 34
Plan Fractions Treated to Date: 25
Plan Prescribed Dose Per Fraction: 2.5 Gy
Plan Total Fractions Prescribed: 28
Plan Total Prescribed Dose: 70 Gy
Reference Point Dosage Given to Date: 62.5 Gy
Reference Point Session Dosage Given: 2.5 Gy
Session Number: 25

## 2023-09-22 ENCOUNTER — Other Ambulatory Visit: Payer: Self-pay

## 2023-09-22 ENCOUNTER — Encounter: Payer: Self-pay | Admitting: Family Medicine

## 2023-09-22 ENCOUNTER — Ambulatory Visit
Admission: RE | Admit: 2023-09-22 | Discharge: 2023-09-22 | Disposition: A | Discharge status: Home / Self Care | Payer: 59 | Source: Ambulatory Visit | Attending: Radiation Oncology | Admitting: Radiation Oncology

## 2023-09-22 ENCOUNTER — Ambulatory Visit: Payer: 59 | Attending: Family Medicine | Admitting: Family Medicine

## 2023-09-22 VITALS — BP 98/63 | HR 66 | Ht 67.0 in | Wt 172.4 lb

## 2023-09-22 DIAGNOSIS — E1169 Type 2 diabetes mellitus with other specified complication: Secondary | ICD-10-CM | POA: Diagnosis not present

## 2023-09-22 DIAGNOSIS — C61 Malignant neoplasm of prostate: Secondary | ICD-10-CM

## 2023-09-22 DIAGNOSIS — E876 Hypokalemia: Secondary | ICD-10-CM

## 2023-09-22 DIAGNOSIS — R338 Other retention of urine: Secondary | ICD-10-CM | POA: Diagnosis not present

## 2023-09-22 DIAGNOSIS — G4709 Other insomnia: Secondary | ICD-10-CM

## 2023-09-22 DIAGNOSIS — Z7984 Long term (current) use of oral hypoglycemic drugs: Secondary | ICD-10-CM | POA: Diagnosis not present

## 2023-09-22 DIAGNOSIS — F17210 Nicotine dependence, cigarettes, uncomplicated: Secondary | ICD-10-CM

## 2023-09-22 DIAGNOSIS — K219 Gastro-esophageal reflux disease without esophagitis: Secondary | ICD-10-CM | POA: Diagnosis not present

## 2023-09-22 DIAGNOSIS — E1159 Type 2 diabetes mellitus with other circulatory complications: Secondary | ICD-10-CM

## 2023-09-22 DIAGNOSIS — Z23 Encounter for immunization: Secondary | ICD-10-CM | POA: Diagnosis not present

## 2023-09-22 DIAGNOSIS — I152 Hypertension secondary to endocrine disorders: Secondary | ICD-10-CM | POA: Diagnosis not present

## 2023-09-22 DIAGNOSIS — M1A0710 Idiopathic chronic gout, right ankle and foot, without tophus (tophi): Secondary | ICD-10-CM

## 2023-09-22 DIAGNOSIS — E785 Hyperlipidemia, unspecified: Secondary | ICD-10-CM | POA: Diagnosis not present

## 2023-09-22 DIAGNOSIS — Z191 Hormone sensitive malignancy status: Secondary | ICD-10-CM | POA: Diagnosis not present

## 2023-09-22 DIAGNOSIS — R310 Gross hematuria: Secondary | ICD-10-CM | POA: Diagnosis not present

## 2023-09-22 DIAGNOSIS — Z510 Encounter for antineoplastic radiation therapy: Secondary | ICD-10-CM | POA: Diagnosis not present

## 2023-09-22 LAB — RAD ONC ARIA SESSION SUMMARY
Course Elapsed Days: 35
Plan Fractions Treated to Date: 26
Plan Prescribed Dose Per Fraction: 2.5 Gy
Plan Total Fractions Prescribed: 28
Plan Total Prescribed Dose: 70 Gy
Reference Point Dosage Given to Date: 65 Gy
Reference Point Session Dosage Given: 2.5 Gy
Session Number: 26

## 2023-09-22 LAB — POCT GLYCOSYLATED HEMOGLOBIN (HGB A1C): HbA1c, POC (controlled diabetic range): 6.9 % (ref 0.0–7.0)

## 2023-09-22 MED ORDER — AMLODIPINE BESYLATE 10 MG PO TABS: 10.0000 mg | ORAL_TABLET | Freq: Every day | ORAL | 1 refills | Status: DC

## 2023-09-22 MED ORDER — TRAZODONE HCL 50 MG PO TABS: 50.0000 mg | ORAL_TABLET | Freq: Every evening | ORAL | 1 refills | Status: DC | PRN

## 2023-09-22 MED ORDER — VALSARTAN-HYDROCHLOROTHIAZIDE 320-25 MG PO TABS: 1.0000 | ORAL_TABLET | Freq: Every day | ORAL | 1 refills | Status: DC

## 2023-09-22 MED ORDER — PANTOPRAZOLE SODIUM 40 MG PO TBEC: 40.0000 mg | DELAYED_RELEASE_TABLET | Freq: Every day | ORAL | 1 refills | Status: DC

## 2023-09-22 MED ORDER — ATORVASTATIN CALCIUM 80 MG PO TABS: ORAL_TABLET | ORAL | 1 refills | Status: DC

## 2023-09-22 MED ORDER — CARVEDILOL 12.5 MG PO TABS: 12.5000 mg | ORAL_TABLET | Freq: Two times a day (BID) | ORAL | 1 refills | Status: DC

## 2023-09-22 MED ORDER — ALLOPURINOL 100 MG PO TABS: 100.0000 mg | ORAL_TABLET | Freq: Every day | ORAL | 1 refills | Status: DC

## 2023-09-22 MED ORDER — METFORMIN HCL ER 500 MG PO TB24: 500.0000 mg | ORAL_TABLET | Freq: Every day | ORAL | 1 refills | Status: DC

## 2023-09-22 NOTE — Progress Notes (Signed)
Patient followed up with [facility] today for ongoing management for recent catheter.   Patient re-started Tamsulosin and added Pyridium as needed for burning.  Continue Cipro as prescribed.    Patient will return in 2 weeks for voiding trial.

## 2023-09-22 NOTE — Patient Instructions (Signed)
VISIT SUMMARY:  During today's visit, we discussed your ongoing treatment for early-stage prostate cancer, your recent urinary symptoms, and other health concerns including your smoking habits and general health maintenance.  YOUR PLAN:  -PROSTATE CANCER: Prostate cancer is a type of cancer that occurs in the prostate gland. You are currently undergoing radiation therapy and have two more days left. Please continue with the remaining sessions and address any urinary symptoms with your urologist or oncologist.  -URINARY TRACT INFECTION: A urinary tract infection (UTI) is an infection in any part of your urinary system. You were recently treated with Ciprofloxacin. Please monitor your symptoms and follow up with your urologist or oncologist if they persist.  -CONSTIPATION: Constipation is a condition where you have difficulty emptying your bowels. It is likely due to your radiation therapy. Continue taking Miralax as needed.  -TOBACCO USE: Smoking can cause numerous health issues. You are currently smoking half a pack per day and have expressed interest in quitting. Please continue your efforts to quit smoking.  -HYPOKALEMIA: Hypokalemia is a condition where you have low potassium levels. You are currently on potassium supplementation. A blood test has been ordered to check your potassium levels on 09/29/2023.  -GENERAL HEALTH MAINTENANCE: We administered your influenza vaccine today. Please schedule a follow-up appointment in six months. Additionally, a CT scan of your lungs has been ordered for December to monitor for potential smoking-related damage.  INSTRUCTIONS:  Please continue with your remaining radiation therapy sessions and monitor your urinary symptoms. Follow up with your urologist or oncologist if symptoms persist. A blood test to check your potassium levels has been ordered for 09/29/2023. Schedule a follow-up appointment in six months and a CT scan of your lungs in December.

## 2023-09-22 NOTE — Progress Notes (Signed)
Subjective:  Patient ID: Henry Walker, male    DOB: [birth year]  Age: 62 y.o. MRN: [id number]  CC: Medical Management of Chronic Issues   HPI Henry Walker is a 62 y.o. year old male with a history of type 2 diabetes mellitus (A1c 6.9), hypertension, hyperlipidemia, GERD, gout, emphysema and tobacco abuse (0.5 ppd since he was 14) who presents for follow-up visit     Interval History: Discussed the use of AI scribe software for clinical note transcription with the patient, who gave verbal consent to proceed.  He presents with recent diagnosis of early-stage prostate cancer. The patient is currently undergoing radiation treatment and has two more days of treatment left. The patient reports having a burning sensation during urination and has a catheter in place and his urologist prescribed an antibiotic for him. The patient's appetite has decreased over the course of the treatment, and he reports eating only once a day. The patient also reports some fatigue, which he attributes to the daily radiation treatments.  The patient is a smoker and continues to smoke half a pack of cigarettes a day. The patient also has a history of acid reflux which is controlled and takes trazodone for sleep. The patient's diabetes is well-controlled, with a recent A1c of 6.9. The patient also has a history of gout, which he manages by moderating his intake of beef.  His blood pressure is controlled on his antihypertensives.       Past Medical History:  Diagnosis Date   Benign localized prostatic hyperplasia with lower urinary tract symptoms (LUTS)    Chronic gout    ED (erectile dysfunction)    Emphysema of lung (HCC)    Full dentures    GERD (gastroesophageal reflux disease)    Hx of adenomatous colonic polyps 10/06/2014   Hyperlipidemia    Hypertension    Malignant neoplasm prostate (HCC) 05/2023   urologist--- dr pace/  radiation onologist--- dr Kathrynn Running;  dx 08/ 2024, gleason 3 +4    Type 2 diabetes mellitus (HCC)    followed by pcp;    (07-17-2023  per pt checks blood sugar every other day)    Past Surgical History:  Procedure Laterality Date   COLONOSCOPY WITH PROPOFOL  05/19/2019   dr Leone Payor   GOLD SEED IMPLANT N/A 07/25/2023   Procedure: GOLD SEED IMPLANT;  Surgeon: Jannifer Hick, MD;  Location: [facility];  Service: Urology;  Laterality: N/A;   POSTERIOR FUSION LUMBAR SPINE  09/03/2006   @[facility]  by dr Judie Petit. Noel Gerold;  laminectomy/  disectomy/ fusion L5--S1   SPACE OAR INSTILLATION N/A 07/25/2023   Procedure: SPACE OAR INSTILLATION;  Surgeon: Jannifer Hick, MD;  Location: [facility];  Service: Urology;  Laterality: N/A;    Family History  Problem Relation Age of Onset   Hypertension Mother    Cirrhosis Father    Hypertension Sister    Stroke Brother    Colon cancer Neg Hx    Esophageal cancer Neg Hx    Rectal cancer Neg Hx    Stomach cancer Neg Hx    Colon polyps Neg Hx     Social History   Socioeconomic History   Marital status: Single    Spouse name: Not on file   Number of children: Not on file   Years of education: Not on file   Highest education level: Not on file  Occupational History   Not on file  Tobacco Use   Smoking status:  Every Day    Current packs/day: 0.50    Types: Cigarettes   Smokeless tobacco: Never   Tobacco comments:    07-17-2023  per pt smoked 1/2 ppd,  smoked since age 36  Vaping Use   Vaping status: Never Used  Substance and Sexual Activity   Alcohol use: No    Alcohol/week: 0.0 standard drinks of alcohol   Drug use: Never   Sexual activity: Not on file  Other Topics Concern   Not on file  Social History Narrative   Retired Customer service manager (gas, Mining engineer, cable)   Social Determinants of Health   Financial Resource Strain: Medium Risk (09/22/2023)   Overall Financial Resource Strain (CARDIA)    Difficulty of Paying Living Expenses: Somewhat hard  Food Insecurity: No Food  Insecurity (09/22/2023)   Hunger Vital Sign    Worried About Running Out of Food in the Last Year: Never true    Ran Out of Food in the Last Year: Never true  Recent Concern: Food Insecurity - Food Insecurity Present (07/15/2023)   Hunger Vital Sign    Worried About Running Out of Food in the Last Year: Often true    Ran Out of Food in the Last Year: Sometimes true  Transportation Needs: No Transportation Needs (09/22/2023)   PRAPARE - Administrator, Civil Service (Medical): No    Lack of Transportation (Non-Medical): No  Recent Concern: Transportation Needs - Unmet Transportation Needs (08/07/2023)   PRAPARE - Transportation    Lack of Transportation (Medical): Yes    Lack of Transportation (Non-Medical): Yes  Physical Activity: Sufficiently Active (09/22/2023)   Exercise Vital Sign    Days of Exercise per Week: 6 days    Minutes of Exercise per Session: 30 min  Stress: No Stress Concern Present (09/22/2023)   [employer] of Occupational Health - Occupational Stress Questionnaire    Feeling of Stress : Not at all  Social Connections: Moderately Integrated (09/22/2023)   Social Connection and Isolation Panel [NHANES]    Frequency of Communication with Friends and Family: More than three times a week    Frequency of Social Gatherings with Friends and Family: More than three times a week    Attends Religious Services: More than 4 times per year    Active Member of [employer] or Organizations: Yes    Attends Banker Meetings: Never    Marital Status: Divorced    No Known Allergies  Outpatient Medications Prior to Visit  Medication Sig Dispense Refill   albuterol (VENTOLIN HFA) 108 (90 Base) MCG/ACT inhaler Inhale 2 puffs into the lungs every 6 (six) hours as needed for wheezing or shortness of breath. (Patient taking differently: Inhale 2 puffs into the lungs every 6 (six) hours as needed for wheezing or shortness of breath.) 8 g 2   Blood Glucose  Monitoring Suppl (ONE TOUCH ULTRA 2) w/Device KIT Use as directed 3 times daily before meals. 1 kit 0   cetirizine (ZYRTEC ALLERGY) 10 MG tablet Take 1 tablet (10 mg total) by mouth daily. 30 tablet 11   ciprofloxacin (CIPRO) 500 MG tablet Take 1 tablet (500 mg total) by mouth 2 (two) times daily. 20 tablet 0   fluticasone (FLONASE) 50 MCG/ACT nasal spray Place 2 sprays into both nostrils daily. (Patient taking differently: Place 2 sprays into both nostrils daily as needed for allergies.) 16 g 6   glucose blood (ONETOUCH ULTRA) test strip Use as instructed 3 times daily 100 each 12  Lancet Devices (ACCU-CHEK SOFTCLIX) lancets Use as instructed 1 each 0   Lancets (ONETOUCH ULTRASOFT) lancets Use as instructed 3 times daily 100 each 12   oxyCODONE (OXY IR/ROXICODONE) 5 MG immediate release tablet Take 1-2 tablets (5-10 mg total) by mouth every 6 (six) hours as needed for severe pain (pain score 7-10). 45 tablet 0   phenazopyridine (PYRIDIUM) 200 MG tablet Take 200 mg by mouth 3 (three) times daily as needed for pain.     potassium chloride SA (KLOR-CON M) 20 MEQ tablet Take 2 tablets (40 mEq total) by mouth daily. 14 tablet 0   tamsulosin (FLOMAX) 0.4 MG CAPS capsule Take 1 capsule (0.4 mg total) by mouth daily after supper. 30 capsule 5   allopurinol (ZYLOPRIM) 100 MG tablet TAKE 1 TABLET(100 MG) BY MOUTH DAILY (Patient taking differently: Take 100 mg by mouth daily.) 90 tablet 1   amLODipine (NORVASC) 10 MG tablet TAKE 1 TABLET(10 MG) BY MOUTH DAILY 30 tablet 0   atorvastatin (LIPITOR) 80 MG tablet TAKE 1 TABLET BY MOUTH EVERY DAY (Patient taking differently: Take 80 mg by mouth daily. TAKE 1 TABLET BY MOUTH EVERY DAY) 90 tablet 1   carvedilol (COREG) 12.5 MG tablet Take 1 tablet (12.5 mg total) by mouth 2 (two) times daily with a meal. (Patient taking differently: Take 12.5 mg by mouth 2 (two) times daily with a meal.) 180 tablet 1   metFORMIN (GLUCOPHAGE-XR) 500 MG 24 hr tablet TAKE 1 TABLET(500  MG) BY MOUTH DAILY WITH BREAKFAST (Patient taking differently: Take 500 mg by mouth daily with breakfast. TAKE 1 TABLET(500 MG) BY MOUTH DAILY WITH BREAKFAST) 90 tablet 1   pantoprazole (PROTONIX) 40 MG tablet Take 1 tablet (40 mg total) by mouth daily. (Patient taking differently: Take 40 mg by mouth daily.) 90 tablet 1   traZODone (DESYREL) 50 MG tablet Take 1 tablet (50 mg total) by mouth at bedtime as needed for sleep. 90 tablet 1   valsartan-hydrochlorothiazide (DIOVAN-HCT) 320-25 MG tablet TAKE 1 TABLET BY MOUTH DAILY 30 tablet 0   No facility-administered medications prior to visit.     ROS Review of Systems  Constitutional:  Negative for activity change and appetite change.  HENT:  Negative for sinus pressure and sore throat.   Respiratory:  Negative for chest tightness, shortness of breath and wheezing.   Cardiovascular:  Negative for chest pain and palpitations.  Gastrointestinal:  Negative for abdominal distention, abdominal pain and constipation.  Genitourinary: Negative.   Musculoskeletal: Negative.   Psychiatric/Behavioral:  Negative for behavioral problems and dysphoric mood.     Objective:  BP 98/63   Pulse 66   Ht 5\' 7"  (1.702 m)   Wt 172 lb 6.4 oz (78.2 kg)   SpO2 96%   BMI 27.00 kg/m      09/22/2023    3:02 PM 09/15/2023   11:30 AM 09/15/2023   11:03 AM  BP/Weight  Systolic BP 98 152 144  Diastolic BP 63 74 76  Wt. (Lbs) 172.4    BMI 27 kg/m2        Physical Exam Constitutional:      Appearance: He is well-developed.  Cardiovascular:     Rate and Rhythm: Normal rate.     Heart sounds: Normal heart sounds. No murmur heard. Pulmonary:     Effort: Pulmonary effort is normal.     Breath sounds: Normal breath sounds. No wheezing or rales.  Chest:     Chest wall: No tenderness.  Abdominal:  General: Bowel sounds are normal. There is no distension.     Palpations: Abdomen is soft. There is no mass.     Tenderness: There is no abdominal  tenderness.  Musculoskeletal:        General: Normal range of motion.     Right lower leg: No edema.     Left lower leg: No edema.  Neurological:     Mental Status: He is alert and oriented to person, place, and time.  Psychiatric:        Mood and Affect: Mood normal.        Latest Ref Rng & Units 09/15/2023    9:20 AM 07/25/2023    7:22 AM 04/04/2023    1:56 PM  CMP  Glucose 70 - 99 mg/dL 259  563    BUN 8 - 23 mg/dL 17  15    Creatinine 8.75 - 1.24 mg/dL 6.43  3.29    Sodium 518 - 145 mmol/L 139  141    Potassium 3.5 - 5.1 mmol/L 2.8  3.7    Chloride 98 - 111 mmol/L 104  101    CO2 22 - 32 mmol/L 20     Calcium 8.9 - 10.3 mg/dL 9.4     Total Protein 6.5 - 8.1 g/dL 8.1   6.9   Total Bilirubin <1.2 mg/dL 0.9   0.6   Alkaline Phos 38 - 126 U/L 59   72   AST 15 - 41 U/L 25   21   ALT 0 - 44 U/L 31   27     Lipid Panel     Component Value Date/Time   CHOL 132 02/07/2022 1425   TRIG 89 02/07/2022 1425   HDL 29 (L) 02/07/2022 1425   CHOLHDL 4.6 02/07/2022 1425   CHOLHDL 5.9 (H) 01/05/2016 1155   VLDL 30 01/05/2016 1155   LDLCALC 86 02/07/2022 1425    CBC    Component Value Date/Time   WBC 7.2 09/15/2023 0920   RBC 4.94 09/15/2023 0920   HGB 14.0 09/15/2023 0920   HGB 15.1 02/07/2022 1425   HCT 40.7 09/15/2023 0920   HCT 44.9 02/07/2022 1425   PLT 195 09/15/2023 0920   PLT 315 02/07/2022 1425   MCV 82.4 09/15/2023 0920   MCV 81 02/07/2022 1425   MCH 28.3 09/15/2023 0920   MCHC 34.4 09/15/2023 0920   RDW 14.1 09/15/2023 0920   RDW 13.7 02/07/2022 1425   LYMPHSABS 1.4 09/15/2023 0920   LYMPHSABS 3.8 (H) 02/07/2022 1425   MONOABS 0.8 09/15/2023 0920   EOSABS 0.1 09/15/2023 0920   EOSABS 0.2 02/07/2022 1425   BASOSABS 0.0 09/15/2023 0920   BASOSABS 0.1 02/07/2022 1425    Lab Results  Component Value Date   HGBA1C 6.9 09/22/2023    Assessment & Plan:      Prostate Cancer Undergoing radiation therapy with two days remaining. Experiencing urinary  symptoms including burning sensation during urination and requiring a catheter. -Continue with the remaining radiation therapy sessions. -Prescribed ciprofloxacin by his urologist  Urinary Tract Infection Recently treated with Ciprofloxacin. -Monitor symptoms and follow up with urologist/oncologist if symptoms persist.  Constipation Likely secondary to radiation therapy. Currently managed with Miralax. -Continue Miralax as needed.  Tobacco Use Currently smoking half a pack per day. Expressed interest in quitting. -Encourage continued efforts to quit smoking. -He is due for low-dose chest CT for lung cancer screening  Hypokalemia Recent low potassium levels. Currently on potassium supplementation. -Order blood test to  check potassium levels on 09/29/2023.   Type 2 diabetes mellitus -Controlled with A1c of 6.9 -Continue metformin -Counseled on Diabetic diet, my plate method, 161 minutes of moderate intensity exercise/week Blood sugar logs with fasting goals of 80-120 mg/dl, random of less than 096 and in the event of sugars less than 60 mg/dl or greater than 045 mg/dl encouraged to notify the clinic. Advised on the need for annual eye exams, annual foot exams, Pneumonia vaccine.  General Health Maintenance -Administer influenza vaccine today. -Schedule follow-up appointment in six months.         Meds ordered this encounter  Medications   allopurinol (ZYLOPRIM) 100 MG tablet    Sig: Take 1 tablet (100 mg total) by mouth daily.    Dispense:  90 tablet    Refill:  1   amLODipine (NORVASC) 10 MG tablet    Sig: Take 1 tablet (10 mg total) by mouth daily.    Dispense:  90 tablet    Refill:  1   atorvastatin (LIPITOR) 80 MG tablet    Sig: TAKE 1 TABLET BY MOUTH EVERY DAY    Dispense:  90 tablet    Refill:  1   carvedilol (COREG) 12.5 MG tablet    Sig: Take 1 tablet (12.5 mg total) by mouth 2 (two) times daily with a meal.    Dispense:  180 tablet    Refill:  1    metFORMIN (GLUCOPHAGE-XR) 500 MG 24 hr tablet    Sig: Take 1 tablet (500 mg total) by mouth daily with breakfast. TAKE 1 TABLET(500 MG) BY MOUTH DAILY WITH BREAKFAST    Dispense:  90 tablet    Refill:  1   pantoprazole (PROTONIX) 40 MG tablet    Sig: Take 1 tablet (40 mg total) by mouth daily.    Dispense:  90 tablet    Refill:  1   traZODone (DESYREL) 50 MG tablet    Sig: Take 1 tablet (50 mg total) by mouth at bedtime as needed for sleep.    Dispense:  90 tablet    Refill:  1   valsartan-hydrochlorothiazide (DIOVAN-HCT) 320-25 MG tablet    Sig: Take 1 tablet by mouth daily.    Dispense:  90 tablet    Refill:  1    Follow-up: Return in about 6 months (around 03/21/2024) for Chronic medical conditions.       Hoy Register, MD, FAAFP. [facility] [place], [place] [phone]   09/22/2023, 5:30 PM

## 2023-09-23 ENCOUNTER — Other Ambulatory Visit: Payer: Self-pay

## 2023-09-23 ENCOUNTER — Ambulatory Visit
Admission: RE | Admit: 2023-09-23 | Discharge: 2023-09-23 | Disposition: A | Discharge status: Home / Self Care | Payer: 59 | Source: Ambulatory Visit | Attending: Radiation Oncology

## 2023-09-23 DIAGNOSIS — R310 Gross hematuria: Secondary | ICD-10-CM | POA: Diagnosis not present

## 2023-09-23 DIAGNOSIS — Z191 Hormone sensitive malignancy status: Secondary | ICD-10-CM | POA: Diagnosis not present

## 2023-09-23 DIAGNOSIS — Z510 Encounter for antineoplastic radiation therapy: Secondary | ICD-10-CM | POA: Diagnosis not present

## 2023-09-23 LAB — RAD ONC ARIA SESSION SUMMARY
Course Elapsed Days: 36
Plan Fractions Treated to Date: 27
Plan Prescribed Dose Per Fraction: 2.5 Gy
Plan Total Fractions Prescribed: 28
Plan Total Prescribed Dose: 70 Gy
Reference Point Dosage Given to Date: 67.5 Gy
Reference Point Session Dosage Given: 2.5 Gy
Session Number: 27

## 2023-09-24 ENCOUNTER — Ambulatory Visit: Payer: 59

## 2023-09-24 ENCOUNTER — Other Ambulatory Visit: Payer: Self-pay

## 2023-09-24 ENCOUNTER — Ambulatory Visit
Admission: RE | Admit: 2023-09-24 | Discharge: 2023-09-24 | Disposition: A | Discharge status: Home / Self Care | Payer: 59 | Source: Ambulatory Visit | Attending: Radiation Oncology

## 2023-09-24 DIAGNOSIS — Z191 Hormone sensitive malignancy status: Secondary | ICD-10-CM | POA: Diagnosis not present

## 2023-09-24 DIAGNOSIS — Z510 Encounter for antineoplastic radiation therapy: Secondary | ICD-10-CM | POA: Diagnosis not present

## 2023-09-24 DIAGNOSIS — R310 Gross hematuria: Secondary | ICD-10-CM | POA: Diagnosis not present

## 2023-09-24 LAB — RAD ONC ARIA SESSION SUMMARY
Course Elapsed Days: 37
Plan Fractions Treated to Date: 28
Plan Prescribed Dose Per Fraction: 2.5 Gy
Plan Total Fractions Prescribed: 28
Plan Total Prescribed Dose: 70 Gy
Reference Point Dosage Given to Date: 70 Gy
Reference Point Session Dosage Given: 2.5 Gy
Session Number: 28

## 2023-09-25 ENCOUNTER — Ambulatory Visit: Payer: 59

## 2023-09-29 ENCOUNTER — Ambulatory Visit: Payer: 59

## 2023-09-29 NOTE — Progress Notes (Signed)
Patient was a RadOnc Consult on 07/15/23 for his stage T1c adenocarcinoma of the prostate with Gleason score of 3+4, and PSA of 6.9.  Patient proceed with treatment recommendations of 5.5 weeks of external beam therapy and had his final radiation treatment on 09/24/23.   Patient is scheduled for a post treatment nurse call on 12/31 and will follow up with urology on 12/11 for a voiding trial followed by ongoing management for his post treatment PSA's.

## 2023-09-29 NOTE — Radiation Completion Notes (Signed)
Patient Name: Henry Walker, Henry Walker MRN: [id number] Date of Birth: [birth year] Referring Physician: Kasandra Knudsen, M.D. Date of Service: 2023-09-29 Radiation Oncologist: Margaretmary Bayley, M.D. [facility]                             RADIATION ONCOLOGY END OF TREATMENT NOTE     Diagnosis: C61 Malignant neoplasm of prostate Staging on 2023-06-20: Malignant neoplasm of prostate (HCC) T=cT1c, N=cN0, M=cM0 Intent: Curative     ==========DELIVERED PLANS==========  First Treatment Date: 2023-08-18 - Last Treatment Date: 2023-09-24   Plan Name: Prostate Site: Prostate Technique: IMRT Mode: Photon Dose Per Fraction: 2.5 Gy Prescribed Dose (Delivered / Prescribed): 70 Gy / 70 Gy Prescribed Fxs (Delivered / Prescribed): 28 / 28     ==========ON TREATMENT VISIT DATES========== 2023-08-22, 2023-08-29, 2023-09-08, 2023-09-11, 2023-09-18, 2023-09-24     ==========UPCOMING VISITS==========       ==========APPENDIX - ON TREATMENT VISIT NOTES==========   See weekly On Treatment Notes in Epic for details.

## 2023-10-08 DIAGNOSIS — R338 Other retention of urine: Secondary | ICD-10-CM | POA: Diagnosis not present

## 2023-10-27 NOTE — Progress Notes (Addendum)
  Radiation Oncology         [phone] ________________________________  Name: Refujio Haymer MRN: [id number]  Date of Service: 10/28/2023  DOB: July 14, 1961  Post Treatment Telephone Note  Diagnosis:  C61 Malignant neoplasm of prostate (as documented in provider EOT note)  Pre Treatment IPSS Score: 14 (as documented in the provider consult note)  The patient was available for call today.   Symptoms of fatigue have improved since completing therapy.  Symptoms of bladder changes have improved since completing therapy. Current symptoms include Hematuria, and medications for bladder symptoms include Tamsulosin .  Symptoms of bowel changes have improved since completing therapy. Current symptoms include none, and medications for bowel symptoms include none.   Post Treatment IPSS Score: N/A IPSS Questionnaire (AUA-7): N/A due to current urinary cath in place.  Patient currently has a urinary cath in place x 2.5 months and has noticed some moderate bleeding around the head of his penis w/o pain.  LILLETTE Rosaline HUNT-LPN have notified patient's urologist Dr. Gabe nurse 'Ms. Rosina at [facility]'. She states she Will directly reach out to patient for follow-up.  Patient has a scheduled follow up visit with his urologist, Dr. Fredrik Sherlyn Doss, on 11/09/2022 for ongoing surveillance. He was counseled that PSA levels will be drawn in the urology office, and was reassured that additional time is expected to improve bowel and bladder symptoms. He was encouraged to call back with concerns or questions regarding radiation.   This concludes the interaction.  Rosaline Minerva, LPN

## 2023-10-28 ENCOUNTER — Ambulatory Visit
Admission: RE | Admit: 2023-10-28 | Discharge: 2023-10-28 | Disposition: A | Discharge status: Home / Self Care | Payer: 59 | Source: Ambulatory Visit | Attending: Radiation Oncology | Admitting: Radiation Oncology

## 2024-01-15 ENCOUNTER — Other Ambulatory Visit: Payer: Self-pay | Admitting: Family Medicine

## 2024-01-15 DIAGNOSIS — K219 Gastro-esophageal reflux disease without esophagitis: Secondary | ICD-10-CM

## 2024-01-19 ENCOUNTER — Other Ambulatory Visit: Payer: Self-pay | Admitting: Family Medicine

## 2024-01-19 DIAGNOSIS — I152 Hypertension secondary to endocrine disorders: Secondary | ICD-10-CM

## 2024-01-19 LAB — HM DIABETES EYE EXAM

## 2024-01-28 ENCOUNTER — Emergency Department (HOSPITAL_COMMUNITY)
Admission: EM | Admit: 2024-01-28 | Discharge: 2024-01-29 | Disposition: A | Discharge status: Home / Self Care | Attending: Emergency Medicine | Admitting: Emergency Medicine

## 2024-01-28 ENCOUNTER — Other Ambulatory Visit: Payer: Self-pay

## 2024-01-28 ENCOUNTER — Encounter (HOSPITAL_COMMUNITY): Payer: Self-pay

## 2024-01-28 DIAGNOSIS — Z79899 Other long term (current) drug therapy: Secondary | ICD-10-CM | POA: Diagnosis not present

## 2024-01-28 DIAGNOSIS — Z7984 Long term (current) use of oral hypoglycemic drugs: Secondary | ICD-10-CM | POA: Insufficient documentation

## 2024-01-28 DIAGNOSIS — Z720 Tobacco use: Secondary | ICD-10-CM | POA: Diagnosis not present

## 2024-01-28 DIAGNOSIS — E119 Type 2 diabetes mellitus without complications: Secondary | ICD-10-CM | POA: Diagnosis not present

## 2024-01-28 DIAGNOSIS — Z8546 Personal history of malignant neoplasm of prostate: Secondary | ICD-10-CM | POA: Diagnosis not present

## 2024-01-28 DIAGNOSIS — R339 Retention of urine, unspecified: Secondary | ICD-10-CM | POA: Insufficient documentation

## 2024-01-28 DIAGNOSIS — I10 Essential (primary) hypertension: Secondary | ICD-10-CM | POA: Insufficient documentation

## 2024-01-28 LAB — URINALYSIS, ROUTINE W REFLEX MICROSCOPIC
Bilirubin Urine: NEGATIVE
Glucose, UA: NEGATIVE mg/dL
Ketones, ur: NEGATIVE mg/dL
Nitrite: NEGATIVE
Protein, ur: 30 mg/dL — AB
RBC / HPF: >50 RBC/hpf (ref 0–5)
Specific Gravity, Urine: 1.011 (ref 1.005–1.030)
WBC, UA: >50 WBC/hpf (ref 0–5)
pH: 5 (ref 5.0–8.0)

## 2024-01-28 MED ORDER — OXYCODONE-ACETAMINOPHEN 5-325 MG PO TABS
1.0000 | ORAL_TABLET | ORAL | Status: DC | PRN
  Administered 2024-01-28: 1 via ORAL
  Filled 2024-01-28: qty 1

## 2024-01-28 NOTE — ED Provider Triage Note (Signed)
 Emergency Medicine Provider Triage Evaluation Note  Henry Walker [facility] , a 63 y.o. male  was evaluated in triage.  Pt complains ofurinary retention. Had a catheter pulled this AM by urology. Unable to urinate since then. Severe pain   Review of Systems  Positive: Urinary rention Negative: fever  Physical Exam  BP (!) 162/87 (BP Location: Right Arm)   Pulse 98   Temp (!) 97.5 F (36.4 C) (Oral)   Resp 18   SpO2 98%  Gen:   Awake, no distress   Resp:  Normal effort  MSK:   Moves extremities without difficulty  Other:  488 ml on scan  Foley cath inserted.  Medical Decision Making  Medically screening exam initiated at 7:52 PM.  Appropriate orders placed.  [name] was informed that the remainder of the evaluation will be completed by another provider, this initial triage assessment does not replace that evaluation, and the importance of remaining in the ED until their evaluation is complete.     Arthor Captain, PA-C 01/28/24 1955

## 2024-01-28 NOTE — ED Triage Notes (Signed)
 Pt has hx of urinary retention, saw urology this morning and had his foley removed, states he has not urinated since 945 this morning. Bladder very distended and pt in a lot of pain

## 2024-01-29 MED ORDER — ACETAMINOPHEN 325 MG PO TABS
ORAL_TABLET | ORAL | Status: AC
  Filled 2024-01-29: qty 2

## 2024-01-29 NOTE — Discharge Instructions (Addendum)
 As discussed, continue the Foley catheter care at home.  Recommend follow-up with urology for reassessment.  Please do not hesitate to return if the worrisome signs and symptoms we discussed become apparent.

## 2024-01-29 NOTE — ED Provider Notes (Signed)
  EMERGENCY DEPARTMENT AT [facility] Provider Note   CSN: [id number] Arrival date & time: 01/28/24  1930     History  Chief Complaint  Patient presents with   Urinary Retention    Henry Walker is a 63 y.o. male.  HPI   63 year old male presents emergency department with complaints of urinary retention.  Follow-up with urology in the outpatient setting.  Have catheter removed earlier today with a trial of void.  Patient states that he is only been dribbling "a little bit" since leaving the urologist but feeling his bladder was swollen.  Foley catheter placed in triage area with complete relief of symptoms.  Placement has now been 5+ hours since being seen initially with no return of symptoms.  Past medical history significant for malignant neoplasm of prostate, emphysema of lung, GERD, hypertension, hyperlipidemia, diabetes mellitus type 2, BPH, gout  Home Medications Prior to Admission medications   Medication Sig Start Date End Date Taking? Authorizing Provider  albuterol (VENTOLIN HFA) 108 (90 Base) MCG/ACT inhaler Inhale 2 puffs into the lungs every 6 (six) hours as needed for wheezing or shortness of breath. Patient taking differently: Inhale 2 puffs into the lungs every 6 (six) hours as needed for wheezing or shortness of breath. 03/18/23   Hoy Register, MD  allopurinol (ZYLOPRIM) 100 MG tablet Take 1 tablet (100 mg total) by mouth daily. 09/22/23   Hoy Register, MD  amLODipine (NORVASC) 10 MG tablet Take 1 tablet (10 mg total) by mouth daily. 09/22/23   Hoy Register, MD  atorvastatin (LIPITOR) 80 MG tablet TAKE 1 TABLET BY MOUTH EVERY DAY 09/22/23   Hoy Register, MD  Blood Glucose Monitoring Suppl (ONE TOUCH ULTRA 2) w/Device KIT Use as directed 3 times daily before meals. 07/06/19   Hoy Register, MD  carvedilol (COREG) 12.5 MG tablet TAKE 1 TABLET(12.5 MG) BY MOUTH TWICE DAILY WITH A MEAL 01/20/24   Hoy Register, MD  cetirizine  (ZYRTEC ALLERGY) 10 MG tablet Take 1 tablet (10 mg total) by mouth daily. 01/06/23   Mayers, Cari S, PA-C  ciprofloxacin (CIPRO) 500 MG tablet Take 1 tablet (500 mg total) by mouth 2 (two) times daily. 09/18/23   Margaretmary Dys, MD  fluticasone (FLONASE) 50 MCG/ACT nasal spray Place 2 sprays into both nostrils daily. Patient taking differently: Place 2 sprays into both nostrils daily as needed for allergies. 01/06/23   Mayers, Cari S, PA-C  glucose blood (ONETOUCH ULTRA) test strip Use as instructed 3 times daily 07/06/19   Hoy Register, MD  Lancet Devices [facility]) lancets Use as instructed 08/04/15   Ambrose Finland, NP  Lancets [facility] ULTRASOFT) lancets Use as instructed 3 times daily 07/06/19   Hoy Register, MD  metFORMIN (GLUCOPHAGE-XR) 500 MG 24 hr tablet Take 1 tablet (500 mg total) by mouth daily with breakfast. TAKE 1 TABLET(500 MG) BY MOUTH DAILY WITH BREAKFAST 09/22/23   Hoy Register, MD  oxyCODONE (OXY IR/ROXICODONE) 5 MG immediate release tablet Take 1-2 tablets (5-10 mg total) by mouth every 6 (six) hours as needed for severe pain (pain score 7-10). 09/18/23   Margaretmary Dys, MD  pantoprazole (PROTONIX) 40 MG tablet TAKE 1 TABLET(40 MG) BY MOUTH DAILY 01/15/24   Hoy Register, MD  phenazopyridine (PYRIDIUM) 200 MG tablet Take 200 mg by mouth 3 (three) times daily as needed for pain.    [provider]  potassium chloride SA (KLOR-CON M) 20 MEQ tablet Take 2 tablets (40 mEq total) by mouth daily.  09/15/23   Prosperi, Christian H, PA-C  tamsulosin (FLOMAX) 0.4 MG CAPS capsule Take 1 capsule (0.4 mg total) by mouth daily after supper. 08/22/23   Margaretmary Dys, MD  traZODone (DESYREL) 50 MG tablet Take 1 tablet (50 mg total) by mouth at bedtime as needed for sleep. 09/22/23   Hoy Register, MD  valsartan-hydrochlorothiazide (DIOVAN-HCT) 320-25 MG tablet Take 1 tablet by mouth daily. 09/22/23   Hoy Register, MD  tadalafil (CIALIS) 20 MG tablet Take 0.5-1  tablets (10-20 mg total) by mouth every other day as needed for erectile dysfunction. Patient not taking: Reported on 09/19/2014 07/21/14 07/17/15  Doris Cheadle, MD      Allergies    Patient has no known allergies.    Review of Systems   Review of Systems  All other systems reviewed and are negative.   Physical Exam Updated Vital Signs BP 121/60   Pulse (!) 51   Temp 97.8 F (36.6 C) (Oral)   Resp 12   SpO2 97%  Physical Exam Vitals and nursing note reviewed.  Constitutional:      General: He is not in acute distress.    Appearance: He is well-developed.  HENT:     Head: Normocephalic and atraumatic.  Eyes:     Conjunctiva/sclera: Conjunctivae normal.  Cardiovascular:     Rate and Rhythm: Normal rate and regular rhythm.  Pulmonary:     Effort: Pulmonary effort is normal. No respiratory distress.     Breath sounds: Normal breath sounds.  Abdominal:     Palpations: Abdomen is soft.     Tenderness: There is no abdominal tenderness. There is no guarding.  Musculoskeletal:        General: No swelling.     Cervical back: Neck supple.  Skin:    General: Skin is warm and dry.     Capillary Refill: Capillary refill takes less than 2 seconds.  Neurological:     Mental Status: He is alert.  Psychiatric:        Mood and Affect: Mood normal.     ED Results / Procedures / Treatments   Labs (all labs ordered are listed, but only abnormal results are displayed) Labs Reviewed  URINALYSIS, ROUTINE W REFLEX MICROSCOPIC - Abnormal; Notable for the following components:      Result Value   APPearance HAZY (*)    Hgb urine dipstick LARGE (*)    Protein, ur 30 (*)    Leukocytes,Ua LARGE (*)    Bacteria, UA RARE (*)    All other components within normal limits    EKG None  Radiology No results found.  Procedures Procedures    Medications Ordered in ED Medications  oxyCODONE-acetaminophen (PERCOCET/ROXICET) 5-325 MG per tablet 1 tablet (1 tablet Oral Given 01/28/24  2003)    ED Course/ Medical Decision Making/ A&P                                 Medical Decision Making Risk Prescription drug management.   This patient presents to the ED for concern of urinary retention, this involves an extensive number of treatment options, and is a complaint that carries with it a high risk of complications and morbidity.  The differential diagnosis includes BPH, prostate cancer, medication side effect, cauda equina/other spinal cord compression/impingement, other   Co morbidities that complicate the patient evaluation  See HPI   Additional history obtained:  Additional history obtained from EMR  External records from outside source obtained and reviewed including hospital records   Lab Tests:  I Ordered, and personally interpreted labs.  The pertinent results include: UA with rare bacteria, greater than 50 RBCs/WBCs and large leukocytes.  Negative nitrite.   Imaging Studies ordered:  N/a   Cardiac Monitoring: / EKG:  The patient was maintained on a cardiac monitor.  I personally viewed and interpreted the cardiac monitored which showed an underlying rhythm of: sinus rhythm   Consultations Obtained:  N/a   Problem List / ED Course / Critical interventions / Medication management  Urinary retention I ordered medication including percocet   Reevaluation of the patient after these medicines showed that the patient improved I have reviewed the patients home medicines and have made adjustments as needed   Social Determinants of Health:  Chronic tobacco use.  Denies illicit drug use.   Test / Admission - Considered:  Urinary retention Vitals signs  within normal range and stable throughout visit. Laboratory studies significant for: See above 63 year old male presents emergency department with complaints of urinary retention.  Follow-up with urology in the outpatient setting.  Had catheter removed earlier today with a trial of void.   Patient states that he is only been dribbling "a little bit" since leaving the urologist but feeling his bladder was swollen.  Foley catheter placed in triage area with complete relief of symptoms.  Placement has now been 5+ hours since being seen initially with no return of symptoms. On exam, no appreciable tenderness of abdomen.  Patient symptoms resolved ever since Foley catheter was placed in triage area several hours ago.  Will recommend Foley catheter care at home and follow-up with urology in the outpatient setting.  Treatment plan discussed with patient and he acknowledged understanding was agreeable to said plan.  Patient will well-appearing, afebrile in no acute distress. Worrisome signs and symptoms were discussed with the patient, and the patient acknowledged understanding to return to the ED if noticed. Patient was stable upon discharge.          Final Clinical Impression(s) / ED Diagnoses Final diagnoses:  Urinary retention    Rx / DC Orders ED Discharge Orders     None         Peter Garter, [place] 01/29/24 0024    Gilda Crease, MD 01/29/24 [phone]

## 2024-02-17 ENCOUNTER — Other Ambulatory Visit: Payer: Self-pay | Admitting: Family Medicine

## 2024-02-17 DIAGNOSIS — G4709 Other insomnia: Secondary | ICD-10-CM

## 2024-03-23 ENCOUNTER — Ambulatory Visit: Payer: 59 | Admitting: Family Medicine

## 2024-04-06 ENCOUNTER — Other Ambulatory Visit: Payer: Self-pay | Admitting: Urology

## 2024-04-06 DIAGNOSIS — R338 Other retention of urine: Secondary | ICD-10-CM

## 2024-04-13 ENCOUNTER — Ambulatory Visit: Payer: Self-pay | Attending: Family Medicine

## 2024-04-13 VITALS — BP 121/60 | Ht 67.0 in | Wt 170.0 lb

## 2024-04-13 DIAGNOSIS — Z0000 Encounter for general adult medical examination without abnormal findings: Secondary | ICD-10-CM | POA: Diagnosis not present

## 2024-04-13 NOTE — Progress Notes (Signed)
 Because this visit was a virtual/telehealth visit,  certain criteria was not obtained, such a blood pressure, CBG if applicable, and timed get up and go. Any medications not marked as taking were not mentioned during the medication reconciliation part of the visit. Any vitals not documented were not able to be obtained due to this being a telehealth visit or patient was unable to self-report a recent blood pressure reading due to a lack of equipment at home via telehealth. Vitals that have been documented are verbally provided by the patient.   This visit was performed by a medical professional under my direct supervision. I was immediately available for consultation/collaboration. I have reviewed and agree with the Annual Wellness Visit documentation.  Subjective:   Dean Goldner is a 63 y.o. who presents for a Medicare Wellness preventive visit.  As a reminder, Annual Wellness Visits don't include a physical exam, and some assessments may be limited, especially if this visit is performed virtually. We may recommend an in-person follow-up visit with your provider if needed.  Visit Complete: Virtual I connected with  [name] on 04/13/24 by a audio enabled telemedicine application and verified that I am speaking with the correct person using two identifiers.  Patient Location: Home  Provider Location: Home Office  I discussed the limitations of evaluation and management by telemedicine. The patient expressed understanding and agreed to proceed.  Vital Signs: Because this visit was a virtual/telehealth visit, some criteria may be missing or patient reported. Any vitals not documented were not able to be obtained and vitals that have been documented are patient reported.  VideoDeclined- This patient declined Librarian, academic. Therefore the visit was completed with audio only.  Persons Participating in Visit: Patient.  AWV Questionnaire: No:  Patient Medicare AWV questionnaire was not completed prior to this visit.  Cardiac Risk Factors include: advanced age (>69men, >52 women);hypertension;male gender;diabetes mellitus;dyslipidemia     Objective:    Today's Vitals   04/13/24 1200  BP: 121/60  Weight: 170 lb (77.1 kg)  Height: 5' 7 (1.702 m)   Body mass index is 26.63 kg/m.     04/13/2024   12:06 PM 09/15/2023    9:11 AM 07/25/2023    6:57 AM 07/15/2023   12:35 PM 04/16/2023    1:16 PM 06/18/2022    2:51 PM 06/19/2021    1:20 PM  Advanced Directives  Does Patient Have a Medical Advance Directive? No No No No No No No  Would patient like information on creating a medical advance directive? No - Patient declined  No - Patient declined Yes (ED - Information included in AVS) Yes (MAU/Ambulatory/Procedural Areas - Information given) Yes (ED - Information included in AVS)     Current Medications (verified) Outpatient Encounter Medications as of 04/13/2024  Medication Sig   albuterol  (VENTOLIN  HFA) 108 (90 Base) MCG/ACT inhaler Inhale 2 puffs into the lungs every 6 (six) hours as needed for wheezing or shortness of breath. (Patient taking differently: Inhale 2 puffs into the lungs every 6 (six) hours as needed for wheezing or shortness of breath.)   allopurinol  (ZYLOPRIM ) 100 MG tablet Take 1 tablet (100 mg total) by mouth daily.   amLODipine  (NORVASC ) 10 MG tablet Take 1 tablet (10 mg total) by mouth daily.   atorvastatin  (LIPITOR) 80 MG tablet TAKE 1 TABLET BY MOUTH EVERY DAY   Blood Glucose Monitoring Suppl (ONE TOUCH ULTRA 2) w/Device KIT Use as directed 3 times daily before meals.  carvedilol  (COREG ) 12.5 MG tablet TAKE 1 TABLET(12.5 MG) BY MOUTH TWICE DAILY WITH A MEAL   cetirizine  (ZYRTEC  ALLERGY) 10 MG tablet Take 1 tablet (10 mg total) by mouth daily.   ciprofloxacin  (CIPRO ) 500 MG tablet Take 1 tablet (500 mg total) by mouth 2 (two) times daily.   fluticasone  (FLONASE ) 50 MCG/ACT nasal spray Place 2 sprays into both  nostrils daily. (Patient taking differently: Place 2 sprays into both nostrils daily as needed for allergies.)   glucose blood (ONETOUCH ULTRA) test strip Use as instructed 3 times daily   Lancet Devices (ACCU-CHEK SOFTCLIX) lancets Use as instructed   Lancets (ONETOUCH ULTRASOFT) lancets Use as instructed 3 times daily   metFORMIN  (GLUCOPHAGE -XR) 500 MG 24 hr tablet Take 1 tablet (500 mg total) by mouth daily with breakfast. TAKE 1 TABLET(500 MG) BY MOUTH DAILY WITH BREAKFAST   oxyCODONE  (OXY IR/ROXICODONE ) 5 MG immediate release tablet Take 1-2 tablets (5-10 mg total) by mouth every 6 (six) hours as needed for severe pain (pain score 7-10).   pantoprazole  (PROTONIX ) 40 MG tablet TAKE 1 TABLET(40 MG) BY MOUTH DAILY   phenazopyridine (PYRIDIUM) 200 MG tablet Take 200 mg by mouth 3 (three) times daily as needed for pain.   potassium chloride  SA (KLOR-CON  M) 20 MEQ tablet Take 2 tablets (40 mEq total) by mouth daily.   tamsulosin  (FLOMAX ) 0.4 MG CAPS capsule Take 1 capsule (0.4 mg total) by mouth daily after supper.   traZODone  (DESYREL ) 50 MG tablet TAKE 1 TABLET(50 MG) BY MOUTH AT BEDTIME AS NEEDED FOR SLEEP   valsartan -hydrochlorothiazide  (DIOVAN -HCT) 320-25 MG tablet Take 1 tablet by mouth daily.   [DISCONTINUED] tadalafil  (CIALIS ) 20 MG tablet Take 0.5-1 tablets (10-20 mg total) by mouth every other day as needed for erectile dysfunction. (Patient not taking: Reported on 09/19/2014)   No facility-administered encounter medications on file as of 04/13/2024.    Allergies (verified) Patient has no known allergies.   History: Past Medical History:  Diagnosis Date   Benign localized prostatic hyperplasia with lower urinary tract symptoms (LUTS)    Chronic gout    ED (erectile dysfunction)    Emphysema of lung (HCC)    Full dentures    GERD (gastroesophageal reflux disease)    Hx of adenomatous colonic polyps 10/06/2014   Hyperlipidemia    Hypertension    Malignant neoplasm prostate (HCC)  05/2023   urologist--- dr pace/  radiation onologist--- dr Lorri Rota;  dx 08/ 2024, gleason 3 +4   Type 2 diabetes mellitus (HCC)    followed by pcp;    (07-17-2023  per pt checks blood sugar every other day)   Past Surgical History:  Procedure Laterality Date   COLONOSCOPY WITH PROPOFOL   05/19/2019   dr Willy Harvest   GOLD SEED IMPLANT N/A 07/25/2023   Procedure: GOLD SEED IMPLANT;  Surgeon: Lahoma Pigg, MD;  Location: [facility];  Service: Urology;  Laterality: N/A;   POSTERIOR FUSION LUMBAR SPINE  09/03/2006   @[facility]  by dr Melven Stable. Faylene Hoots;  laminectomy/  disectomy/ fusion L5--S1   SPACE OAR INSTILLATION N/A 07/25/2023   Procedure: SPACE OAR INSTILLATION;  Surgeon: Lahoma Pigg, MD;  Location: [facility];  Service: Urology;  Laterality: N/A;   Family History  Problem Relation Age of Onset   Hypertension Mother    Cirrhosis Father    Hypertension Sister    Stroke Brother    Colon cancer Neg Hx    Esophageal cancer Neg Hx  Rectal cancer Neg Hx    Stomach cancer Neg Hx    Colon polyps Neg Hx    Social History   Socioeconomic History   Marital status: Single    Spouse name: Not on file   Number of children: Not on file   Years of education: Not on file   Highest education level: Not on file  Occupational History   Not on file  Tobacco Use   Smoking status: Every Day    Current packs/day: 0.50    Types: Cigarettes   Smokeless tobacco: Never   Tobacco comments:    07-17-2023  per pt smoked 1/2 ppd,  smoked since age 89  Vaping Use   Vaping status: Never Used  Substance and Sexual Activity   Alcohol use: No    Alcohol/week: 0.0 standard drinks of alcohol   Drug use: Never   Sexual activity: Not on file  Other Topics Concern   Not on file  Social History Narrative   Retired Customer service manager (gas, Mining engineer, cable)   Social Drivers of Corporate investment banker Strain: Medium Risk (04/13/2024)   Overall Financial Resource Strain (CARDIA)     Difficulty of Paying Living Expenses: Somewhat hard  Food Insecurity: No Food Insecurity (04/13/2024)   Hunger Vital Sign    Worried About Running Out of Food in the Last Year: Never true    Ran Out of Food in the Last Year: Never true  Transportation Needs: No Transportation Needs (04/13/2024)   PRAPARE - Administrator, Civil Service (Medical): No    Lack of Transportation (Non-Medical): No  Physical Activity: Sufficiently Active (04/13/2024)   Exercise Vital Sign    Days of Exercise per Week: 6 days    Minutes of Exercise per Session: 30 min  Stress: No Stress Concern Present (04/13/2024)   [employer] of Occupational Health - Occupational Stress Questionnaire    Feeling of Stress: Not at all  Social Connections: Moderately Integrated (04/13/2024)   Social Connection and Isolation Panel    Frequency of Communication with Friends and Family: More than three times a week    Frequency of Social Gatherings with Friends and Family: More than three times a week    Attends Religious Services: More than 4 times per year    Active Member of [employer] or Organizations: Yes    Attends Banker Meetings: Never    Marital Status: Divorced    Tobacco Counseling Ready to quit: Not Answered Counseling given: Not Answered Tobacco comments: 07-17-2023  per pt smoked 1/2 ppd,  smoked since age 3    Clinical Intake:  Pre-visit preparation completed: Yes  Pain : No/denies pain     BMI - recorded: 26.63 Nutritional Status: BMI 25 -29 Overweight Nutritional Risks: Other (Comment) Diabetes: Yes CBG done?: No Did pt. bring in CBG monitor from home?: No  Lab Results  Component Value Date   HGBA1C 6.9 09/22/2023   HGBA1C 6.7 03/18/2023   HGBA1C 6.7 09/17/2022     How often do you need to have someone help you when you read instructions, pamphlets, or other written materials from your doctor or pharmacy?: 1 - Never  Interpreter Needed?: No  Information entered  by :: Juliann Ochoa   Activities of Daily Living     04/13/2024   12:05 PM 07/25/2023    7:04 AM  In your present state of health, do you have any difficulty performing the following activities:  Hearing? 0 0  Vision? 0 0  Difficulty concentrating or making decisions? 0 0  Walking or climbing stairs? 0   Dressing or bathing? 0   Doing errands, shopping? 0   Preparing Food and eating ? N   Using the Toilet? N   In the past six months, have you accidently leaked urine? N   Do you have problems with loss of bowel control? N   Managing your Medications? N   Managing your Finances? N   Housekeeping or managing your Housekeeping? N     Patient Care Team: Joaquin Mulberry, MD as PCP - General (Family Medicine) Velma Ghazi, DPM as Consulting Physician (Podiatry) [facility], [facility] Katheleen Palmer, RN as Oncology Nurse Navigator  I have updated your Care Teams any recent Medical Services you may have received from other providers in the past year.     Assessment:   This is a routine wellness examination for [place].  Hearing/Vision screen Hearing Screening - Comments:: Patient declined hearing issues  Vision Screening - Comments:: Patient wears glasses    Goals Addressed             This Visit's Progress    Patient Stated       To get over prostate cancer       Depression Screen     04/13/2024   12:07 PM 09/22/2023    3:04 PM 07/15/2023   12:51 PM 05/12/2023    9:31 AM 04/16/2023    1:16 PM 03/18/2023   10:29 AM 09/17/2022    9:31 AM  PHQ 2/9 Scores  PHQ - 2 Score 2 0 0 0 0 0 0  PHQ- 9 Score 3 8  1 3 3  0    Fall Risk     04/13/2024   12:06 PM 09/22/2023    3:03 PM 05/12/2023    9:31 AM 04/16/2023    1:17 PM 03/18/2023   10:29 AM  Fall Risk   Falls in the past year? 0 0 0 0 0  Number falls in past yr: 0 0 0 0 0  Injury with Fall? 0 0 0 0 0  Risk for fall due to : No Fall Risks No Fall Risks No Fall Risks No  Fall Risks No Fall Risks  Follow up Falls evaluation completed Falls evaluation completed Falls evaluation completed Falls prevention discussed;Education provided;Falls evaluation completed     MEDICARE RISK AT HOME:  Medicare Risk at Home Any stairs in or around the home?: Yes If so, are there any without handrails?: No Home free of loose throw rugs in walkways, pet beds, electrical cords, etc?: Yes Adequate lighting in your home to reduce risk of falls?: Yes Life alert?: No Use of a cane, walker or w/c?: No Grab bars in the bathroom?: No Shower chair or bench in shower?: No Elevated toilet seat or a handicapped toilet?: No  TIMED UP AND GO:  Was the test performed?  No  Cognitive Function: 6CIT completed    06/18/2022    2:53 PM  MMSE - Mini Mental State Exam  Orientation to time 5  Orientation to Place 5  Registration 3  Attention/ Calculation 5  Recall 3  Language- name 2 objects 2  Language- repeat 1  Language- follow 3 step command 3  Language- read & follow direction 1  Write a sentence 1  Copy design 1  Total score 30        04/13/2024   12:04 PM 04/16/2023  1:17 PM 06/18/2022    2:53 PM  6CIT Screen  What Year? 0 points 0 points 0 points  What month? 0 points 0 points 0 points  What time? 0 points 0 points 0 points  Count back from 20 0 points 0 points 0 points  Months in reverse 0 points 0 points 0 points  Repeat phrase 0 points 0 points 0 points  Total Score 0 points 0 points 0 points    Immunizations Immunization History  Administered Date(s) Administered   Influenza, Seasonal, Injecte, Preservative Fre 09/22/2023   Influenza,inj,Quad PF,6+ Mos 10/12/2013, 06/03/2016, 06/19/2018, 07/07/2019, 09/06/2020, 09/12/2021   PNEUMOCOCCAL CONJUGATE-20 09/12/2021   PPD Test 07/20/2018   Pneumococcal Polysaccharide-23 05/29/2015   Tdap 06/03/2016    Screening Tests Health Maintenance  Topic Date Due   COVID-19 Vaccine (1) Never done   Zoster  Vaccines- Shingrix (1 of 2) Never done   Diabetic kidney evaluation - Urine ACR  09/18/2023   Lung Cancer Screening  10/24/2023   FOOT EXAM  03/17/2024   HEMOGLOBIN A1C  03/21/2024   Colonoscopy  05/18/2024   INFLUENZA VACCINE  05/28/2024   Diabetic kidney evaluation - eGFR measurement  09/14/2024   OPHTHALMOLOGY EXAM  01/18/2025   Medicare Annual Wellness (AWV)  04/13/2025   DTaP/Tdap/Td (2 - Td or Tdap) 06/03/2026   Pneumococcal Vaccine 55-82 Years old  Completed   Hepatitis C Screening  Completed   HIV Screening  Completed   HPV VACCINES  Aged Out   Meningococcal B Vaccine  Aged Out    Health Maintenance  Health Maintenance Due  Topic Date Due   COVID-19 Vaccine (1) Never done   Zoster Vaccines- Shingrix (1 of 2) Never done   Diabetic kidney evaluation - Urine ACR  09/18/2023   Lung Cancer Screening  10/24/2023   FOOT EXAM  03/17/2024   HEMOGLOBIN A1C  03/21/2024   Colonoscopy  05/18/2024   Health Maintenance Items Addressed:   Additional Screening:  Vision Screening: Recommended annual ophthalmology exams for early detection of glaucoma and other disorders of the eye. Would you like a referral to an eye doctor? No    Dental Screening: Recommended annual dental exams for proper oral hygiene  Community Resource Referral / Chronic Care Management: CRR required this visit?  No   CCM required this visit?  No   Plan:    I have personally reviewed and noted the following in the patient's chart:   Medical and social history Use of alcohol, tobacco or illicit drugs  Current medications and supplements including opioid prescriptions. Patient is not currently taking opioid prescriptions. Functional ability and status Nutritional status Physical activity Advanced directives List of other physicians Hospitalizations, surgeries, and ER visits in previous 12 months Vitals Screenings to include cognitive, depression, and falls Referrals and appointments  In  addition, I have reviewed and discussed with patient certain preventive protocols, quality metrics, and best practice recommendations. A written personalized care plan for preventive services as well as general preventive health recommendations were provided to patient.   Freeda Jerry, [place]   04/13/2024   After Visit Summary: (MyChart) Due to this being a telephonic visit, the after visit summary with patients personalized plan was offered to patient via MyChart   Notes: Nothing significant to report at this time.

## 2024-04-15 ENCOUNTER — Other Ambulatory Visit: Payer: Self-pay | Admitting: Family Medicine

## 2024-04-15 DIAGNOSIS — E1169 Type 2 diabetes mellitus with other specified complication: Secondary | ICD-10-CM

## 2024-04-15 DIAGNOSIS — E1159 Type 2 diabetes mellitus with other circulatory complications: Secondary | ICD-10-CM

## 2024-04-15 DIAGNOSIS — M1A0710 Idiopathic chronic gout, right ankle and foot, without tophus (tophi): Secondary | ICD-10-CM

## 2024-04-17 ENCOUNTER — Other Ambulatory Visit: Payer: Self-pay | Admitting: Family Medicine

## 2024-04-17 DIAGNOSIS — E1159 Type 2 diabetes mellitus with other circulatory complications: Secondary | ICD-10-CM

## 2024-04-19 ENCOUNTER — Other Ambulatory Visit: Payer: Self-pay | Admitting: Family Medicine

## 2024-04-19 DIAGNOSIS — E1159 Type 2 diabetes mellitus with other circulatory complications: Secondary | ICD-10-CM

## 2024-04-20 NOTE — Progress Notes (Signed)
 Chief Complaint: Patient was seen in consultation today for benign prostatic hyperplasia with urinary retention.  Referring Physician(s): Pace,Maryellen D  History of Present Illness: Henry Walker is a 63 y.o. male with a medical history significant for DM2, HTN, emphysema, gout, early stage prostate cancer (1 core positive 07/25/23, s/p EBRT 11/24)) and benign prostatic hyperplasia with lower urinary tract symptoms. He developed severe urinary retention during his radiation therapy and has been unable to void since then. He is unable to tolerate intermittent catheterization and has required foley catheterization.   His urology team discussed TURP versus prostate artery embolization however TURP is not the preferred treatment due to his history of radiation therapy. The patient has been referred to Interventional Radiology to discuss prostate artery embolization as a possible treatment option.   Past Medical History:  Diagnosis Date   Benign localized prostatic hyperplasia with lower urinary tract symptoms (LUTS)    Chronic gout    ED (erectile dysfunction)    Emphysema of lung (HCC)    Full dentures    GERD (gastroesophageal reflux disease)    Hx of adenomatous colonic polyps 10/06/2014   Hyperlipidemia    Hypertension    Malignant neoplasm prostate (HCC) 05/2023   urologist--- dr pace/  radiation onologist--- dr patrcia;  dx 08/ 2024, gleason 3 +4   Type 2 diabetes mellitus (HCC)    followed by pcp;    (07-17-2023  per pt checks blood sugar every other day)    Past Surgical History:  Procedure Laterality Date   COLONOSCOPY WITH PROPOFOL   05/19/2019   dr avram   GOLD SEED IMPLANT N/A 07/25/2023   Procedure: GOLD SEED IMPLANT;  Surgeon: Selma Donnice SAUNDERS, MD;  Location: [facility];  Service: Urology;  Laterality: N/A;   POSTERIOR FUSION LUMBAR SPINE  09/03/2006   @[facility]  by dr christella. gust;  laminectomy/  disectomy/ fusion L5--S1   SPACE OAR INSTILLATION N/A  07/25/2023   Procedure: SPACE OAR INSTILLATION;  Surgeon: Selma Donnice SAUNDERS, MD;  Location: [facility];  Service: Urology;  Laterality: N/A;    Allergies: Patient has no known allergies.  Medications: Prior to Admission medications   Medication Sig Start Date End Date Taking? Authorizing Provider  albuterol  (VENTOLIN  HFA) 108 (90 Base) MCG/ACT inhaler Inhale 2 puffs into the lungs every 6 (six) hours as needed for wheezing or shortness of breath. Patient taking differently: Inhale 2 puffs into the lungs every 6 (six) hours as needed for wheezing or shortness of breath. 03/18/23   Delbert Clam, MD  allopurinol  (ZYLOPRIM ) 100 MG tablet TAKE 1 TABLET(100 MG) BY MOUTH DAILY 04/15/24   Newlin, Enobong, MD  amLODipine  (NORVASC ) 10 MG tablet TAKE 1 TABLET(10 MG) BY MOUTH DAILY 04/19/24   Newlin, Enobong, MD  atorvastatin  (LIPITOR) 80 MG tablet TAKE 1 TABLET BY MOUTH EVERY DAY 04/15/24   Newlin, Enobong, MD  Blood Glucose Monitoring Suppl (ONE TOUCH ULTRA 2) w/Device KIT Use as directed 3 times daily before meals. 07/06/19   Newlin, Enobong, MD  carvedilol  (COREG ) 12.5 MG tablet TAKE 1 TABLET(12.5 MG) BY MOUTH TWICE DAILY WITH A MEAL 01/20/24   Newlin, Enobong, MD  cetirizine  (ZYRTEC  ALLERGY) 10 MG tablet Take 1 tablet (10 mg total) by mouth daily. 01/06/23   Mayers, Cari S, PA-C  ciprofloxacin  (CIPRO ) 500 MG tablet Take 1 tablet (500 mg total) by mouth 2 (two) times daily. 09/18/23   patrcia Donnice, MD  fluticasone  (FLONASE ) 50 MCG/ACT nasal spray Place 2 sprays  into both nostrils daily. Patient taking differently: Place 2 sprays into both nostrils daily as needed for allergies. 01/06/23   Mayers, Cari S, PA-C  glucose blood (ONETOUCH ULTRA) test strip Use as instructed 3 times daily 07/06/19   Newlin, Enobong, MD  Lancet Devices [facility]) lancets Use as instructed 08/04/15   Oleta Berwyn LABOR, NP  Lancets [facility] ULTRASOFT) lancets Use as instructed 3 times daily 07/06/19   Newlin,  Enobong, MD  metFORMIN  (GLUCOPHAGE -XR) 500 MG 24 hr tablet Take 1 tablet (500 mg total) by mouth daily with breakfast. TAKE 1 TABLET(500 MG) BY MOUTH DAILY WITH BREAKFAST 09/22/23   Newlin, Enobong, MD  oxyCODONE  (OXY IR/ROXICODONE ) 5 MG immediate release tablet Take 1-2 tablets (5-10 mg total) by mouth every 6 (six) hours as needed for severe pain (pain score 7-10). 09/18/23   Patrcia Cough, MD  pantoprazole  (PROTONIX ) 40 MG tablet TAKE 1 TABLET(40 MG) BY MOUTH DAILY 01/15/24   Newlin, Enobong, MD  phenazopyridine (PYRIDIUM) 200 MG tablet Take 200 mg by mouth 3 (three) times daily as needed for pain.    [provider]  potassium chloride  SA (KLOR-CON  M) 20 MEQ tablet Take 2 tablets (40 mEq total) by mouth daily. 09/15/23   Prosperi, Christian H, PA-C  tamsulosin  (FLOMAX ) 0.4 MG CAPS capsule Take 1 capsule (0.4 mg total) by mouth daily after supper. 08/22/23   Patrcia Cough, MD  traZODone  (DESYREL ) 50 MG tablet TAKE 1 TABLET(50 MG) BY MOUTH AT BEDTIME AS NEEDED FOR SLEEP 02/17/24   Newlin, Enobong, MD  valsartan -hydrochlorothiazide  (DIOVAN -HCT) 320-25 MG tablet TAKE 1 TABLET BY MOUTH DAILY 04/19/24   Newlin, Enobong, MD  tadalafil  (CIALIS ) 20 MG tablet Take 0.5-1 tablets (10-20 mg total) by mouth every other day as needed for erectile dysfunction. Patient not taking: Reported on 09/19/2014 07/21/14 07/17/15  Colton Drape, MD     Family History  Problem Relation Age of Onset   Hypertension Mother    Cirrhosis Father    Hypertension Sister    Stroke Brother    Colon cancer Neg Hx    Esophageal cancer Neg Hx    Rectal cancer Neg Hx    Stomach cancer Neg Hx    Colon polyps Neg Hx     Social History   Socioeconomic History   Marital status: Single    Spouse name: Not on file   Number of children: Not on file   Years of education: Not on file   Highest education level: Not on file  Occupational History   Not on file  Tobacco Use   Smoking status: Every Day    Current  packs/day: 0.50    Types: Cigarettes   Smokeless tobacco: Never   Tobacco comments:    07-17-2023  per pt smoked 1/2 ppd,  smoked since age 76  Vaping Use   Vaping status: Never Used  Substance and Sexual Activity   Alcohol use: No    Alcohol/week: 0.0 standard drinks of alcohol   Drug use: Never   Sexual activity: Not on file  Other Topics Concern   Not on file  Social History Narrative   Retired Customer service manager (gas, Mining engineer, cable)   Social Drivers of Health   Financial Resource Strain: Medium Risk (04/13/2024)   Overall Financial Resource Strain (CARDIA)    Difficulty of Paying Living Expenses: Somewhat hard  Food Insecurity: No Food Insecurity (04/13/2024)   Hunger Vital Sign    Worried About Running Out of Food in the Last Year: Never  true    Ran Out of Food in the Last Year: Never true  Transportation Needs: No Transportation Needs (04/13/2024)   PRAPARE - Administrator, Civil Service (Medical): No    Lack of Transportation (Non-Medical): No  Physical Activity: Sufficiently Active (04/13/2024)   Exercise Vital Sign    Days of Exercise per Week: 6 days    Minutes of Exercise per Session: 30 min  Stress: No Stress Concern Present (04/13/2024)   [employer] of Occupational Health - Occupational Stress Questionnaire    Feeling of Stress: Not at all  Social Connections: Moderately Integrated (04/13/2024)   Social Connection and Isolation Panel    Frequency of Communication with Friends and Family: More than three times a week    Frequency of Social Gatherings with Friends and Family: More than three times a week    Attends Religious Services: More than 4 times per year    Active Member of [employer] or Organizations: Yes    Attends Banker Meetings: Never    Marital Status: Divorced    Review of Systems: A 12 point ROS discussed and pertinent positives are indicated in the HPI above.  All other systems are negative.  Review of  Systems  Vital Signs: There were no vitals taken for this visit.  Physical Exam  Imaging: No results found.  Labs:  CBC: Recent Labs    07/25/23 0722 09/15/23 0920  WBC  --  7.2  HGB 14.3 14.0  HCT 42.0 40.7  PLT  --  195    COAGS: No results for input(s): INR, APTT in the last 8760 hours.  BMP: Recent Labs    07/25/23 0722 09/15/23 0920  NA 141 139  K 3.7 2.8*  CL 101 104  CO2  --  20*  GLUCOSE 133* 156*  BUN 15 17  CALCIUM   --  9.4  CREATININE 1.30* 1.29*  GFRNONAA  --  >60    LIVER FUNCTION TESTS: Recent Labs    09/15/23 0920  BILITOT 0.9  AST 25  ALT 31  ALKPHOS 59  PROT 8.1  ALBUMIN 4.2    TUMOR MARKERS: No results for input(s): AFPTM, CEA, CA199, CHROMGRNA in the last 8760 hours.  Assessment and Plan:  63 year old male with a history of prostate cancer s/p radiation therapy and benign prostatic hyperplasia with urinary retention requiring foley catheterization.   Thank you for this interesting consult.  I greatly enjoyed meeting [facility] and look forward to participating in their care.  A copy of this report was sent to the requesting provider on this date.  Ester Sides, MD Pager: [phone]    I spent a total of  40 Minutes   in face to face in clinical consultation, greater than 50% of which was counseling/coordinating care for benign prostatic hyperplasia.

## 2024-04-21 ENCOUNTER — Other Ambulatory Visit (HOSPITAL_COMMUNITY): Payer: Self-pay | Admitting: Interventional Radiology

## 2024-04-21 ENCOUNTER — Other Ambulatory Visit: Payer: Self-pay | Admitting: Interventional Radiology

## 2024-04-21 ENCOUNTER — Telehealth (HOSPITAL_COMMUNITY): Payer: Self-pay | Admitting: Interventional Radiology

## 2024-04-21 ENCOUNTER — Ambulatory Visit
Admission: RE | Admit: 2024-04-21 | Discharge: 2024-04-21 | Disposition: A | Discharge status: Home / Self Care | Source: Ambulatory Visit | Attending: Urology | Admitting: Urology

## 2024-04-21 DIAGNOSIS — N401 Enlarged prostate with lower urinary tract symptoms: Secondary | ICD-10-CM

## 2024-04-21 DIAGNOSIS — R338 Other retention of urine: Secondary | ICD-10-CM

## 2024-04-21 HISTORY — PX: IR RADIOLOGIST EVAL & MGMT: IMG5224

## 2024-04-21 NOTE — Telephone Encounter (Signed)
 Called pt to see if he was interested in scheduling his PAE with Dr. Jennefer at [facility] on 04/22/24 as this was requested to be scheduled ASAP. The patient said he did not have time to do it tomorrow. I let know him it may take some time to get him back in with Dr. Jennefer and he said that would be ok. Henry Walker

## 2024-04-22 ENCOUNTER — Ambulatory Visit
Admission: RE | Admit: 2024-04-22 | Discharge: 2024-04-22 | Disposition: A | Discharge status: Home / Self Care | Source: Ambulatory Visit | Attending: Interventional Radiology | Admitting: Interventional Radiology

## 2024-04-22 DIAGNOSIS — N401 Enlarged prostate with lower urinary tract symptoms: Secondary | ICD-10-CM

## 2024-04-22 MED ORDER — IOPAMIDOL (ISOVUE-370) INJECTION 76%
100.0000 mL | Freq: Once | INTRAVENOUS | Status: AC | PRN
  Administered 2024-04-22: 100 mL via INTRAVENOUS

## 2024-04-23 ENCOUNTER — Other Ambulatory Visit (HOSPITAL_COMMUNITY): Payer: Self-pay | Admitting: Interventional Radiology

## 2024-04-23 ENCOUNTER — Other Ambulatory Visit: Payer: Self-pay | Admitting: Interventional Radiology

## 2024-04-23 ENCOUNTER — Ambulatory Visit
Admission: RE | Admit: 2024-04-23 | Discharge: 2024-04-23 | Disposition: A | Discharge status: Home / Self Care | Source: Ambulatory Visit | Attending: Interventional Radiology | Admitting: Interventional Radiology

## 2024-04-23 DIAGNOSIS — N138 Other obstructive and reflux uropathy: Secondary | ICD-10-CM

## 2024-04-23 DIAGNOSIS — I7090 Unspecified atherosclerosis: Secondary | ICD-10-CM

## 2024-04-23 HISTORY — PX: IR RADIOLOGIST EVAL & MGMT: IMG5224

## 2024-04-23 NOTE — Progress Notes (Signed)
 This encounter was conducted via the [employer] providing interactive audio and visual communication.  The patient provided verbal consent to conduct a virtual appointment.  The patient was located at their primary residence during this encounter.  Referring Physician(s): Pace,Maryellen D   Chief Complaint: The patient is seen in virtual telephone visit today for benign prostatic hyperplasia with urinary retention.  History of present illness: HPI from initial consultation 04/21/24 Henry Walker is a 63 y.o. male with a medical history significant for DM2, HTN, emphysema, gout, early stage prostate cancer (1 core positive 07/25/23, s/p EBRT 11/24) and benign prostatic hyperplasia with lower urinary tract symptoms. He developed severe urinary retention during his radiation therapy and has been unable to void since then. He is unable to tolerate intermittent catheterization and has required foley catheterization.    His urology team discussed TURP versus prostate artery embolization however TURP is not the preferred treatment due to his history of radiation therapy. The patient has been referred to Interventional Radiology to discuss prostate artery embolization as a possible treatment option.   The patient expressed his frustration and depression regarding his dependence on the foley catheter. He has failed multiple voiding trials. We discussed the rationale, periprocedural expectations, risks, and long term expected outcomes of prostate artery embolization. He was agreeable and wished to proceed. A CTA Pelvis was ordered for further work up and he presents today via virtual video visit to discuss these results.   He endorses pain and cramping in his buttocks and thighs with ambulation, which resolves with rest.  Past Medical History:  Diagnosis Date   Benign localized prostatic hyperplasia with lower urinary tract symptoms (LUTS)    Chronic gout    ED (erectile dysfunction)     Emphysema of lung (HCC)    Full dentures    GERD (gastroesophageal reflux disease)    Hx of adenomatous colonic polyps 10/06/2014   Hyperlipidemia    Hypertension    Malignant neoplasm prostate (HCC) 05/2023   urologist--- dr pace/  radiation onologist--- dr patrcia;  dx 08/ 2024, gleason 3 +4   Type 2 diabetes mellitus (HCC)    followed by pcp;    (07-17-2023  per pt checks blood sugar every other day)    Past Surgical History:  Procedure Laterality Date   COLONOSCOPY WITH PROPOFOL   05/19/2019   dr avram   GOLD SEED IMPLANT N/A 07/25/2023   Procedure: GOLD SEED IMPLANT;  Surgeon: Selma Donnice SAUNDERS, MD;  Location: [facility];  Service: Urology;  Laterality: N/A;   IR RADIOLOGIST EVAL & MGMT  04/21/2024   POSTERIOR FUSION LUMBAR SPINE  09/03/2006   @[facility]  by dr christella. gust;  laminectomy/  disectomy/ fusion L5--S1   SPACE OAR INSTILLATION N/A 07/25/2023   Procedure: SPACE OAR INSTILLATION;  Surgeon: Selma Donnice SAUNDERS, MD;  Location[facility];  Service: Urology;  Laterality: N/A;    Allergies: Patient has no known allergies.  Medications: Prior to Admission medications   Medication Sig Start Date End Date Taking? Authorizing Provider  albuterol  (VENTOLIN  HFA) 108 (90 Base) MCG/ACT inhaler Inhale 2 puffs into the lungs every 6 (six) hours as needed for wheezing or shortness of breath. Patient taking differently: Inhale 2 puffs into the lungs every 6 (six) hours as needed for wheezing or shortness of breath. 03/18/23   Delbert Clam, MD  allopurinol  (ZYLOPRIM ) 100 MG tablet TAKE 1 TABLET(100 MG) BY MOUTH DAILY 04/15/24   Newlin, Enobong, MD  amLODipine  (  NORVASC ) 10 MG tablet TAKE 1 TABLET(10 MG) BY MOUTH DAILY 04/19/24   Newlin, Enobong, MD  atorvastatin  (LIPITOR) 80 MG tablet TAKE 1 TABLET BY MOUTH EVERY DAY 04/15/24   Newlin, Enobong, MD  Blood Glucose Monitoring Suppl (ONE TOUCH ULTRA 2) w/Device KIT Use as directed 3 times daily before meals. 07/06/19    Newlin, Enobong, MD  carvedilol  (COREG ) 12.5 MG tablet TAKE 1 TABLET(12.5 MG) BY MOUTH TWICE DAILY WITH A MEAL 01/20/24   Newlin, Enobong, MD  cetirizine  (ZYRTEC  ALLERGY) 10 MG tablet Take 1 tablet (10 mg total) by mouth daily. 01/06/23   Mayers, Cari S, PA-C  ciprofloxacin  (CIPRO ) 500 MG tablet Take 1 tablet (500 mg total) by mouth 2 (two) times daily. 09/18/23   Patrcia Cough, MD  fluticasone  (FLONASE ) 50 MCG/ACT nasal spray Place 2 sprays into both nostrils daily. Patient taking differently: Place 2 sprays into both nostrils daily as needed for allergies. 01/06/23   Mayers, Cari S, PA-C  glucose blood (ONETOUCH ULTRA) test strip Use as instructed 3 times daily 07/06/19   Newlin, Enobong, MD  Lancet Devices [facility]) lancets Use as instructed 08/04/15   Oleta Berwyn LABOR, NP  Lancets [facility] ULTRASOFT) lancets Use as instructed 3 times daily 07/06/19   Newlin, Enobong, MD  metFORMIN  (GLUCOPHAGE -XR) 500 MG 24 hr tablet Take 1 tablet (500 mg total) by mouth daily with breakfast. TAKE 1 TABLET(500 MG) BY MOUTH DAILY WITH BREAKFAST 09/22/23   Newlin, Enobong, MD  oxyCODONE  (OXY IR/ROXICODONE ) 5 MG immediate release tablet Take 1-2 tablets (5-10 mg total) by mouth every 6 (six) hours as needed for severe pain (pain score 7-10). 09/18/23   Patrcia Cough, MD  pantoprazole  (PROTONIX ) 40 MG tablet TAKE 1 TABLET(40 MG) BY MOUTH DAILY 01/15/24   Newlin, Enobong, MD  phenazopyridine (PYRIDIUM) 200 MG tablet Take 200 mg by mouth 3 (three) times daily as needed for pain.    [provider]  potassium chloride  SA (KLOR-CON  M) 20 MEQ tablet Take 2 tablets (40 mEq total) by mouth daily. 09/15/23   Prosperi, Christian H, PA-C  tamsulosin  (FLOMAX ) 0.4 MG CAPS capsule Take 1 capsule (0.4 mg total) by mouth daily after supper. 08/22/23   Patrcia Cough, MD  traZODone  (DESYREL ) 50 MG tablet TAKE 1 TABLET(50 MG) BY MOUTH AT BEDTIME AS NEEDED FOR SLEEP 02/17/24   Newlin, Enobong, MD   valsartan -hydrochlorothiazide  (DIOVAN -HCT) 320-25 MG tablet TAKE 1 TABLET BY MOUTH DAILY 04/19/24   Newlin, Enobong, MD  tadalafil  (CIALIS ) 20 MG tablet Take 0.5-1 tablets (10-20 mg total) by mouth every other day as needed for erectile dysfunction. Patient not taking: Reported on 09/19/2014 07/21/14 07/17/15  Colton Drape, MD     Family History  Problem Relation Age of Onset   Hypertension Mother    Cirrhosis Father    Hypertension Sister    Stroke Brother    Colon cancer Neg Hx    Esophageal cancer Neg Hx    Rectal cancer Neg Hx    Stomach cancer Neg Hx    Colon polyps Neg Hx     Social History   Socioeconomic History   Marital status: Single    Spouse name: Not on file   Number of children: Not on file   Years of education: Not on file   Highest education level: Not on file  Occupational History   Not on file  Tobacco Use   Smoking status: Every Day    Current packs/day: 0.50    Types: Cigarettes  Smokeless tobacco: Never   Tobacco comments:    07-17-2023  per pt smoked 1/2 ppd,  smoked since age 63  Vaping Use   Vaping status: Never Used  Substance and Sexual Activity   Alcohol use: No    Alcohol/week: 0.0 standard drinks of alcohol   Drug use: Never   Sexual activity: Not on file  Other Topics Concern   Not on file  Social History Narrative   Retired Customer service manager (gas, Mining engineer, cable)   Social Drivers of Corporate investment banker Strain: Medium Risk (04/13/2024)   Overall Financial Resource Strain (CARDIA)    Difficulty of Paying Living Expenses: Somewhat hard  Food Insecurity: No Food Insecurity (04/13/2024)   Hunger Vital Sign    Worried About Running Out of Food in the Last Year: Never true    Ran Out of Food in the Last Year: Never true  Transportation Needs: No Transportation Needs (04/13/2024)   PRAPARE - Administrator, Civil Service (Medical): No    Lack of Transportation (Non-Medical): No  Physical Activity: Sufficiently  Active (04/13/2024)   Exercise Vital Sign    Days of Exercise per Week: 6 days    Minutes of Exercise per Session: 30 min  Stress: No Stress Concern Present (04/13/2024)   [employer] of Occupational Health - Occupational Stress Questionnaire    Feeling of Stress: Not at all  Social Connections: Moderately Integrated (04/13/2024)   Social Connection and Isolation Panel    Frequency of Communication with Friends and Family: More than three times a week    Frequency of Social Gatherings with Friends and Family: More than three times a week    Attends Religious Services: More than 4 times per year    Active Member of [employer] or Organizations: Yes    Attends Banker Meetings: Never    Marital Status: Divorced     Vital Signs: There were no vitals taken for this visit.  Physical Exam No physical examination was performed in lieu of virtual telephone clinic visit.    Imaging: US  prostate 06/20/23    CTA Pelvis IMPRESSION: VASCULAR  1. Severe aortoiliac peripheral arterial disease with extensive and irregular/ulcerated fibrofatty atherosclerotic plaque resulting in significant narrowing of the distal aorta, the left common iliac artery and the mid aspect of the right external iliac artery. 2. Both internal iliac arteries remain patent although there is some stenosis of the origin of the anterior division on the left. 3. Right prostatic artery appears to arise proximally from the anterior division. 4. On the left, there appear to be 2 prostatic arteries, 1 arising from a common trunk with the superior vesicular artery and the second arising just distal to this trunk from the anterior division. This second branch may only supply the seminal vesicles. 5. Increased arterial enhancement along the left wall of the rectum with a prominent middle rectal artery. Suspect possible cross collateral flow between the prostatic arteries and middle rectal artery in this  region. Attention at the time of angiography if attempted.  NON-VASCULAR  1. Diffuse chronic bladder wall thickening with Foley catheter in place. 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 2. Additional ancillary findings as above.    Labs:  CBC: Recent Labs    07/25/23 0722 09/15/23 0920  WBC  --  7.2  HGB 14.3 14.0  HCT 42.0 40.7  PLT  --  195    COAGS: No results for input(s): INR, APTT in the last  8760 hours.  BMP: Recent Labs    07/25/23 0722 09/15/23 0920  NA 141 139  K 3.7 2.8*  CL 101 104  CO2  --  20*  GLUCOSE 133* 156*  BUN 15 17  CALCIUM   --  9.4  CREATININE 1.30* 1.29*  GFRNONAA  --  >60    LIVER FUNCTION TESTS: Recent Labs    09/15/23 0920  BILITOT 0.9  AST 25  ALT 31  ALKPHOS 59  PROT 8.1  ALBUMIN 4.2    Assessment and Plan: 63 year old male with a history of prostatic hyperplasia and prostate cancer status post external beam radiation therapy (November 2024) 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 He has failed multiple voiding trials and medical management.  He is very frustrated and depressed with his current situation of having to have the Foley in place.  CTA pelvis was performed 04/21/24 and revealed severe aortoiliac peripheral arterial disease.  He endorses symptoms of inflow claudication.  Plan to obtain CTA abdomen for further characterization.  Once CT complete we will discuss results and best next steps.  Ester Sides, MD Pager: [phone]   I spent a total of 25 Minutes in virtual video clinical consultation, greater than 50% of which was counseling/coordinating care for benign prostatic hyperplasia.

## 2024-04-26 ENCOUNTER — Telehealth: Payer: Self-pay | Admitting: Family Medicine

## 2024-04-26 NOTE — Telephone Encounter (Signed)
 Contacted pt to confimed appt!

## 2024-04-27 ENCOUNTER — Ambulatory Visit: Attending: Family Medicine | Admitting: Family Medicine

## 2024-04-27 ENCOUNTER — Encounter: Payer: Self-pay | Admitting: Family Medicine

## 2024-04-27 VITALS — BP 115/69 | HR 47 | Ht 67.0 in | Wt 165.6 lb

## 2024-04-27 DIAGNOSIS — F17210 Nicotine dependence, cigarettes, uncomplicated: Secondary | ICD-10-CM

## 2024-04-27 DIAGNOSIS — I10 Essential (primary) hypertension: Secondary | ICD-10-CM | POA: Diagnosis not present

## 2024-04-27 DIAGNOSIS — F0631 Mood disorder due to known physiological condition with depressive features: Secondary | ICD-10-CM

## 2024-04-27 DIAGNOSIS — M1A0710 Idiopathic chronic gout, right ankle and foot, without tophus (tophi): Secondary | ICD-10-CM

## 2024-04-27 DIAGNOSIS — Z7984 Long term (current) use of oral hypoglycemic drugs: Secondary | ICD-10-CM

## 2024-04-27 DIAGNOSIS — E785 Hyperlipidemia, unspecified: Secondary | ICD-10-CM

## 2024-04-27 DIAGNOSIS — C61 Malignant neoplasm of prostate: Secondary | ICD-10-CM

## 2024-04-27 DIAGNOSIS — I739 Peripheral vascular disease, unspecified: Secondary | ICD-10-CM | POA: Diagnosis not present

## 2024-04-27 DIAGNOSIS — E1169 Type 2 diabetes mellitus with other specified complication: Secondary | ICD-10-CM

## 2024-04-27 DIAGNOSIS — K219 Gastro-esophageal reflux disease without esophagitis: Secondary | ICD-10-CM

## 2024-04-27 DIAGNOSIS — I152 Hypertension secondary to endocrine disorders: Secondary | ICD-10-CM

## 2024-04-27 DIAGNOSIS — G4709 Other insomnia: Secondary | ICD-10-CM

## 2024-04-27 DIAGNOSIS — R001 Bradycardia, unspecified: Secondary | ICD-10-CM

## 2024-04-27 LAB — POCT GLYCOSYLATED HEMOGLOBIN (HGB A1C): HbA1c, POC (controlled diabetic range): 6.4 % (ref 0.0–7.0)

## 2024-04-27 MED ORDER — BUPROPION HCL ER (XL) 150 MG PO TB24: 150.0000 mg | ORAL_TABLET | Freq: Every day | ORAL | 1 refills | Status: DC

## 2024-04-27 MED ORDER — TRAZODONE HCL 50 MG PO TABS: 50.0000 mg | ORAL_TABLET | Freq: Every day | ORAL | 1 refills | Status: DC

## 2024-04-27 MED ORDER — ALLOPURINOL 100 MG PO TABS: 100.0000 mg | ORAL_TABLET | Freq: Every day | ORAL | 1 refills | Status: DC

## 2024-04-27 MED ORDER — ATORVASTATIN CALCIUM 80 MG PO TABS: 80.0000 mg | ORAL_TABLET | Freq: Every day | ORAL | 1 refills | Status: DC

## 2024-04-27 MED ORDER — CARVEDILOL 12.5 MG PO TABS: 12.5000 mg | ORAL_TABLET | Freq: Two times a day (BID) | ORAL | 1 refills | Status: DC

## 2024-04-27 MED ORDER — METFORMIN HCL ER 500 MG PO TB24: 500.0000 mg | ORAL_TABLET | Freq: Every day | ORAL | 1 refills | Status: DC

## 2024-04-27 MED ORDER — PANTOPRAZOLE SODIUM 40 MG PO TBEC: 40.0000 mg | DELAYED_RELEASE_TABLET | Freq: Every day | ORAL | 1 refills | Status: DC

## 2024-04-27 MED ORDER — AMLODIPINE BESYLATE 10 MG PO TABS: 10.0000 mg | ORAL_TABLET | Freq: Every day | ORAL | 1 refills | Status: DC

## 2024-04-27 MED ORDER — VALSARTAN-HYDROCHLOROTHIAZIDE 320-25 MG PO TABS: 1.0000 | ORAL_TABLET | Freq: Every day | ORAL | 1 refills | Status: DC

## 2024-04-27 NOTE — Patient Instructions (Signed)
 VISIT SUMMARY:  Today, we discussed your ongoing health issues, including prostate cancer, urinary retention, peripheral arterial disease, depression, bradycardia, and a persistent fungal infection on your foot. We reviewed your recent CT scan results and your current medications. We also talked about your smoking history and its impact on your health.  YOUR PLAN:  -PROSTATE CANCER: Prostate cancer is a type of cancer that occurs in the prostate gland. Your prostate swelling is causing urinary retention, and you are currently using a catheter. We will check your PSA level and continue follow-ups with your urologist. You are also awaiting a procedure to reduce the prostate size, which has been complicated by arterial plaque.  -PERIPHERAL ARTERIAL DISEASE (PAD): PAD is a condition where the arteries are narrowed, reducing blood flow to your limbs. This is likely worsened by smoking and is causing hip and upper thigh pain after prolonged walking. We will refer you to a vascular surgeon and strongly advise you to quit smoking.  -DEPRESSION: Depression is a mood disorder that affects how you feel and can cause a loss of interest in activities. Your depression is linked to your prostate cancer treatment and urinary retention. We have prescribed Wellbutrin  to help manage your symptoms and will monitor your mood and appetite changes.  -BRADYCARDIA: Bradycardia is a slower than normal heart rate. Your heart rate is currently at 50 beats per minute without any symptoms. We will recheck your heart rate and monitor for any symptoms.  -ONYCHOMYCOSIS: Onychomycosis is a fungal infection of the toenails. Your infection has not responded to over-the-counter treatments. We will consider alternative treatments to address this issue.  -GENERAL HEALTH MAINTENANCE: Your diabetes and hypertension are well-controlled, and your A1c is at 6.4%. We encourage you to quit smoking and continue your current medications. We will  also order a lung cancer screening CT scan due to your long-term smoking history.  INSTRUCTIONS:  Please schedule follow-up appointments with your urologist and vascular surgeon. We will monitor your PSA levels and reassess your mood and appetite at your next visit. Continue managing your catheter as needed and follow the advice provided for smoking cessation.

## 2024-04-27 NOTE — Progress Notes (Signed)
 Subjective:  Patient ID: Henry Walker, male    DOB: 10-14-1961  Age: 63 y.o. MRN: [id number]  CC: Medical Management of Chronic Issues (Depression/)     Discussed the use of AI scribe software for clinical note transcription with the patient, who gave verbal consent to proceed.  History of Present Illness Henry Walker is a 63 year old male with a history of type 2 diabetes mellitus (A1c 6.9), hypertension, hyperlipidemia, GERD, gout, emphysema and tobacco abuse (0.5 ppd since he was 14)  prostate cancer who presents with urinary retention and complications post-radiation therapy.  He experiences significant urinary retention following radiation therapy for prostate cancer, requiring catheterization due to persistent outlet obstruction since November.  He experiences hip and upper thigh pain after prolonged walking, attributed to a previous procedure.  He is unhappy that his urologist had called him regarding difficulty with an anticipated prostate procedure given his CT findings.  CT angiogram of the pelvis revealed severe aortoiliac peripheral arterial disease with extensive atherosclerotic plaque resulting in significant narrowing of the distal aorta, left common iliac artery and mid aspect of the right external iliac artery.   Bladder spasms, described as 'blowouts,' are partially managed with medication. He experiences depression related to his health issues and lifestyle impact, including clothing limitations due to the catheter.  A fungal infection on his foot persists despite over-the-counter treatments, causing discomfort with certain shoes.  He has been to see podiatry for this.  His  heart rate of 50 without symptoms like lightheadedness or fainting. Blood pressure and diabetes are well-controlled, with an A1c of 6.4. He takes trazodone  for sleep.    Past Medical History:  Diagnosis Date   Benign localized prostatic hyperplasia with lower urinary tract  symptoms (LUTS)    Chronic gout    ED (erectile dysfunction)    Emphysema of lung (HCC)    Full dentures    GERD (gastroesophageal reflux disease)    Hx of adenomatous colonic polyps 10/06/2014   Hyperlipidemia    Hypertension    Malignant neoplasm prostate (HCC) 05/2023   urologist--- dr pace/  radiation onologist--- dr patrcia;  dx 08/ 2024, gleason 3 +4   Type 2 diabetes mellitus (HCC)    followed by pcp;    (07-17-2023  per pt checks blood sugar every other day)    Past Surgical History:  Procedure Laterality Date   COLONOSCOPY WITH PROPOFOL   05/19/2019   dr avram   GOLD SEED IMPLANT N/A 07/25/2023   Procedure: GOLD SEED IMPLANT;  Surgeon: Selma Donnice SAUNDERS, MD;  Location: [facility];  Service: Urology;  Laterality: N/A;   IR RADIOLOGIST EVAL & MGMT  04/21/2024   IR RADIOLOGIST EVAL & MGMT  04/23/2024   POSTERIOR FUSION LUMBAR SPINE  09/03/2006   @[facility]  by dr christella. gust;  laminectomy/  disectomy/ fusion L5--S1   SPACE OAR INSTILLATION N/A 07/25/2023   Procedure: SPACE OAR INSTILLATION;  Surgeon: Selma Donnice SAUNDERS, MD;  Location: [facility];  Service: Urology;  Laterality: N/A;    Family History  Problem Relation Age of Onset   Hypertension Mother    Cirrhosis Father    Hypertension Sister    Stroke Brother    Colon cancer Neg Hx    Esophageal cancer Neg Hx    Rectal cancer Neg Hx    Stomach cancer Neg Hx    Colon polyps Neg Hx     Social History   Socioeconomic History   Marital  status: Single    Spouse name: Not on file   Number of children: Not on file   Years of education: Not on file   Highest education level: Not on file  Occupational History   Not on file  Tobacco Use   Smoking status: Every Day    Current packs/day: 0.50    Types: Cigarettes   Smokeless tobacco: Never   Tobacco comments:    07-17-2023  per pt smoked 1/2 ppd,  smoked since age 1  Vaping Use   Vaping status: Never Used  Substance and Sexual Activity    Alcohol use: No    Alcohol/week: 0.0 standard drinks of alcohol   Drug use: Never   Sexual activity: Not on file  Other Topics Concern   Not on file  Social History Narrative   Retired Customer service manager (gas, Mining engineer, cable)   Social Drivers of Corporate investment banker Strain: Medium Risk (04/13/2024)   Overall Financial Resource Strain (CARDIA)    Difficulty of Paying Living Expenses: Somewhat hard  Food Insecurity: No Food Insecurity (04/13/2024)   Hunger Vital Sign    Worried About Running Out of Food in the Last Year: Never true    Ran Out of Food in the Last Year: Never true  Transportation Needs: No Transportation Needs (04/13/2024)   PRAPARE - Administrator, Civil Service (Medical): No    Lack of Transportation (Non-Medical): No  Physical Activity: Sufficiently Active (04/13/2024)   Exercise Vital Sign    Days of Exercise per Week: 6 days    Minutes of Exercise per Session: 30 min  Stress: No Stress Concern Present (04/13/2024)   [employer] of Occupational Health - Occupational Stress Questionnaire    Feeling of Stress: Not at all  Social Connections: Moderately Integrated (04/13/2024)   Social Connection and Isolation Panel    Frequency of Communication with Friends and Family: More than three times a week    Frequency of Social Gatherings with Friends and Family: More than three times a week    Attends Religious Services: More than 4 times per year    Active Member of [employer] or Organizations: Yes    Attends Banker Meetings: Never    Marital Status: Divorced    No Known Allergies  Outpatient Medications Prior to Visit  Medication Sig Dispense Refill   albuterol  (VENTOLIN  HFA) 108 (90 Base) MCG/ACT inhaler Inhale 2 puffs into the lungs every 6 (six) hours as needed for wheezing or shortness of breath. 8 g 2   tamsulosin  (FLOMAX ) 0.4 MG CAPS capsule Take 1 capsule (0.4 mg total) by mouth daily after supper. 30 capsule 5   amLODipine   (NORVASC ) 10 MG tablet TAKE 1 TABLET(10 MG) BY MOUTH DAILY 30 tablet 0   atorvastatin  (LIPITOR) 80 MG tablet TAKE 1 TABLET BY MOUTH EVERY DAY 30 tablet 0   carvedilol  (COREG ) 12.5 MG tablet TAKE 1 TABLET(12.5 MG) BY MOUTH TWICE DAILY WITH A MEAL 180 tablet 1   metFORMIN  (GLUCOPHAGE -XR) 500 MG 24 hr tablet Take 1 tablet (500 mg total) by mouth daily with breakfast. TAKE 1 TABLET(500 MG) BY MOUTH DAILY WITH BREAKFAST 90 tablet 1   traZODone  (DESYREL ) 50 MG tablet TAKE 1 TABLET(50 MG) BY MOUTH AT BEDTIME AS NEEDED FOR SLEEP 90 tablet 0   valsartan -hydrochlorothiazide  (DIOVAN -HCT) 320-25 MG tablet TAKE 1 TABLET BY MOUTH DAILY 30 tablet 0   Blood Glucose Monitoring Suppl (ONE TOUCH ULTRA 2) w/Device KIT Use as directed 3  times daily before meals. 1 kit 0   cetirizine  (ZYRTEC  ALLERGY) 10 MG tablet Take 1 tablet (10 mg total) by mouth daily. 30 tablet 11   ciprofloxacin  (CIPRO ) 500 MG tablet Take 1 tablet (500 mg total) by mouth 2 (two) times daily. 20 tablet 0   fluticasone  (FLONASE ) 50 MCG/ACT nasal spray Place 2 sprays into both nostrils daily. (Patient taking differently: Place 2 sprays into both nostrils daily as needed for allergies.) 16 g 6   glucose blood (ONETOUCH ULTRA) test strip Use as instructed 3 times daily 100 each 12   Lancet Devices (ACCU-CHEK SOFTCLIX) lancets Use as instructed 1 each 0   Lancets (ONETOUCH ULTRASOFT) lancets Use as instructed 3 times daily 100 each 12   oxyCODONE  (OXY IR/ROXICODONE ) 5 MG immediate release tablet Take 1-2 tablets (5-10 mg total) by mouth every 6 (six) hours as needed for severe pain (pain score 7-10). 45 tablet 0   phenazopyridine (PYRIDIUM) 200 MG tablet Take 200 mg by mouth 3 (three) times daily as needed for pain.     potassium chloride  SA (KLOR-CON  M) 20 MEQ tablet Take 2 tablets (40 mEq total) by mouth daily. 14 tablet 0   allopurinol  (ZYLOPRIM ) 100 MG tablet TAKE 1 TABLET(100 MG) BY MOUTH DAILY 30 tablet 0   pantoprazole  (PROTONIX ) 40 MG tablet TAKE  1 TABLET(40 MG) BY MOUTH DAILY 90 tablet 1   No facility-administered medications prior to visit.     ROS Review of Systems  Constitutional:  Negative for activity change and appetite change.  HENT:  Negative for sinus pressure and sore throat.   Respiratory:  Negative for chest tightness, shortness of breath and wheezing.   Cardiovascular:  Negative for chest pain and palpitations.  Gastrointestinal:  Negative for abdominal distention, abdominal pain and constipation.  Genitourinary:  Positive for difficulty urinating.  Musculoskeletal: Negative.   Psychiatric/Behavioral:  Positive for dysphoric mood. Negative for behavioral problems.     Objective:  BP 115/69   Pulse (!) 47   Ht 5' 7 (1.702 m)   Wt 165 lb 9.6 oz (75.1 kg)   SpO2 98%   BMI 25.94 kg/m      04/27/2024    2:16 PM 04/21/2024   10:34 AM 04/13/2024   12:00 PM  BP/Weight  Systolic BP 115 122 121  Diastolic BP 69 78 60  Wt. (Lbs) 165.6  170  BMI 25.94 kg/m2  26.63 kg/m2      Physical Exam Constitutional:      Appearance: He is well-developed.   Cardiovascular:     Rate and Rhythm: Bradycardia present.     Heart sounds: Normal heart sounds. No murmur heard. Pulmonary:     Effort: Pulmonary effort is normal.     Breath sounds: Normal breath sounds. No wheezing or rales.  Chest:     Chest wall: No tenderness.  Abdominal:     General: Bowel sounds are normal. There is no distension.     Palpations: Abdomen is soft. There is no mass.     Tenderness: There is no abdominal tenderness.   Musculoskeletal:        General: Normal range of motion.     Right lower leg: No edema.     Left lower leg: No edema.   Neurological:     Mental Status: He is alert and oriented to person, place, and time.   Psychiatric:     Comments: Dysphoric mood        Latest Ref Rng &  Units 09/15/2023    9:20 AM 07/25/2023    7:22 AM 04/04/2023    1:56 PM  CMP  Glucose 70 - 99 mg/dL 843  866    BUN 8 - 23 mg/dL 17  15     Creatinine 9.38 - 1.24 mg/dL 8.70  8.69    Sodium 864 - 145 mmol/L 139  141    Potassium 3.5 - 5.1 mmol/L 2.8  3.7    Chloride 98 - 111 mmol/L 104  101    CO2 22 - 32 mmol/L 20     Calcium  8.9 - 10.3 mg/dL 9.4     Total Protein 6.5 - 8.1 g/dL 8.1   6.9   Total Bilirubin <1.2 mg/dL 0.9   0.6   Alkaline Phos 38 - 126 U/L 59   72   AST 15 - 41 U/L 25   21   ALT 0 - 44 U/L 31   27     Lipid Panel     Component Value Date/Time   CHOL 132 02/07/2022 1425   TRIG 89 02/07/2022 1425   HDL 29 (L) 02/07/2022 1425   CHOLHDL 4.6 02/07/2022 1425   CHOLHDL 5.9 (H) 01/05/2016 1155   VLDL 30 01/05/2016 1155   LDLCALC 86 02/07/2022 1425    CBC    Component Value Date/Time   WBC 7.2 09/15/2023 0920   RBC 4.94 09/15/2023 0920   HGB 14.0 09/15/2023 0920   HGB 15.1 02/07/2022 1425   HCT 40.7 09/15/2023 0920   HCT 44.9 02/07/2022 1425   PLT 195 09/15/2023 0920   PLT 315 02/07/2022 1425   MCV 82.4 09/15/2023 0920   MCV 81 02/07/2022 1425   MCH 28.3 09/15/2023 0920   MCHC 34.4 09/15/2023 0920   RDW 14.1 09/15/2023 0920   RDW 13.7 02/07/2022 1425   LYMPHSABS 1.4 09/15/2023 0920   LYMPHSABS 3.8 (H) 02/07/2022 1425   MONOABS 0.8 09/15/2023 0920   EOSABS 0.1 09/15/2023 0920   EOSABS 0.2 02/07/2022 1425   BASOSABS 0.0 09/15/2023 0920   BASOSABS 0.1 02/07/2022 1425    Lab Results  Component Value Date   HGBA1C 6.4 04/27/2024    Lab Results  Component Value Date   HGBA1C 6.4 04/27/2024   HGBA1C 6.9 09/22/2023   HGBA1C 6.7 03/18/2023      1. Type 2 diabetes mellitus with other specified complication, without long-term current use of insulin (HCC) (Primary) Controlled with A1c of 6.4 Continue current regimen - POCT glycosylated hemoglobin (Hb A1C) - metFORMIN  (GLUCOPHAGE -XR) 500 MG 24 hr tablet; Take 1 tablet (500 mg total) by mouth daily with breakfast.  Dispense: 90 tablet; Refill: 1  2. PAD (peripheral artery disease) (HCC) Severe aortoiliac disease incidental finding on CTA  of the pelvis Explained that the greatest risk factor which is modifiable with his ongoing tobacco use Continue statin - Ambulatory referral to Vascular Surgery  3. Chronic idiopathic gout involving toe of right foot without tophus Stable - allopurinol  (ZYLOPRIM ) 100 MG tablet; Take 1 tablet (100 mg total) by mouth daily.  Dispense: 90 tablet; Refill: 1  4. Hypertension associated with diabetes (HCC) Controlled - CMP14+EGFR - amLODipine  (NORVASC ) 10 MG tablet; Take 1 tablet (10 mg total) by mouth daily.  Dispense: 90 tablet; Refill: 1 - carvedilol  (COREG ) 12.5 MG tablet; Take 1 tablet (12.5 mg total) by mouth 2 (two) times daily with a meal.  Dispense: 180 tablet; Refill: 1 - valsartan -hydrochlorothiazide  (DIOVAN -HCT) 320-25 MG tablet; Take 1 tablet by mouth daily.  Dispense: 90 tablet; Refill: 1  5. Hyperlipidemia associated with type 2 diabetes mellitus (HCC) Controlled Low-cholesterol diet - atorvastatin  (LIPITOR) 80 MG tablet; Take 1 tablet (80 mg total) by mouth daily.  Dispense: 90 tablet; Refill: 1  6. Gastroesophageal reflux disease without esophagitis Controlled - pantoprazole  (PROTONIX ) 40 MG tablet; Take 1 tablet (40 mg total) by mouth daily.  Dispense: 90 tablet; Refill: 1  7. Other insomnia Stable - traZODone  (DESYREL ) 50 MG tablet; Take 1 tablet (50 mg total) by mouth at bedtime.  Dispense: 90 tablet; Refill: 1  8. Malignant neoplasm of prostate [facility]) Status post radiation now with bladder outlet obstruction Anticipated urologic procedure limited by severe aortoiliac disease He will be meeting with his urologist to discuss next steps - PSA, total and free  9. Depression due to physical illness Declines initiation of SSRI Wellbutrin  initiated which will help with smoking cessation and advised that this is also a mild antidepressant  10. Smoking greater than 20 pack years Spent 3 minutes counseling on smoking cessation and he is willing to work on cessation -  buPROPion  (WELLBUTRIN  XL) 150 MG 24 hr tablet; Take 1 tablet (150 mg total) by mouth daily. For smoking cessation  Dispense: 90 tablet; Refill: 1  11. Bradycardia Heart rate of 50 but he has had some degree of bradycardia in the past Currently on a beta-blocker Asymptomatic Will continue to monitor and if he is symptomatic we will need to decrease carvedilol  dose and add on another antihypertensive   Meds ordered this encounter  Medications   allopurinol  (ZYLOPRIM ) 100 MG tablet    Sig: Take 1 tablet (100 mg total) by mouth daily.    Dispense:  90 tablet    Refill:  1   amLODipine  (NORVASC ) 10 MG tablet    Sig: Take 1 tablet (10 mg total) by mouth daily.    Dispense:  90 tablet    Refill:  1   atorvastatin  (LIPITOR) 80 MG tablet    Sig: Take 1 tablet (80 mg total) by mouth daily.    Dispense:  90 tablet    Refill:  1   carvedilol  (COREG ) 12.5 MG tablet    Sig: Take 1 tablet (12.5 mg total) by mouth 2 (two) times daily with a meal.    Dispense:  180 tablet    Refill:  1   metFORMIN  (GLUCOPHAGE -XR) 500 MG 24 hr tablet    Sig: Take 1 tablet (500 mg total) by mouth daily with breakfast.    Dispense:  90 tablet    Refill:  1   pantoprazole  (PROTONIX ) 40 MG tablet    Sig: Take 1 tablet (40 mg total) by mouth daily.    Dispense:  90 tablet    Refill:  1   traZODone  (DESYREL ) 50 MG tablet    Sig: Take 1 tablet (50 mg total) by mouth at bedtime.    Dispense:  90 tablet    Refill:  1   valsartan -hydrochlorothiazide  (DIOVAN -HCT) 320-25 MG tablet    Sig: Take 1 tablet by mouth daily.    Dispense:  90 tablet    Refill:  1   buPROPion  (WELLBUTRIN  XL) 150 MG 24 hr tablet    Sig: Take 1 tablet (150 mg total) by mouth daily. For smoking cessation    Dispense:  90 tablet    Refill:  1    Follow-up: Return in about 6 months (around 10/28/2024) for Chronic medical conditions.     Visit required 46 minutes of  patient care including median intraservice time, reviewing previous notes and  test results, coordination of care, counseling the patient in addition to management of chronic medical conditions.Time also spent ordering medications, investigations and documenting in the chart.  All questions were answered to the patient's satisfaction   Corrina Sabin, MD, FAAFP. [facility] [place], [place] [phone]   04/27/2024, 5:21 PM

## 2024-04-28 ENCOUNTER — Ambulatory Visit
Admission: RE | Admit: 2024-04-28 | Discharge: 2024-04-28 | Disposition: A | Discharge status: Home / Self Care | Source: Ambulatory Visit | Attending: Interventional Radiology | Admitting: Interventional Radiology

## 2024-04-28 ENCOUNTER — Ambulatory Visit: Payer: Self-pay | Admitting: Family Medicine

## 2024-04-28 DIAGNOSIS — I7090 Unspecified atherosclerosis: Secondary | ICD-10-CM

## 2024-04-28 LAB — CMP14+EGFR
ALT: 33 IU/L (ref 0–44)
AST: 22 IU/L (ref 0–40)
Albumin: 4.4 g/dL (ref 3.9–4.9)
Alkaline Phosphatase: 70 IU/L (ref 44–121)
BUN/Creatinine Ratio: 9 — ABNORMAL LOW (ref 10–24)
BUN: 9 mg/dL (ref 8–27)
Bilirubin Total: 0.5 mg/dL (ref 0.0–1.2)
CO2: 21 mmol/L (ref 20–29)
Calcium: 9.5 mg/dL (ref 8.6–10.2)
Chloride: 106 mmol/L (ref 96–106)
Creatinine, Ser: 1.05 mg/dL (ref 0.76–1.27)
Globulin, Total: 2.4 g/dL (ref 1.5–4.5)
Glucose: 100 mg/dL — ABNORMAL HIGH (ref 70–99)
Potassium: 4 mmol/L (ref 3.5–5.2)
Sodium: 145 mmol/L — ABNORMAL HIGH (ref 134–144)
Total Protein: 6.8 g/dL (ref 6.0–8.5)
eGFR: 80 mL/min/{1.73_m2} (ref >59)

## 2024-04-28 LAB — PSA, TOTAL AND FREE
PSA, Free Pct: 10.6 %
PSA, Free: 0.19 ng/mL
Prostate Specific Ag, Serum: 1.8 ng/mL (ref 0.0–4.0)

## 2024-04-28 MED ORDER — IOPAMIDOL (ISOVUE-370) INJECTION 76%
75.0000 mL | Freq: Once | INTRAVENOUS | Status: AC | PRN
  Administered 2024-04-28: 75 mL via INTRAVENOUS

## 2024-04-29 ENCOUNTER — Other Ambulatory Visit: Payer: Self-pay | Admitting: Interventional Radiology

## 2024-04-29 DIAGNOSIS — N401 Enlarged prostate with lower urinary tract symptoms: Secondary | ICD-10-CM

## 2024-05-03 ENCOUNTER — Inpatient Hospital Stay
Admission: RE | Admit: 2024-05-03 | Discharge: 2024-05-03 | Disposition: A | Discharge status: Home / Self Care | Source: Ambulatory Visit | Attending: Interventional Radiology

## 2024-05-03 DIAGNOSIS — N401 Enlarged prostate with lower urinary tract symptoms: Secondary | ICD-10-CM

## 2024-05-03 NOTE — Progress Notes (Shared)
 This encounter was conducted via the [employer] providing interactive audio and visual communication.  The patient provided verbal consent to conduct a virtual appointment.  The patient was located at their primary residence during this encounter.  Referring Physician(s): Pace,Maryellen D   Chief Complaint: The patient is seen in virtual telephone visit today for benign prostatic hyperplasia with urinary retention.   History of present illness: HPI from last clinic visit 04/23/24 Henry Walker is a 63 y.o. male with a medical history significant for DM2, HTN, emphysema, gout, early stage prostate cancer (1 core positive 07/25/23, s/p EBRT 11/24) and benign prostatic hyperplasia with lower urinary tract symptoms. He developed severe urinary retention during his radiation therapy and has been unable to void since then. He is unable to tolerate intermittent catheterization and has required foley catheterization.    His urology team discussed TURP versus prostate artery embolization however TURP is not the preferred treatment due to his history of radiation therapy. The patient has been referred to Interventional Radiology to discuss prostate artery embolization as a possible treatment option.    The patient expressed his frustration and depression regarding his dependence on the foley catheter. He has failed multiple voiding trials. We discussed the rationale, periprocedural expectations, risks, and long term expected outcomes of prostate artery embolization. He was agreeable and wished to proceed. A CTA Pelvis was ordered for further work up and he presents today via virtual video visit to discuss these results.   He endorses pain and cramping in his buttocks and thighs with ambulation, which resolves with rest.  A CTA pelvis was performed 04/21/24 and revealed severe aortoiliac peripheral arterial disease.  He also endorsed symptoms of inflow claudication. A CTA abdomen was  ordered for further clarification and this was completed 04/28/24. He presents today via virtual video visit to discuss these results.    Past Medical History:  Diagnosis Date   Benign localized prostatic hyperplasia with lower urinary tract symptoms (LUTS)    Chronic gout    ED (erectile dysfunction)    Emphysema of lung (HCC)    Full dentures    GERD (gastroesophageal reflux disease)    Hx of adenomatous colonic polyps 10/06/2014   Hyperlipidemia    Hypertension    Malignant neoplasm prostate (HCC) 05/2023   urologist--- dr pace/  radiation onologist--- dr patrcia;  dx 08/ 2024, gleason 3 +4   Type 2 diabetes mellitus (HCC)    followed by pcp;    (07-17-2023  per pt checks blood sugar every other day)    Past Surgical History:  Procedure Laterality Date   COLONOSCOPY WITH PROPOFOL   05/19/2019   dr avram   GOLD SEED IMPLANT N/A 07/25/2023   Procedure: GOLD SEED IMPLANT;  Surgeon: Selma Donnice SAUNDERS, MD;  Location: [facility];  Service: Urology;  Laterality: N/A;   IR RADIOLOGIST EVAL & MGMT  04/21/2024   IR RADIOLOGIST EVAL & MGMT  04/23/2024   POSTERIOR FUSION LUMBAR SPINE  09/03/2006   @[facility]  by dr christella. gust;  laminectomy/  disectomy/ fusion L5--S1   SPACE OAR INSTILLATION N/A 07/25/2023   Procedure: SPACE OAR INSTILLATION;  Surgeon: Selma Donnice SAUNDERS, MD;  [facility]  Service: Urology;  Laterality: N/A;    Allergies: Patient has no known allergies.  Medications: Prior to Admission medications   Medication Sig Start Date End Date Taking? Authorizing Provider  albuterol  (VENTOLIN  HFA) 108 (90 Base) MCG/ACT inhaler Inhale 2 puffs into the lungs  every 6 (six) hours as needed for wheezing or shortness of breath. 03/18/23   Delbert Clam, MD  allopurinol  (ZYLOPRIM ) 100 MG tablet Take 1 tablet (100 mg total) by mouth daily. 04/27/24   Newlin, Enobong, MD  amLODipine  (NORVASC ) 10 MG tablet Take 1 tablet (10 mg total) by mouth daily. 04/27/24   Newlin,  Enobong, MD  atorvastatin  (LIPITOR) 80 MG tablet Take 1 tablet (80 mg total) by mouth daily. 04/27/24   Newlin, Enobong, MD  Blood Glucose Monitoring Suppl (ONE TOUCH ULTRA 2) w/Device KIT Use as directed 3 times daily before meals. 07/06/19   Newlin, Enobong, MD  buPROPion  (WELLBUTRIN  XL) 150 MG 24 hr tablet Take 1 tablet (150 mg total) by mouth daily. For smoking cessation 04/27/24   Newlin, Enobong, MD  carvedilol  (COREG ) 12.5 MG tablet Take 1 tablet (12.5 mg total) by mouth 2 (two) times daily with a meal. 04/27/24   Delbert Clam, MD  cetirizine  (ZYRTEC  ALLERGY) 10 MG tablet Take 1 tablet (10 mg total) by mouth daily. 01/06/23   Mayers, Cari S, PA-C  ciprofloxacin  (CIPRO ) 500 MG tablet Take 1 tablet (500 mg total) by mouth 2 (two) times daily. 09/18/23   Patrcia Cough, MD  fluticasone  (FLONASE ) 50 MCG/ACT nasal spray Place 2 sprays into both nostrils daily. Patient taking differently: Place 2 sprays into both nostrils daily as needed for allergies. 01/06/23   Mayers, Cari S, PA-C  glucose blood (ONETOUCH ULTRA) test strip Use as instructed 3 times daily 07/06/19   Newlin, Enobong, MD  Lancet Devices [facility]) lancets Use as instructed 08/04/15   Oleta Berwyn LABOR, NP  Lancets [facility] ULTRASOFT) lancets Use as instructed 3 times daily 07/06/19   Newlin, Enobong, MD  metFORMIN  (GLUCOPHAGE -XR) 500 MG 24 hr tablet Take 1 tablet (500 mg total) by mouth daily with breakfast. 04/27/24   Delbert Clam, MD  oxyCODONE  (OXY IR/ROXICODONE ) 5 MG immediate release tablet Take 1-2 tablets (5-10 mg total) by mouth every 6 (six) hours as needed for severe pain (pain score 7-10). 09/18/23   Patrcia Cough, MD  pantoprazole  (PROTONIX ) 40 MG tablet Take 1 tablet (40 mg total) by mouth daily. 04/27/24   Newlin, Enobong, MD  phenazopyridine (PYRIDIUM) 200 MG tablet Take 200 mg by mouth 3 (three) times daily as needed for pain.    [provider]  potassium chloride  SA (KLOR-CON  M) 20 MEQ tablet Take 2  tablets (40 mEq total) by mouth daily. 09/15/23   Prosperi, Christian H, PA-C  tamsulosin  (FLOMAX ) 0.4 MG CAPS capsule Take 1 capsule (0.4 mg total) by mouth daily after supper. 08/22/23   Patrcia Cough, MD  traZODone  (DESYREL ) 50 MG tablet Take 1 tablet (50 mg total) by mouth at bedtime. 04/27/24   Newlin, Enobong, MD  valsartan -hydrochlorothiazide  (DIOVAN -HCT) 320-25 MG tablet Take 1 tablet by mouth daily. 04/27/24   Newlin, Enobong, MD  tadalafil  (CIALIS ) 20 MG tablet Take 0.5-1 tablets (10-20 mg total) by mouth every other day as needed for erectile dysfunction. Patient not taking: Reported on 09/19/2014 07/21/14 07/17/15  Colton Drape, MD     Family History  Problem Relation Age of Onset   Hypertension Mother    Cirrhosis Father    Hypertension Sister    Stroke Brother    Colon cancer Neg Hx    Esophageal cancer Neg Hx    Rectal cancer Neg Hx    Stomach cancer Neg Hx    Colon polyps Neg Hx     Social History   Socioeconomic  History   Marital status: Single    Spouse name: Not on file   Number of children: Not on file   Years of education: Not on file   Highest education level: Not on file  Occupational History   Not on file  Tobacco Use   Smoking status: Every Day    Current packs/day: 0.50    Types: Cigarettes   Smokeless tobacco: Never   Tobacco comments:    07-17-2023  per pt smoked 1/2 ppd,  smoked since age 66  Vaping Use   Vaping status: Never Used  Substance and Sexual Activity   Alcohol use: No    Alcohol/week: 0.0 standard drinks of alcohol   Drug use: Never   Sexual activity: Not on file  Other Topics Concern   Not on file  Social History Narrative   Retired Customer service manager (gas, Mining engineer, cable)   Social Drivers of Corporate investment banker Strain: Medium Risk (04/13/2024)   Overall Financial Resource Strain (CARDIA)    Difficulty of Paying Living Expenses: Somewhat hard  Food Insecurity: No Food Insecurity (04/13/2024)   Hunger Vital Sign     Worried About Running Out of Food in the Last Year: Never true    Ran Out of Food in the Last Year: Never true  Transportation Needs: No Transportation Needs (04/13/2024)   PRAPARE - Administrator, Civil Service (Medical): No    Lack of Transportation (Non-Medical): No  Physical Activity: Sufficiently Active (04/13/2024)   Exercise Vital Sign    Days of Exercise per Week: 6 days    Minutes of Exercise per Session: 30 min  Stress: No Stress Concern Present (04/13/2024)   [employer] of Occupational Health - Occupational Stress Questionnaire    Feeling of Stress: Not at all  Social Connections: Moderately Integrated (04/13/2024)   Social Connection and Isolation Panel    Frequency of Communication with Friends and Family: More than three times a week    Frequency of Social Gatherings with Friends and Family: More than three times a week    Attends Religious Services: More than 4 times per year    Active Member of [employer] or Organizations: Yes    Attends Banker Meetings: Never    Marital Status: Divorced     Vital Signs: There were no vitals taken for this visit.  Physical Exam Patient is alert, oriented and able to participate fully in the conversation. No apparent discomfort or distress observed. He appears appropriately dressed.    Imaging: US  prostate 06/20/23      CTA Pelvis IMPRESSION: VASCULAR  1. Severe aortoiliac peripheral arterial disease with extensive and irregular/ulcerated fibrofatty atherosclerotic plaque resulting in significant narrowing of the distal aorta, the left common iliac artery and the mid aspect of the right external iliac artery. 2. Both internal iliac arteries remain patent although there is some stenosis of the origin of the anterior division on the left. 3. Right prostatic artery appears to arise proximally from the anterior division. 4. On the left, there appear to be 2 prostatic arteries, 1 arising from a common  trunk with the superior vesicular artery and the second arising just distal to this trunk from the anterior division. This second branch may only supply the seminal vesicles. 5. Increased arterial enhancement along the left wall of the rectum with a prominent middle rectal artery. Suspect possible cross collateral flow between the prostatic arteries and middle rectal artery in this region. Attention at the time  of angiography if attempted.  NON-VASCULAR  1. Diffuse chronic bladder wall thickening with Foley catheter in place. 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 2. Additional ancillary findings as above.     Labs:  CBC: Recent Labs    07/25/23 0722 09/15/23 0920  WBC  --  7.2  HGB 14.3 14.0  HCT 42.0 40.7  PLT  --  195    COAGS: No results for input(s): INR, APTT in the last 8760 hours.  BMP: Recent Labs    07/25/23 0722 09/15/23 0920 04/27/24 1519  NA 141 139 145*  K 3.7 2.8* 4.0  CL 101 104 106  CO2  --  20* 21  GLUCOSE 133* 156* 100*  BUN 15 17 9   CALCIUM   --  9.4 9.5  CREATININE 1.30* 1.29* 1.05  GFRNONAA  --  >60  --     LIVER FUNCTION TESTS: Recent Labs    09/15/23 0920 04/27/24 1519  BILITOT 0.9 0.5  AST 25 22  ALT 31 33  ALKPHOS 59 70  PROT 8.1 6.8  ALBUMIN 4.2 4.4    Assessment and Plan:  63 year old male with a history of prostatic hyperplasia and prostate cancer status post external beam radiation therapy (November 2024) 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 He has failed multiple voiding trials and medical management.  He is very frustrated and depressed with his current situation of having to have the Foley in place.  CTA pelvis was performed 04/21/24 and revealed severe aortoiliac peripheral arterial disease. A CTA abdomen was ordered for further characterization.  Ester Sides, MD Pager: [phone]    I spent a total of 25 Minutes in virtual  video clinical consultation, greater than 50% of which was counseling/coordinating care for benign prostatic hyperplasia.

## 2024-05-06 NOTE — Progress Notes (Signed)
 This encounter was conducted via the [employer] providing interactive audio and visual communication.  The patient provided verbal consent to conduct a virtual appointment.  The patient was located at their primary residence during this encounter.  Referring Physician(s): Pace,Maryellen D   Chief Complaint: The patient is seen in virtual telephone visit today for benign prostatic hyperplasia with urinary retention.   History of present illness: HPI from last clinic visit 04/23/24 Henry Walker is a 63 y.o. male with a medical history significant for DM2, HTN, emphysema, gout, early stage prostate cancer (1 core positive 07/25/23, s/p EBRT 11/24) and benign prostatic hyperplasia with lower urinary tract symptoms. He developed severe urinary retention during his radiation therapy and has been unable to void since then. He is unable to tolerate intermittent catheterization and has required foley catheterization.   His urology team discussed TURP versus prostate artery embolization however TURP is not the preferred treatment due to his history of radiation therapy. The patient has been referred to Interventional Radiology to discuss prostate artery embolization as a possible treatment option.   The patient expressed his frustration and depression regarding his dependence on the foley catheter. He has failed multiple voiding trials. We discussed the rationale, periprocedural expectations, risks, and long term expected outcomes of prostate artery embolization. He was agreeable and wished to proceed. A CTA Pelvis was ordered for further work up and he presents today via virtual video visit to discuss these results.   He endorses pain and cramping in his buttocks and thighs with ambulation, which resolves with rest.   A CTA pelvis was performed 04/21/24 and revealed severe aortoiliac peripheral arterial disease.  He also endorsed symptoms of inflow claudication. A CTA abdomen was  ordered for further clarification and this was completed 04/28/24. He presents today via virtual video visit to discuss these results.  Past Medical History:  Diagnosis Date   Benign localized prostatic hyperplasia with lower urinary tract symptoms (LUTS)    Chronic gout    ED (erectile dysfunction)    Emphysema of lung (HCC)    Full dentures    GERD (gastroesophageal reflux disease)    Hx of adenomatous colonic polyps 10/06/2014   Hyperlipidemia    Hypertension    Malignant neoplasm prostate (HCC) 05/2023   urologist--- dr pace/  radiation onologist--- dr patrcia;  dx 08/ 2024, gleason 3 +4   Type 2 diabetes mellitus (HCC)    followed by pcp;    (07-17-2023  per pt checks blood sugar every other day)    Past Surgical History:  Procedure Laterality Date   COLONOSCOPY WITH PROPOFOL   05/19/2019   dr avram   GOLD SEED IMPLANT N/A 07/25/2023   Procedure: GOLD SEED IMPLANT;  Surgeon: Selma Donnice SAUNDERS, MD;  Location: [facility];  Service: Urology;  Laterality: N/A;   IR RADIOLOGIST EVAL & MGMT  04/21/2024   IR RADIOLOGIST EVAL & MGMT  04/23/2024   POSTERIOR FUSION LUMBAR SPINE  09/03/2006   @[facility]  by dr christella. gust;  laminectomy/  disectomy/ fusion L5--S1   SPACE OAR INSTILLATION N/A 07/25/2023   Procedure: SPACE OAR INSTILLATION;  Surgeon: Selma Donnice SAUNDERS, MD;  Location: [facility];  Service: Urology;  Laterality: N/A;    Allergies: Patient has no known allergies.  Medications: Prior to Admission medications   Medication Sig Start Date End Date Taking? Authorizing Provider  albuterol  (VENTOLIN  HFA) 108 (90 Base) MCG/ACT inhaler Inhale 2 puffs into the lungs every 6 (  six) hours as needed for wheezing or shortness of breath. 03/18/23   Delbert Clam, MD  allopurinol  (ZYLOPRIM ) 100 MG tablet Take 1 tablet (100 mg total) by mouth daily. 04/27/24   Newlin, Enobong, MD  amLODipine  (NORVASC ) 10 MG tablet Take 1 tablet (10 mg total) by mouth daily. 04/27/24   Newlin,  Enobong, MD  atorvastatin  (LIPITOR) 80 MG tablet Take 1 tablet (80 mg total) by mouth daily. 04/27/24   Newlin, Enobong, MD  Blood Glucose Monitoring Suppl (ONE TOUCH ULTRA 2) w/Device KIT Use as directed 3 times daily before meals. 07/06/19   Newlin, Enobong, MD  buPROPion  (WELLBUTRIN  XL) 150 MG 24 hr tablet Take 1 tablet (150 mg total) by mouth daily. For smoking cessation 04/27/24   Newlin, Enobong, MD  carvedilol  (COREG ) 12.5 MG tablet Take 1 tablet (12.5 mg total) by mouth 2 (two) times daily with a meal. 04/27/24   Delbert Clam, MD  cetirizine  (ZYRTEC  ALLERGY) 10 MG tablet Take 1 tablet (10 mg total) by mouth daily. 01/06/23   Mayers, Cari S, PA-C  ciprofloxacin  (CIPRO ) 500 MG tablet Take 1 tablet (500 mg total) by mouth 2 (two) times daily. 09/18/23   Patrcia Cough, MD  fluticasone  (FLONASE ) 50 MCG/ACT nasal spray Place 2 sprays into both nostrils daily. Patient taking differently: Place 2 sprays into both nostrils daily as needed for allergies. 01/06/23   Mayers, Cari S, PA-C  glucose blood (ONETOUCH ULTRA) test strip Use as instructed 3 times daily 07/06/19   Newlin, Enobong, MD  Lancet Devices [facility]) lancets Use as instructed 08/04/15   Oleta Berwyn LABOR, NP  Lancets [facility] ULTRASOFT) lancets Use as instructed 3 times daily 07/06/19   Newlin, Enobong, MD  metFORMIN  (GLUCOPHAGE -XR) 500 MG 24 hr tablet Take 1 tablet (500 mg total) by mouth daily with breakfast. 04/27/24   Delbert Clam, MD  oxyCODONE  (OXY IR/ROXICODONE ) 5 MG immediate release tablet Take 1-2 tablets (5-10 mg total) by mouth every 6 (six) hours as needed for severe pain (pain score 7-10). 09/18/23   Patrcia Cough, MD  pantoprazole  (PROTONIX ) 40 MG tablet Take 1 tablet (40 mg total) by mouth daily. 04/27/24   Newlin, Enobong, MD  phenazopyridine (PYRIDIUM) 200 MG tablet Take 200 mg by mouth 3 (three) times daily as needed for pain.    [provider]  potassium chloride  SA (KLOR-CON  M) 20 MEQ tablet Take 2  tablets (40 mEq total) by mouth daily. 09/15/23   Prosperi, Christian H, PA-C  tamsulosin  (FLOMAX ) 0.4 MG CAPS capsule Take 1 capsule (0.4 mg total) by mouth daily after supper. 08/22/23   Patrcia Cough, MD  traZODone  (DESYREL ) 50 MG tablet Take 1 tablet (50 mg total) by mouth at bedtime. 04/27/24   Newlin, Enobong, MD  valsartan -hydrochlorothiazide  (DIOVAN -HCT) 320-25 MG tablet Take 1 tablet by mouth daily. 04/27/24   Newlin, Enobong, MD  tadalafil  (CIALIS ) 20 MG tablet Take 0.5-1 tablets (10-20 mg total) by mouth every other day as needed for erectile dysfunction. Patient not taking: Reported on 09/19/2014 07/21/14 07/17/15  Colton Drape, MD     Family History  Problem Relation Age of Onset   Hypertension Mother    Cirrhosis Father    Hypertension Sister    Stroke Brother    Colon cancer Neg Hx    Esophageal cancer Neg Hx    Rectal cancer Neg Hx    Stomach cancer Neg Hx    Colon polyps Neg Hx     Social History   Socioeconomic History  Marital status: Single    Spouse name: Not on file   Number of children: Not on file   Years of education: Not on file   Highest education level: Not on file  Occupational History   Not on file  Tobacco Use   Smoking status: Every Day    Current packs/day: 0.50    Types: Cigarettes   Smokeless tobacco: Never   Tobacco comments:    07-17-2023  per pt smoked 1/2 ppd,  smoked since age 66  Vaping Use   Vaping status: Never Used  Substance and Sexual Activity   Alcohol use: No    Alcohol/week: 0.0 standard drinks of alcohol   Drug use: Never   Sexual activity: Not on file  Other Topics Concern   Not on file  Social History Narrative   Retired Customer service manager (gas, Mining engineer, cable)   Social Drivers of Corporate investment banker Strain: Medium Risk (04/13/2024)   Overall Financial Resource Strain (CARDIA)    Difficulty of Paying Living Expenses: Somewhat hard  Food Insecurity: No Food Insecurity (04/13/2024)   Hunger Vital Sign     Worried About Running Out of Food in the Last Year: Never true    Ran Out of Food in the Last Year: Never true  Transportation Needs: No Transportation Needs (04/13/2024)   PRAPARE - Administrator, Civil Service (Medical): No    Lack of Transportation (Non-Medical): No  Physical Activity: Sufficiently Active (04/13/2024)   Exercise Vital Sign    Days of Exercise per Week: 6 days    Minutes of Exercise per Session: 30 min  Stress: No Stress Concern Present (04/13/2024)   [employer] of Occupational Health - Occupational Stress Questionnaire    Feeling of Stress: Not at all  Social Connections: Moderately Integrated (04/13/2024)   Social Connection and Isolation Panel    Frequency of Communication with Friends and Family: More than three times a week    Frequency of Social Gatherings with Friends and Family: More than three times a week    Attends Religious Services: More than 4 times per year    Active Member of [employer] or Organizations: Yes    Attends Banker Meetings: Never    Marital Status: Divorced     Vital Signs: There were no vitals taken for this visit.  Physical Exam  Patient is alert, oriented and able to participate fully in the conversation. No apparent discomfort or distress observed. He appears appropriately dressed.    Imaging: US  prostate 06/20/23      CTA Pelvis IMPRESSION: VASCULAR  1. Severe aortoiliac peripheral arterial disease with extensive and irregular/ulcerated fibrofatty atherosclerotic plaque resulting in significant narrowing of the distal aorta, the left common iliac artery and the mid aspect of the right external iliac artery. 2. Both internal iliac arteries remain patent although there is some stenosis of the origin of the anterior division on the left. 3. Right prostatic artery appears to arise proximally from the anterior division. 4. On the left, there appear to be 2 prostatic arteries, 1 arising from a  common trunk with the superior vesicular artery and the second arising just distal to this trunk from the anterior division. This second branch may only supply the seminal vesicles. 5. Increased arterial enhancement along the left wall of the rectum with a prominent middle rectal artery. Suspect possible cross collateral flow between the prostatic arteries and middle rectal artery in this region. Attention at the time of angiography  if attempted.  NON-VASCULAR  1. Diffuse chronic bladder wall thickening with Foley catheter in place. 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 2. Additional ancillary findings as above.   CTA Abdomen IMPRESSION: VASCULAR: Occlusion of the left common iliac artery with reconstitution of flow in the internal iliac artery. Stenosis of the right common iliac artery.  Stenosis of the distal aorta at the bifurcation.  NON-VASCULAR: Bilateral adrenal nodules. Recommend adrenal protocol CT.  Right kidney cysts.     Labs:  CBC: Recent Labs    07/25/23 0722 09/15/23 0920  WBC  --  7.2  HGB 14.3 14.0  HCT 42.0 40.7  PLT  --  195    COAGS: No results for input(s): INR, APTT in the last 8760 hours.  BMP: Recent Labs    07/25/23 0722 09/15/23 0920 04/27/24 1519  NA 141 139 145*  K 3.7 2.8* 4.0  CL 101 104 106  CO2  --  20* 21  GLUCOSE 133* 156* 100*  BUN 15 17 9   CALCIUM   --  9.4 9.5  CREATININE 1.30* 1.29* 1.05  GFRNONAA  --  >60  --     LIVER FUNCTION TESTS: Recent Labs    09/15/23 0920 04/27/24 1519  BILITOT 0.9 0.5  AST 25 22  ALT 31 33  ALKPHOS 59 70  PROT 8.1 6.8  ALBUMIN 4.2 4.4    Assessment and Plan:  63 year old male with a history of prostatic hyperplasia and prostate cancer status post external beam radiation therapy (November 2024) 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 He has failed multiple voiding trials and medical management.   He is very frustrated and depressed with his current situation of having to have the Foley in place. CTA abdomen/pelvis demonstrates severe aortoiliac peripheral arterial disease.  He endorses symptoms of inflow claudication.   -Plan for prostate artery embolization via bilateral common femoral artery access - tentatively planned for 05/26/24 -IR will place referral to Vascular Surgery for evaluation and management of aortoiliac occlusive disease   Ester Sides, MD Pager: [phone]   I spent a total of 25 Minutes in virtual video clinical consultation, greater than 50% of which was counseling/coordinating care for benign prostatic hyperplasia.

## 2024-05-07 ENCOUNTER — Other Ambulatory Visit (HOSPITAL_COMMUNITY): Payer: Self-pay | Admitting: Interventional Radiology

## 2024-05-07 ENCOUNTER — Ambulatory Visit
Admission: RE | Admit: 2024-05-07 | Discharge: 2024-05-07 | Disposition: A | Discharge status: Home / Self Care | Source: Ambulatory Visit | Attending: Interventional Radiology | Admitting: Interventional Radiology

## 2024-05-07 DIAGNOSIS — I7409 Other arterial embolism and thrombosis of abdominal aorta: Secondary | ICD-10-CM

## 2024-05-07 DIAGNOSIS — N401 Enlarged prostate with lower urinary tract symptoms: Secondary | ICD-10-CM

## 2024-05-07 HISTORY — PX: IR RADIOLOGIST EVAL & MGMT: IMG5224

## 2024-05-19 ENCOUNTER — Telehealth (HOSPITAL_COMMUNITY): Payer: Self-pay | Admitting: Student

## 2024-05-19 ENCOUNTER — Other Ambulatory Visit (HOSPITAL_COMMUNITY): Payer: Self-pay | Admitting: Student

## 2024-05-19 DIAGNOSIS — N138 Other obstructive and reflux uropathy: Secondary | ICD-10-CM

## 2024-05-19 MED ORDER — CIPROFLOXACIN HCL 500 MG PO TABS: 500.0000 mg | ORAL_TABLET | Freq: Two times a day (BID) | ORAL | 0 refills | Status: AC

## 2024-05-19 MED ORDER — PHENAZOPYRIDINE HCL 100 MG PO TABS: 100.0000 mg | ORAL_TABLET | Freq: Three times a day (TID) | ORAL | 0 refills | Status: AC

## 2024-05-19 MED ORDER — SOLIFENACIN SUCCINATE 5 MG PO TABS: 5.0000 mg | ORAL_TABLET | Freq: Every day | ORAL | 0 refills | Status: AC

## 2024-05-19 NOTE — Telephone Encounter (Signed)
 Patient scheduled for prostate artery embolization 05/26/24 with Dr. Jennefer. Post-procedure medications e-prescribed to his preferred pharmacy. Patient is aware. Patient is also aware the foley catheter will remain in place approximately 3-4 weeks post-procedure and he will need to have a voiding trial with Urology.   Warren Dais, AGACNP-BC 05/19/2024, 2:54 PM

## 2024-05-25 ENCOUNTER — Other Ambulatory Visit (HOSPITAL_COMMUNITY): Payer: Self-pay | Admitting: Radiology

## 2024-05-25 NOTE — H&P (Signed)
 Chief Complaint: Patient was seen in consultation today for benign prostatic hyperplasia with urinary retention   Procedure: Prostate Artery Embolization  Referring Physician(s): Suttle,Dylan J  Supervising Physician: Jennefer Rover  Patient Status: Henry Walker - Out-pt  History of Present Illness: Henry Walker is a 63 y.o. male with a history of HLD, ED, T2DM, early stage prostate cancer (1 core positive 07/25/23, s/p EBRT 11/24) and benign prostatic hyperplasia with lower urinary tract symptoms. During his radiation therapy, he developed severe urinary retention and has been unable to void since. He is unable to tolerate intermittent catheterization and has required foley catheterization. Given his history of radiation therapy, the patient is not an ideal candidate for TURP and was referred to IR for possible prostate artery embolization. He was seen by Dr. Jennefer in clinic and was deemed an appropriate candidate for PAE. CTA pelvis on 04/21/24 revealed severe aortoiliac peripheral arterial disease leading to subsequent CTA abdomen significant for occlusion of the left common iliac artery w/ reconstitution of flow in the internal iliac artery and stenosis of the right common iliac artery.    Patient presents today for his procedure. Currently resting in bed in no acute distress. States that he has been feeling well overall, but is rest Denies any chest pain, shortness of breath, constipation, diarrhea, fevers/chills, abdominal pain, or pain around his foley site. NPO since midnight. All questions and concerns answered at the bedside.   Code Status: Full Code  Past Medical History:  Diagnosis Date   Benign localized prostatic hyperplasia with lower urinary tract symptoms (LUTS)    Chronic gout    ED (erectile dysfunction)    Emphysema of lung (HCC)    Full dentures    GERD (gastroesophageal reflux disease)    Hx of adenomatous colonic polyps 10/06/2014   Hyperlipidemia     Hypertension    Malignant neoplasm prostate (HCC) 05/2023   urologist--- dr pace/  radiation onologist--- dr patrcia;  dx 08/ 2024, gleason 3 +4   Type 2 diabetes mellitus (HCC)    followed by pcp;    (07-17-2023  per pt checks blood sugar every other day)    Past Surgical History:  Procedure Laterality Date   COLONOSCOPY WITH PROPOFOL   05/19/2019   dr avram   GOLD SEED IMPLANT N/A 07/25/2023   Procedure: GOLD SEED IMPLANT;  Surgeon: Selma Donnice SAUNDERS, MD;  Location: [facility];  Service: Urology;  Laterality: N/A;   IR RADIOLOGIST EVAL & MGMT  04/21/2024   IR RADIOLOGIST EVAL & MGMT  04/23/2024   IR RADIOLOGIST EVAL & MGMT  05/07/2024   POSTERIOR FUSION LUMBAR SPINE  09/03/2006   @[facility]  by dr christella. gust;  laminectomy/  disectomy/ fusion L5--S1   SPACE OAR INSTILLATION N/A 07/25/2023   Procedure: SPACE OAR INSTILLATION;  Surgeon: Selma Donnice SAUNDERS, MD;  Location: [facility];  Service: Urology;  Laterality: N/A;    Allergies: Patient has no known allergies.  Medications: Prior to Admission medications   Medication Sig Start Date End Date Taking? Authorizing Provider  albuterol  (VENTOLIN  HFA) 108 (90 Base) MCG/ACT inhaler Inhale 2 puffs into the lungs every 6 (six) hours as needed for wheezing or shortness of breath. 03/18/23   Delbert Clam, MD  allopurinol  (ZYLOPRIM ) 100 MG tablet Take 1 tablet (100 mg total) by mouth daily. 04/27/24   Newlin, Enobong, MD  amLODipine  (NORVASC ) 10 MG tablet Take 1 tablet (10 mg total) by mouth daily. 04/27/24   Newlin, Enobong, MD  atorvastatin  (LIPITOR) 80 MG tablet Take 1 tablet (80 mg total) by mouth daily. 04/27/24   Newlin, Enobong, MD  Blood Glucose Monitoring Suppl (ONE TOUCH ULTRA 2) w/Device KIT Use as directed 3 times daily before meals. 07/06/19   Newlin, Enobong, MD  buPROPion  (WELLBUTRIN  XL) 150 MG 24 hr tablet Take 1 tablet (150 mg total) by mouth daily. For smoking cessation 04/27/24   Newlin, Enobong, MD  carvedilol   (COREG ) 12.5 MG tablet Take 1 tablet (12.5 mg total) by mouth 2 (two) times daily with a meal. 04/27/24   Delbert Clam, MD  cetirizine  (ZYRTEC  ALLERGY) 10 MG tablet Take 1 tablet (10 mg total) by mouth daily. 01/06/23   Mayers, Cari S, PA-C  ciprofloxacin  (CIPRO ) 500 MG tablet Take 1 tablet (500 mg total) by mouth 2 (two) times daily. 09/18/23   Patrcia Cough, MD  ciprofloxacin  (CIPRO ) 500 MG tablet Take 1 tablet (500 mg total) by mouth 2 (two) times daily for 7 days. 05/26/24 06/02/24  Covington, Jamie R, NP  fluticasone  (FLONASE ) 50 MCG/ACT nasal spray Place 2 sprays into both nostrils daily. Patient taking differently: Place 2 sprays into both nostrils daily as needed for allergies. 01/06/23   Mayers, Cari S, PA-C  glucose blood (ONETOUCH ULTRA) test strip Use as instructed 3 times daily 07/06/19   Newlin, Enobong, MD  Lancet Devices [facility]) lancets Use as instructed 08/04/15   Oleta Berwyn LABOR, NP  Lancets [facility] ULTRASOFT) lancets Use as instructed 3 times daily 07/06/19   Newlin, Enobong, MD  metFORMIN  (GLUCOPHAGE -XR) 500 MG 24 hr tablet Take 1 tablet (500 mg total) by mouth daily with breakfast. 04/27/24   Delbert Clam, MD  oxyCODONE  (OXY IR/ROXICODONE ) 5 MG immediate release tablet Take 1-2 tablets (5-10 mg total) by mouth every 6 (six) hours as needed for severe pain (pain score 7-10). 09/18/23   Patrcia Cough, MD  pantoprazole  (PROTONIX ) 40 MG tablet Take 1 tablet (40 mg total) by mouth daily. 04/27/24   Newlin, Enobong, MD  phenazopyridine  (PYRIDIUM ) 100 MG tablet Take 1 tablet (100 mg total) by mouth in the morning, at noon, and at bedtime for 7 days. 05/26/24 06/02/24  Covington, Jamie R, NP  phenazopyridine  (PYRIDIUM ) 200 MG tablet Take 200 mg by mouth 3 (three) times daily as needed for pain.    [provider]  potassium chloride  SA (KLOR-CON  M) 20 MEQ tablet Take 2 tablets (40 mEq total) by mouth daily. 09/15/23   Prosperi, Christian H, PA-C  solifenacin  (VESICARE ) 5  MG tablet Take 1 tablet (5 mg total) by mouth daily for 7 days. 05/26/24 06/02/24  Covington, Jamie R, NP  tamsulosin  (FLOMAX ) 0.4 MG CAPS capsule Take 1 capsule (0.4 mg total) by mouth daily after supper. 08/22/23   Patrcia Cough, MD  traZODone  (DESYREL ) 50 MG tablet Take 1 tablet (50 mg total) by mouth at bedtime. 04/27/24   Newlin, Enobong, MD  valsartan -hydrochlorothiazide  (DIOVAN -HCT) 320-25 MG tablet Take 1 tablet by mouth daily. 04/27/24   Newlin, Enobong, MD  tadalafil  (CIALIS ) 20 MG tablet Take 0.5-1 tablets (10-20 mg total) by mouth every other day as needed for erectile dysfunction. Patient not taking: Reported on 09/19/2014 07/21/14 07/17/15  Colton Drape, MD     Family History  Problem Relation Age of Onset   Hypertension Mother    Cirrhosis Father    Hypertension Sister    Stroke Brother    Colon cancer Neg Hx    Esophageal cancer Neg Hx    Rectal cancer Neg  Hx    Stomach cancer Neg Hx    Colon polyps Neg Hx     Social History   Socioeconomic History   Marital status: Single    Spouse name: Not on file   Number of children: Not on file   Years of education: Not on file   Highest education level: Not on file  Occupational History   Not on file  Tobacco Use   Smoking status: Every Day    Current packs/day: 0.50    Types: Cigarettes   Smokeless tobacco: Never   Tobacco comments:    07-17-2023  per pt smoked 1/2 ppd,  smoked since age 76  Vaping Use   Vaping status: Never Used  Substance and Sexual Activity   Alcohol use: No    Alcohol/week: 0.0 standard drinks of alcohol   Drug use: Never   Sexual activity: Not on file  Other Topics Concern   Not on file  Social History Narrative   Retired Customer service manager (gas, Mining engineer, cable)   Social Drivers of Corporate investment banker Strain: Medium Risk (04/13/2024)   Overall Financial Resource Strain (CARDIA)    Difficulty of Paying Living Expenses: Somewhat hard  Food Insecurity: No Food Insecurity  (04/13/2024)   Hunger Vital Sign    Worried About Running Out of Food in the Last Year: Never true    Ran Out of Food in the Last Year: Never true  Transportation Needs: No Transportation Needs (04/13/2024)   PRAPARE - Administrator, Civil Service (Medical): No    Lack of Transportation (Non-Medical): No  Physical Activity: Sufficiently Active (04/13/2024)   Exercise Vital Sign    Days of Exercise per Week: 6 days    Minutes of Exercise per Session: 30 min  Stress: No Stress Concern Present (04/13/2024)   [employer] of Occupational Health - Occupational Stress Questionnaire    Feeling of Stress: Not at all  Social Connections: Moderately Integrated (04/13/2024)   Social Connection and Isolation Panel    Frequency of Communication with Friends and Family: More than three times a week    Frequency of Social Gatherings with Friends and Family: More than three times a week    Attends Religious Services: More than 4 times per year    Active Member of [employer] or Organizations: Yes    Attends Banker Meetings: Never    Marital Status: Divorced    Review of Systems Denies any N/V, chest pain, shortness of breath, fevers/chills. All other ROS negative.  Vital Signs: BP 131/71   Pulse (!) 55   Temp 97.9 F (36.6 C)   Resp 16   Ht 5' 7 (1.702 m)   Wt 167 lb (75.8 kg)   SpO2 99%   BMI 26.16 kg/m    Physical Exam Vitals reviewed.  Constitutional:      Appearance: Normal appearance.  HENT:     Head: Normocephalic and atraumatic.     Mouth/Throat:     Mouth: Mucous membranes are moist.     Pharynx: Oropharynx is clear.  Cardiovascular:     Rate and Rhythm: Normal rate and regular rhythm.  Pulmonary:     Effort: Pulmonary effort is normal.     Breath sounds: Normal breath sounds.  Abdominal:     General: Abdomen is flat.     Palpations: Abdomen is soft.     Tenderness: There is no abdominal tenderness.  Genitourinary:    Comments: Foley in  place  Musculoskeletal:        General: Normal range of motion.     Cervical back: Normal range of motion.  Skin:    General: Skin is warm and dry.  Neurological:     General: No focal deficit present.     Mental Status: He is alert and oriented to person, place, and time.  Psychiatric:        Mood and Affect: Mood normal.     Imaging: IR Radiologist Eval & Mgmt Result Date: 05/07/2024 EXAM: ESTABLISHED PATIENT OFFICE VISIT CHIEF COMPLAINT: See Epic note. HISTORY OF PRESENT ILLNESS: See Epic note. REVIEW OF SYSTEMS: See Epic note. PHYSICAL EXAMINATION: See Epic note. ASSESSMENT AND PLAN: See Epic note. Ester Sides, MD [facility] Electronically Signed   By: Ester Sides M.D.   On: 05/07/2024 13:57   CT ANGIO ABDOMEN W &/OR WO CONTRAST Result Date: 05/01/2024 CLINICAL DATA:  Aortoiliac atheromatous disease. EXAM: CT ANGIOGRAPHY ABDOMEN TECHNIQUE: Multidetector CT imaging of the abdomen was performed using the standard protocol during bolus administration of intravenous contrast. Multiplanar reconstructed images and MIPs were obtained and reviewed to evaluate the vascular anatomy. RADIATION DOSE REDUCTION: This exam was performed according to the departmental dose-optimization program which includes automated exposure control, adjustment of the mA and/or kV according to patient size and/or use of iterative reconstruction technique. CONTRAST:  75mL ISOVUE -370 IOPAMIDOL  (ISOVUE -370) INJECTION 76% COMPARISON:  None Available. FINDINGS: VASCULAR Aorta: Atheromatous disease. No aneurysm or dissection. Stenosis of the distal aorta at the bifurcation. Celiac: Patent without evidence of aneurysm, dissection, vasculitis or significant stenosis. SMA: Patent without evidence of aneurysm, dissection, vasculitis or significant stenosis. Renals: Both renal arteries are patent without evidence of aneurysm, dissection, vasculitis, fibromuscular dysplasia or  significant stenosis. IMA: Patent without evidence of aneurysm, dissection, vasculitis or significant stenosis. Inflow: Proximal left common iliac artery stenosis and occlusion. Reconstitution of flow of the internal iliac artery in the pelvis. Images were not obtained to the femoral level. Stenosis also noted further right common iliac artery which appeared nonocclusive. Review of the MIP images confirms the above findings. NON-VASCULAR Lower chest: No acute abnormality. No pleural or pericardial effusions. Hepatobiliary: No focal liver abnormality is seen. No gallstones, gallbladder wall thickening, or biliary dilatation. Pancreas: Unremarkable. No pancreatic ductal dilatation or surrounding inflammatory changes. Spleen: Normal in size without focal abnormality. Adrenals/Urinary Tract: Indeterminate bilateral adrenal nodules, 1.6 cm on the left and 1.6 cm on the right. No nephrolithiasis or hydronephrosis. Right kidney cysts measuring up to 4.5 cm. Stomach/Bowel: Stomach is within normal limits. No evidence of bowel wall thickening, distention, or inflammatory changes. Lymphatic: No suspicious adenopathy. Other: No abdominal wall hernia or abnormality. No abdominopelvic ascites. Musculoskeletal: No acute or significant osseous findings. IMPRESSION: VASCULAR: Occlusion of the left common iliac artery with reconstitution of flow in the internal iliac artery. Stenosis of the right common iliac artery. Stenosis of the distal aorta at the bifurcation. NON-VASCULAR: Bilateral adrenal nodules. Recommend adrenal protocol CT. Right kidney cysts. Electronically Signed   By: Fonda Field M.D.   On: 05/01/2024 19:49    Labs:  CBC: Recent Labs    07/25/23 0722 09/15/23 0920 05/26/24 0754  WBC  --  7.2 5.8  HGB 14.3 14.0 13.2  HCT 42.0 40.7 39.9  PLT  --  195 242    COAGS: Recent Labs    05/26/24 0754  INR 1.1    BMP: Recent Labs    07/25/23 0722 09/15/23 0920 04/27/24 1519  05/26/24 0754  NA  141 139 145* 137  K 3.7 2.8* 4.0 3.8  CL 101 104 106 104  CO2  --  20* 21 23  GLUCOSE 133* 156* 100* 121*  BUN 15 17 9 14   CALCIUM   --  9.4 9.5 9.3  CREATININE 1.30* 1.29* 1.05 1.13  GFRNONAA  --  >60  --  >60    LIVER FUNCTION TESTS: Recent Labs    09/15/23 0920 04/27/24 1519  BILITOT 0.9 0.5  AST 25 22  ALT 31 33  ALKPHOS 59 70  PROT 8.1 6.8  ALBUMIN 4.2 4.4    TUMOR MARKERS: No results for input(s): AFPTM, CEA, CA199, CHROMGRNA in the last 8760 hours.  Assessment and Plan:  BPH with LUTS: Henry Walker is a 63 y.o. male with a history of prostate cancers s/p radiation therapy complicated by post radiation urinary retention requiring indwelling foley catheterization. Patient has failed multiple voiding trials and given his history of radiation therapy is not a great candidate for TURP. CTA Abdomen/pelvis demonstrated severe aortoiliac peripheral artery disease.   -Plan for bilateral common femoral artery access d/t severe PAD -Referral to Vascular surgery to be placed for further management  -NPO since midnight  -Labs reviewed -VSS -Plan for prostate artery embolization with Dr. JONETTA Sides under moderate sedation   The Risks and benefits of prostate artery embolization were discussed with the patient including, but not limited to bleeding, infection, vascular injury, post operative pain, or contrast induced renal failure.  This procedure involves the use of X-rays and because of the nature of the planned procedure, it is possible that we will have prolonged use of X-ray fluoroscopy.  Potential radiation risks to you include (but are not limited to) the following: - A slightly elevated risk for cancer several years later in life. This risk is typically less than 0.5% percent. This risk is low in comparison to the normal incidence of human cancer, which is 33% for women and 50% for men according to the American Cancer Society. - Radiation induced injury  can include skin redness, resembling a rash, tissue breakdown / ulcers and hair loss (which can be temporary or permanent).   The likelihood of either of these occurring depends on the difficulty of the procedure and whether you are sensitive to radiation due to previous procedures, disease, or genetic conditions.   IF your procedure requires a prolonged use of radiation, you will be notified and given written instructions for further action.  It is your responsibility to monitor the irradiated area for the 2 weeks following the procedure and to notify your physician if you are concerned that you have suffered a radiation induced injury.    All of the patient's questions were answered, patient is agreeable to proceed. Consent signed and in chart.   Thank you for this interesting consult. I greatly enjoyed meeting [facility] and look forward to participating in their care. A copy of this report was sent to the requesting provider on this date.  Electronically Signed: Glennon CHRISTELLA Bal, PA-C 05/26/2024, 8:45 AM   I spent a total of 15 Minutes in face to face clinical consultation, greater than 50% of which was counseling/coordinating care for prostate artery embolization.

## 2024-05-26 ENCOUNTER — Other Ambulatory Visit (HOSPITAL_COMMUNITY): Payer: Self-pay | Admitting: Interventional Radiology

## 2024-05-26 ENCOUNTER — Other Ambulatory Visit: Payer: Self-pay

## 2024-05-26 ENCOUNTER — Ambulatory Visit (HOSPITAL_COMMUNITY)
Admission: RE | Admit: 2024-05-26 | Discharge: 2024-05-26 | Disposition: A | Discharge status: Home / Self Care | Source: Ambulatory Visit | Attending: Interventional Radiology | Admitting: Interventional Radiology

## 2024-05-26 DIAGNOSIS — F17210 Nicotine dependence, cigarettes, uncomplicated: Secondary | ICD-10-CM | POA: Insufficient documentation

## 2024-05-26 DIAGNOSIS — E1151 Type 2 diabetes mellitus with diabetic peripheral angiopathy without gangrene: Secondary | ICD-10-CM | POA: Diagnosis not present

## 2024-05-26 DIAGNOSIS — Z923 Personal history of irradiation: Secondary | ICD-10-CM | POA: Insufficient documentation

## 2024-05-26 DIAGNOSIS — E785 Hyperlipidemia, unspecified: Secondary | ICD-10-CM | POA: Diagnosis not present

## 2024-05-26 DIAGNOSIS — R338 Other retention of urine: Secondary | ICD-10-CM | POA: Insufficient documentation

## 2024-05-26 DIAGNOSIS — Z8546 Personal history of malignant neoplasm of prostate: Secondary | ICD-10-CM | POA: Insufficient documentation

## 2024-05-26 DIAGNOSIS — Z7984 Long term (current) use of oral hypoglycemic drugs: Secondary | ICD-10-CM | POA: Insufficient documentation

## 2024-05-26 DIAGNOSIS — N138 Other obstructive and reflux uropathy: Secondary | ICD-10-CM | POA: Diagnosis not present

## 2024-05-26 DIAGNOSIS — I739 Peripheral vascular disease, unspecified: Secondary | ICD-10-CM

## 2024-05-26 DIAGNOSIS — N401 Enlarged prostate with lower urinary tract symptoms: Secondary | ICD-10-CM | POA: Diagnosis present

## 2024-05-26 HISTORY — PX: IR US GUIDE VASC ACCESS RIGHT: IMG2390

## 2024-05-26 HISTORY — PX: IR EMBO ARTERIAL NOT HEMORR HEMANG INC GUIDE ROADMAPPING: IMG5448

## 2024-05-26 HISTORY — PX: IR ANGIOGRAM PELVIS SELECTIVE OR SUPRASELECTIVE: IMG661

## 2024-05-26 HISTORY — PX: IR US GUIDE VASC ACCESS LEFT: IMG2389

## 2024-05-26 LAB — BASIC METABOLIC PANEL WITH GFR
Anion gap: 10 (ref 5–15)
BUN: 14 mg/dL (ref 8–23)
CO2: 23 mmol/L (ref 22–32)
Calcium: 9.3 mg/dL (ref 8.9–10.3)
Chloride: 104 mmol/L (ref 98–111)
Creatinine, Ser: 1.13 mg/dL (ref 0.61–1.24)
GFR, Estimated: >60 mL/min (ref >60)
Glucose, Bld: 121 mg/dL — ABNORMAL HIGH (ref 70–99)
Potassium: 3.8 mmol/L (ref 3.5–5.1)
Sodium: 137 mmol/L (ref 135–145)

## 2024-05-26 LAB — CBC
HCT: 39.9 % (ref 39.0–52.0)
Hemoglobin: 13.2 g/dL (ref 13.0–17.0)
MCH: 27.2 pg (ref 26.0–34.0)
MCHC: 33.1 g/dL (ref 30.0–36.0)
MCV: 82.1 fL (ref 80.0–100.0)
Platelets: 242 K/uL (ref 150–400)
RBC: 4.86 MIL/uL (ref 4.22–5.81)
RDW: 14.8 % (ref 11.5–15.5)
WBC: 5.8 K/uL (ref 4.0–10.5)
nRBC: 0 % (ref 0.0–0.2)

## 2024-05-26 LAB — PROTIME-INR
INR: 1.1 (ref 0.8–1.2)
Prothrombin Time: 15 s (ref 11.4–15.2)

## 2024-05-26 LAB — GLUCOSE, CAPILLARY: Glucose-Capillary: 143 mg/dL — ABNORMAL HIGH (ref 70–99)

## 2024-05-26 MED ORDER — SODIUM CHLORIDE (PF) 0.9 % IJ SOLN
INTRAVENOUS | Status: AC | PRN
  Administered 2024-05-26: 200 ug via INTRA_ARTERIAL

## 2024-05-26 MED ORDER — SODIUM CHLORIDE 0.9 % IV SOLN: INTRAVENOUS | Status: DC

## 2024-05-26 MED ORDER — LIDOCAINE-EPINEPHRINE 1 %-1:100000 IJ SOLN
INTRAMUSCULAR | Status: AC
Start: 2024-05-26 — End: 2024-05-26
  Filled 2024-05-26: qty 1

## 2024-05-26 MED ORDER — IOHEXOL 300 MG/ML  SOLN
100.0000 mL | Freq: Once | INTRAMUSCULAR | Status: AC | PRN
  Administered 2024-05-26: 13 mL via INTRA_ARTERIAL

## 2024-05-26 MED ORDER — MIDAZOLAM HCL 2 MG/2ML IJ SOLN
INTRAMUSCULAR | Status: AC
  Filled 2024-05-26: qty 2

## 2024-05-26 MED ORDER — PREDNISONE 20 MG PO TABS
20.0000 mg | ORAL_TABLET | Freq: Once | ORAL | Status: AC
  Administered 2024-05-26: 20 mg via ORAL
  Filled 2024-05-26: qty 1

## 2024-05-26 MED ORDER — OXYCODONE HCL 5 MG PO TABS
5.0000 mg | ORAL_TABLET | Freq: Once | ORAL | Status: AC
  Administered 2024-05-26: 5 mg via ORAL
  Filled 2024-05-26: qty 1

## 2024-05-26 MED ORDER — FENTANYL CITRATE (PF) 100 MCG/2ML IJ SOLN
INTRAMUSCULAR | Status: AC
  Filled 2024-05-26: qty 2

## 2024-05-26 MED ORDER — MIDAZOLAM HCL 2 MG/2ML IJ SOLN
INTRAMUSCULAR | Status: AC | PRN
  Administered 2024-05-26: .5 mg via INTRAVENOUS
  Administered 2024-05-26: 1 mg via INTRAVENOUS

## 2024-05-26 MED ORDER — NITROGLYCERIN 1 MG/10 ML FOR IR/CATH LAB
INTRA_ARTERIAL | Status: AC
Start: 2024-05-26 — End: 2024-05-26
  Filled 2024-05-26: qty 10

## 2024-05-26 MED ORDER — IOHEXOL 300 MG/ML  SOLN
150.0000 mL | Freq: Once | INTRAMUSCULAR | Status: AC | PRN
  Administered 2024-05-26: 50 mL via INTRA_ARTERIAL

## 2024-05-26 MED ORDER — CIPROFLOXACIN IN D5W 400 MG/200ML IV SOLN
400.0000 mg | INTRAVENOUS | Status: AC
  Administered 2024-05-26: 400 mg via INTRAVENOUS
  Filled 2024-05-26: qty 200

## 2024-05-26 MED ORDER — LIDOCAINE-PRILOCAINE 2.5-2.5 % EX CREA
TOPICAL_CREAM | Freq: Once | CUTANEOUS | Status: AC
  Filled 2024-05-26: qty 5

## 2024-05-26 MED ORDER — NITROGLYCERIN 2 % TD OINT
1.0000 [in_us] | TOPICAL_OINTMENT | Freq: Once | TRANSDERMAL | Status: AC
  Administered 2024-05-26: 1 [in_us] via TOPICAL
  Filled 2024-05-26: qty 1

## 2024-05-26 MED ORDER — FENTANYL CITRATE (PF) 100 MCG/2ML IJ SOLN
INTRAMUSCULAR | Status: AC | PRN
  Administered 2024-05-26: 25 ug via INTRAVENOUS
  Administered 2024-05-26: 50 ug via INTRAVENOUS
  Administered 2024-05-26: 25 ug via INTRAVENOUS

## 2024-05-26 MED ORDER — LIDOCAINE-EPINEPHRINE 1 %-1:100000 IJ SOLN
20.0000 mL | Freq: Once | INTRAMUSCULAR | Status: AC
  Administered 2024-05-26: 20 mL via INTRADERMAL

## 2024-05-26 MED ORDER — FENTANYL CITRATE (PF) 100 MCG/2ML IJ SOLN
INTRAMUSCULAR | Status: AC
Start: 2024-05-26 — End: 2024-05-26
  Filled 2024-05-26: qty 2

## 2024-05-26 NOTE — Discharge Instructions (Signed)
 Femoral Site Care The following information offers guidance on how to care for yourself after your procedure. Your health care provider may also give you more specific instructions. If you have problems or questions, contact your health care provider. What can I expect after the procedure? After the procedure, it is common to have bruising and tenderness at the incision site. This usually fades within 1-2 weeks. Follow these instructions at home: Incision site care  Follow instructions from your health care provider about how to take care of your incision site. Make sure you: Wash your hands with soap and water for at least 20 seconds before and after you change your bandage (dressing). If soap and water are not available, use hand sanitizer. Remove your dressing in 24 hours. Leave stitches (sutures), skin glue, or adhesive strips in place. These skin closures may need to stay in place for 2 weeks or longer. If adhesive strip edges start to loosen and curl up, you may trim the loose edges. Do not remove adhesive strips completely unless your health care provider tells you to do that. Do not take baths, swim, or use a hot tub for at least 1 week. You may shower 24 hours after the procedure or as told by your health care provider. Gently wash the incision site with plain soap and water. Pat the area dry with a clean towel. Do not rub the site. This may cause bleeding. Do not apply powder or lotion to the site. Keep the site clean and dry. Check your femoral site every day for signs of infection. Check for: Redness, swelling, or pain. Fluid or blood. Warmth. Pus or a bad smell. Activity If you were given a sedative during the procedure, it can affect you for several hours. Do not drive or operate machinery until your health care provider says that it is safe. Rest as told by your health care provider. Avoid sitting for a long time without moving. Get up to take short walks every 1-2 hours. This  is important to improve blood flow and breathing. Ask for help if you feel weak or unsteady. Return to your normal activities as told by your health care provider. Ask your health care provider what activities are safe for you and when you can return to work. Avoid activities that take a lot of effort for the first 2-3 days after your procedure, or as long as directed. Do not lift anything that is heavier than 10 lb (4.5 kg), or the limit that you are told, until your health care provider says that it is safe. General instructions Take over-the-counter and prescription medicines only as told by your health care provider. If you will be going home right after the procedure, plan to have a responsible adult care for you for the time you are told. This is important. Keep all follow-up visits. This is important. Contact a health care provider if: You have a fever or chills. You have any of these signs of infection at your incision site: Redness, swelling, or pain. Fluid or blood. Warmth. Pus or a bad smell. Get help right away if: The incision area swells very fast. The incision area is bleeding, and the bleeding does not stop when you hold steady pressure on the area. Your leg or foot becomes pale, cool, tingly, or numb. These symptoms may represent a serious problem that is an emergency. Do not wait to see if the symptoms will go away. Get medical help right away. Call your local emergency  services (911 in the [place] Do not drive yourself to the hospital. Summary After the procedure, it is common to have bruising and tenderness that fade within 1-2 weeks. Check your femoral site every day for signs of infection. Do not lift anything that is heavier than 10 lb (4.5 kg), or the limit that you are told, until your health care provider says that it is safe. Get help right away if the incision area swells very fast, you have bleeding at the incision area that does not stop, or your leg or foot  becomes pale, cool, or numb. This information is not intended to replace advice given to you by your health care provider. Make sure you discuss any questions you have with your health care provider. Document Revised: 07/04/2021 Document Reviewed: 12/03/2020 Elsevier Patient Education  2024 [employer].

## 2024-05-26 NOTE — Procedures (Signed)
 Interventional Radiology Procedure Note  Procedure: Prostate artery embolization   Findings: Please refer to procedural dictation for full description. Bilateral CFA 5 Fr access.  Successful right PAE with penacava coil embolization.  Aborted left PAE due to inability to isolate prostatic arterial supply.  Bilateral 5 Fr Mynx closure devices successfully deployed.  Complications: None immediate  Estimated Blood Loss: < 5 ml  Recommendations: Strict 4 hour bedrest, initial 2 hours flat. Keep indwelling Foley, follow up with Urology in 3-4 weeks for voiding trial. IR will arrange 1 month outpatient follow up.   Ester Sides, MD

## 2024-05-27 ENCOUNTER — Emergency Department (HOSPITAL_COMMUNITY)
Admission: EM | Admit: 2024-05-27 | Discharge: 2024-05-27 | Disposition: A | Discharge status: Home / Self Care | Attending: Emergency Medicine | Admitting: Emergency Medicine

## 2024-05-27 ENCOUNTER — Encounter (HOSPITAL_COMMUNITY): Payer: Self-pay

## 2024-05-27 ENCOUNTER — Other Ambulatory Visit: Payer: Self-pay

## 2024-05-27 DIAGNOSIS — E119 Type 2 diabetes mellitus without complications: Secondary | ICD-10-CM | POA: Insufficient documentation

## 2024-05-27 DIAGNOSIS — N3289 Other specified disorders of bladder: Secondary | ICD-10-CM | POA: Insufficient documentation

## 2024-05-27 DIAGNOSIS — Z8546 Personal history of malignant neoplasm of prostate: Secondary | ICD-10-CM | POA: Diagnosis not present

## 2024-05-27 DIAGNOSIS — Z7984 Long term (current) use of oral hypoglycemic drugs: Secondary | ICD-10-CM | POA: Insufficient documentation

## 2024-05-27 LAB — CBC WITH DIFFERENTIAL/PLATELET
Abs Immature Granulocytes: 0.04 K/uL (ref 0.00–0.07)
Basophils Absolute: 0 K/uL (ref 0.0–0.1)
Basophils Relative: 0 %
Eosinophils Absolute: 0.1 K/uL (ref 0.0–0.5)
Eosinophils Relative: 1 %
HCT: 43.8 % (ref 39.0–52.0)
Hemoglobin: 14.9 g/dL (ref 13.0–17.0)
Immature Granulocytes: 0 %
Lymphocytes Relative: 14 %
Lymphs Abs: 1.9 K/uL (ref 0.7–4.0)
MCH: 27.5 pg (ref 26.0–34.0)
MCHC: 34 g/dL (ref 30.0–36.0)
MCV: 80.8 fL (ref 80.0–100.0)
Monocytes Absolute: 1 K/uL (ref 0.1–1.0)
Monocytes Relative: 8 %
Neutro Abs: 9.9 K/uL — ABNORMAL HIGH (ref 1.7–7.7)
Neutrophils Relative %: 77 %
Platelets: 345 K/uL (ref 150–400)
RBC: 5.42 MIL/uL (ref 4.22–5.81)
RDW: 14.9 % (ref 11.5–15.5)
WBC: 13 K/uL — ABNORMAL HIGH (ref 4.0–10.5)
nRBC: 0 % (ref 0.0–0.2)

## 2024-05-27 LAB — COMPREHENSIVE METABOLIC PANEL WITH GFR
ALT: 38 U/L (ref 0–44)
AST: 32 U/L (ref 15–41)
Albumin: 4 g/dL (ref 3.5–5.0)
Alkaline Phosphatase: 70 U/L (ref 38–126)
Anion gap: 15 (ref 5–15)
BUN: 13 mg/dL (ref 8–23)
CO2: 20 mmol/L — ABNORMAL LOW (ref 22–32)
Calcium: 9.9 mg/dL (ref 8.9–10.3)
Chloride: 104 mmol/L (ref 98–111)
Creatinine, Ser: 1.32 mg/dL — ABNORMAL HIGH (ref 0.61–1.24)
GFR, Estimated: >60 mL/min (ref >60)
Glucose, Bld: 188 mg/dL — ABNORMAL HIGH (ref 70–99)
Potassium: 4 mmol/L (ref 3.5–5.1)
Sodium: 139 mmol/L (ref 135–145)
Total Bilirubin: 0.8 mg/dL (ref 0.0–1.2)
Total Protein: 7.8 g/dL (ref 6.5–8.1)

## 2024-05-27 LAB — URINALYSIS, W/ REFLEX TO CULTURE (INFECTION SUSPECTED)
Bilirubin Urine: NEGATIVE
Glucose, UA: NEGATIVE mg/dL
Ketones, ur: NEGATIVE mg/dL
Nitrite: POSITIVE — AB
Protein, ur: 30 mg/dL — AB
Specific Gravity, Urine: 1.013 (ref 1.005–1.030)
pH: 6 (ref 5.0–8.0)

## 2024-05-27 LAB — I-STAT CG4 LACTIC ACID, ED
Lactic Acid, Venous: 3.7 mmol/L (ref 0.5–1.9)
Lactic Acid, Venous: 2.3 mmol/L (ref 0.5–1.9)
Lactic Acid, Venous: 1.6 mmol/L (ref 0.5–1.9)

## 2024-05-27 LAB — PROTIME-INR
INR: 1.1 (ref 0.8–1.2)
Prothrombin Time: 14.4 s (ref 11.4–15.2)

## 2024-05-27 MED ORDER — MORPHINE SULFATE (PF) 4 MG/ML IV SOLN
4.0000 mg | Freq: Once | INTRAVENOUS | Status: AC
  Administered 2024-05-27: 4 mg via INTRAVENOUS
  Filled 2024-05-27: qty 1

## 2024-05-27 MED ORDER — OXYBUTYNIN CHLORIDE 5 MG PO TABS
5.0000 mg | ORAL_TABLET | Freq: Three times a day (TID) | ORAL | Status: DC
  Administered 2024-05-27: 5 mg via ORAL
  Filled 2024-05-27: qty 1

## 2024-05-27 MED ORDER — GEMTESA 75 MG PO TABS: 1.0000 | ORAL_TABLET | Freq: Every day | ORAL | 0 refills | Status: AC

## 2024-05-27 MED ORDER — SODIUM CHLORIDE 0.9 % IV BOLUS
1000.0000 mL | Freq: Once | INTRAVENOUS | Status: AC
  Administered 2024-05-27: 1000 mL via INTRAVENOUS

## 2024-05-27 NOTE — ED Provider Notes (Signed)
 [facility] AT [facility] Provider Note   CSN: [id number] Arrival date & time: 05/27/24  9346     Patient presents with: Post-op Problem   Henry Walker is a 63 y.o. male.  The patient is a 64 year old male with a past medical history of prostate cancer and type 2 diabetes appearance of the ED given concern for Foley catheter issue.  Patient underwent prostate artery embolization with IR (Dr.Suttle) yesterday.  Beginning last night he started to have pain at the site of his Foley catheter that was placed about a month ago.  Draining urine around the catheter and a burning pain over the distal urethra.  Catheter still draining clear yellow urine in the collection bag.  No fevers chills or bleeding from femoral artery access site.  Patient is currently on p.o. Cipro    HPI     Prior to Admission medications   Medication Sig Start Date End Date Taking? Authorizing Provider  Vibegron  (GEMTESA ) 75 MG TABS Take 1 tablet (75 mg total) by mouth daily. 05/27/24 06/26/24 Yes Pamella Ozell LABOR, DO  albuterol  (VENTOLIN  HFA) 108 (90 Base) MCG/ACT inhaler Inhale 2 puffs into the lungs every 6 (six) hours as needed for wheezing or shortness of breath. 03/18/23   Delbert Clam, MD  allopurinol  (ZYLOPRIM ) 100 MG tablet Take 1 tablet (100 mg total) by mouth daily. 04/27/24   Newlin, Enobong, MD  amLODipine  (NORVASC ) 10 MG tablet Take 1 tablet (10 mg total) by mouth daily. 04/27/24   Newlin, Enobong, MD  atorvastatin  (LIPITOR) 80 MG tablet Take 1 tablet (80 mg total) by mouth daily. 04/27/24   Newlin, Enobong, MD  Blood Glucose Monitoring Suppl (ONE TOUCH ULTRA 2) w/Device KIT Use as directed 3 times daily before meals. 07/06/19   Newlin, Enobong, MD  buPROPion  (WELLBUTRIN  XL) 150 MG 24 hr tablet Take 1 tablet (150 mg total) by mouth daily. For smoking cessation 04/27/24   Newlin, Enobong, MD  carvedilol  (COREG ) 12.5 MG tablet Take 1 tablet (12.5 mg total) by mouth 2 (two) times  daily with a meal. 04/27/24   Delbert Clam, MD  cetirizine  (ZYRTEC  ALLERGY) 10 MG tablet Take 1 tablet (10 mg total) by mouth daily. 01/06/23   Mayers, Cari S, PA-C  ciprofloxacin  (CIPRO ) 500 MG tablet Take 1 tablet (500 mg total) by mouth 2 (two) times daily. 09/18/23   Patrcia Cough, MD  ciprofloxacin  (CIPRO ) 500 MG tablet Take 1 tablet (500 mg total) by mouth 2 (two) times daily for 7 days. 05/26/24 06/02/24  Covington, Jamie R, NP  fluticasone  (FLONASE ) 50 MCG/ACT nasal spray Place 2 sprays into both nostrils daily. Patient taking differently: Place 2 sprays into both nostrils daily as needed for allergies. 01/06/23   Mayers, Cari S, PA-C  glucose blood (ONETOUCH ULTRA) test strip Use as instructed 3 times daily 07/06/19   Newlin, Enobong, MD  Lancet Devices [facility]) lancets Use as instructed 08/04/15   Oleta Berwyn LABOR, NP  Lancets [facility] ULTRASOFT) lancets Use as instructed 3 times daily 07/06/19   Newlin, Enobong, MD  metFORMIN  (GLUCOPHAGE -XR) 500 MG 24 hr tablet Take 1 tablet (500 mg total) by mouth daily with breakfast. 04/27/24   Delbert Clam, MD  oxyCODONE  (OXY IR/ROXICODONE ) 5 MG immediate release tablet Take 1-2 tablets (5-10 mg total) by mouth every 6 (six) hours as needed for severe pain (pain score 7-10). 09/18/23   Patrcia Cough, MD  pantoprazole  (PROTONIX ) 40 MG tablet Take 1 tablet (40 mg total) by  mouth daily. 04/27/24   Newlin, Enobong, MD  phenazopyridine  (PYRIDIUM ) 100 MG tablet Take 1 tablet (100 mg total) by mouth in the morning, at noon, and at bedtime for 7 days. 05/26/24 06/02/24  Covington, Jamie R, NP  phenazopyridine  (PYRIDIUM ) 200 MG tablet Take 200 mg by mouth 3 (three) times daily as needed for pain.    [provider]  potassium chloride  SA (KLOR-CON  M) 20 MEQ tablet Take 2 tablets (40 mEq total) by mouth daily. 09/15/23   Prosperi, Christian H, PA-C  solifenacin  (VESICARE ) 5 MG tablet Take 1 tablet (5 mg total) by mouth daily for 7 days. 05/26/24  06/02/24  Covington, Jamie R, NP  tamsulosin  (FLOMAX ) 0.4 MG CAPS capsule Take 1 capsule (0.4 mg total) by mouth daily after supper. 08/22/23   Patrcia Cough, MD  traZODone  (DESYREL ) 50 MG tablet Take 1 tablet (50 mg total) by mouth at bedtime. 04/27/24   Newlin, Enobong, MD  valsartan -hydrochlorothiazide  (DIOVAN -HCT) 320-25 MG tablet Take 1 tablet by mouth daily. 04/27/24   Newlin, Enobong, MD  tadalafil  (CIALIS ) 20 MG tablet Take 0.5-1 tablets (10-20 mg total) by mouth every other day as needed for erectile dysfunction. Patient not taking: Reported on 09/19/2014 07/21/14 07/17/15  Advani, Deepak, MD    Allergies: Patient has no known allergies.    Review of Systems  Updated Vital Signs BP (!) 151/68   Pulse (!) 51   Temp 98.3 F (36.8 C)   Resp 18   Ht 5' 7 (1.702 m)   Wt 74.8 kg   SpO2 97%   BMI 25.84 kg/m   Physical Exam Vitals and nursing note reviewed.  HENT:     Head: Normocephalic and atraumatic.  Eyes:     Pupils: Pupils are equal, round, and reactive to light.  Cardiovascular:     Rate and Rhythm: Normal rate and regular rhythm.  Pulmonary:     Effort: Pulmonary effort is normal.     Breath sounds: Normal breath sounds.  Abdominal:     Palpations: Abdomen is soft.     Tenderness: There is no abdominal tenderness.  Genitourinary:    Penis: Normal.      Comments: Foley catheter in place draining clear yellow urine in collection bag Skin:    General: Skin is warm and dry.     Comments: No hematoma or active bleeding from femoral access sites  Neurological:     Mental Status: He is alert.  Psychiatric:        Mood and Affect: Mood normal.     (all labs ordered are listed, but only abnormal results are displayed) Labs Reviewed  COMPREHENSIVE METABOLIC PANEL WITH GFR - Abnormal; Notable for the following components:      Result Value   CO2 20 (*)    Glucose, Bld 188 (*)    Creatinine, Ser 1.32 (*)    All other components within normal limits  CBC WITH  DIFFERENTIAL/PLATELET - Abnormal; Notable for the following components:   WBC 13.0 (*)    Neutro Abs 9.9 (*)    All other components within normal limits  URINALYSIS, W/ REFLEX TO CULTURE (INFECTION SUSPECTED) - Abnormal; Notable for the following components:   Color, Urine AMBER (*)    Hgb urine dipstick MODERATE (*)    Protein, ur 30 (*)    Nitrite POSITIVE (*)    Leukocytes,Ua LARGE (*)    Bacteria, UA FEW (*)    All other components within normal limits  I-STAT CG4 LACTIC  ACID, ED - Abnormal; Notable for the following components:   Lactic Acid, Venous 3.7 (*)    All other components within normal limits  I-STAT CG4 LACTIC ACID, ED - Abnormal; Notable for the following components:   Lactic Acid, Venous 2.3 (*)    All other components within normal limits  CULTURE, BLOOD (ROUTINE X 2)  CULTURE, BLOOD (ROUTINE X 2)  URINE CULTURE  PROTIME-INR  I-STAT CG4 LACTIC ACID, ED    EKG: None  Radiology: IR EMBO ARTERIAL NOT HEMORR HEMANG INC GUIDE ROADMAPPING Result Date: 05/26/2024 INDICATION: 63 year old male with a history of prostatic hyperplasia and prostate cancer status post external beam radiation therapy (November 2024) 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 He has failed multiple voiding trials and medical management. He is very frustrated and depressed with his current situation of having to have the Foley in place. EXAM: 1. Ultrasound-guided vascular access of the right common femoral artery. 2. Selective catheterization and angiography of the right external iliac, right internal iliac, and right prostatic arteries. 3. Cone beam CT. 4. Coil embolization of penile collateral artery. 5. Prostatic artery embolization. 6. Ultrasound-guided vascular access of the left common femoral artery. 7. Selective catheterization angiography of the left external iliac artery, left internal iliac artery, left internal pudendal artery, left  inferior rectal artery, left this acute low prostatic artery, and left vesicular artery. 8. Cone beam CT. MEDICATIONS: Ciprofloxacin  400 mg IV. The antibiotic was administered within 1 hour of the procedure ANESTHESIA/SEDATION: Moderate (conscious) sedation was employed during this procedure. A total of Versed  1.5 mg and Fentanyl  100 mcg was administered intravenously. Moderate Sedation Time: 125 minutes. The patient's level of consciousness and vital signs were monitored continuously by radiology nursing throughout the procedure under my direct supervision. CONTRAST:  50mL OMNIPAQUE  IOHEXOL  300 MG/ML SOLN, 13mL OMNIPAQUE  IOHEXOL  300 MG/ML SOLN FLUOROSCOPY: Radiation Exposure Index (as provided by the fluoroscopic device): 2,376 mGy Kerma COMPLICATIONS: None immediate. PROCEDURE: Informed consent was obtained from the patient following explanation of the procedure, risks, benefits and alternatives. The patient understands, agrees and consents for the procedure. All questions were addressed. A time out was performed prior to the initiation of the procedure. Maximal barrier sterile technique utilized including caps, mask, sterile gowns, sterile gloves, large sterile drape, hand hygiene, and Betadine prep. Preprocedure ultrasound evaluation of the right common femoral artery demonstrated patency. The procedure was planned. Subdermal Local anesthesia was administered at the planned needle entry site a small skin nick was made. Under direct ultrasound visualization, a 21 gauge micropuncture needle was directed into the right common femoral artery. A micropuncture sheath was then introduced exchanged for a 5 [place] vascular sheath over a J wire. A Kumpe the catheter was then introduced and angiogram was performed about the proximal right external iliac artery with reflux into the common and internal iliac arteries which demonstrated patency and conventional anatomic configuration. Using a Glidewire, the internal iliac  artery was then selected. Internal iliac angiogram demonstrated patency of the anterior posterior divisions with the prostatic artery appearing to arise from the proximal internal pudendal artery/anterior division trifurcation. The prostatic artery was then selected. Dedicated angiogram demonstrated opacification of the left hemi prostate with multiple distal collateral vessels in possible communication with the penile arteries. Therefore, the distal collateral branch was selected with a 2.4 [place] Swift ninja microcatheter and Aristotle 14 microwire. A total of 2, 1 mm detachable low-profile Ruby coils were then deployed to embolize the collateral vessels. The catheter was then  retracted to the proximal right prostatic artery and repeat angiogram demonstrated complete obliteration of communication with collateral branches supplying the penile artery. Two hundred mcg of nitroglycerin  were administered at this location followed by embolization with a dilute solution of 400 micron Hydropeals until complete embolization was achieved. The catheter was retracted in completion angiogram demonstrated complete embolization of the right hemi prostate. The catheters were then removed. A Mynx device was then used to achieve hemostasis about the right common femoral artery access site. A sterile bandage was applied. Attention was then turned toward the left hemi prostate. Preprocedure ultrasound evaluation demonstrated patency of the left common femoral artery. The procedure was planned. Subdermal Local anesthesia was administered at the planned needle entry site with 1% lidocaine . A small skin nick was made. Under direct ultrasound visualization, the left common femoral artery is accessed with a 21 gauge micropuncture needle. A permanent ultrasound image was captured and stored in the record. Micropuncture sheath was introduced through which a J wire was advanced to the external iliac artery. The micropuncture sheath was  exchanged for a 5 French vascular sheath. Through the sheath, a 5 [place] Kumpe the catheter was then directed to the level of the proximal external iliac artery and dedicated angiogram was performed with reflux into the distal common and internal iliac arteries demonstrated patency. Using a Glidewire, the 5 [place] catheter was then advanced into the anterior division of the internal iliac artery. Angiogram was performed which demonstrated multiple possible vascular supplies to the left hemi prostate. A branch from the proximal left internal pudendal artery was then selected. Dedicated angiogram demonstrated possible partial left hemi prostatic supply however this appeared to primarily represent supply to the inferior the catheter was then retracted slightly. The microcatheter was then used to select an additional tortuous artery arising from the trifurcation of the anterior division. Dedicated angiogram was performed at this location which demonstrated primarily left vesicular artery supply with a few possible collateral arteries to the prostate. Cone beam CT was performed at this location which confirmed primarily left vesicular artery supply with some collateral supplying the left hemi prostate, however too numerous for safe prostatic artery embolization. The catheter was retracted further still in a branch from the proximal internal pudendal artery was then selected. Dedicated angiogram was performed this location which demonstrated primarily left vesicular artery supply. At this point the procedure was terminated. The catheters were removed. The left common femoral artery access site was closed with a 5 French Mynx device. Peripheral pulses were unchanged. Sterile bandages were applied. The patient tolerated the procedure well and was transferred to the recovery area in good condition. IMPRESSION: 1. Technically successful right hemi prostatic artery embolization. 2. Technically unsuccessful left hemi prostatic  artery embolization due to irregular vascularity and multifocal collaterals with inability to isolate the true left prostatic artery. Ester Sides, MD [facility] Radiology Electronically Signed   By: Ester Sides M.D.   On: 05/26/2024 16:06   IR CT PELVIS W/CM Result Date: 05/26/2024 INDICATION: 63 year old male with a history of prostatic hyperplasia and prostate cancer status post external beam radiation therapy (November 2024) 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 He has failed multiple voiding trials and medical management. He is very frustrated and depressed with his current situation of having to have the Foley in place. EXAM: 1. Ultrasound-guided vascular access of the right common femoral artery. 2. Selective catheterization and angiography of the right external iliac, right internal iliac, and  right prostatic arteries. 3. Cone beam CT. 4. Coil embolization of penile collateral artery. 5. Prostatic artery embolization. 6. Ultrasound-guided vascular access of the left common femoral artery. 7. Selective catheterization angiography of the left external iliac artery, left internal iliac artery, left internal pudendal artery, left inferior rectal artery, left this acute low prostatic artery, and left vesicular artery. 8. Cone beam CT. MEDICATIONS: Ciprofloxacin  400 mg IV. The antibiotic was administered within 1 hour of the procedure ANESTHESIA/SEDATION: Moderate (conscious) sedation was employed during this procedure. A total of Versed  1.5 mg and Fentanyl  100 mcg was administered intravenously. Moderate Sedation Time: 125 minutes. The patient's level of consciousness and vital signs were monitored continuously by radiology nursing throughout the procedure under my direct supervision. CONTRAST:  50mL OMNIPAQUE  IOHEXOL  300 MG/ML SOLN, 13mL OMNIPAQUE  IOHEXOL  300 MG/ML SOLN FLUOROSCOPY: Radiation Exposure  Index (as provided by the fluoroscopic device): 2,376 mGy Kerma COMPLICATIONS: None immediate. PROCEDURE: Informed consent was obtained from the patient following explanation of the procedure, risks, benefits and alternatives. The patient understands, agrees and consents for the procedure. All questions were addressed. A time out was performed prior to the initiation of the procedure. Maximal barrier sterile technique utilized including caps, mask, sterile gowns, sterile gloves, large sterile drape, hand hygiene, and Betadine prep. Preprocedure ultrasound evaluation of the right common femoral artery demonstrated patency. The procedure was planned. Subdermal Local anesthesia was administered at the planned needle entry site a small skin nick was made. Under direct ultrasound visualization, a 21 gauge micropuncture needle was directed into the right common femoral artery. A micropuncture sheath was then introduced exchanged for a 5 [place] vascular sheath over a J wire. A Kumpe the catheter was then introduced and angiogram was performed about the proximal right external iliac artery with reflux into the common and internal iliac arteries which demonstrated patency and conventional anatomic configuration. Using a Glidewire, the internal iliac artery was then selected. Internal iliac angiogram demonstrated patency of the anterior posterior divisions with the prostatic artery appearing to arise from the proximal internal pudendal artery/anterior division trifurcation. The prostatic artery was then selected. Dedicated angiogram demonstrated opacification of the left hemi prostate with multiple distal collateral vessels in possible communication with the penile arteries. Therefore, the distal collateral branch was selected with a 2.4 [place] Swift ninja microcatheter and Aristotle 14 microwire. A total of 2, 1 mm detachable low-profile Ruby coils were then deployed to embolize the collateral vessels. The catheter was then  retracted to the proximal right prostatic artery and repeat angiogram demonstrated complete obliteration of communication with collateral branches supplying the penile artery. Two hundred mcg of nitroglycerin  were administered at this location followed by embolization with a dilute solution of 400 micron Hydropeals until complete embolization was achieved. The catheter was retracted in completion angiogram demonstrated complete embolization of the right hemi prostate. The catheters were then removed. A Mynx device was then used to achieve hemostasis about the right common femoral artery access site. A sterile bandage was applied. Attention was then turned toward the left hemi prostate. Preprocedure ultrasound evaluation demonstrated patency of the left common femoral artery. The procedure was planned. Subdermal Local anesthesia was administered at the planned needle entry site with 1% lidocaine . A small skin nick was made. Under direct ultrasound visualization, the left common femoral artery is accessed with a 21 gauge micropuncture needle. A permanent ultrasound image was captured and stored in the record. Micropuncture sheath was introduced through which a J wire was advanced to the external iliac  artery. The micropuncture sheath was exchanged for a 5 French vascular sheath. Through the sheath, a 5 [place] Kumpe the catheter was then directed to the level of the proximal external iliac artery and dedicated angiogram was performed with reflux into the distal common and internal iliac arteries demonstrated patency. Using a Glidewire, the 5 [place] catheter was then advanced into the anterior division of the internal iliac artery. Angiogram was performed which demonstrated multiple possible vascular supplies to the left hemi prostate. A branch from the proximal left internal pudendal artery was then selected. Dedicated angiogram demonstrated possible partial left hemi prostatic supply however this appeared to primarily  represent supply to the inferior the catheter was then retracted slightly. The microcatheter was then used to select an additional tortuous artery arising from the trifurcation of the anterior division. Dedicated angiogram was performed at this location which demonstrated primarily left vesicular artery supply with a few possible collateral arteries to the prostate. Cone beam CT was performed at this location which confirmed primarily left vesicular artery supply with some collateral supplying the left hemi prostate, however too numerous for safe prostatic artery embolization. The catheter was retracted further still in a branch from the proximal internal pudendal artery was then selected. Dedicated angiogram was performed this location which demonstrated primarily left vesicular artery supply. At this point the procedure was terminated. The catheters were removed. The left common femoral artery access site was closed with a 5 French Mynx device. Peripheral pulses were unchanged. Sterile bandages were applied. The patient tolerated the procedure well and was transferred to the recovery area in good condition. IMPRESSION: 1. Technically successful right hemi prostatic artery embolization. 2. Technically unsuccessful left hemi prostatic artery embolization due to irregular vascularity and multifocal collaterals with inability to isolate the true left prostatic artery. Ester Sides, MD [facility] Radiology Electronically Signed   By: Ester Sides M.D.   On: 05/26/2024 16:06   IR CT PELVIS W/CM Result Date: 05/26/2024 INDICATION: 63 year old male with a history of prostatic hyperplasia and prostate cancer status post external beam radiation therapy (November 2024) 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 He has failed multiple voiding trials and medical management. He is very frustrated and depressed with his current  situation of having to have the Foley in place. EXAM: 1. Ultrasound-guided vascular access of the right common femoral artery. 2. Selective catheterization and angiography of the right external iliac, right internal iliac, and right prostatic arteries. 3. Cone beam CT. 4. Coil embolization of penile collateral artery. 5. Prostatic artery embolization. 6. Ultrasound-guided vascular access of the left common femoral artery. 7. Selective catheterization angiography of the left external iliac artery, left internal iliac artery, left internal pudendal artery, left inferior rectal artery, left this acute low prostatic artery, and left vesicular artery. 8. Cone beam CT. MEDICATIONS: Ciprofloxacin  400 mg IV. The antibiotic was administered within 1 hour of the procedure ANESTHESIA/SEDATION: Moderate (conscious) sedation was employed during this procedure. A total of Versed  1.5 mg and Fentanyl  100 mcg was administered intravenously. Moderate Sedation Time: 125 minutes. The patient's level of consciousness and vital signs were monitored continuously by radiology nursing throughout the procedure under my direct supervision. CONTRAST:  50mL OMNIPAQUE  IOHEXOL  300 MG/ML SOLN, 13mL OMNIPAQUE  IOHEXOL  300 MG/ML SOLN FLUOROSCOPY: Radiation Exposure Index (as provided by the fluoroscopic device): 2,376 mGy Kerma COMPLICATIONS: None immediate. PROCEDURE: Informed consent was obtained from the patient following explanation of the procedure, risks, benefits and alternatives. The patient understands,  agrees and consents for the procedure. All questions were addressed. A time out was performed prior to the initiation of the procedure. Maximal barrier sterile technique utilized including caps, mask, sterile gowns, sterile gloves, large sterile drape, hand hygiene, and Betadine prep. Preprocedure ultrasound evaluation of the right common femoral artery demonstrated patency. The procedure was planned. Subdermal Local anesthesia was  administered at the planned needle entry site a small skin nick was made. Under direct ultrasound visualization, a 21 gauge micropuncture needle was directed into the right common femoral artery. A micropuncture sheath was then introduced exchanged for a 5 [place] vascular sheath over a J wire. A Kumpe the catheter was then introduced and angiogram was performed about the proximal right external iliac artery with reflux into the common and internal iliac arteries which demonstrated patency and conventional anatomic configuration. Using a Glidewire, the internal iliac artery was then selected. Internal iliac angiogram demonstrated patency of the anterior posterior divisions with the prostatic artery appearing to arise from the proximal internal pudendal artery/anterior division trifurcation. The prostatic artery was then selected. Dedicated angiogram demonstrated opacification of the left hemi prostate with multiple distal collateral vessels in possible communication with the penile arteries. Therefore, the distal collateral branch was selected with a 2.4 [place] Swift ninja microcatheter and Aristotle 14 microwire. A total of 2, 1 mm detachable low-profile Ruby coils were then deployed to embolize the collateral vessels. The catheter was then retracted to the proximal right prostatic artery and repeat angiogram demonstrated complete obliteration of communication with collateral branches supplying the penile artery. Two hundred mcg of nitroglycerin  were administered at this location followed by embolization with a dilute solution of 400 micron Hydropeals until complete embolization was achieved. The catheter was retracted in completion angiogram demonstrated complete embolization of the right hemi prostate. The catheters were then removed. A Mynx device was then used to achieve hemostasis about the right common femoral artery access site. A sterile bandage was applied. Attention was then turned toward the left hemi  prostate. Preprocedure ultrasound evaluation demonstrated patency of the left common femoral artery. The procedure was planned. Subdermal Local anesthesia was administered at the planned needle entry site with 1% lidocaine . A small skin nick was made. Under direct ultrasound visualization, the left common femoral artery is accessed with a 21 gauge micropuncture needle. A permanent ultrasound image was captured and stored in the record. Micropuncture sheath was introduced through which a J wire was advanced to the external iliac artery. The micropuncture sheath was exchanged for a 5 French vascular sheath. Through the sheath, a 5 [place] Kumpe the catheter was then directed to the level of the proximal external iliac artery and dedicated angiogram was performed with reflux into the distal common and internal iliac arteries demonstrated patency. Using a Glidewire, the 5 [place] catheter was then advanced into the anterior division of the internal iliac artery. Angiogram was performed which demonstrated multiple possible vascular supplies to the left hemi prostate. A branch from the proximal left internal pudendal artery was then selected. Dedicated angiogram demonstrated possible partial left hemi prostatic supply however this appeared to primarily represent supply to the inferior the catheter was then retracted slightly. The microcatheter was then used to select an additional tortuous artery arising from the trifurcation of the anterior division. Dedicated angiogram was performed at this location which demonstrated primarily left vesicular artery supply with a few possible collateral arteries to the prostate. Cone beam CT was performed at this location which confirmed primarily left vesicular artery supply  with some collateral supplying the left hemi prostate, however too numerous for safe prostatic artery embolization. The catheter was retracted further still in a branch from the proximal internal pudendal artery was  then selected. Dedicated angiogram was performed this location which demonstrated primarily left vesicular artery supply. At this point the procedure was terminated. The catheters were removed. The left common femoral artery access site was closed with a 5 French Mynx device. Peripheral pulses were unchanged. Sterile bandages were applied. The patient tolerated the procedure well and was transferred to the recovery area in good condition. IMPRESSION: 1. Technically successful right hemi prostatic artery embolization. 2. Technically unsuccessful left hemi prostatic artery embolization due to irregular vascularity and multifocal collaterals with inability to isolate the true left prostatic artery. Ester Sides, MD [facility] Radiology Electronically Signed   By: Ester Sides M.D.   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 He has failed multiple voiding trials and medical management. He is very frustrated and depressed with his current situation of having to have the Foley in place. EXAM: 1. Ultrasound-guided vascular access of the right common femoral artery. 2. Selective catheterization and angiography of the right external iliac, right internal iliac, and right prostatic arteries. 3. Cone beam CT. 4. Coil embolization of penile collateral artery. 5. Prostatic artery embolization. 6. Ultrasound-guided vascular access of the left common femoral artery. 7. Selective catheterization angiography of the left external iliac artery, left internal iliac artery, left internal pudendal artery, left inferior rectal artery, left this acute low prostatic artery, and left vesicular artery. 8. Cone beam CT.  MEDICATIONS: Ciprofloxacin  400 mg IV. The antibiotic was administered within 1 hour of the procedure ANESTHESIA/SEDATION: Moderate (conscious) sedation was employed during this procedure. A total of Versed  1.5 mg and Fentanyl  100 mcg was administered intravenously. Moderate Sedation Time: 125 minutes. The patient's level of consciousness and vital signs were monitored continuously by radiology nursing throughout the procedure under my direct supervision. CONTRAST:  50mL OMNIPAQUE  IOHEXOL  300 MG/ML SOLN, 13mL OMNIPAQUE  IOHEXOL  300 MG/ML SOLN FLUOROSCOPY: Radiation Exposure Index (as provided by the fluoroscopic device): 2,376 mGy Kerma COMPLICATIONS: None immediate. PROCEDURE: Informed consent was obtained from the patient following explanation of the procedure, risks, benefits and alternatives. The patient understands, agrees and consents for the procedure. All questions were addressed. A time out was performed prior to the initiation of the procedure. Maximal barrier sterile technique utilized including caps, mask, sterile gowns, sterile gloves, large sterile drape, hand hygiene, and Betadine prep. Preprocedure ultrasound evaluation of the right common femoral artery demonstrated patency. The procedure was planned. Subdermal Local anesthesia was administered at the planned needle entry site a small skin nick was made. Under direct ultrasound visualization, a 21 gauge micropuncture needle was directed into the right common femoral artery. A micropuncture sheath was then introduced exchanged for a 5 [place] vascular sheath over a J wire. A Kumpe the catheter was then introduced and angiogram was performed about the proximal right external iliac artery with reflux into the common and internal iliac arteries which demonstrated patency and conventional anatomic configuration. Using a Glidewire, the internal iliac artery was then selected. Internal iliac angiogram demonstrated patency of the anterior posterior divisions  with the prostatic artery appearing to arise from the proximal internal pudendal  artery/anterior division trifurcation. The prostatic artery was then selected. Dedicated angiogram demonstrated opacification of the left hemi prostate with multiple distal collateral vessels in possible communication with the penile arteries. Therefore, the distal collateral branch was selected with a 2.4 [place] Swift ninja microcatheter and Aristotle 14 microwire. A total of 2, 1 mm detachable low-profile Ruby coils were then deployed to embolize the collateral vessels. The catheter was then retracted to the proximal right prostatic artery and repeat angiogram demonstrated complete obliteration of communication with collateral branches supplying the penile artery. Two hundred mcg of nitroglycerin  were administered at this location followed by embolization with a dilute solution of 400 micron Hydropeals until complete embolization was achieved. The catheter was retracted in completion angiogram demonstrated complete embolization of the right hemi prostate. The catheters were then removed. A Mynx device was then used to achieve hemostasis about the right common femoral artery access site. A sterile bandage was applied. Attention was then turned toward the left hemi prostate. Preprocedure ultrasound evaluation demonstrated patency of the left common femoral artery. The procedure was planned. Subdermal Local anesthesia was administered at the planned needle entry site with 1% lidocaine . A small skin nick was made. Under direct ultrasound visualization, the left common femoral artery is accessed with a 21 gauge micropuncture needle. A permanent ultrasound image was captured and stored in the record. Micropuncture sheath was introduced through which a J wire was advanced to the external iliac artery. The micropuncture sheath was exchanged for a 5 French vascular sheath. Through the sheath, a 5 [place] Kumpe the catheter was then directed to  the level of the proximal external iliac artery and dedicated angiogram was performed with reflux into the distal common and internal iliac arteries demonstrated patency. Using a Glidewire, the 5 [place] catheter was then advanced into the anterior division of the internal iliac artery. Angiogram was performed which demonstrated multiple possible vascular supplies to the left hemi prostate. A branch from the proximal left internal pudendal artery was then selected. Dedicated angiogram demonstrated possible partial left hemi prostatic supply however this appeared to primarily represent supply to the inferior the catheter was then retracted slightly. The microcatheter was then used to select an additional tortuous artery arising from the trifurcation of the anterior division. Dedicated angiogram was performed at this location which demonstrated primarily left vesicular artery supply with a few possible collateral arteries to the prostate. Cone beam CT was performed at this location which confirmed primarily left vesicular artery supply with some collateral supplying the left hemi prostate, however too numerous for safe prostatic artery embolization. The catheter was retracted further still in a branch from the proximal internal pudendal artery was then selected. Dedicated angiogram was performed this location which demonstrated primarily left vesicular artery supply. At this point the procedure was terminated. The catheters were removed. The left common femoral artery access site was closed with a 5 French Mynx device. Peripheral pulses were unchanged. Sterile bandages were applied. The patient tolerated the procedure well and was transferred to the recovery area in good condition. IMPRESSION: 1. Technically successful right hemi prostatic artery embolization. 2. Technically unsuccessful left hemi prostatic artery embolization due to irregular vascularity and multifocal collaterals with inability to isolate the true  left prostatic artery. Ester Sides, MD [facility] Radiology Electronically Signed   By: Ester Sides M.D.   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 He has failed multiple voiding trials and medical management. He is very frustrated and depressed with his current situation of having to have the Foley in place. EXAM: 1. Ultrasound-guided vascular access of the right common femoral artery. 2. Selective catheterization and angiography of the right external iliac, right internal iliac, and right prostatic arteries. 3. Cone beam CT. 4. Coil embolization of penile collateral artery. 5. Prostatic artery embolization. 6. Ultrasound-guided vascular access of the left common femoral artery. 7. Selective catheterization angiography of the left external iliac artery, left internal iliac artery, left internal pudendal artery, left inferior rectal artery, left this acute low prostatic artery, and left vesicular artery. 8. Cone beam CT. MEDICATIONS: Ciprofloxacin  400 mg IV. The antibiotic was administered within 1 hour of the procedure ANESTHESIA/SEDATION: Moderate (conscious) sedation was employed during this procedure. A total of Versed  1.5 mg and Fentanyl  100 mcg was administered intravenously. Moderate Sedation Time: 125 minutes. The patient's level of consciousness and vital signs were monitored continuously by radiology nursing throughout the procedure under my direct supervision. CONTRAST:  50mL OMNIPAQUE  IOHEXOL  300 MG/ML SOLN, 13mL OMNIPAQUE  IOHEXOL  300 MG/ML SOLN FLUOROSCOPY: Radiation Exposure Index (as provided by the fluoroscopic device): 2,376 mGy Kerma COMPLICATIONS: None immediate.  PROCEDURE: Informed consent was obtained from the patient following explanation of the procedure, risks, benefits and alternatives. The patient understands, agrees and consents for the procedure. All questions were addressed. A time out was performed prior to the initiation of the procedure. Maximal barrier sterile technique utilized including caps, mask, sterile gowns, sterile gloves, large sterile drape, hand hygiene, and Betadine prep. Preprocedure ultrasound evaluation of the right common femoral artery demonstrated patency. The procedure was planned. Subdermal Local anesthesia was administered at the planned needle entry site a small skin nick was made. Under direct ultrasound visualization, a 21 gauge micropuncture needle was directed into the right common femoral artery. A micropuncture sheath was then introduced exchanged for a 5 [place] vascular sheath over a J wire. A Kumpe the catheter was then introduced and angiogram was performed about the proximal right external iliac artery with reflux into the common and internal iliac arteries which demonstrated patency and conventional anatomic configuration. Using a Glidewire, the internal iliac artery was then selected. Internal iliac angiogram demonstrated patency of the anterior posterior divisions with the prostatic artery appearing to arise from the proximal internal pudendal artery/anterior division trifurcation. The prostatic artery was then selected. Dedicated angiogram demonstrated opacification of the left hemi prostate with multiple distal collateral vessels in possible communication with the penile arteries. Therefore, the distal collateral branch was selected with a 2.4 [place] Swift ninja microcatheter and Aristotle 14 microwire. A total of 2, 1 mm detachable low-profile Ruby coils were then deployed to embolize the collateral vessels. The catheter was then retracted to the proximal right prostatic artery and repeat angiogram demonstrated complete  obliteration of communication with collateral branches supplying the penile artery. Two hundred mcg of nitroglycerin  were administered at this location followed by embolization with a dilute solution of 400 micron Hydropeals until complete embolization was achieved. The catheter was retracted in completion angiogram demonstrated complete embolization of the right hemi prostate. The catheters were then removed. A Mynx device was then used to achieve hemostasis about the right common femoral artery access site. A sterile bandage was applied. Attention was then turned toward the left hemi prostate. Preprocedure ultrasound evaluation demonstrated patency of  the left common femoral artery. The procedure was planned. Subdermal Local anesthesia was administered at the planned needle entry site with 1% lidocaine . A small skin nick was made. Under direct ultrasound visualization, the left common femoral artery is accessed with a 21 gauge micropuncture needle. A permanent ultrasound image was captured and stored in the record. Micropuncture sheath was introduced through which a J wire was advanced to the external iliac artery. The micropuncture sheath was exchanged for a 5 French vascular sheath. Through the sheath, a 5 [place] Kumpe the catheter was then directed to the level of the proximal external iliac artery and dedicated angiogram was performed with reflux into the distal common and internal iliac arteries demonstrated patency. Using a Glidewire, the 5 [place] catheter was then advanced into the anterior division of the internal iliac artery. Angiogram was performed which demonstrated multiple possible vascular supplies to the left hemi prostate. A branch from the proximal left internal pudendal artery was then selected. Dedicated angiogram demonstrated possible partial left hemi prostatic supply however this appeared to primarily represent supply to the inferior the catheter was then retracted slightly. The  microcatheter was then used to select an additional tortuous artery arising from the trifurcation of the anterior division. Dedicated angiogram was performed at this location which demonstrated primarily left vesicular artery supply with a few possible collateral arteries to the prostate. Cone beam CT was performed at this location which confirmed primarily left vesicular artery supply with some collateral supplying the left hemi prostate, however too numerous for safe prostatic artery embolization. The catheter was retracted further still in a branch from the proximal internal pudendal artery was then selected. Dedicated angiogram was performed this location which demonstrated primarily left vesicular artery supply. At this point the procedure was terminated. The catheters were removed. The left common femoral artery access site was closed with a 5 French Mynx device. Peripheral pulses were unchanged. Sterile bandages were applied. The patient tolerated the procedure well and was transferred to the recovery area in good condition. IMPRESSION: 1. Technically successful right hemi prostatic artery embolization. 2. Technically unsuccessful left hemi prostatic artery embolization due to irregular vascularity and multifocal collaterals with inability to isolate the true left prostatic artery. Ester Sides, MD [facility] Radiology Electronically Signed   By: Ester Sides M.D.   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 He has failed multiple voiding trials and medical management. He is very frustrated and depressed with his current situation of having to have the Foley in place.  EXAM: 1. Ultrasound-guided vascular access of the right common femoral artery. 2. Selective catheterization and angiography of the right external iliac, right internal iliac, and right prostatic arteries. 3. Cone beam CT. 4. Coil embolization of penile collateral artery. 5. Prostatic artery embolization. 6. Ultrasound-guided vascular access of the left common femoral artery. 7. Selective catheterization angiography of the left external iliac artery, left internal iliac artery, left internal pudendal artery, left inferior rectal artery, left this acute low prostatic artery, and left vesicular artery. 8. Cone beam CT. MEDICATIONS: Ciprofloxacin  400 mg IV. The antibiotic was administered within 1 hour of the procedure ANESTHESIA/SEDATION: Moderate (conscious) sedation was employed during this procedure. A total of Versed  1.5 mg and Fentanyl  100 mcg was administered intravenously. Moderate Sedation  Time: 125 minutes. The patient's level of consciousness and vital signs were monitored continuously by radiology nursing throughout the procedure under my direct supervision. CONTRAST:  50mL OMNIPAQUE  IOHEXOL  300 MG/ML SOLN, 13mL OMNIPAQUE  IOHEXOL  300 MG/ML SOLN FLUOROSCOPY: Radiation Exposure Index (as provided by the fluoroscopic device): 2,376 mGy Kerma COMPLICATIONS: None immediate. PROCEDURE: Informed consent was obtained from the patient following explanation of the procedure, risks, benefits and alternatives. The patient understands, agrees and consents for the procedure. All questions were addressed. A time out was performed prior to the initiation of the procedure. Maximal barrier sterile technique utilized including caps, mask, sterile gowns, sterile gloves, large sterile drape, hand hygiene, and Betadine prep. Preprocedure ultrasound evaluation of the right common femoral artery demonstrated patency. The procedure was planned. Subdermal Local anesthesia was administered at the planned needle entry site a small skin  nick was made. Under direct ultrasound visualization, a 21 gauge micropuncture needle was directed into the right common femoral artery. A micropuncture sheath was then introduced exchanged for a 5 [place] vascular sheath over a J wire. A Kumpe the catheter was then introduced and angiogram was performed about the proximal right external iliac artery with reflux into the common and internal iliac arteries which demonstrated patency and conventional anatomic configuration. Using a Glidewire, the internal iliac artery was then selected. Internal iliac angiogram demonstrated patency of the anterior posterior divisions with the prostatic artery appearing to arise from the proximal internal pudendal artery/anterior division trifurcation. The prostatic artery was then selected. Dedicated angiogram demonstrated opacification of the left hemi prostate with multiple distal collateral vessels in possible communication with the penile arteries. Therefore, the distal collateral branch was selected with a 2.4 [place] Swift ninja microcatheter and Aristotle 14 microwire. A total of 2, 1 mm detachable low-profile Ruby coils were then deployed to embolize the collateral vessels. The catheter was then retracted to the proximal right prostatic artery and repeat angiogram demonstrated complete obliteration of communication with collateral branches supplying the penile artery. Two hundred mcg of nitroglycerin  were administered at this location followed by embolization with a dilute solution of 400 micron Hydropeals until complete embolization was achieved. The catheter was retracted in completion angiogram demonstrated complete embolization of the right hemi prostate. The catheters were then removed. A Mynx device was then used to achieve hemostasis about the right common femoral artery access site. A sterile bandage was applied. Attention was then turned toward the left hemi prostate. Preprocedure ultrasound evaluation demonstrated  patency of the left common femoral artery. The procedure was planned. Subdermal Local anesthesia was administered at the planned needle entry site with 1% lidocaine . A small skin nick was made. Under direct ultrasound visualization, the left common femoral artery is accessed with a 21 gauge micropuncture needle. A permanent ultrasound image was captured and stored in the record. Micropuncture sheath was introduced through which a J wire was advanced to the external iliac artery. The micropuncture sheath was exchanged for a 5 French vascular sheath. Through the sheath, a 5 [place] Kumpe the catheter was then directed to the level of the proximal external iliac artery and dedicated angiogram was performed with reflux into the distal common and internal iliac arteries demonstrated patency. Using a Glidewire, the 5 [place] catheter was then advanced into the anterior division of the internal iliac artery. Angiogram was performed which demonstrated multiple possible vascular supplies to the left hemi prostate. A branch from the proximal left internal pudendal artery was then selected. Dedicated angiogram demonstrated possible partial left hemi prostatic  supply however this appeared to primarily represent supply to the inferior the catheter was then retracted slightly. The microcatheter was then used to select an additional tortuous artery arising from the trifurcation of the anterior division. Dedicated angiogram was performed at this location which demonstrated primarily left vesicular artery supply with a few possible collateral arteries to the prostate. Cone beam CT was performed at this location which confirmed primarily left vesicular artery supply with some collateral supplying the left hemi prostate, however too numerous for safe prostatic artery embolization. The catheter was retracted further still in a branch from the proximal internal pudendal artery was then selected. Dedicated angiogram was performed this  location which demonstrated primarily left vesicular artery supply. At this point the procedure was terminated. The catheters were removed. The left common femoral artery access site was closed with a 5 French Mynx device. Peripheral pulses were unchanged. Sterile bandages were applied. The patient tolerated the procedure well and was transferred to the recovery area in good condition. IMPRESSION: 1. Technically successful right hemi prostatic artery embolization. 2. Technically unsuccessful left hemi prostatic artery embolization due to irregular vascularity and multifocal collaterals with inability to isolate the true left prostatic artery. Ester Sides, MD [facility] Radiology Electronically Signed   By: Ester Sides M.D.   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 He has failed multiple voiding trials and medical management. He is very frustrated and depressed with his current situation of having to have the Foley in place. EXAM: 1. Ultrasound-guided vascular access of the right common femoral artery. 2. Selective catheterization and angiography of the right external iliac, right internal iliac, and right prostatic arteries. 3. Cone beam CT. 4. Coil embolization of penile collateral artery. 5. Prostatic artery embolization. 6. Ultrasound-guided vascular access of the left common femoral artery. 7. Selective catheterization angiography of the left external iliac artery, left internal iliac artery, left internal pudendal artery, left inferior rectal artery, left this acute low prostatic artery, and left vesicular artery. 8. Cone beam CT. MEDICATIONS: Ciprofloxacin   400 mg IV. The antibiotic was administered within 1 hour of the procedure ANESTHESIA/SEDATION: Moderate (conscious) sedation was employed during this procedure. A total of Versed  1.5 mg and Fentanyl  100 mcg was administered intravenously. Moderate Sedation Time: 125 minutes. The patient's level of consciousness and vital signs were monitored continuously by radiology nursing throughout the procedure under my direct supervision. CONTRAST:  50mL OMNIPAQUE  IOHEXOL  300 MG/ML SOLN, 13mL OMNIPAQUE  IOHEXOL  300 MG/ML SOLN FLUOROSCOPY: Radiation Exposure Index (as provided by the fluoroscopic device): 2,376 mGy Kerma COMPLICATIONS: None immediate. PROCEDURE: Informed consent was obtained from the patient following explanation of the procedure, risks, benefits and alternatives. The patient understands, agrees and consents for the procedure. All questions were addressed. A time out was performed prior to the initiation of the procedure. Maximal barrier sterile technique utilized including caps, mask, sterile gowns, sterile gloves, large sterile drape, hand hygiene, and Betadine prep. Preprocedure ultrasound evaluation of the right common femoral artery demonstrated patency. The procedure was planned. Subdermal Local anesthesia was administered at the planned needle entry site a small skin nick was made. Under direct ultrasound visualization, a 21 gauge micropuncture needle was directed into the right common femoral artery. A micropuncture sheath was then introduced exchanged  for a 5 [place] vascular sheath over a J wire. A Kumpe the catheter was then introduced and angiogram was performed about the proximal right external iliac artery with reflux into the common and internal iliac arteries which demonstrated patency and conventional anatomic configuration. Using a Glidewire, the internal iliac artery was then selected. Internal iliac angiogram demonstrated patency of the anterior posterior divisions with the prostatic artery  appearing to arise from the proximal internal pudendal artery/anterior division trifurcation. The prostatic artery was then selected. Dedicated angiogram demonstrated opacification of the left hemi prostate with multiple distal collateral vessels in possible communication with the penile arteries. Therefore, the distal collateral branch was selected with a 2.4 [place] Swift ninja microcatheter and Aristotle 14 microwire. A total of 2, 1 mm detachable low-profile Ruby coils were then deployed to embolize the collateral vessels. The catheter was then retracted to the proximal right prostatic artery and repeat angiogram demonstrated complete obliteration of communication with collateral branches supplying the penile artery. Two hundred mcg of nitroglycerin  were administered at this location followed by embolization with a dilute solution of 400 micron Hydropeals until complete embolization was achieved. The catheter was retracted in completion angiogram demonstrated complete embolization of the right hemi prostate. The catheters were then removed. A Mynx device was then used to achieve hemostasis about the right common femoral artery access site. A sterile bandage was applied. Attention was then turned toward the left hemi prostate. Preprocedure ultrasound evaluation demonstrated patency of the left common femoral artery. The procedure was planned. Subdermal Local anesthesia was administered at the planned needle entry site with 1% lidocaine . A small skin nick was made. Under direct ultrasound visualization, the left common femoral artery is accessed with a 21 gauge micropuncture needle. A permanent ultrasound image was captured and stored in the record. Micropuncture sheath was introduced through which a J wire was advanced to the external iliac artery. The micropuncture sheath was exchanged for a 5 French vascular sheath. Through the sheath, a 5 [place] Kumpe the catheter was then directed to the level of the proximal  external iliac artery and dedicated angiogram was performed with reflux into the distal common and internal iliac arteries demonstrated patency. Using a Glidewire, the 5 [place] catheter was then advanced into the anterior division of the internal iliac artery. Angiogram was performed which demonstrated multiple possible vascular supplies to the left hemi prostate. A branch from the proximal left internal pudendal artery was then selected. Dedicated angiogram demonstrated possible partial left hemi prostatic supply however this appeared to primarily represent supply to the inferior the catheter was then retracted slightly. The microcatheter was then used to select an additional tortuous artery arising from the trifurcation of the anterior division. Dedicated angiogram was performed at this location which demonstrated primarily left vesicular artery supply with a few possible collateral arteries to the prostate. Cone beam CT was performed at this location which confirmed primarily left vesicular artery supply with some collateral supplying the left hemi prostate, however too numerous for safe prostatic artery embolization. The catheter was retracted further still in a branch from the proximal internal pudendal artery was then selected. Dedicated angiogram was performed this location which demonstrated primarily left vesicular artery supply. At this point the procedure was terminated. The catheters were removed. The left common femoral artery access site was closed with a 5 French Mynx device. Peripheral pulses were unchanged. Sterile bandages were applied. The patient tolerated the procedure well and was transferred to the recovery area in good condition. IMPRESSION:  1. Technically successful right hemi prostatic artery embolization. 2. Technically unsuccessful left hemi prostatic artery embolization due to irregular vascularity and multifocal collaterals with inability to isolate the true left prostatic artery. Ester Sides, MD [facility] Radiology Electronically Signed   By: Ester Sides M.D.   On: 05/26/2024 16:06     Procedures   Medications Ordered in the ED  oxybutynin  (DITROPAN ) tablet 5 mg (5 mg Oral Given 05/27/24 1021)  morphine  (PF) 4 MG/ML injection 4 mg (4 mg Intravenous Given 05/27/24 0747)  sodium chloride  0.9 % bolus 1,000 mL (1,000 mLs Intravenous New Bag/Given 05/27/24 0906)  morphine  (PF) 4 MG/ML injection 4 mg (4 mg Intravenous Given 05/27/24 0937)    Clinical Course as of 05/27/24 1208  Thu May 27, 2024  1007 Discussed with Dr. Nieves (urology) who suspects discomfort and leakage around the Foley due to bladder spasms.  Recommends oxybutynin  here and treatment with Gemtesa  as outpatient. [MP]  1205 Lactic acid appropriately downtrending after fluids.  Patient will continue Cipro  prophylactically.  Oxybutynin  provided adequate relief.  Will discharge with Gemtesa  for bladder spasm and instruct for urology follow-up [MP]    Clinical Course User Index [MP] Pamella Ozell LABOR, DO                                 Medical Decision Making 63 year old male with history as presenting for issue with Foley catheter.  Foley catheter has been placed for a month.  Underwent prostate artery embolization with IR yesterday.  Pain at insertion site and leaking urine started last night.  Severe pain today.  Will attempt to flush Foley catheter suspecting obstruction is slowly cause of his issue.  If we are unable to flush we will attempt to replace catheter here.  If there are further issues we may need to speak to urology today  Amount and/or Complexity of Data Reviewed Labs: ordered.  Risk Prescription drug management.        Final diagnoses:  Bladder spasm    ED Discharge Orders          Ordered    Vibegron  (GEMTESA ) 75 MG TABS  Daily        05/27/24 1207               Pamella Ozell LABOR, DO 05/27/24 1208

## 2024-05-27 NOTE — ED Triage Notes (Signed)
 Pt reports having procedure done yesterday where they went through both sides of groin to cut off blood flow to prostate. Indwelling foley catheter In place for past month. Pt reports starting at around 11pm last night he began having severe pain in penis and is draining urine into foley bag as well as around catheter itself. Denies fever.

## 2024-05-27 NOTE — Discharge Instructions (Signed)
 You were seen in the emerged part for pain in your catheter and you are leaking urine The pain is caused by bladder spasm Your pain improved after medication used to treat bladder spasm We spoke with urology who recommends starting a medication called Gemtesa  to help with bladder spasm Take this medication daily to help with bladder spasm pain Follow-up with urology as scheduled Return to the emerged part for severe pain or issues with your Foley Continue taking your antibiotic (ciprofloxacin ) and all of the medications at home

## 2024-05-27 NOTE — ED Notes (Signed)
 Flushed pt's indwelling foley catheter with sterile water per EDP. Pt tolerated well and no blockages or obstructions noted.

## 2024-05-29 LAB — URINE CULTURE: Culture: 30000 — AB

## 2024-05-30 ENCOUNTER — Telehealth (HOSPITAL_BASED_OUTPATIENT_CLINIC_OR_DEPARTMENT_OTHER): Payer: Self-pay | Admitting: *Deleted

## 2024-05-30 NOTE — Telephone Encounter (Signed)
 Post ED Visit - Positive Culture Follow-up: Successful Patient Follow-Up  Culture assessed and recommendations reviewed by:  []  Rankin Dee, Pharm.D. []  Venetia Gully, [address].D., BCPS AQ-ID []  Garrel Crews, Pharm.D., BCPS []  Almarie Lunger, Pharm.D., BCPS []  [place], [address].D., BCPS, AAHIVP []  Rosaline Bihari, Pharm.D., BCPS, AAHIVP []  Vernell Meier, PharmD, BCPS []  Latanya Hint, PharmD, BCPS []  Donald Medley, PharmD, BCPS [x]  Dorn Buttner, PharmD  Positive urine culture  Plan: Finish Cipro  as prescribed Start Augmentin  875/125mg   1 tab BID x 10 days (qty 20, 0 refills) after Cipro  finished.  Advised pt to call urology office 8/4 for f/u appt. If can't get appt and symptoms worsen, return to ed  Changes discussed with ED provider: Greig Buttery, DO New antibiotic prescription Augmentin  875/125mg  1 tab BID x 10 days Called to [facility], [address] [place], [place]  Contacted patient, date 05/30/24, time 1330   Henry Walker 05/30/2024, 1:32 PM

## 2024-05-30 NOTE — Progress Notes (Signed)
 ED Antimicrobial Stewardship Positive Culture Follow Up   Henry Walker is an 63 y.o. male who presented to [facility] on @ADMITDT @ with a chief complaint of  Chief Complaint  Patient presents with   Post-op Problem    Recent Results (from the past 720 hours)  Culture, blood (Routine x 2)     Status: None (Preliminary result)   Collection Time: 05/27/24  7:05 AM   Specimen: BLOOD RIGHT ARM  Result Value Ref Range Status   Specimen Description BLOOD RIGHT ARM  Final   Special Requests   Final    BOTTLES DRAWN AEROBIC AND ANAEROBIC Blood Culture adequate volume   Culture   Final    NO GROWTH 2 DAYS Performed at [facility], [address], [place], [place] [address]    Report Status PENDING  Incomplete  Urine Culture     Status: Abnormal   Collection Time: 05/27/24  7:05 AM   Specimen: Urine, Random  Result Value Ref Range Status   Specimen Description URINE, RANDOM  Final   Special Requests   Final    NONE Reflexed from [id number] Performed at [facility], [address] [place], [place] [address]    Culture 30,000 COLONIES/mL ENTEROCOCCUS FAECALIS (A)  Final   Report Status 05/29/2024 FINAL  Final   Organism ID, Bacteria ENTEROCOCCUS FAECALIS (A)  Final      Susceptibility   Enterococcus faecalis - MIC*    AMPICILLIN <=2 SENSITIVE Sensitive     NITROFURANTOIN <=16 SENSITIVE Sensitive     VANCOMYCIN 2 SENSITIVE Sensitive     * 30,000 COLONIES/mL ENTEROCOCCUS FAECALIS  Culture, blood (Routine x 2)     Status: None (Preliminary result)   Collection Time: 05/27/24  7:10 AM   Specimen: BLOOD LEFT ARM  Result Value Ref Range Status   Specimen Description BLOOD LEFT ARM  Final   Special Requests   Final    BOTTLES DRAWN AEROBIC AND ANAEROBIC Blood Culture adequate volume   Culture   Final    NO GROWTH 2 DAYS Performed at [facility], [address], [place], [place] [address]    Report Status PENDING  Incomplete   Patient on ciprofloxacin  at  time of ED visit which was continued. I spoke with patient and he only has 2 days left of the prescription (was sent in on 7/23). Given on cipro  at time of ED visit and still growing 30K cfu, warrants investigation.  Patient was to discharge home on cipro  and follow up with urology. Patient has not made urology appointment yet. Endorses continued symptoms (not worsening just continued). Discussed with MD Lowther and plan is as below.  -Call Urology office on 8/4 for f/u ASAP -Finish remaining two days of ciprofloxacin . -THEN, start Augmentin  875/125 mg tablets 1 tab BID for 10 days (Qty 20; Refills 0)  -Encourage to f/u with ED if symptoms worsen and difficulty following up with Urology office  ED Provider: Amy Lowther   Dorn Buttner, PharmD, BCPS 05/30/2024 11:05 AM ED Clinical Pharmacist -  [phone]

## 2024-05-31 ENCOUNTER — Telehealth: Payer: Self-pay | Admitting: Family Medicine

## 2024-05-31 NOTE — Telephone Encounter (Signed)
 Called patient, patient has been scheduled for the next available appointment date, 06/22/2024 at 2:30 pm.

## 2024-05-31 NOTE — Telephone Encounter (Signed)
 Copied from CRM [id number]. Topic: Appointments - Appointment Scheduling >> May 31, 2024  9:27 AM Henry Walker wrote:  Pt called saying he needs a hospital fu August 7th for surgery he had done.  There is nothing available until Sept 9

## 2024-06-01 LAB — CULTURE, BLOOD (ROUTINE X 2)
Culture: NO GROWTH
Culture: NO GROWTH
Special Requests: ADEQUATE
Special Requests: ADEQUATE

## 2024-06-16 NOTE — Addendum Note (Signed)
 Encounter addended by: Mayli Covington H on: 06/16/2024 9:42 AM  Actions taken: Imaging Exam ended

## 2024-06-21 ENCOUNTER — Encounter: Payer: Self-pay | Admitting: Surgery

## 2024-06-21 ENCOUNTER — Ambulatory Visit (HOSPITAL_COMMUNITY)
Admission: RE | Admit: 2024-06-21 | Discharge: 2024-06-21 | Disposition: A | Discharge status: Home / Self Care | Source: Ambulatory Visit | Attending: Surgery | Admitting: Surgery

## 2024-06-21 ENCOUNTER — Ambulatory Visit: Attending: Surgery | Admitting: Surgery

## 2024-06-21 VITALS — BP 115/74 | HR 53 | Temp 97.9°F | Ht 67.0 in | Wt 163.0 lb

## 2024-06-21 DIAGNOSIS — I739 Peripheral vascular disease, unspecified: Secondary | ICD-10-CM

## 2024-06-21 LAB — VAS US ABI WITH/WO TBI
Left ABI: 0.84
Right ABI: 0.93

## 2024-06-21 NOTE — Progress Notes (Signed)
 [facility]  Patient name: Henry Walker MRN: [id number] DOB: Jul 05, 1961 Sex: male   REQUESTING PROVIDER:    Dr. Charolett   REASON FOR CONSULT:    PAD  HISTORY OF PRESENT ILLNESS:   Henry Walker is a 63 y.o. 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  This was discovered on CT scan imaging for his prostate cancer.  He denies any symptoms of claudication.  He does not have any wounds on his legs or rest pain.  The patient is medically managed for hypertension.  He takes a statin for hypercholesterolemia.  He is a current smoker, however is taking medicine to stop and he has cut back to half a pack a day.  He is a type II diabetic.  PAST MEDICAL HISTORY    Past Medical History:  Diagnosis Date   Benign localized prostatic hyperplasia with lower urinary tract symptoms (LUTS)    Chronic gout    ED (erectile dysfunction)    Emphysema of lung (HCC)    Full dentures    GERD (gastroesophageal reflux disease)    Hx of adenomatous colonic polyps 10/06/2014   Hyperlipidemia    Hypertension    Malignant neoplasm prostate (HCC) 05/2023   urologist--- dr pace/  radiation onologist--- dr patrcia;  dx 08/ 2024, gleason 3 +4   Type 2 diabetes mellitus (HCC)    followed by pcp;    (07-17-2023  per pt checks blood sugar every other day)     FAMILY HISTORY   Family History  Problem Relation Age of Onset   Hypertension Mother    Cirrhosis Father    Hypertension Sister    Stroke Brother    Colon cancer Neg Hx    Esophageal cancer Neg Hx    Rectal cancer Neg Hx    Stomach cancer Neg Hx    Colon polyps Neg Hx     SOCIAL HISTORY:   Social History   Socioeconomic History   Marital status: Single    Spouse name: Not on file   Number of children: Not on file   Years of education: Not on file   Highest education level: Not on file  Occupational History   Not on file  Tobacco  Use   Smoking status: Every Day    Current packs/day: 0.50    Types: Cigarettes   Smokeless tobacco: Never   Tobacco comments:    07-17-2023  per pt smoked 1/2 ppd,  smoked since age 54  Vaping Use   Vaping status: Never Used  Substance and Sexual Activity   Alcohol use: No    Alcohol/week: 0.0 standard drinks of alcohol   Drug use: Never   Sexual activity: Not on file  Other Topics Concern   Not on file  Social History Narrative   Retired Customer service manager (gas, Mining engineer, cable)   Social Drivers of Corporate investment banker Strain: Medium Risk (04/13/2024)   Overall Financial Resource Strain (CARDIA)    Difficulty of Paying Living Expenses: Somewhat hard  Food Insecurity: No Food Insecurity (04/13/2024)   Hunger Vital Sign    Worried About Running Out of Food in the Last Year: Never true    Ran Out of Food in the Last Year: Never true  Transportation Needs: No Transportation Needs (04/13/2024)   PRAPARE - Administrator, Civil Service (Medical): No    Lack of Transportation (Non-Medical): No  Physical Activity: Sufficiently Active (04/13/2024)  Exercise Vital Sign    Days of Exercise per Week: 6 days    Minutes of Exercise per Session: 30 min  Stress: No Stress Concern Present (04/13/2024)   [employer] of Occupational Health - Occupational Stress Questionnaire    Feeling of Stress: Not at all  Social Connections: Moderately Integrated (04/13/2024)   Social Connection and Isolation Panel    Frequency of Communication with Friends and Family: More than three times a week    Frequency of Social Gatherings with Friends and Family: More than three times a week    Attends Religious Services: More than 4 times per year    Active Member of [employer] or Organizations: Yes    Attends Banker Meetings: Never    Marital Status: Divorced  Catering manager Violence: Not At Risk (04/13/2024)   Humiliation, Afraid, Rape, and Kick questionnaire    Fear of  Current or Ex-Partner: No    Emotionally Abused: No    Physically Abused: No    Sexually Abused: No    ALLERGIES:    No Known Allergies  CURRENT MEDICATIONS:    Current Outpatient Medications  Medication Sig Dispense Refill   albuterol  (VENTOLIN  HFA) 108 (90 Base) MCG/ACT inhaler Inhale 2 puffs into the lungs every 6 (six) hours as needed for wheezing or shortness of breath. 8 g 2   allopurinol  (ZYLOPRIM ) 100 MG tablet Take 1 tablet (100 mg total) by mouth daily. 90 tablet 1   amLODipine  (NORVASC ) 10 MG tablet Take 1 tablet (10 mg total) by mouth daily. 90 tablet 1   atorvastatin  (LIPITOR) 80 MG tablet Take 1 tablet (80 mg total) by mouth daily. 90 tablet 1   Blood Glucose Monitoring Suppl (ONE TOUCH ULTRA 2) w/Device KIT Use as directed 3 times daily before meals. 1 kit 0   buPROPion  (WELLBUTRIN  XL) 150 MG 24 hr tablet Take 1 tablet (150 mg total) by mouth daily. For smoking cessation 90 tablet 1   carvedilol  (COREG ) 12.5 MG tablet Take 1 tablet (12.5 mg total) by mouth 2 (two) times daily with a meal. 180 tablet 1   cetirizine  (ZYRTEC  ALLERGY) 10 MG tablet Take 1 tablet (10 mg total) by mouth daily. 30 tablet 11   ciprofloxacin  (CIPRO ) 500 MG tablet Take 1 tablet (500 mg total) by mouth 2 (two) times daily. 20 tablet 0   ciprofloxacin  (CIPRO ) 500 MG tablet Take 1 tablet (500 mg total) by mouth 2 (two) times daily for 7 days. 14 tablet 0   fluticasone  (FLONASE ) 50 MCG/ACT nasal spray Place 2 sprays into both nostrils daily. (Patient taking differently: Place 2 sprays into both nostrils daily as needed for allergies.) 16 g 6   glucose blood (ONETOUCH ULTRA) test strip Use as instructed 3 times daily 100 each 12   Lancet Devices (ACCU-CHEK SOFTCLIX) lancets Use as instructed 1 each 0   Lancets (ONETOUCH ULTRASOFT) lancets Use as instructed 3 times daily 100 each 12   metFORMIN  (GLUCOPHAGE -XR) 500 MG 24 hr tablet Take 1 tablet (500 mg total) by mouth daily with breakfast. 90 tablet 1    oxyCODONE  (OXY IR/ROXICODONE ) 5 MG immediate release tablet Take 1-2 tablets (5-10 mg total) by mouth every 6 (six) hours as needed for severe pain (pain score 7-10). 45 tablet 0   pantoprazole  (PROTONIX ) 40 MG tablet Take 1 tablet (40 mg total) by mouth daily. 90 tablet 1   phenazopyridine  (PYRIDIUM ) 100 MG tablet Take 1 tablet (100 mg total) by mouth in the morning,  at noon, and at bedtime for 7 days. 21 tablet 0   phenazopyridine  (PYRIDIUM ) 200 MG tablet Take 200 mg by mouth 3 (three) times daily as needed for pain.     potassium chloride  SA (KLOR-CON  M) 20 MEQ tablet Take 2 tablets (40 mEq total) by mouth daily. 14 tablet 0   solifenacin  (VESICARE ) 5 MG tablet Take 1 tablet (5 mg total) by mouth daily for 7 days. 7 tablet 0   tamsulosin  (FLOMAX ) 0.4 MG CAPS capsule Take 1 capsule (0.4 mg total) by mouth daily after supper. 30 capsule 5   traZODone  (DESYREL ) 50 MG tablet Take 1 tablet (50 mg total) by mouth at bedtime. 90 tablet 1   valsartan -hydrochlorothiazide  (DIOVAN -HCT) 320-25 MG tablet Take 1 tablet by mouth daily. 90 tablet 1   Vibegron  (GEMTESA ) 75 MG TABS Take 1 tablet (75 mg total) by mouth daily. 30 tablet 0   No current facility-administered medications for this visit.    REVIEW OF SYSTEMS:   [X]  denotes positive finding, [ ]  denotes negative finding Cardiac  Comments:  Chest pain or chest pressure:    Shortness of breath upon exertion:    Short of breath when lying flat:    Irregular heart rhythm:        Vascular    Pain in calf, thigh, or hip brought on by ambulation:    Pain in feet at night that wakes you up from your sleep:     Blood clot in your veins:    Leg swelling:         Pulmonary    Oxygen at home:    Productive cough:     Wheezing:         Neurologic    Sudden weakness in arms or legs:     Sudden numbness in arms or legs:     Sudden onset of difficulty speaking or slurred speech:    Temporary loss of vision in one eye:     Problems with dizziness:          Gastrointestinal    Blood in stool:      Vomited blood:         Genitourinary    Burning when urinating:     Blood in urine:        Psychiatric    Major depression:         Hematologic    Bleeding problems:    Problems with blood clotting too easily:        Skin    Rashes or ulcers:        Constitutional    Fever or chills:     PHYSICAL EXAM:   Vitals:   06/21/24 1049  BP: 115/74  Pulse: (!) 53  Temp: 97.9 F (36.6 C)  SpO2: 95%  Weight: 163 lb (73.9 kg)  Height: 5' 7 (1.702 m)    GENERAL: The patient is a well-nourished male, in no acute distress. The vital signs are documented above. CARDIAC: There is a regular rate and rhythm.  VASCULAR: I had trouble feeling femoral pulses.  He had a palpable right posterior tibial on the left PULMONARY: Nonlabored respirations ABDOMEN: Soft and non-tender  MUSCULOSKELETAL: There are no major deformities or cyanosis. NEUROLOGIC: No focal weakness or paresthesias are detected. SKIN: There are no ulcers or rashes noted. PSYCHIATRIC: The patient has a normal affect.  STUDIES:   I have reviewed the following CT scan: VASCULAR: Occlusion of the left common iliac artery with  reconstitution of flow in the internal iliac artery. Stenosis of the right common iliac artery.   Stenosis of the distal aorta at the bifurcation.   NON-VASCULAR: Bilateral adrenal nodules. Recommend adrenal protocol CT.   Right kidney cysts.   1. Severe aortoiliac peripheral arterial disease with extensive and irregular/ulcerated fibrofatty atherosclerotic plaque resulting in significant narrowing of the distal aorta, the left common iliac artery and the mid aspect of the right external iliac artery. 2. Both internal iliac arteries remain patent although there is some stenosis of the origin of the anterior division on the left. 3. Right prostatic artery appears to arise proximally from the anterior division. 4. On the left, there appear  to be 2 prostatic arteries, 1 arising from a common trunk with the superior vesicular artery and the second arising just distal to this trunk from the anterior division. This second branch may only supply the seminal vesicles. 5. Increased arterial enhancement along the left wall of the rectum with a prominent middle rectal artery. Suspect possible cross collateral flow between the prostatic arteries and middle rectal artery in this region. Attention at the time of angiography if attempted.   NON-VASCULAR   1. Diffuse chronic bladder wall thickening with Foley catheter in place. 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 2. Additional ancillary findings as above.     ABI/TBIToday's ABIToday's TBIPrevious ABIPrevious TBI  +-------+-----------+-----------+------------+------------+  Right 0.93       0.66                                 +-------+-----------+-----------+------------+------------+  Left  0.84       0.57                                 +-------+-----------+-----------+------------+------------+     ASSESSMENT and PLAN   PAD: The patient has aortoiliac occlusive disease on CT scan imaging however he is asymptomatic.  Therefore this will be attempted to be managed medically.  We stressed the importance of management of diabetes with an A1c less than 6.  He is already taking a statin for hypercholesterolemia.  His blood pressure is well-controlled.  He is trying to stop smoking and is down to half pack a day on Chantix.  I stressed the importance of an exercise program.  He knows to contact me should he develop a nonhealing wound.  Otherwise he will follow-up in 1 year with repeat imaging.   Malvina Serene CLORE, MD, FACS [facility] [phone] Pager [phone]

## 2024-06-22 ENCOUNTER — Encounter: Payer: Self-pay | Admitting: Family Medicine

## 2024-06-22 ENCOUNTER — Ambulatory Visit: Attending: Family Medicine | Admitting: Family Medicine

## 2024-06-22 VITALS — BP 114/66 | HR 60 | Ht 67.0 in | Wt 163.8 lb

## 2024-06-22 DIAGNOSIS — F17200 Nicotine dependence, unspecified, uncomplicated: Secondary | ICD-10-CM

## 2024-06-22 DIAGNOSIS — Z1211 Encounter for screening for malignant neoplasm of colon: Secondary | ICD-10-CM

## 2024-06-22 DIAGNOSIS — N401 Enlarged prostate with lower urinary tract symptoms: Secondary | ICD-10-CM

## 2024-06-22 DIAGNOSIS — C61 Malignant neoplasm of prostate: Secondary | ICD-10-CM | POA: Diagnosis not present

## 2024-06-22 DIAGNOSIS — Z23 Encounter for immunization: Secondary | ICD-10-CM | POA: Diagnosis not present

## 2024-06-22 DIAGNOSIS — N138 Other obstructive and reflux uropathy: Secondary | ICD-10-CM

## 2024-06-22 DIAGNOSIS — B351 Tinea unguium: Secondary | ICD-10-CM

## 2024-06-22 DIAGNOSIS — J438 Other emphysema: Secondary | ICD-10-CM | POA: Diagnosis not present

## 2024-06-22 DIAGNOSIS — I739 Peripheral vascular disease, unspecified: Secondary | ICD-10-CM

## 2024-06-22 MED ORDER — TERBINAFINE HCL 250 MG PO TABS: 250.0000 mg | ORAL_TABLET | Freq: Every day | ORAL | 0 refills | Status: AC

## 2024-06-22 MED ORDER — ALBUTEROL SULFATE HFA 108 (90 BASE) MCG/ACT IN AERS: 2.0000 | INHALATION_SPRAY | Freq: Four times a day (QID) | RESPIRATORY_TRACT | 2 refills | Status: AC | PRN

## 2024-06-22 NOTE — Progress Notes (Signed)
 Subjective:  Patient ID: Henry Walker, male    DOB: Feb 12, 1961  Age: 63 y.o. MRN: [id number]  CC: Hospitalization Follow-up     Discussed the use of AI scribe software for clinical note transcription with the patient, who gave verbal consent to proceed.  History of Present Illness Henry Walker is a 63 year old male with a history of type 2 diabetes mellitus (A1c 6.9), hypertension, hyperlipidemia, GERD, gout, emphysema and tobacco abuse (0.5 ppd since he was 14)  prostate cancer  who presents with urinary complications following prostate artery embolization. He had an ED visit 3 weeks ago for same during which his Foley was flushed.  He experiences difficulty urinating post-procedure, requiring reinsertion of a Foley catheter due to bladder swelling and spasms. Urinary leakage occurs around the catheter, described as 'blowouts', with occasional deflation of the catheter balloon leading to dislodgement. He feels embarrassed and inconvenienced by urgency and leakage, having urinated on himself a few times. Despite catheter reinsertion, small urine traces continue to leak.  His next appointment with urology is in 2 days.  He feels generally well with stable blood pressure and A1c levels controlled at 6.4. He has lost weight, eating only once a day due to lack of appetite and a busy schedule with medical appointments. He uses an inhaler for emphysema as needed. He has been unsuccessful with over-the-counter treatments for toenail fungus after insurance issues with Lamisil  which was supposedly prescribed by his podiatrist but he never received.   He had a visit with vascular yesterday and per notes given he is asymptomatic he will continue medical management and work on smoking cessation. He takes bupropion  daily to aid smoking cessation.     Past Medical History:  Diagnosis Date   Benign localized prostatic hyperplasia with lower urinary tract symptoms (LUTS)    Chronic  gout    ED (erectile dysfunction)    Emphysema of lung (HCC)    Full dentures    GERD (gastroesophageal reflux disease)    Hx of adenomatous colonic polyps 10/06/2014   Hyperlipidemia    Hypertension    Malignant neoplasm prostate (HCC) 05/2023   urologist--- dr pace/  radiation onologist--- dr patrcia;  dx 08/ 2024, gleason 3 +4   Type 2 diabetes mellitus (HCC)    followed by pcp;    (07-17-2023  per pt checks blood sugar every other day)    Past Surgical History:  Procedure Laterality Date   COLONOSCOPY WITH PROPOFOL   05/19/2019   dr avram   GOLD SEED IMPLANT N/A 07/25/2023   Procedure: GOLD SEED IMPLANT;  Surgeon: Selma Donnice SAUNDERS, MD;  Location: [facility];  Service: Urology;  Laterality: N/A;   IR ANGIOGRAM PELVIS SELECTIVE OR SUPRASELECTIVE  05/26/2024   IR EMBO ARTERIAL NOT HEMORR HEMANG INC GUIDE ROADMAPPING  05/26/2024   IR EMBO ARTERIAL NOT HEMORR HEMANG INC GUIDE ROADMAPPING  05/26/2024   IR RADIOLOGIST EVAL & MGMT  04/21/2024   IR RADIOLOGIST EVAL & MGMT  04/23/2024   IR RADIOLOGIST EVAL & MGMT  05/07/2024   IR US  GUIDE VASC ACCESS LEFT  05/26/2024   IR US  GUIDE VASC ACCESS LEFT  05/26/2024   IR US  GUIDE VASC ACCESS LEFT  05/26/2024   IR US  GUIDE VASC ACCESS RIGHT  05/26/2024   POSTERIOR FUSION LUMBAR SPINE  09/03/2006   @[facility]  by dr m. gust;  laminectomy/  disectomy/ fusion L5--S1   SPACE OAR INSTILLATION N/A 07/25/2023   Procedure: SPACE OAR INSTILLATION;  Surgeon: Selma Donnice SAUNDERS, MD;  Location: [facility];  Service: Urology;  Laterality: N/A;    Family History  Problem Relation Age of Onset   Hypertension Mother    Cirrhosis Father    Hypertension Sister    Stroke Brother    Colon cancer Neg Hx    Esophageal cancer Neg Hx    Rectal cancer Neg Hx    Stomach cancer Neg Hx    Colon polyps Neg Hx     Social History   Socioeconomic History   Marital status: Single    Spouse name: Not on file   Number of children: Not on file    Years of education: Not on file   Highest education level: Not on file  Occupational History   Not on file  Tobacco Use   Smoking status: Every Day    Current packs/day: 0.50    Types: Cigarettes   Smokeless tobacco: Never   Tobacco comments:    07-17-2023  per pt smoked 1/2 ppd,  smoked since age 53  Vaping Use   Vaping status: Never Used  Substance and Sexual Activity   Alcohol use: No    Alcohol/week: 0.0 standard drinks of alcohol   Drug use: Never   Sexual activity: Not on file  Other Topics Concern   Not on file  Social History Narrative   Retired Customer service manager (gas, Mining engineer, cable)   Social Drivers of Corporate investment banker Strain: Medium Risk (04/13/2024)   Overall Financial Resource Strain (CARDIA)    Difficulty of Paying Living Expenses: Somewhat hard  Food Insecurity: No Food Insecurity (04/13/2024)   Hunger Vital Sign    Worried About Running Out of Food in the Last Year: Never true    Ran Out of Food in the Last Year: Never true  Transportation Needs: No Transportation Needs (04/13/2024)   PRAPARE - Administrator, Civil Service (Medical): No    Lack of Transportation (Non-Medical): No  Physical Activity: Sufficiently Active (04/13/2024)   Exercise Vital Sign    Days of Exercise per Week: 6 days    Minutes of Exercise per Session: 30 min  Stress: No Stress Concern Present (04/13/2024)   [employer] of Occupational Health - Occupational Stress Questionnaire    Feeling of Stress: Not at all  Social Connections: Moderately Integrated (04/13/2024)   Social Connection and Isolation Panel    Frequency of Communication with Friends and Family: More than three times a week    Frequency of Social Gatherings with Friends and Family: More than three times a week    Attends Religious Services: More than 4 times per year    Active Member of [employer] or Organizations: Yes    Attends Banker Meetings: Never    Marital Status: Divorced     No Known Allergies  Outpatient Medications Prior to Visit  Medication Sig Dispense Refill   allopurinol  (ZYLOPRIM ) 100 MG tablet Take 1 tablet (100 mg total) by mouth daily. 90 tablet 1   amLODipine  (NORVASC ) 10 MG tablet Take 1 tablet (10 mg total) by mouth daily. 90 tablet 1   atorvastatin  (LIPITOR) 80 MG tablet Take 1 tablet (80 mg total) by mouth daily. 90 tablet 1   Blood Glucose Monitoring Suppl (ONE TOUCH ULTRA 2) w/Device KIT Use as directed 3 times daily before meals. 1 kit 0   buPROPion  (WELLBUTRIN  XL) 150 MG 24 hr tablet Take 1 tablet (150 mg total) by mouth daily.  For smoking cessation 90 tablet 1   carvedilol  (COREG ) 12.5 MG tablet Take 1 tablet (12.5 mg total) by mouth 2 (two) times daily with a meal. 180 tablet 1   cetirizine  (ZYRTEC  ALLERGY) 10 MG tablet Take 1 tablet (10 mg total) by mouth daily. 30 tablet 11   fluticasone  (FLONASE ) 50 MCG/ACT nasal spray Place 2 sprays into both nostrils daily. (Patient taking differently: Place 2 sprays into both nostrils daily as needed for allergies.) 16 g 6   glucose blood (ONETOUCH ULTRA) test strip Use as instructed 3 times daily 100 each 12   Lancet Devices (ACCU-CHEK SOFTCLIX) lancets Use as instructed 1 each 0   Lancets (ONETOUCH ULTRASOFT) lancets Use as instructed 3 times daily 100 each 12   metFORMIN  (GLUCOPHAGE -XR) 500 MG 24 hr tablet Take 1 tablet (500 mg total) by mouth daily with breakfast. 90 tablet 1   pantoprazole  (PROTONIX ) 40 MG tablet Take 1 tablet (40 mg total) by mouth daily. 90 tablet 1   potassium chloride  SA (KLOR-CON  M) 20 MEQ tablet Take 2 tablets (40 mEq total) by mouth daily. 14 tablet 0   tamsulosin  (FLOMAX ) 0.4 MG CAPS capsule Take 1 capsule (0.4 mg total) by mouth daily after supper. 30 capsule 5   traZODone  (DESYREL ) 50 MG tablet Take 1 tablet (50 mg total) by mouth at bedtime. 90 tablet 1   valsartan -hydrochlorothiazide  (DIOVAN -HCT) 320-25 MG tablet Take 1 tablet by mouth daily. 90 tablet 1   Vibegron   (GEMTESA ) 75 MG TABS Take 1 tablet (75 mg total) by mouth daily. 30 tablet 0   albuterol  (VENTOLIN  HFA) 108 (90 Base) MCG/ACT inhaler Inhale 2 puffs into the lungs every 6 (six) hours as needed for wheezing or shortness of breath. 8 g 2   ciprofloxacin  (CIPRO ) 500 MG tablet Take 1 tablet (500 mg total) by mouth 2 (two) times daily. (Patient not taking: Reported on 06/22/2024) 20 tablet 0   oxyCODONE  (OXY IR/ROXICODONE ) 5 MG immediate release tablet Take 1-2 tablets (5-10 mg total) by mouth every 6 (six) hours as needed for severe pain (pain score 7-10). (Patient not taking: Reported on 06/22/2024) 45 tablet 0   phenazopyridine  (PYRIDIUM ) 200 MG tablet Take 200 mg by mouth 3 (three) times daily as needed for pain. (Patient not taking: Reported on 06/22/2024)     No facility-administered medications prior to visit.     ROS Review of Systems  Constitutional:  Negative for activity change and appetite change.  HENT:  Negative for sinus pressure and sore throat.   Respiratory:  Negative for chest tightness, shortness of breath and wheezing.   Cardiovascular:  Negative for chest pain and palpitations.  Gastrointestinal:  Negative for abdominal distention, abdominal pain and constipation.  Genitourinary: Negative.   Musculoskeletal: Negative.   Psychiatric/Behavioral:  Negative for behavioral problems and dysphoric mood.     Objective:  BP 114/66   Pulse 60   Ht 5' 7 (1.702 m)   Wt 163 lb 12.8 oz (74.3 kg)   SpO2 98%   BMI 25.65 kg/m      06/22/2024    2:23 PM 06/21/2024   10:49 AM 05/27/2024   10:15 AM  BP/Weight  Systolic BP 114 115 151  Diastolic BP 66 74 68  Wt. (Lbs) 163.8 163   BMI 25.65 kg/m2 25.53 kg/m2       Physical Exam Constitutional:      Appearance: He is well-developed.  Cardiovascular:     Rate and Rhythm: Normal rate.  Heart sounds: Normal heart sounds. No murmur heard. Pulmonary:     Effort: Pulmonary effort is normal.     Breath sounds: Normal breath  sounds. No wheezing or rales.  Chest:     Chest wall: No tenderness.  Abdominal:     General: Bowel sounds are normal. There is no distension.     Palpations: Abdomen is soft. There is no mass.     Tenderness: There is no abdominal tenderness.  Musculoskeletal:        General: Normal range of motion.     Right lower leg: No edema.     Left lower leg: No edema.  Neurological:     Mental Status: He is alert and oriented to person, place, and time.  Psychiatric:        Mood and Affect: Mood normal.    Diabetic Foot Exam - Simple   Simple Foot Form Diabetic Foot exam was performed with the following findings: Yes 06/22/2024  2:50 PM  Visual Inspection See comments: Yes Sensation Testing Intact to touch and monofilament testing bilaterally: Yes Pulse Check See comments: Yes Comments Onychomycosis of left great toenail.  No ulceration or skin breakdown Nonpalpable posterior tibialis and dorsalis pedis bilaterally      ABI from 06/21/2024 +-------+-----------+-----------+------------+------------+  Right 0.93       0.66                                 +-------+-----------+-----------+------------+------------+  Left  0.84       0.57                                 +-------+-----------+-----------+------------+------------+        Latest Ref Rng & Units 05/27/2024    7:30 AM 05/26/2024    7:54 AM 04/27/2024    3:19 PM  CMP  Glucose 70 - 99 mg/dL 811  878  899   BUN 8 - 23 mg/dL 13  14  9    Creatinine 0.61 - 1.24 mg/dL 8.67  8.86  8.94   Sodium 135 - 145 mmol/L 139  137  145   Potassium 3.5 - 5.1 mmol/L 4.0  3.8  4.0   Chloride 98 - 111 mmol/L 104  104  106   CO2 22 - 32 mmol/L 20  23  21    Calcium  8.9 - 10.3 mg/dL 9.9  9.3  9.5   Total Protein 6.5 - 8.1 g/dL 7.8   6.8   Total Bilirubin 0.0 - 1.2 mg/dL 0.8   0.5   Alkaline Phos 38 - 126 U/L 70   70   AST 15 - 41 U/L 32   22   ALT 0 - 44 U/L 38   33     Lipid Panel     Component Value Date/Time   CHOL 132  02/07/2022 1425   TRIG 89 02/07/2022 1425   HDL 29 (L) 02/07/2022 1425   CHOLHDL 4.6 02/07/2022 1425   CHOLHDL 5.9 (H) 01/05/2016 1155   VLDL 30 01/05/2016 1155   LDLCALC 86 02/07/2022 1425    CBC    Component Value Date/Time   WBC 13.0 (H) 05/27/2024 0730   RBC 5.42 05/27/2024 0730   HGB 14.9 05/27/2024 0730   HGB 15.1 02/07/2022 1425   HCT 43.8 05/27/2024 0730   HCT 44.9 02/07/2022 1425   PLT 345  05/27/2024 0730   PLT 315 02/07/2022 1425   MCV 80.8 05/27/2024 0730   MCV 81 02/07/2022 1425   MCH 27.5 05/27/2024 0730   MCHC 34.0 05/27/2024 0730   RDW 14.9 05/27/2024 0730   RDW 13.7 02/07/2022 1425   LYMPHSABS 1.9 05/27/2024 0730   LYMPHSABS 3.8 (H) 02/07/2022 1425   MONOABS 1.0 05/27/2024 0730   EOSABS 0.1 05/27/2024 0730   EOSABS 0.2 02/07/2022 1425   BASOSABS 0.0 05/27/2024 0730   BASOSABS 0.1 02/07/2022 1425    Lab Results  Component Value Date   HGBA1C 6.4 04/27/2024       Assessment & Plan BPH with urinary obstruction Urinary retention with indwelling Foley catheter after prostate artery embolization Urinary retention persists post-prostate artery embolization, requiring Foley catheter reinsertion due to bladder spasms and swelling. Leakage around the catheter and balloon deflation noted. - Advise wearing protective undergarments until issue resolves. - Follow up with urologist on Thursday for further management.  Prostate cancer, status post embolization Status post prostate artery embolization for prostate cancer. Complications include urinary retention. Prostate cancer is treated, and prognosis is favorable. -Currently on vibegron  - Follow up with urologist on Thursday.  Onychomycosis (nail fungus) of foot Persistent onychomycosis of the foot. Previous prescription for Lamisil  was not filled due to insurance issues with a 90-day supply. - Send prescription for Lamisil  250 mg to [facility] with a 30-day supply.  Peripheral artery disease Peripheral  artery disease with decreased pulses not palpable manually but detectable via ultrasound.  - Continue current management and medications. - Encourage smoking cessation to improve vascular health.   Other emphysema Emphysema managed with albuterol  inhaler as needed for bronchitis flare-ups. - Refill albuterol  inhaler prescription.  Nicotine dependence, ongoing tobacco use Ongoing tobacco use with nicotine dependence. Currently using bupropion  (Wellbutrin ) for smoking cessation. - Continue bupropion  for smoking cessation. - Encourage continued efforts to quit smoking.      Healthcare maintenance Screening for colon cancer Cologuard ordered  Meds ordered this encounter  Medications   albuterol  (VENTOLIN  HFA) 108 (90 Base) MCG/ACT inhaler    Sig: Inhale 2 puffs into the lungs every 6 (six) hours as needed for wheezing or shortness of breath.    Dispense:  8 g    Refill:  2   terbinafine  (LAMISIL ) 250 MG tablet    Sig: Take 1 tablet (250 mg total) by mouth daily.    Dispense:  90 tablet    Refill:  0    Follow-up: Return for previously scheduled appointment.       Corrina Sabin, MD, FAAFP. [facility] [place], [place] [phone]   06/22/2024, 2:59 PM

## 2024-06-22 NOTE — Patient Instructions (Signed)
 VISIT SUMMARY:  Today, we discussed your ongoing urinary complications following your prostate artery embolization, as well as your general health and other medical conditions. We reviewed your current medications and made some adjustments to better manage your symptoms and overall health.  YOUR PLAN:  -URINARY RETENTION WITH INDWELLING FOLEY CATHETER: Urinary retention means you are having difficulty emptying your bladder completely. This is likely due to swelling and spasms following your prostate artery embolization. We recommend wearing protective undergarments until this issue resolves and following up with your urologist on Thursday for further management.  -PROSTATE CANCER, STATUS POST EMBOLIZATION: Your prostate cancer has been treated with prostate artery embolization. While the prognosis is favorable, you are experiencing some complications like urinary retention. Please follow up with your urologist on Thursday.  -ONYCHOMYCOSIS (NAIL FUNGUS) OF FOOT: Onychomycosis is a fungal infection of the toenails. We have sent a prescription for Lamisil  250 mg to [facility] with a 30-day supply to help treat this condition.  -PERIPHERAL ARTERY DISEASE: Peripheral artery disease is a condition where the blood vessels outside your heart and brain narrow, reducing blood flow. Your recent vascular assessment showed no new complications. Please continue your current management and medications, and continue your efforts to quit smoking to improve your vascular health.  -TYPE 2 DIABETES MELLITUS WITH COMPLICATIONS: Type 2 diabetes is a condition where your body does not use insulin properly, leading to high blood sugar levels. Your diabetes is currently well-controlled with good A1c levels. Please continue your current diabetes management and medications.  -HYPERTENSION ASSOCIATED WITH DIABETES: Hypertension, or high blood pressure, is often associated with diabetes. Your blood pressure is currently  well-controlled. Please continue your current antihypertensive regimen.  -OTHER EMPHYSEMA: Emphysema is a lung condition that causes shortness of breath. You are managing this with an albuterol  inhaler as needed for bronchitis flare-ups. We have refilled your albuterol  inhaler prescription.  -NICOTINE DEPENDENCE, ONGOING TOBACCO USE: Nicotine dependence means you are addicted to tobacco. You are currently using bupropion  (Wellbutrin ) to help quit smoking. Please continue taking bupropion  and keep trying to quit smoking.  INSTRUCTIONS:  Please follow up with your urologist on Thursday for further management of your urinary retention and prostate cancer. Continue taking your current medications as prescribed. Pick up your Lamisil  prescription from [facility]. If you have any new symptoms or concerns, please contact our office.

## 2024-06-25 ENCOUNTER — Other Ambulatory Visit: Payer: Self-pay | Admitting: Interventional Radiology

## 2024-06-25 DIAGNOSIS — N401 Enlarged prostate with lower urinary tract symptoms: Secondary | ICD-10-CM

## 2024-07-07 ENCOUNTER — Ambulatory Visit: Payer: Self-pay | Admitting: Family Medicine

## 2024-07-07 DIAGNOSIS — Z860101 Personal history of adenomatous and serrated colon polyps: Secondary | ICD-10-CM

## 2024-07-07 LAB — COLOGUARD

## 2024-07-11 NOTE — Progress Notes (Signed)
 This encounter was conducted via the [employer] providing interactive audio and visual communication.  The patient provided verbal consent to conduct a virtual appointment.  The patient was located at their primary residence during this encounter.  Referring Physician(s): Pace,Maryellen D   Chief Complaint: The patient is seen in virtual video follow up today s/p prostate artery embolization 05/26/24  History of present illness:  HPI from last clinic visit 05/07/24 Henry Walker is a 63 y.o. male with a medical history significant for DM2, HTN, emphysema, gout, early stage prostate cancer (1 core positive 07/25/23, s/p EBRT 11/24) and benign prostatic hyperplasia with lower urinary tract symptoms. He developed severe urinary retention during his radiation therapy and has been unable to void since then. He is unable to tolerate intermittent catheterization and has required foley catheterization.   His urology team discussed TURP versus prostate artery embolization however TURP is not the preferred treatment due to his history of radiation therapy. The patient has been referred to Interventional Radiology to discuss prostate artery embolization as a possible treatment option.   The patient expressed his frustration and depression regarding his dependence on the foley catheter. He has failed multiple voiding trials. We discussed the rationale, periprocedural expectations, risks, and long term expected outcomes of prostate artery embolization. He was agreeable and wished to proceed. A CTA Pelvis was ordered for further work up and he presents today via virtual video visit to discuss these results.   He endorses pain and cramping in his buttocks and thighs with ambulation, which resolves with rest.   A CTA pelvis was performed 04/21/24 and revealed severe aortoiliac peripheral arterial disease. He also endorsed symptoms of inflow claudication. A CTA abdomen was ordered and this  confirmed the findings of severe aortoiliac peripheral arterial disease. We discussed proceeding with prostate artery embolization with a referral to Vascular Surgery for evaluation and management of aortoiliac occlusive disease. The patient was agreeable to this and on 05/26/24 he underwent a technically successful right PAE. Treatment of the left artery was unsuccessful due to the inability to isolate the prostatic arterial supply. The patient tolerated the procedure well with same day discharge. He was evaluated in the ED the next day for foley catheter leakage which was determined to be from bladder spasms. He was treated with Oxybutynin  in the ED and was discharged with a prescription for Gemtesa .   He presents today to the IR outpatient clinic via virtual video visit for PAE follow up. He had his consultation with Vascular Surgery 06/21/24 and the plan for his aortoiliac occlusive disease will be medical management.  Thankfully he passed his voiding trial and is now catheter free.  He unfortunately is experiencing urinary frequency, nocturia, and incomplete emptying now.  He is concerned he may have to increase his medication or have his Foley replaced.     Past Medical History:  Diagnosis Date   Benign localized prostatic hyperplasia with lower urinary tract symptoms (LUTS)    Chronic gout    ED (erectile dysfunction)    Emphysema of lung (HCC)    Full dentures    GERD (gastroesophageal reflux disease)    Hx of adenomatous colonic polyps 10/06/2014   Hyperlipidemia    Hypertension    Malignant neoplasm prostate [facility]) 05/2023   urologist--- dr pace/  radiation onologist--- dr patrcia;  dx 08/ 2024, gleason 3 +4   Type 2 diabetes mellitus (HCC)    followed by pcp;    (07-17-2023  per pt  checks blood sugar every other day)    Past Surgical History:  Procedure Laterality Date   COLONOSCOPY WITH PROPOFOL   05/19/2019   dr avram   GOLD SEED IMPLANT N/A 07/25/2023   Procedure: GOLD SEED  IMPLANT;  Surgeon: Selma Donnice SAUNDERS, MD;  Location: [facility];  Service: Urology;  Laterality: N/A;   IR ANGIOGRAM PELVIS SELECTIVE OR SUPRASELECTIVE  05/26/2024   IR EMBO ARTERIAL NOT HEMORR HEMANG INC GUIDE ROADMAPPING  05/26/2024   IR EMBO ARTERIAL NOT HEMORR HEMANG INC GUIDE ROADMAPPING  05/26/2024   IR RADIOLOGIST EVAL & MGMT  04/21/2024   IR RADIOLOGIST EVAL & MGMT  04/23/2024   IR RADIOLOGIST EVAL & MGMT  05/07/2024   IR US  GUIDE VASC ACCESS LEFT  05/26/2024   IR US  GUIDE VASC ACCESS LEFT  05/26/2024   IR US  GUIDE VASC ACCESS LEFT  05/26/2024   IR US  GUIDE VASC ACCESS RIGHT  05/26/2024   POSTERIOR FUSION LUMBAR SPINE  09/03/2006   @[facility]  by dr christella. gust;  laminectomy/  disectomy/ fusion L5--S1   SPACE OAR INSTILLATION N/A 07/25/2023   Procedure: SPACE OAR INSTILLATION;  Surgeon: Selma Donnice SAUNDERS, MD;  Location: [facility];  Service: Urology;  Laterality: N/A;    Allergies: Patient has no known allergies.  Medications: Prior to Admission medications   Medication Sig Start Date End Date Taking? Authorizing Provider  albuterol  (VENTOLIN  HFA) 108 (90 Base) MCG/ACT inhaler Inhale 2 puffs into the lungs every 6 (six) hours as needed for wheezing or shortness of breath. 06/22/24   Delbert Clam, MD  allopurinol  (ZYLOPRIM ) 100 MG tablet Take 1 tablet (100 mg total) by mouth daily. 04/27/24   Newlin, Enobong, MD  amLODipine  (NORVASC ) 10 MG tablet Take 1 tablet (10 mg total) by mouth daily. 04/27/24   Newlin, Enobong, MD  atorvastatin  (LIPITOR) 80 MG tablet Take 1 tablet (80 mg total) by mouth daily. 04/27/24   Newlin, Enobong, MD  Blood Glucose Monitoring Suppl (ONE TOUCH ULTRA 2) w/Device KIT Use as directed 3 times daily before meals. 07/06/19   Newlin, Enobong, MD  buPROPion  (WELLBUTRIN  XL) 150 MG 24 hr tablet Take 1 tablet (150 mg total) by mouth daily. For smoking cessation 04/27/24   Newlin, Enobong, MD  carvedilol  (COREG ) 12.5 MG tablet Take 1 tablet (12.5 mg total) by mouth  2 (two) times daily with a meal. 04/27/24   Delbert Clam, MD  cetirizine  (ZYRTEC  ALLERGY) 10 MG tablet Take 1 tablet (10 mg total) by mouth daily. 01/06/23   Mayers, Cari S, PA-C  ciprofloxacin  (CIPRO ) 500 MG tablet Take 1 tablet (500 mg total) by mouth 2 (two) times daily. Patient not taking: Reported on 06/22/2024 09/18/23   Patrcia Donnice, MD  fluticasone  (FLONASE ) 50 MCG/ACT nasal spray Place 2 sprays into both nostrils daily. Patient taking differently: Place 2 sprays into both nostrils daily as needed for allergies. 01/06/23   Mayers, Cari S, PA-C  glucose blood (ONETOUCH ULTRA) test strip Use as instructed 3 times daily 07/06/19   Newlin, Enobong, MD  Lancet Devices [facility]) lancets Use as instructed 08/04/15   Oleta Berwyn LABOR, NP  Lancets [facility] ULTRASOFT) lancets Use as instructed 3 times daily 07/06/19   Newlin, Enobong, MD  metFORMIN  (GLUCOPHAGE -XR) 500 MG 24 hr tablet Take 1 tablet (500 mg total) by mouth daily with breakfast. 04/27/24   Newlin, Enobong, MD  oxyCODONE  (OXY IR/ROXICODONE ) 5 MG immediate release tablet Take 1-2 tablets (5-10 mg total) by mouth every 6 (  six) hours as needed for severe pain (pain score 7-10). Patient not taking: Reported on 06/22/2024 09/18/23   Patrcia Cough, MD  pantoprazole  (PROTONIX ) 40 MG tablet Take 1 tablet (40 mg total) by mouth daily. 04/27/24   Newlin, Enobong, MD  phenazopyridine  (PYRIDIUM ) 200 MG tablet Take 200 mg by mouth 3 (three) times daily as needed for pain. Patient not taking: Reported on 06/22/2024    [provider]  potassium chloride  SA (KLOR-CON  M) 20 MEQ tablet Take 2 tablets (40 mEq total) by mouth daily. 09/15/23   Prosperi, Christian H, PA-C  tamsulosin  (FLOMAX ) 0.4 MG CAPS capsule Take 1 capsule (0.4 mg total) by mouth daily after supper. 08/22/23   Patrcia Cough, MD  terbinafine  (LAMISIL ) 250 MG tablet Take 1 tablet (250 mg total) by mouth daily. 06/22/24   Newlin, Enobong, MD  traZODone  (DESYREL ) 50 MG  tablet Take 1 tablet (50 mg total) by mouth at bedtime. 04/27/24   Newlin, Enobong, MD  valsartan -hydrochlorothiazide  (DIOVAN -HCT) 320-25 MG tablet Take 1 tablet by mouth daily. 04/27/24   Newlin, Enobong, MD  tadalafil  (CIALIS ) 20 MG tablet Take 0.5-1 tablets (10-20 mg total) by mouth every other day as needed for erectile dysfunction. Patient not taking: Reported on 09/19/2014 07/21/14 07/17/15  Colton Drape, MD     Family History  Problem Relation Age of Onset   Hypertension Mother    Cirrhosis Father    Hypertension Sister    Stroke Brother    Colon cancer Neg Hx    Esophageal cancer Neg Hx    Rectal cancer Neg Hx    Stomach cancer Neg Hx    Colon polyps Neg Hx     Social History   Socioeconomic History   Marital status: Single    Spouse name: Not on file   Number of children: Not on file   Years of education: Not on file   Highest education level: Not on file  Occupational History   Not on file  Tobacco Use   Smoking status: Every Day    Current packs/day: 0.50    Types: Cigarettes   Smokeless tobacco: Never   Tobacco comments:    07-17-2023  per pt smoked 1/2 ppd,  smoked since age 58  Vaping Use   Vaping status: Never Used  Substance and Sexual Activity   Alcohol use: No    Alcohol/week: 0.0 standard drinks of alcohol   Drug use: Never   Sexual activity: Not on file  Other Topics Concern   Not on file  Social History Narrative   Retired Customer service manager (gas, Mining engineer, cable)   Social Drivers of Corporate investment banker Strain: Medium Risk (04/13/2024)   Overall Financial Resource Strain (CARDIA)    Difficulty of Paying Living Expenses: Somewhat hard  Food Insecurity: No Food Insecurity (04/13/2024)   Hunger Vital Sign    Worried About Running Out of Food in the Last Year: Never true    Ran Out of Food in the Last Year: Never true  Transportation Needs: No Transportation Needs (04/13/2024)   PRAPARE - Administrator, Civil Service (Medical):  No    Lack of Transportation (Non-Medical): No  Physical Activity: Sufficiently Active (04/13/2024)   Exercise Vital Sign    Days of Exercise per Week: 6 days    Minutes of Exercise per Session: 30 min  Stress: No Stress Concern Present (04/13/2024)   [employer] of Occupational Health - Occupational Stress Questionnaire    Feeling of Stress:  Not at all  Social Connections: Moderately Integrated (04/13/2024)   Social Connection and Isolation Panel    Frequency of Communication with Friends and Family: More than three times a week    Frequency of Social Gatherings with Friends and Family: More than three times a week    Attends Religious Services: More than 4 times per year    Active Member of [employer] or Organizations: Yes    Attends Banker Meetings: Never    Marital Status: Divorced     Vital Signs: There were no vitals taken for this visit.  Physical Exam   Patient is alert, oriented and able to participate fully in the conversation. No apparent discomfort or distress observed. He appears appropriately dressed.    Imaging: US  prostate 06/20/23      CTA Pelvis 04/22/24 IMPRESSION: VASCULAR 1. Severe aortoiliac peripheral arterial disease with extensive and irregular/ulcerated fibrofatty atherosclerotic plaque resulting in significant narrowing of the distal aorta, the left common iliac artery and the mid aspect of the right external iliac artery. 2. Both internal iliac arteries remain patent although there is some stenosis of the origin of the anterior division on the left. 3. Right prostatic artery appears to arise proximally from the anterior division. 4. On the left, there appear to be 2 prostatic arteries, 1 arising from a common trunk with the superior vesicular artery and the second arising just distal to this trunk from the anterior division. This second branch may only supply the seminal vesicles. 5. Increased arterial enhancement along the left  wall of the rectum with a prominent middle rectal artery. Suspect possible cross collateral flow between the prostatic arteries and middle rectal artery in this region. Attention at the time of angiography if attempted.  NON-VASCULAR 1. Diffuse chronic bladder wall thickening with Foley catheter in place. 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 2. Additional ancillary findings as above.    CTA Abdomen 04/28/24 IMPRESSION: VASCULAR: Occlusion of the left common iliac artery with reconstitution of flow in the internal iliac artery. Stenosis of the right common iliac artery. Stenosis of the distal aorta at the bifurcation.  NON-VASCULAR: Bilateral adrenal nodules. Recommend adrenal protocol CT. Right kidney cysts.    PAE (05/26/24)        Labs:  CBC: Recent Labs    07/25/23 0722 09/15/23 0920 05/26/24 0754 05/27/24 0730  WBC  --  7.2 5.8 13.0*  HGB 14.3 14.0 13.2 14.9  HCT 42.0 40.7 39.9 43.8  PLT  --  195 242 345    COAGS: Recent Labs    05/26/24 0754 05/27/24 0730  INR 1.1 1.1    BMP: Recent Labs    09/15/23 0920 04/27/24 1519 05/26/24 0754 05/27/24 0730  NA 139 145* 137 139  K 2.8* 4.0 3.8 4.0  CL 104 106 104 104  CO2 20* 21 23 20*  GLUCOSE 156* 100* 121* 188*  BUN 17 9 14 13   CALCIUM  9.4 9.5 9.3 9.9  CREATININE 1.29* 1.05 1.13 1.32*  GFRNONAA >60  --  >60 >60    LIVER FUNCTION TESTS: Recent Labs    09/15/23 0920 04/27/24 1519 05/27/24 0730  BILITOT 0.9 0.5 0.8  AST 25 22 32  ALT 31 33 38  ALKPHOS 59 70 70  PROT 8.1 6.8 7.8  ALBUMIN 4.2 4.4 4.0    Assessment and Plan: 64 year old male with a history of prostatic hyperplasia and prostate cancer status post external beam radiation therapy (November 2024) complicated by post-radiation urinary  retention requiring indwelling foley catheterization. He has failed multiple voiding trials and medical management. 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 Thankfully he is now catheter free, however has urinary frequency and incomplete emptying.  Plan for IR clinic follow up in 3 months.  No additional imaging required.    Ester Sides, MD Pager: [phone]    I spent a total of 25 Minutes in virtual video clinical consultation, greater than 50% of which was counseling/coordinating care for benign prostatic hyperplasia.

## 2024-07-12 ENCOUNTER — Inpatient Hospital Stay
Admission: RE | Admit: 2024-07-12 | Discharge: 2024-07-12 | Disposition: A | Discharge status: Home / Self Care | Source: Ambulatory Visit | Attending: Interventional Radiology | Admitting: Interventional Radiology

## 2024-07-12 DIAGNOSIS — N401 Enlarged prostate with lower urinary tract symptoms: Secondary | ICD-10-CM

## 2024-07-12 HISTORY — PX: IR RADIOLOGIST EVAL & MGMT: IMG5224

## 2024-07-19 ENCOUNTER — Encounter: Payer: Self-pay | Admitting: Internal Medicine

## 2024-07-25 LAB — COLOGUARD

## 2024-08-23 LAB — COLOGUARD: COLOGUARD: POSITIVE — AB

## 2024-09-13 ENCOUNTER — Other Ambulatory Visit: Payer: Self-pay | Admitting: Family Medicine

## 2024-09-13 DIAGNOSIS — E1159 Type 2 diabetes mellitus with other circulatory complications: Secondary | ICD-10-CM

## 2024-09-13 DIAGNOSIS — G4709 Other insomnia: Secondary | ICD-10-CM

## 2024-10-04 ENCOUNTER — Ambulatory Visit: Payer: Self-pay | Admitting: *Deleted

## 2024-10-04 NOTE — Telephone Encounter (Signed)
   FYI Only or Action Required?: Action required by provider: request for appointment, referral request, update on patient condition, and recommended mobile bus today .  Patient was last seen in primary care on 06/22/2024 by Newlin, Enobong, MD.  Called Nurse Triage reporting Urinary Frequency.  Symptoms began several months ago.  Interventions attempted: Rest, hydration, or home remedies.  Symptoms are: gradually worsening.  Triage Disposition: See HCP Within 4 Hours (Or PCP Triage)  Patient/caregiver understands and will follow disposition?: Yes   Please advise patient if urology referral placed. No available appt with PCP or other providers.                 Copied from CRM [id number]. Topic: Clinical - Red Word Triage >> Oct 04, 2024 10:54 AM Antony RAMAN wrote: Red Word that prompted transfer to Nurse Triage: worsening symptoms, weak stream and urgency to urinate, has had prostate cancer before Reason for Disposition  Diabetes mellitus or weak immune system (e.g., HIV positive, cancer chemo, splenectomy, organ transplant, chronic steroids)  Answer Assessment - Initial Assessment Questions No available appt today or tomorrow. Recommended mobile bus today and gave address. Patient requesting if PCP will do another urology referral.       1. SEVERITY: How bad is the pain?  (e.g., Scale 1-10; mild, moderate, or severe)     No pain  2. FREQUENCY: How many times have you had painful urination today?      None  3. PATTERN: Is pain present every time you urinate or just sometimes?      Stream is weaker than normal 4. ONSET: When did the painful urination start?      Stream weaker , urgency severe, frequent urination.  5. FEVER: Do you have a fever? If Yes, ask: What is your temperature, how was it measured, and when did it start?     No  6. PAST UTI: Have you had a urine infection before? If Yes, ask: When was the last time? and What happened that  time?      Yes several months  7. CAUSE: What do you think is causing the painful urination?      na 8. OTHER SYMPTOMS: Do you have any other symptoms? (e.g., flank pain, penis discharge, scrotal pain, blood in urine)     Urination urgency frequency and difficulty starting  stream. Starts and stops. No pain . Cloudy urine and odor noted. Dribbles at times. No fever, no blood in urine no pain. No scrotal pain or swelling. No pain in abdomen or back.  Protocols used: Urination Pain - Male-A-AH

## 2024-10-04 NOTE — Telephone Encounter (Signed)
 Noted

## 2024-10-06 ENCOUNTER — Encounter: Payer: Self-pay | Admitting: Internal Medicine

## 2024-10-06 ENCOUNTER — Telehealth: Payer: Self-pay

## 2024-10-06 NOTE — Telephone Encounter (Signed)
 RN attempted to call patient to schedule colonoscopy-5 year recall. No answer. Unable to LVM due to mailbox being full.

## 2024-10-22 ENCOUNTER — Other Ambulatory Visit: Payer: Self-pay | Admitting: Family Medicine

## 2024-10-22 DIAGNOSIS — K219 Gastro-esophageal reflux disease without esophagitis: Secondary | ICD-10-CM

## 2024-10-22 DIAGNOSIS — E1169 Type 2 diabetes mellitus with other specified complication: Secondary | ICD-10-CM

## 2024-10-29 ENCOUNTER — Telehealth: Payer: Self-pay | Admitting: Family Medicine

## 2024-10-29 ENCOUNTER — Other Ambulatory Visit: Payer: Self-pay | Admitting: Interventional Radiology

## 2024-10-29 DIAGNOSIS — N401 Enlarged prostate with lower urinary tract symptoms: Secondary | ICD-10-CM

## 2024-10-29 NOTE — Telephone Encounter (Signed)
 Pt confirmed appt

## 2024-11-01 ENCOUNTER — Encounter: Payer: Self-pay | Admitting: Family Medicine

## 2024-11-01 ENCOUNTER — Ambulatory Visit: Attending: Family Medicine | Admitting: Family Medicine

## 2024-11-01 VITALS — BP 133/74 | HR 94 | Temp 98.1°F | Ht 67.0 in | Wt 167.4 lb

## 2024-11-01 DIAGNOSIS — M1A0710 Idiopathic chronic gout, right ankle and foot, without tophus (tophi): Secondary | ICD-10-CM | POA: Diagnosis not present

## 2024-11-01 DIAGNOSIS — N401 Enlarged prostate with lower urinary tract symptoms: Secondary | ICD-10-CM | POA: Diagnosis not present

## 2024-11-01 DIAGNOSIS — E1169 Type 2 diabetes mellitus with other specified complication: Secondary | ICD-10-CM | POA: Diagnosis not present

## 2024-11-01 DIAGNOSIS — G4709 Other insomnia: Secondary | ICD-10-CM

## 2024-11-01 DIAGNOSIS — C61 Malignant neoplasm of prostate: Secondary | ICD-10-CM

## 2024-11-01 DIAGNOSIS — F17210 Nicotine dependence, cigarettes, uncomplicated: Secondary | ICD-10-CM

## 2024-11-01 DIAGNOSIS — I10 Essential (primary) hypertension: Secondary | ICD-10-CM | POA: Diagnosis not present

## 2024-11-01 DIAGNOSIS — I152 Hypertension secondary to endocrine disorders: Secondary | ICD-10-CM

## 2024-11-01 DIAGNOSIS — E785 Hyperlipidemia, unspecified: Secondary | ICD-10-CM

## 2024-11-01 LAB — POCT GLYCOSYLATED HEMOGLOBIN (HGB A1C): HbA1c, POC (controlled diabetic range): 6.4 % (ref 0.0–7.0)

## 2024-11-01 MED ORDER — CARVEDILOL 12.5 MG PO TABS: 12.5000 mg | ORAL_TABLET | Freq: Two times a day (BID) | ORAL | 1 refills | Status: AC

## 2024-11-01 MED ORDER — ALLOPURINOL 100 MG PO TABS: 100.0000 mg | ORAL_TABLET | Freq: Every day | ORAL | 1 refills | Status: AC

## 2024-11-01 MED ORDER — METFORMIN HCL ER 500 MG PO TB24: 500.0000 mg | ORAL_TABLET | Freq: Every day | ORAL | 1 refills | Status: AC

## 2024-11-01 MED ORDER — ATORVASTATIN CALCIUM 80 MG PO TABS: 80.0000 mg | ORAL_TABLET | Freq: Every day | ORAL | 1 refills | Status: AC

## 2024-11-01 MED ORDER — TAMSULOSIN HCL 0.4 MG PO CAPS: 0.8000 mg | ORAL_CAPSULE | Freq: Every day | ORAL | 1 refills | Status: AC

## 2024-11-01 MED ORDER — TRAZODONE HCL 50 MG PO TABS: 50.0000 mg | ORAL_TABLET | Freq: Every day | ORAL | 1 refills | Status: AC

## 2024-11-01 MED ORDER — VALSARTAN-HYDROCHLOROTHIAZIDE 320-25 MG PO TABS: 1.0000 | ORAL_TABLET | Freq: Every day | ORAL | 1 refills | Status: AC

## 2024-11-01 MED ORDER — AMLODIPINE BESYLATE 10 MG PO TABS: 10.0000 mg | ORAL_TABLET | Freq: Every day | ORAL | 1 refills | Status: AC

## 2024-11-01 MED ORDER — BUPROPION HCL ER (SR) 150 MG PO TB12: 150.0000 mg | ORAL_TABLET | Freq: Two times a day (BID) | ORAL | 1 refills | Status: AC

## 2024-11-01 NOTE — Progress Notes (Addendum)
 "  Subjective:  Patient ID: Henry Walker, male    DOB: [birth year]  Age: 64 y.o. MRN: [id number]  CC: Medical Management of Chronic Issues (Discuss prostate/Discuss increase in Wellbutrin )     Discussed the use of AI scribe software for clinical note transcription with the patient, who gave verbal consent to proceed.  History of Present Illness Henry Walker is a 64 year old male with  a history of type 2 diabetes mellitus , hypertension, hyperlipidemia, GERD, gout, emphysema and tobacco abuse (0.5 ppd since he was 14)  prostate cancer status post gold seed implant who presents with urinary complications following prostate artery embolization  who presents with urinary urgency and discomfort.  He reports persistent urinary urgency with a sudden need to void, small stream, dribbling, and burning despite prior prostate embolization. He has urgency incontinence, uses adult undergarments, and has needed to change clothes after episodes. Nocturia occurs three to four times nightly and disrupts sleep. He has pelvic pain, worse with urination.  He had prostate cancer treated with radiation and had severe pain and obstruction after prior Foley catheter removals that required emergency catheter reinsertion.  He is currently on Flomax  and is wondering if the dose can be increased.  He is unsure of his next follow-up with urology.  He takes trazodone  for sleep, Protonix  for reflux, and bupropion  for smoking cessation. He smokes half a pack per day and wants to increase his bupropion  dose to help him quit.  He mentions weight loss from 170 to 167 pounds due to reduced appetite and eating two meals daily.  Review of his chart indicates a 4 pound weight gain in the last 4 months.  His diabetes is controlled with a recent A1c of 6.4.  He has gout controlled with daily medication and no recent flares.  He recently had a positive colonoscopy screening with polyps and is planned for follow-up  colonoscopy later this month.    Past Medical History:  Diagnosis Date   Benign localized prostatic hyperplasia with lower urinary tract symptoms (LUTS)    Chronic gout    ED (erectile dysfunction)    Emphysema of lung (HCC)    Full dentures    GERD (gastroesophageal reflux disease)    Hx of adenomatous colonic polyps 10/06/2014   Hyperlipidemia    Hypertension    Malignant neoplasm prostate (HCC) 05/2023   urologist--- dr pace/  radiation onologist--- dr patrcia;  dx 08/ 2024, gleason 3 +4   Type 2 diabetes mellitus (HCC)    followed by pcp;    (07-17-2023  per pt checks blood sugar every other day)    Past Surgical History:  Procedure Laterality Date   COLONOSCOPY WITH PROPOFOL   05/19/2019   dr avram   GOLD SEED IMPLANT N/A 07/25/2023   Procedure: GOLD SEED IMPLANT;  Surgeon: Selma Donnice SAUNDERS, MD;  Location: [facility];  Service: Urology;  Laterality: N/A;   IR ANGIOGRAM PELVIS SELECTIVE OR SUPRASELECTIVE  05/26/2024   IR EMBO ARTERIAL NOT HEMORR HEMANG INC GUIDE ROADMAPPING  05/26/2024   IR EMBO ARTERIAL NOT HEMORR HEMANG INC GUIDE ROADMAPPING  05/26/2024   IR RADIOLOGIST EVAL & MGMT  04/21/2024   IR RADIOLOGIST EVAL & MGMT  04/23/2024   IR RADIOLOGIST EVAL & MGMT  05/07/2024   IR RADIOLOGIST EVAL & MGMT  07/12/2024   IR US  GUIDE VASC ACCESS LEFT  05/26/2024   IR US  GUIDE VASC ACCESS LEFT  05/26/2024   IR US  GUIDE VASC  ACCESS LEFT  05/26/2024   IR US  GUIDE VASC ACCESS RIGHT  05/26/2024   POSTERIOR FUSION LUMBAR SPINE  09/03/2006   @[facility]  by dr christella. gust;  laminectomy/  disectomy/ fusion L5--S1   SPACE OAR INSTILLATION N/A 07/25/2023   Procedure: SPACE OAR INSTILLATION;  Surgeon: Selma Donnice SAUNDERS, MD;  Location: [facility];  Service: Urology;  Laterality: N/A;    Family History  Problem Relation Age of Onset   Hypertension Mother    Cirrhosis Father    Hypertension Sister    Stroke Brother    Colon cancer Neg Hx    Esophageal cancer Neg Hx     Rectal cancer Neg Hx    Stomach cancer Neg Hx    Colon polyps Neg Hx     Social History   Socioeconomic History   Marital status: Single    Spouse name: Not on file   Number of children: Not on file   Years of education: Not on file   Highest education level: Not on file  Occupational History   Not on file  Tobacco Use   Smoking status: Every Day    Current packs/day: 0.50    Types: Cigarettes   Smokeless tobacco: Never   Tobacco comments:    07-17-2023  per pt smoked 1/2 ppd,  smoked since age 11  Vaping Use   Vaping status: Never Used  Substance and Sexual Activity   Alcohol use: No    Alcohol/week: 0.0 standard drinks of alcohol   Drug use: Never   Sexual activity: Not on file  Other Topics Concern   Not on file  Social History Narrative   Retired customer service manager (gas, mining engineer, cable)   Social Drivers of Health   Tobacco Use: High Risk (11/01/2024)   Patient History    Smoking Tobacco Use: Every Day    Smokeless Tobacco Use: Never    Passive Exposure: Not on file  Financial Resource Strain: Medium Risk (04/13/2024)   Overall Financial Resource Strain (CARDIA)    Difficulty of Paying Living Expenses: Somewhat hard  Food Insecurity: No Food Insecurity (04/13/2024)   Epic    Worried About Programme Researcher, Broadcasting/film/video in the Last Year: Never true    Ran Out of Food in the Last Year: Never true  Transportation Needs: No Transportation Needs (04/13/2024)   Epic    Lack of Transportation (Medical): No    Lack of Transportation (Non-Medical): No  Physical Activity: Sufficiently Active (04/13/2024)   Exercise Vital Sign    Days of Exercise per Week: 6 days    Minutes of Exercise per Session: 30 min  Stress: No Stress Concern Present (04/13/2024)   [employer] of Occupational Health - Occupational Stress Questionnaire    Feeling of Stress: Not at all  Social Connections: Moderately Integrated (04/13/2024)   Social Connection and Isolation Panel    Frequency of  Communication with Friends and Family: More than three times a week    Frequency of Social Gatherings with Friends and Family: More than three times a week    Attends Religious Services: More than 4 times per year    Active Member of Clubs or Organizations: Yes    Attends Banker Meetings: Never    Marital Status: Divorced  Depression (PHQ2-9): Low Risk (06/22/2024)   Depression (PHQ2-9)    PHQ-2 Score: 4  Alcohol Screen: Low Risk (04/13/2024)   Alcohol Screen    Last Alcohol Screening Score (AUDIT): 0  Housing: Low  Risk (04/13/2024)   Epic    Unable to Pay for Housing in the Last Year: No    Number of Times Moved in the Last Year: 0    Homeless in the Last Year: No  Utilities: Not At Risk (04/13/2024)   Epic    Threatened with loss of utilities: No  Health Literacy: Adequate Health Literacy (09/22/2023)   B1300 Health Literacy    Frequency of need for help with medical instructions: Never    Allergies[1]  Outpatient Medications Prior to Visit  Medication Sig Dispense Refill   albuterol  (VENTOLIN  HFA) 108 (90 Base) MCG/ACT inhaler Inhale 2 puffs into the lungs every 6 (six) hours as needed for wheezing or shortness of breath. 8 g 2   Blood Glucose Monitoring Suppl (ONE TOUCH ULTRA 2) w/Device KIT Use as directed 3 times daily before meals. 1 kit 0   cetirizine  (ZYRTEC  ALLERGY) 10 MG tablet Take 1 tablet (10 mg total) by mouth daily. 30 tablet 11   glucose blood (ONETOUCH ULTRA) test strip Use as instructed 3 times daily 100 each 12   Lancet Devices (ACCU-CHEK SOFTCLIX) lancets Use as instructed 1 each 0   Lancets (ONETOUCH ULTRASOFT) lancets Use as instructed 3 times daily 100 each 12   pantoprazole  (PROTONIX ) 40 MG tablet TAKE 1 TABLET(40 MG) BY MOUTH DAILY 28 each 1   potassium chloride  SA (KLOR-CON  M) 20 MEQ tablet Take 2 tablets (40 mEq total) by mouth daily. 14 tablet 0   terbinafine  (LAMISIL ) 250 MG tablet Take 1 tablet (250 mg total) by mouth daily. 90 tablet 0    allopurinol  (ZYLOPRIM ) 100 MG tablet Take 1 tablet (100 mg total) by mouth daily. 90 tablet 1   amLODipine  (NORVASC ) 10 MG tablet Take 1 tablet (10 mg total) by mouth daily. 90 tablet 1   atorvastatin  (LIPITOR) 80 MG tablet Take 1 tablet (80 mg total) by mouth daily. 90 tablet 1   buPROPion  (WELLBUTRIN  XL) 150 MG 24 hr tablet Take 1 tablet (150 mg total) by mouth daily. For smoking cessation 90 tablet 1   carvedilol  (COREG ) 12.5 MG tablet TAKE 1 TABLET(12.5 MG) BY MOUTH TWICE DAILY WITH A MEAL 180 tablet 1   fluticasone  (FLONASE ) 50 MCG/ACT nasal spray Place 2 sprays into both nostrils daily. (Patient taking differently: Place 2 sprays into both nostrils daily as needed for allergies.) 16 g 6   metFORMIN  (GLUCOPHAGE -XR) 500 MG 24 hr tablet TAKE 1 TABLET(500 MG) BY MOUTH DAILY WITH BREAKFAST 30 tablet 0   tamsulosin  (FLOMAX ) 0.4 MG CAPS capsule Take 1 capsule (0.4 mg total) by mouth daily after supper. 30 capsule 5   traZODone  (DESYREL ) 50 MG tablet TAKE 1 TABLET(50 MG) BY MOUTH AT BEDTIME 90 tablet 1   valsartan -hydrochlorothiazide  (DIOVAN -HCT) 320-25 MG tablet Take 1 tablet by mouth daily. 90 tablet 1   phenazopyridine  (PYRIDIUM ) 200 MG tablet Take 200 mg by mouth 3 (three) times daily as needed for pain. (Patient not taking: Reported on 11/01/2024)     ciprofloxacin  (CIPRO ) 500 MG tablet Take 1 tablet (500 mg total) by mouth 2 (two) times daily. (Patient not taking: Reported on 11/01/2024) 20 tablet 0   oxyCODONE  (OXY IR/ROXICODONE ) 5 MG immediate release tablet Take 1-2 tablets (5-10 mg total) by mouth every 6 (six) hours as needed for severe pain (pain score 7-10). (Patient not taking: Reported on 11/01/2024) 45 tablet 0   No facility-administered medications prior to visit.     ROS Review of Systems  Constitutional:  Negative for activity change and appetite change.  HENT:  Negative for sinus pressure and sore throat.   Respiratory:  Negative for chest tightness, shortness of breath and  wheezing.   Cardiovascular:  Negative for chest pain and palpitations.  Gastrointestinal:  Negative for abdominal distention, abdominal pain and constipation.  Genitourinary:  Positive for dysuria.  Musculoskeletal: Negative.   Psychiatric/Behavioral:  Negative for behavioral problems and dysphoric mood.     Objective:  BP 133/74   Pulse 94   Temp 98.1 F (36.7 C) (Oral)   Ht 5' 7 (1.702 m)   Wt 167 lb 6.4 oz (75.9 kg)   SpO2 95%   BMI 26.22 kg/m      11/01/2024    2:03 PM 06/22/2024    2:23 PM 06/21/2024   10:49 AM  BP/Weight  Systolic BP 133 114 115  Diastolic BP 74 66 74  Wt. (Lbs) 167.4 163.8 163  BMI 26.22 kg/m2 25.65 kg/m2 25.53 kg/m2      Physical Exam Constitutional:      Appearance: He is well-developed.  Cardiovascular:     Rate and Rhythm: Normal rate.     Heart sounds: Normal heart sounds. No murmur heard. Pulmonary:     Effort: Pulmonary effort is normal.     Breath sounds: Normal breath sounds. No wheezing or rales.  Chest:     Chest wall: No tenderness.  Abdominal:     General: Bowel sounds are normal. There is no distension.     Palpations: Abdomen is soft. There is no mass.     Tenderness: There is no abdominal tenderness (slight pelvic TTP).  Musculoskeletal:        General: Normal range of motion.     Right lower leg: No edema.     Left lower leg: No edema.  Neurological:     Mental Status: He is alert and oriented to person, place, and time.  Psychiatric:        Mood and Affect: Mood normal.        Latest Ref Rng & Units 05/27/2024    7:30 AM 05/26/2024    7:54 AM 04/27/2024    3:19 PM  CMP  Glucose 70 - 99 mg/dL 811  878  899   BUN 8 - 23 mg/dL 13  14  9    Creatinine 0.61 - 1.24 mg/dL 8.67  8.86  8.94   Sodium 135 - 145 mmol/L 139  137  145   Potassium 3.5 - 5.1 mmol/L 4.0  3.8  4.0   Chloride 98 - 111 mmol/L 104  104  106   CO2 22 - 32 mmol/L 20  23  21    Calcium  8.9 - 10.3 mg/dL 9.9  9.3  9.5   Total Protein 6.5 - 8.1 g/dL 7.8    6.8   Total Bilirubin 0.0 - 1.2 mg/dL 0.8   0.5   Alkaline Phos 38 - 126 U/L 70   70   AST 15 - 41 U/L 32   22   ALT 0 - 44 U/L 38   33     Lipid Panel     Component Value Date/Time   CHOL 132 02/07/2022 1425   TRIG 89 02/07/2022 1425   HDL 29 (L) 02/07/2022 1425   CHOLHDL 4.6 02/07/2022 1425   CHOLHDL 5.9 (H) 01/05/2016 1155   VLDL 30 01/05/2016 1155   LDLCALC 86 02/07/2022 1425    CBC    Component Value Date/Time   WBC  13.0 (H) 05/27/2024 0730   RBC 5.42 05/27/2024 0730   HGB 14.9 05/27/2024 0730   HGB 15.1 02/07/2022 1425   HCT 43.8 05/27/2024 0730   HCT 44.9 02/07/2022 1425   PLT 345 05/27/2024 0730   PLT 315 02/07/2022 1425   MCV 80.8 05/27/2024 0730   MCV 81 02/07/2022 1425   MCH 27.5 05/27/2024 0730   MCHC 34.0 05/27/2024 0730   RDW 14.9 05/27/2024 0730   RDW 13.7 02/07/2022 1425   LYMPHSABS 1.9 05/27/2024 0730   LYMPHSABS 3.8 (H) 02/07/2022 1425   MONOABS 1.0 05/27/2024 0730   EOSABS 0.1 05/27/2024 0730   EOSABS 0.2 02/07/2022 1425   BASOSABS 0.0 05/27/2024 0730   BASOSABS 0.1 02/07/2022 1425    Lab Results  Component Value Date   HGBA1C 6.4 11/01/2024   Lab Results  Component Value Date   HGBA1C 6.4 11/01/2024   HGBA1C 6.4 04/27/2024   HGBA1C 6.9 09/22/2023        Assessment & Plan BPH with obstructive symptoms Symptoms suggest urinary retention and bladder irritation. Current Flomax  dose may be insufficient. - Increased Flomax  to 0.8 mg daily (two capsules of 0.4 mg each). - Will need to monitor symptoms for urinary retention for which he will need to present to the ED   Malignant neoplasm of prostate - Status post gold seed implant -Status post prostate artery embolization - Currently has obstructive symptoms - Flomax  dose has been increased - Advised to follow-up with urologist   Type 2 diabetes mellitus with other specified complication A1c is well-controlled at 6.4, indicating good glycemic management. - Continue current  regimen -Counseled on Diabetic diet, the healthy plate, 849 minutes of moderate intensity exercise/week Blood sugar logs with fasting goals of 80-120 mg/dl, random of less than 819 and in the event of sugars less than 60 mg/dl or greater than 599 mg/dl encouraged to notify the clinic. Advised on the need for annual eye exams, annual foot exams, Pneumonia vaccine.   Hyperlipidemia associated with type 2 diabetes mellitus Cholesterol levels have not been checked recently. - Ordered fasting lipid panel. - Continue statin  Chronic idiopathic gout involving right foot and ankle No current gout flare-ups reported. -Continue current regimen  Hypertension associated with type 2 diabetes mellitus Blood pressure is well-controlled with current medication regimen. -Counseled on blood pressure goal of less than 130/80, low-sodium, DASH diet, medication compliance, 150 minutes of moderate intensity exercise per week. Discussed medication compliance, adverse effects.   Nicotine dependence, ongoing tobacco use Continues to smoke half a pack of cigarettes daily. Current bupropion  regimen may be insufficient for smoking cessation. - Increased bupropion  to twice daily. - Encouraged smoking cessation to reduce health risks.  Other insomnia Continues to use trazodone  for sleep. - Refilled trazodone  prescription.  General health maintenance Due for a CT scan of the lungs due to smoking history. Colonoscopy scheduled due to positive Cologuard test and history of polyps. Smoking greater than 20-pack-year history- Ordered CT scan of the lungs after January 20th, 2026. - Proceed with scheduled colonoscopy on January 20th, 2026.      Meds ordered this encounter  Medications   metFORMIN  (GLUCOPHAGE -XR) 500 MG 24 hr tablet    Sig: Take 1 tablet (500 mg total) by mouth daily with breakfast.    Dispense:  90 tablet    Refill:  1   tamsulosin  (FLOMAX ) 0.4 MG CAPS capsule    Sig: Take 2 capsules (0.8  mg total) by mouth daily after supper.  Dispense:  180 capsule    Refill:  1    Dose increase   buPROPion  (WELLBUTRIN  SR) 150 MG 12 hr tablet    Sig: Take 1 tablet (150 mg total) by mouth 2 (two) times daily. For smoking cessation    Dispense:  180 tablet    Refill:  1    Dose changed   allopurinol  (ZYLOPRIM ) 100 MG tablet    Sig: Take 1 tablet (100 mg total) by mouth daily.    Dispense:  90 tablet    Refill:  1   amLODipine  (NORVASC ) 10 MG tablet    Sig: Take 1 tablet (10 mg total) by mouth daily.    Dispense:  90 tablet    Refill:  1   atorvastatin  (LIPITOR) 80 MG tablet    Sig: Take 1 tablet (80 mg total) by mouth daily.    Dispense:  90 tablet    Refill:  1   carvedilol  (COREG ) 12.5 MG tablet    Sig: Take 1 tablet (12.5 mg total) by mouth 2 (two) times daily with a meal.    Dispense:  180 tablet    Refill:  1   traZODone  (DESYREL ) 50 MG tablet    Sig: Take 1 tablet (50 mg total) by mouth at bedtime.    Dispense:  90 tablet    Refill:  1   valsartan -hydrochlorothiazide  (DIOVAN -HCT) 320-25 MG tablet    Sig: Take 1 tablet by mouth daily.    Dispense:  90 tablet    Refill:  1    Follow-up: Return in about 6 months (around 05/01/2025) for Chronic medical conditions.       Corrina Sabin, MD, FAAFP. [facility] [place], [place] [phone]   11/01/2024, 6:14 PM     [1] No Known Allergies  "

## 2024-11-01 NOTE — Patient Instructions (Signed)
 VISIT SUMMARY:  Today, we discussed your ongoing urinary symptoms, diabetes management, smoking cessation, and general health maintenance. We reviewed your medications and made some adjustments to better manage your conditions.  YOUR PLAN:  -LOWER URINARY TRACT SYMPTOMS AFTER PROSTATE CANCER TREATMENT: Your symptoms suggest urinary retention and bladder irritation, which can occur after prostate cancer treatment. We have increased your Flomax  dose to 0.8 mg daily (two capsules of 0.4 mg each) to help alleviate these symptoms. Please avoid pain medications that may cause urinary retention.  -TYPE 2 DIABETES MELLITUS WITH OTHER SPECIFIED COMPLICATION: Your diabetes is well-controlled with a recent A1c of 6.4, indicating good blood sugar management. No changes to your current regimen are needed.  -HYPERLIPIDEMIA: Hyperlipidemia means you have high cholesterol levels. We have ordered a fasting lipid panel to check your cholesterol levels since they have not been checked recently.  -CHRONIC IDIOPATHIC GOUT INVOLVING RIGHT FOOT AND ANKLE: Gout is a type of arthritis that causes painful inflammation in the joints. Your gout is currently well-controlled with your daily medication, and you have not reported any recent flare-ups.  -HYPERTENSION: Hypertension means high blood pressure. Your blood pressure is well-controlled with your current medication regimen, so no changes are needed.  -NICOTINE DEPENDENCE, ONGOING TOBACCO USE: Nicotine dependence means you are addicted to tobacco. You continue to smoke half a pack of cigarettes daily. We have increased your bupropion  to twice daily to help you quit smoking. Quitting smoking will significantly reduce your health risks.  -OTHER INSOMNIA: Insomnia means you have trouble sleeping. You are currently using trazodone  for sleep, and we have refilled your prescription.  -GENERAL HEALTH MAINTENANCE: For your general health, you are due for a CT scan of the lungs  because of your smoking history. You also have a scheduled colonoscopy on January 20th, 2026, due to a positive Cologuard test and history of polyps.  INSTRUCTIONS:  Please follow up with the fasting lipid panel and proceed with the scheduled colonoscopy on January 20th, 2026. Additionally, schedule a CT scan of the lungs after January 20th, 2026.

## 2024-11-02 ENCOUNTER — Ambulatory Visit: Admitting: *Deleted

## 2024-11-02 VITALS — Ht 67.0 in | Wt 165.0 lb

## 2024-11-02 DIAGNOSIS — Z860100 Personal history of colon polyps, unspecified: Secondary | ICD-10-CM

## 2024-11-02 MED ORDER — NA SULFATE-K SULFATE-MG SULF 17.5-3.13-1.6 GM/177ML PO SOLN: 1.0000 | Freq: Once | ORAL | 0 refills | Status: AC

## 2024-11-02 NOTE — Progress Notes (Signed)
 Pt's name and DOB verified at the beginning of the pre-visit with 2 identifiers  Permission given to speak with  Pt denies any difficulty with ambulating,sitting, laying down or rolling side to side  Pt has no issues moving head neck or swallowing  No egg or soy allergy known to patient   No issues known to pt with past sedation  No FH of Malignant Hyperthermia  Pt is not on home 02   Pt is not on blood thinners   Pt denies issues with constipation   Pt is not on dialysis  Pt denise any abnormal heart rhythms   Pt denies any upcoming cardiac testing  Patient's chart reviewed by Norleen Schillings CNRA prior to pre-visit and patient appropriate for the [facility].  Pre-visit completed and red dot placed by patient's name on their procedure day (on provider's schedule).    Chart not reviewed by CRNA prior to [facility]  Visit in person  Pt states weight is 165 lb    Pt given  both [facility] main # and MD on call # prior to instructions.  Informed pt to come in at the time discussed and is shown on PV instructions.  Pt instructed to use Singlecare.com or GoodRx for a price reduction on prep  Instructed pt where to find PV instructions in My Chart and given to pt . Instructed pt on all aspects of written instructions including med holds clothing to wear and foods to eat and not eat as well as after procedure legal restrictions and to call MD on call if needed.. Pt states understanding. Instructed pt to review instructions again prior to procedure and call main # given if has any questions or any issues. Pt states they will.

## 2024-11-08 ENCOUNTER — Encounter: Payer: Self-pay | Admitting: Internal Medicine

## 2024-11-15 NOTE — Progress Notes (Unsigned)
 [facility] History and Physical   Primary Care Physician:  Delbert Clam, MD   Reason for Procedure:    Encounter Diagnosis  Name Primary?   Hx of colonic polyps Yes     Plan:    colonoscopy   The patient was provided an opportunity to ask questions and all were answered. The patient agreed with the plan.   HPI: Henry Walker is a 64 y.o. male s/p removal of colon polyps as listed below. Also has a + Cologuard ordered by PCP and it was +   09/2014 - 5 adenomas max 15 mm 05/19/2019 2 diminutive polyps removed - hyperplastic sigmoid Repeat 2025 Past Medical History:  Diagnosis Date   Benign localized prostatic hyperplasia with lower urinary tract symptoms (LUTS)    Chronic gout    ED (erectile dysfunction)    Emphysema of lung (HCC)    Full dentures    GERD (gastroesophageal reflux disease)    Hx of adenomatous colonic polyps 10/06/2014   Hyperlipidemia    Hypertension    Malignant neoplasm prostate (HCC) 05/2023   urologist--- dr pace/  radiation onologist--- dr patrcia;  dx 08/ 2024, gleason 3 +4   Type 2 diabetes mellitus (HCC)    followed by pcp;    (07-17-2023  per pt checks blood sugar every other day)    Past Surgical History:  Procedure Laterality Date   COLONOSCOPY WITH PROPOFOL   05/19/2019   dr avram   GOLD SEED IMPLANT N/A 07/25/2023   Procedure: GOLD SEED IMPLANT;  Surgeon: Selma Donnice SAUNDERS, MD;  Location: [facility];  Service: Urology;  Laterality: N/A;   IR ANGIOGRAM PELVIS SELECTIVE OR SUPRASELECTIVE  05/26/2024   IR EMBO ARTERIAL NOT HEMORR HEMANG INC GUIDE ROADMAPPING  05/26/2024   IR EMBO ARTERIAL NOT HEMORR HEMANG INC GUIDE ROADMAPPING  05/26/2024   IR RADIOLOGIST EVAL & MGMT  04/21/2024   IR RADIOLOGIST EVAL & MGMT  04/23/2024   IR RADIOLOGIST EVAL & MGMT  05/07/2024   IR RADIOLOGIST EVAL & MGMT  07/12/2024   IR US  GUIDE VASC ACCESS LEFT  05/26/2024   IR US  GUIDE VASC ACCESS LEFT  05/26/2024   IR US  GUIDE VASC  ACCESS LEFT  05/26/2024   IR US  GUIDE VASC ACCESS RIGHT  05/26/2024   POSTERIOR FUSION LUMBAR SPINE  09/03/2006   @[facility]  by dr christella. gust;  laminectomy/  disectomy/ fusion L5--S1   SPACE OAR INSTILLATION N/A 07/25/2023   Procedure: SPACE OAR INSTILLATION;  Surgeon: Selma Donnice SAUNDERS, MD;  Location: [facility];  Service: Urology;  Laterality: N/A;     Current Outpatient Medications  Medication Sig Dispense Refill   allopurinol  (ZYLOPRIM ) 100 MG tablet Take 1 tablet (100 mg total) by mouth daily. 90 tablet 1   amLODipine  (NORVASC ) 10 MG tablet Take 1 tablet (10 mg total) by mouth daily. 90 tablet 1   atorvastatin  (LIPITOR) 80 MG tablet Take 1 tablet (80 mg total) by mouth daily. 90 tablet 1   Blood Glucose Monitoring Suppl (ONE TOUCH ULTRA 2) w/Device KIT Use as directed 3 times daily before meals. 1 kit 0   buPROPion  (WELLBUTRIN  SR) 150 MG 12 hr tablet Take 1 tablet (150 mg total) by mouth 2 (two) times daily. For smoking cessation 180 tablet 1   carvedilol  (COREG ) 12.5 MG tablet Take 1 tablet (12.5 mg total) by mouth 2 (two) times daily with a meal. 180 tablet 1   glucose blood (ONETOUCH ULTRA) test strip Use  as instructed 3 times daily 100 each 12   Lancet Devices (ACCU-CHEK SOFTCLIX) lancets Use as instructed 1 each 0   Lancets (ONETOUCH ULTRASOFT) lancets Use as instructed 3 times daily 100 each 12   metFORMIN  (GLUCOPHAGE -XR) 500 MG 24 hr tablet Take 1 tablet (500 mg total) by mouth daily with breakfast. 90 tablet 1   potassium chloride  SA (KLOR-CON  M) 20 MEQ tablet Take 2 tablets (40 mEq total) by mouth daily. 14 tablet 0   tamsulosin  (FLOMAX ) 0.4 MG CAPS capsule Take 2 capsules (0.8 mg total) by mouth daily after supper. 180 capsule 1   terbinafine  (LAMISIL ) 250 MG tablet Take 1 tablet (250 mg total) by mouth daily. 90 tablet 0   traZODone  (DESYREL ) 50 MG tablet Take 1 tablet (50 mg total) by mouth at bedtime. 90 tablet 1   albuterol  (VENTOLIN  HFA) 108 (90 Base) MCG/ACT inhaler  Inhale 2 puffs into the lungs every 6 (six) hours as needed for wheezing or shortness of breath. 8 g 2   cetirizine  (ZYRTEC  ALLERGY) 10 MG tablet Take 1 tablet (10 mg total) by mouth daily. (Patient not taking: Reported on 11/16/2024) 30 tablet 11   pantoprazole  (PROTONIX ) 40 MG tablet TAKE 1 TABLET(40 MG) BY MOUTH DAILY (Patient taking differently: as needed.) 28 each 1   phenazopyridine  (PYRIDIUM ) 200 MG tablet Take 200 mg by mouth 3 (three) times daily as needed for pain. (Patient not taking: Reported on 11/01/2024)     valsartan -hydrochlorothiazide  (DIOVAN -HCT) 320-25 MG tablet Take 1 tablet by mouth daily. (Patient taking differently: Take 1 tablet by mouth as needed.) 90 tablet 1   Current Facility-Administered Medications  Medication Dose Route Frequency Provider Last Rate Last Admin   0.9 %  sodium chloride  infusion  500 mL Intravenous Once Avram Lupita BRAVO, MD        Allergies as of 11/16/2024   (No Known Allergies)    Family History  Problem Relation Age of Onset   Hypertension Mother    Cirrhosis Father    Hypertension Sister    Stroke Brother    Colon cancer Neg Hx    Esophageal cancer Neg Hx    Rectal cancer Neg Hx    Stomach cancer Neg Hx    Colon polyps Neg Hx     Social History   Socioeconomic History   Marital status: Single    Spouse name: Not on file   Number of children: Not on file   Years of education: Not on file   Highest education level: Not on file  Occupational History   Not on file  Tobacco Use   Smoking status: Every Day    Current packs/day: 0.50    Types: Cigarettes   Smokeless tobacco: Never   Tobacco comments:    07-17-2023  per pt smoked 1/2 ppd,  smoked since age 38  Vaping Use   Vaping status: Never Used  Substance and Sexual Activity   Alcohol use: No    Alcohol/week: 0.0 standard drinks of alcohol   Drug use: Never   Sexual activity: Not on file  Other Topics Concern   Not on file  Social History Narrative   Retired therapist, art (gas, mining engineer, cable)   Social Drivers of Health   Tobacco Use: High Risk (11/16/2024)   Patient History    Smoking Tobacco Use: Every Day    Smokeless Tobacco Use: Never    Passive Exposure: Not on file  Financial Resource Strain: Medium Risk (04/13/2024)   Overall  Financial Resource Strain (CARDIA)    Difficulty of Paying Living Expenses: Somewhat hard  Food Insecurity: No Food Insecurity (04/13/2024)   Epic    Worried About Programme Researcher, Broadcasting/film/video in the Last Year: Never true    Ran Out of Food in the Last Year: Never true  Transportation Needs: No Transportation Needs (04/13/2024)   Epic    Lack of Transportation (Medical): No    Lack of Transportation (Non-Medical): No  Physical Activity: Sufficiently Active (04/13/2024)   Exercise Vital Sign    Days of Exercise per Week: 6 days    Minutes of Exercise per Session: 30 min  Stress: No Stress Concern Present (04/13/2024)   [employer] of Occupational Health - Occupational Stress Questionnaire    Feeling of Stress: Not at all  Social Connections: Moderately Integrated (04/13/2024)   Social Connection and Isolation Panel    Frequency of Communication with Friends and Family: More than three times a week    Frequency of Social Gatherings with Friends and Family: More than three times a week    Attends Religious Services: More than 4 times per year    Active Member of [employer] or Organizations: Yes    Attends Banker Meetings: Never    Marital Status: Divorced  Catering Manager Violence: Not At Risk (04/13/2024)   Epic    Fear of Current or Ex-Partner: No    Emotionally Abused: No    Physically Abused: No    Sexually Abused: No  Depression (PHQ2-9): Low Risk (06/22/2024)   Depression (PHQ2-9)    PHQ-2 Score: 4  Alcohol Screen: Low Risk (04/13/2024)   Alcohol Screen    Last Alcohol Screening Score (AUDIT): 0  Housing: Low Risk (04/13/2024)   Epic    Unable to Pay for Housing in the Last Year: No    Number of  Times Moved in the Last Year: 0    Homeless in the Last Year: No  Utilities: Not At Risk (04/13/2024)   Epic    Threatened with loss of utilities: No  Health Literacy: Adequate Health Literacy (09/22/2023)   B1300 Health Literacy    Frequency of need for help with medical instructions: Never    Review of Systems:  All other review of systems negative except as mentioned in the HPI.  Physical Exam: Vital signs BP (!) 145/78   Pulse 81   Temp 97.9 F (36.6 C) (Temporal)   Ht 5' 7 (1.702 m)   Wt 165 lb (74.8 kg)   SpO2 97%   BMI 25.84 kg/m   General:   Alert,  Well-developed, well-nourished, pleasant and cooperative in NAD Lungs:  Clear throughout to auscultation.   Heart:  Regular rate and rhythm; no murmurs, clicks, rubs,  or gallops. Abdomen:  Soft, nontender and nondistended. Normal bowel sounds.   Neuro/Psych:  Alert and cooperative. Normal mood and affect. A and O x 3   @Laraina Sulton  CHARLENA Commander, MD, [facility] [phone] (pager) 11/16/2024 11:52 AM@

## 2024-11-16 ENCOUNTER — Encounter: Payer: Self-pay | Admitting: Internal Medicine

## 2024-11-16 ENCOUNTER — Ambulatory Visit (AMBULATORY_SURGERY_CENTER): Admitting: Internal Medicine

## 2024-11-16 VITALS — BP 132/71 | HR 71 | Temp 97.9°F | Resp 23 | Ht 67.0 in | Wt 165.0 lb

## 2024-11-16 DIAGNOSIS — Z1211 Encounter for screening for malignant neoplasm of colon: Secondary | ICD-10-CM | POA: Diagnosis not present

## 2024-11-16 DIAGNOSIS — Z860101 Personal history of adenomatous and serrated colon polyps: Secondary | ICD-10-CM | POA: Diagnosis not present

## 2024-11-16 DIAGNOSIS — K648 Other hemorrhoids: Secondary | ICD-10-CM | POA: Diagnosis not present

## 2024-11-16 DIAGNOSIS — Z860102 Personal history of hyperplastic colon polyps: Secondary | ICD-10-CM | POA: Diagnosis not present

## 2024-11-16 DIAGNOSIS — D125 Benign neoplasm of sigmoid colon: Secondary | ICD-10-CM

## 2024-11-16 DIAGNOSIS — K5730 Diverticulosis of large intestine without perforation or abscess without bleeding: Secondary | ICD-10-CM | POA: Diagnosis not present

## 2024-11-16 DIAGNOSIS — K5520 Angiodysplasia of colon without hemorrhage: Secondary | ICD-10-CM | POA: Diagnosis not present

## 2024-11-16 DIAGNOSIS — K31819 Angiodysplasia of stomach and duodenum without bleeding: Secondary | ICD-10-CM | POA: Insufficient documentation

## 2024-11-16 DIAGNOSIS — D123 Benign neoplasm of transverse colon: Secondary | ICD-10-CM | POA: Diagnosis not present

## 2024-11-16 DIAGNOSIS — Z860100 Personal history of colon polyps, unspecified: Secondary | ICD-10-CM

## 2024-11-16 MED ORDER — SODIUM CHLORIDE 0.9 % IV SOLN: 500.0000 mL | Freq: Once | INTRAVENOUS | Status: DC

## 2024-11-16 NOTE — Patient Instructions (Addendum)
 I found and removed 2 polyps.  They look benign I will let you know what they were and when to repeat a colonoscopy.  As we discussed, you should do colonoscopy and no stool tests like Cologuard.  You also have diverticulosis, please read the handout.  Hemorrhoids also seen.  There is some mild change in the rectum due to radiation where blood vessels grow on the surface.  This is common and you might see some bleeding from this or your hemorrhoids but it almost never leads to serious problems.  I appreciate the opportunity to care for you.  Connell Commander, MD, [facility]  Handouts given: Polyps, Hemorrhoids, Diverticulosis Resume previous diet. Continue present medications.  Await pathology results. Repeat colonoscopy is recommended for surveillance based on pathology results.  YOU HAD AN ENDOSCOPIC PROCEDURE TODAY AT THE [facility]:   Refer to the procedure report that was given to you for any specific questions about what was found during the examination.  If the procedure report does not answer your questions, please call your gastroenterologist to clarify.  If you requested that your care partner not be given the details of your procedure findings, then the procedure report has been included in a sealed envelope for you to review at your convenience later.  YOU SHOULD EXPECT: Some feelings of bloating in the abdomen. Passage of more gas than usual.  Walking can help get rid of the air that was put into your GI tract during the procedure and reduce the bloating. If you had a lower endoscopy (such as a colonoscopy or flexible sigmoidoscopy) you may notice spotting of blood in your stool or on the toilet paper. If you underwent a bowel prep for your procedure, you may not have a normal bowel movement for a few days.  Please Note:  You might notice some irritation and congestion in your nose or some drainage.  This is from the oxygen used during your procedure.  There is no need for  concern and it should clear up in a day or so.  SYMPTOMS TO REPORT IMMEDIATELY:  Following lower endoscopy (colonoscopy or flexible sigmoidoscopy):  Excessive amounts of blood in the stool  Significant tenderness or worsening of abdominal pains  Swelling of the abdomen that is new, acute  Fever of 100F or higher  For urgent or emergent issues, a gastroenterologist can be reached at any hour by calling [phone]. Do not use MyChart messaging for urgent concerns.    DIET:  We do recommend a small meal at first, but then you may proceed to your regular diet.  Drink plenty of fluids but you should avoid alcoholic beverages for 24 hours.  ACTIVITY:  You should plan to take it easy for the rest of today and you should NOT DRIVE or use heavy machinery until tomorrow (because of the sedation medicines used during the test).    FOLLOW UP: Our staff will call the number listed on your records the next business day following your procedure.  We will call around 7:15- 8:00 am to check on you and address any questions or concerns that you may have regarding the information given to you following your procedure. If we do not reach you, we will leave a message.     If any biopsies were taken you will be contacted by phone or by letter within the next 1-3 weeks.  Please call us  at [phone] if you have not heard about the biopsies in 3 weeks.  SIGNATURES/CONFIDENTIALITY: You and/or your care partner have signed paperwork which will be entered into your electronic medical record.  These signatures attest to the fact that that the information above on your After Visit Summary has been reviewed and is understood.  Full responsibility of the confidentiality of this discharge information lies with you and/or your care-partner.

## 2024-11-16 NOTE — Progress Notes (Signed)
 Sedate, gd SR, tolerated procedure well, VSS, report to RN

## 2024-11-16 NOTE — Op Note (Signed)
 [facility] Patient Name: Henry Walker Procedure Date: 11/16/2024 11:53 AM MRN: [id number] Endoscopist: Lupita FORBES Commander , MD, [id number] Age: 64 Referring MD:  Date of Birth: 16-Jan-1961 Gender: Male Account #: [id number] Procedure:                Colonoscopy Indications:              High risk colon cancer surveillance: Personal                            history of colonic polyps, Last colonoscopy: 2020 Medicines:                Monitored Anesthesia Care Procedure:                Pre-Anesthesia Assessment:                           - Prior to the procedure, a History and Physical                            was performed, and patient medications and                            allergies were reviewed. The patient's tolerance of                            previous anesthesia was also reviewed. The risks                            and benefits of the procedure and the sedation                            options and risks were discussed with the patient.                            All questions were answered, and informed consent                            was obtained. Prior Anticoagulants: The patient has                            taken no anticoagulant or antiplatelet agents. ASA                            Grade Assessment: III - A patient with severe                            systemic disease. After reviewing the risks and                            benefits, the patient was deemed in satisfactory                            condition to undergo the procedure.  After obtaining informed consent, the colonoscope                            was passed under direct vision. Throughout the                            procedure, the patient's blood pressure, pulse, and                            oxygen saturations were monitored continuously. The                            CF [id number] #[id number] was introduced through the anus                            and  advanced to the the cecum, identified by                            appendiceal orifice and ileocecal valve. The                            colonoscopy was performed without difficulty. The                            patient tolerated the procedure well. The quality                            of the bowel preparation was good. The ileocecal                            valve, appendiceal orifice, and rectum were                            photographed. The bowel preparation used was SUPREP                            via split dose instruction. Scope In: 12:04:22 PM Scope Out: 12:14:46 PM Scope Withdrawal Time: 0 hours 6 minutes 24 seconds  Total Procedure Duration: 0 hours 10 minutes 24 seconds  Findings:                 The perianal and digital rectal examinations were                            normal.                           Two pedunculated and sessile polyps were found in                            the sigmoid colon and transverse colon. The polyps                            were 4 to 7 mm in size. These polyps were removed  with a cold snare. Resection and retrieval were                            complete. Verification of patient identification                            for the specimen was done.                           Multiple diverticula were found in the sigmoid                            colon.                           Internal hemorrhoids were found.                           Multiple localized angioectasias were found in the                            rectum.                           The exam was otherwise without abnormality on                            direct and retroflexion views. Complications:            No immediate complications. Estimated Blood Loss:     Estimated blood loss was minimal. Impression:               - Two 4 to 7 mm polyps in the sigmoid colon and in                            the transverse colon, removed with a cold  snare.                            Resected and retrieved.                           - Diverticulosis in the sigmoid colon.                           - Internal hemorrhoids.                           - Multiple colonic angioectasias.                           - The examination was otherwise normal on direct                            and retroflexion views.                           - Personal history of colonic polyps.  09/2014 - 5 adenomas max 15 mm                           05/19/2019 2 diminutive polyps removed -                            hyperplastic sigmoid Recommendation:           - Patient has a contact number available for                            emergencies. The signs and symptoms of potential                            delayed complications were discussed with the                            patient. Return to normal activities tomorrow.                            Written discharge instructions were provided to the                            patient.                           - Continue present medications.                           - Await pathology results.                           - Repeat colonoscopy is recommended for                            surveillance. The colonoscopy date will be                            determined after pathology results from today's                            exam become available for review. Lupita FORBES Commander, MD 11/16/2024 12:27:37 PM This report has been signed electronically.

## 2024-11-16 NOTE — Progress Notes (Signed)
 Called to room to assist during endoscopic procedure.  Patient ID and intended procedure confirmed with present staff. Received instructions for my participation in the procedure from the performing physician.

## 2024-11-16 NOTE — Progress Notes (Signed)
 Post procedure, pt getting dressed in recovery room and had witnessed fall behind a curtain.  Pt fell to knees and used hands to brace fall - no impact to head.  Pt stated he did not hit his head.  No abrasions, swelling or injury noted.    Vital signs post fall - BP132/71 HR 79 and SpO2 97%  MD notified.

## 2024-11-16 NOTE — Progress Notes (Signed)
 Pt's states no medical or surgical changes since previsit or office visit.

## 2024-11-17 ENCOUNTER — Telehealth: Payer: Self-pay | Admitting: *Deleted

## 2024-11-17 NOTE — Telephone Encounter (Signed)
" °  Follow up Call-     11/16/2024   11:03 AM  Call back number  Post procedure Call Back phone  # [phone]  Permission to leave phone message Yes     Patient questions:  Do you have a fever, pain , or abdominal swelling? No. Pain Score  0 *  Have you tolerated food without any problems? Yes.    Have you been able to return to your normal activities? Yes.    Do you have any questions about your discharge instructions: Diet   No. Medications  No. Follow up visit  No.  Do you have questions or concerns about your Care? No.  Actions: * If pain score is 4 or above: No action needed, pain <4. Patient said no problems from falling to his knees in recovery room as well   "

## 2024-11-18 LAB — SURGICAL PATHOLOGY

## 2024-11-24 ENCOUNTER — Inpatient Hospital Stay
Admission: RE | Admit: 2024-11-24 | Discharge: 2024-11-24 | Disposition: A | Source: Ambulatory Visit | Attending: Family Medicine | Admitting: Family Medicine

## 2024-11-24 DIAGNOSIS — F17210 Nicotine dependence, cigarettes, uncomplicated: Secondary | ICD-10-CM

## 2024-11-29 ENCOUNTER — Ambulatory Visit: Payer: Self-pay | Admitting: Family Medicine

## 2024-11-30 ENCOUNTER — Ambulatory Visit: Payer: Self-pay | Admitting: Internal Medicine

## 2024-11-30 DIAGNOSIS — Z860101 Personal history of adenomatous and serrated colon polyps: Secondary | ICD-10-CM

## 2025-04-19 ENCOUNTER — Ambulatory Visit

## 2025-05-02 ENCOUNTER — Ambulatory Visit: Payer: Self-pay | Admitting: Family Medicine
